# Patient Record
Sex: Female | Born: 1935 | Race: White | Hispanic: No | Smoking: Former smoker
Health system: Southern US, Community
[De-identification: ages and names within clinical notes are randomized; demographics above are authoritative.]

## PROBLEM LIST (undated history)

## (undated) DIAGNOSIS — R739 Hyperglycemia, unspecified: Secondary | ICD-10-CM

## (undated) DIAGNOSIS — I739 Peripheral vascular disease, unspecified: Secondary | ICD-10-CM

## (undated) DIAGNOSIS — N83209 Unspecified ovarian cyst, unspecified side: Secondary | ICD-10-CM

## (undated) DIAGNOSIS — L989 Disorder of the skin and subcutaneous tissue, unspecified: Secondary | ICD-10-CM

## (undated) DIAGNOSIS — E079 Disorder of thyroid, unspecified: Secondary | ICD-10-CM

## (undated) DIAGNOSIS — D649 Anemia, unspecified: Secondary | ICD-10-CM

## (undated) DIAGNOSIS — Z9289 Personal history of other medical treatment: Secondary | ICD-10-CM

## (undated) DIAGNOSIS — K5900 Constipation, unspecified: Secondary | ICD-10-CM

## (undated) DIAGNOSIS — J40 Bronchitis, not specified as acute or chronic: Principal | ICD-10-CM

## (undated) DIAGNOSIS — R1900 Intra-abdominal and pelvic swelling, mass and lump, unspecified site: Secondary | ICD-10-CM

## (undated) DIAGNOSIS — E669 Obesity, unspecified: Secondary | ICD-10-CM

## (undated) DIAGNOSIS — I482 Chronic atrial fibrillation, unspecified: Secondary | ICD-10-CM

## (undated) DIAGNOSIS — R0902 Hypoxemia: Secondary | ICD-10-CM

## (undated) DIAGNOSIS — C4490 Unspecified malignant neoplasm of skin, unspecified: Secondary | ICD-10-CM

## (undated) DIAGNOSIS — M1990 Unspecified osteoarthritis, unspecified site: Secondary | ICD-10-CM

## (undated) DIAGNOSIS — E785 Hyperlipidemia, unspecified: Secondary | ICD-10-CM

## (undated) DIAGNOSIS — B019 Varicella without complication: Secondary | ICD-10-CM

## (undated) DIAGNOSIS — I10 Essential (primary) hypertension: Secondary | ICD-10-CM

## (undated) DIAGNOSIS — G629 Polyneuropathy, unspecified: Secondary | ICD-10-CM

## (undated) DIAGNOSIS — G4730 Sleep apnea, unspecified: Secondary | ICD-10-CM

## (undated) DIAGNOSIS — E0590 Thyrotoxicosis, unspecified without thyrotoxic crisis or storm: Secondary | ICD-10-CM

## (undated) DIAGNOSIS — Z0000 Encounter for general adult medical examination without abnormal findings: Principal | ICD-10-CM

## (undated) DIAGNOSIS — J984 Other disorders of lung: Secondary | ICD-10-CM

## (undated) DIAGNOSIS — S8012XA Contusion of left lower leg, initial encounter: Secondary | ICD-10-CM

## (undated) DIAGNOSIS — T462X5A Adverse effect of other antidysrhythmic drugs, initial encounter: Secondary | ICD-10-CM

## (undated) DIAGNOSIS — K219 Gastro-esophageal reflux disease without esophagitis: Secondary | ICD-10-CM

## (undated) HISTORY — DX: Disorder of the skin and subcutaneous tissue, unspecified: L98.9

## (undated) HISTORY — DX: Anemia, unspecified: D64.9

## (undated) HISTORY — DX: Contusion of left lower leg, initial encounter: S80.12XA

## (undated) HISTORY — DX: Chronic atrial fibrillation, unspecified: I48.20

## (undated) HISTORY — DX: Obesity, unspecified: E66.9

## (undated) HISTORY — DX: Other disorders of lung: J98.4

## (undated) HISTORY — DX: Unspecified malignant neoplasm of skin, unspecified: C44.90

## (undated) HISTORY — DX: Intra-abdominal and pelvic swelling, mass and lump, unspecified site: R19.00

## (undated) HISTORY — DX: Sleep apnea, unspecified: G47.30

## (undated) HISTORY — DX: Adverse effect of other antidysrhythmic drugs, initial encounter: T46.2X5A

## (undated) HISTORY — DX: Constipation, unspecified: K59.00

## (undated) HISTORY — PX: CATARACT EXTRACTION: SUR2

## (undated) HISTORY — DX: Hyperlipidemia, unspecified: E78.5

## (undated) HISTORY — DX: Unspecified ovarian cyst, unspecified side: N83.209

## (undated) HISTORY — PX: ABDOMINAL HYSTERECTOMY: SHX81

## (undated) HISTORY — DX: Thyrotoxicosis, unspecified without thyrotoxic crisis or storm: E05.90

## (undated) HISTORY — DX: Encounter for general adult medical examination without abnormal findings: Z00.00

## (undated) HISTORY — DX: Personal history of other medical treatment: Z92.89

## (undated) HISTORY — DX: Disorder of thyroid, unspecified: E07.9

## (undated) HISTORY — DX: Polyneuropathy, unspecified: G62.9

## (undated) HISTORY — DX: Essential (primary) hypertension: I10

## (undated) HISTORY — DX: Gastro-esophageal reflux disease without esophagitis: K21.9

## (undated) HISTORY — DX: Hyperglycemia, unspecified: R73.9

## (undated) HISTORY — DX: Hypoxemia: R09.02

## (undated) HISTORY — DX: Bronchitis, not specified as acute or chronic: J40

## (undated) HISTORY — DX: Peripheral vascular disease, unspecified: I73.9

## (undated) HISTORY — PX: SKIN BIOPSY: SHX1

## (undated) HISTORY — DX: Unspecified osteoarthritis, unspecified site: M19.90

## (undated) HISTORY — DX: Varicella without complication: B01.9

---

## 1999-07-10 ENCOUNTER — Other Ambulatory Visit: Admission: RE | Admit: 1999-07-10 | Discharge: 1999-07-10 | Payer: Self-pay | Admitting: Family Medicine

## 2001-10-28 HISTORY — PX: TOTAL HIP ARTHROPLASTY: SHX124

## 2002-06-10 ENCOUNTER — Encounter: Payer: Self-pay | Admitting: Orthopedic Surgery

## 2002-06-17 ENCOUNTER — Inpatient Hospital Stay (HOSPITAL_COMMUNITY): Admission: RE | Admit: 2002-06-17 | Discharge: 2002-06-22 | Payer: Self-pay | Admitting: Orthopedic Surgery

## 2005-06-12 ENCOUNTER — Ambulatory Visit: Payer: Self-pay | Admitting: Family Medicine

## 2005-11-25 ENCOUNTER — Ambulatory Visit: Payer: Self-pay | Admitting: Family Medicine

## 2005-12-17 ENCOUNTER — Ambulatory Visit: Payer: Self-pay | Admitting: Family Medicine

## 2005-12-26 ENCOUNTER — Ambulatory Visit: Payer: Self-pay | Admitting: Family Medicine

## 2006-02-26 ENCOUNTER — Ambulatory Visit: Payer: Self-pay | Admitting: Family Medicine

## 2006-12-09 ENCOUNTER — Ambulatory Visit: Payer: Self-pay | Admitting: Internal Medicine

## 2007-01-28 ENCOUNTER — Ambulatory Visit: Payer: Self-pay | Admitting: Family Medicine

## 2007-02-04 ENCOUNTER — Encounter: Admission: RE | Admit: 2007-02-04 | Discharge: 2007-02-04 | Payer: Self-pay | Admitting: Family Medicine

## 2007-02-04 ENCOUNTER — Ambulatory Visit: Payer: Self-pay | Admitting: Family Medicine

## 2007-02-04 LAB — CONVERTED CEMR LAB
Basophils Absolute: 0.1 10*3/uL (ref 0.0–0.1)
CO2: 27 meq/L (ref 19–32)
Chloride: 106 meq/L (ref 96–112)
HCT: 44.6 % (ref 36.0–46.0)
MCHC: 34.5 g/dL (ref 30.0–36.0)
Monocytes Relative: 7.7 % (ref 3.0–11.0)
Potassium: 3.8 meq/L (ref 3.5–5.1)
RBC: 4.86 M/uL (ref 3.87–5.11)
RDW: 12.1 % (ref 11.5–14.6)
Sodium: 142 meq/L (ref 135–145)

## 2009-02-09 ENCOUNTER — Ambulatory Visit: Payer: Self-pay | Admitting: Family Medicine

## 2009-02-09 DIAGNOSIS — D649 Anemia, unspecified: Secondary | ICD-10-CM | POA: Insufficient documentation

## 2009-02-09 DIAGNOSIS — E785 Hyperlipidemia, unspecified: Secondary | ICD-10-CM | POA: Insufficient documentation

## 2009-02-09 DIAGNOSIS — M1990 Unspecified osteoarthritis, unspecified site: Secondary | ICD-10-CM

## 2009-02-09 DIAGNOSIS — E0590 Thyrotoxicosis, unspecified without thyrotoxic crisis or storm: Secondary | ICD-10-CM

## 2009-02-09 DIAGNOSIS — E559 Vitamin D deficiency, unspecified: Secondary | ICD-10-CM

## 2009-02-09 DIAGNOSIS — L259 Unspecified contact dermatitis, unspecified cause: Secondary | ICD-10-CM

## 2009-02-09 DIAGNOSIS — T50995A Adverse effect of other drugs, medicaments and biological substances, initial encounter: Secondary | ICD-10-CM

## 2009-02-09 DIAGNOSIS — E669 Obesity, unspecified: Secondary | ICD-10-CM

## 2009-02-09 DIAGNOSIS — R350 Frequency of micturition: Secondary | ICD-10-CM

## 2009-02-09 HISTORY — DX: Thyrotoxicosis, unspecified without thyrotoxic crisis or storm: E05.90

## 2009-02-09 LAB — HM MAMMOGRAPHY

## 2009-02-15 ENCOUNTER — Telehealth: Payer: Self-pay | Admitting: Family Medicine

## 2009-02-15 LAB — CONVERTED CEMR LAB
ALT: 14 units/L (ref 0–35)
BUN: 14 mg/dL (ref 6–23)
CO2: 26 meq/L (ref 19–32)
Chloride: 106 meq/L (ref 96–112)
Direct LDL: 128.4 mg/dL
Eosinophils Relative: 0.9 % (ref 0.0–5.0)
Glucose, Bld: 106 mg/dL — ABNORMAL HIGH (ref 70–99)
HCT: 42.8 % (ref 36.0–46.0)
Lymphs Abs: 0.9 10*3/uL (ref 0.7–4.0)
MCV: 93.4 fL (ref 78.0–100.0)
Monocytes Absolute: 0.5 10*3/uL (ref 0.1–1.0)
Platelets: 164 10*3/uL (ref 150.0–400.0)
Potassium: 4.4 meq/L (ref 3.5–5.1)
RDW: 12.2 % (ref 11.5–14.6)
TSH: 2.55 microintl units/mL (ref 0.35–5.50)
Total Bilirubin: 0.8 mg/dL (ref 0.3–1.2)
WBC: 5.2 10*3/uL (ref 4.5–10.5)

## 2009-03-08 ENCOUNTER — Encounter: Payer: Self-pay | Admitting: Family Medicine

## 2010-01-10 ENCOUNTER — Ambulatory Visit: Payer: Self-pay | Admitting: Family Medicine

## 2010-01-10 DIAGNOSIS — R739 Hyperglycemia, unspecified: Secondary | ICD-10-CM | POA: Insufficient documentation

## 2010-01-10 DIAGNOSIS — K219 Gastro-esophageal reflux disease without esophagitis: Secondary | ICD-10-CM | POA: Insufficient documentation

## 2010-01-10 HISTORY — DX: Hyperglycemia, unspecified: R73.9

## 2010-01-15 LAB — CONVERTED CEMR LAB
BUN: 24 mg/dL — ABNORMAL HIGH (ref 6–23)
Basophils Absolute: 0.2 10*3/uL — ABNORMAL HIGH (ref 0.0–0.1)
Calcium: 9.8 mg/dL (ref 8.4–10.5)
Creatinine, Ser: 1.2 mg/dL (ref 0.4–1.2)
GFR calc non Af Amer: 45.48 mL/min (ref >60)
Glucose, Bld: 157 mg/dL — ABNORMAL HIGH (ref 70–99)
Hemoglobin: 14.9 g/dL (ref 12.0–15.0)
Hgb A1c MFr Bld: 5.9 % (ref 4.6–6.5)
Lymphocytes Relative: 17.4 % (ref 12.0–46.0)
Monocytes Relative: 6.3 % (ref 3.0–12.0)
Neutrophils Relative %: 73.6 % (ref 43.0–77.0)
Platelets: 206 10*3/uL (ref 150.0–400.0)
RDW: 12.6 % (ref 11.5–14.6)
Sodium: 142 meq/L (ref 135–145)

## 2010-03-23 ENCOUNTER — Ambulatory Visit: Payer: Self-pay | Admitting: Family Medicine

## 2010-03-23 DIAGNOSIS — M1990 Unspecified osteoarthritis, unspecified site: Secondary | ICD-10-CM

## 2010-03-23 DIAGNOSIS — N390 Urinary tract infection, site not specified: Secondary | ICD-10-CM | POA: Insufficient documentation

## 2010-03-23 LAB — CONVERTED CEMR LAB
Bilirubin Urine: NEGATIVE
Ketones, urine, test strip: NEGATIVE
Specific Gravity, Urine: <1.005

## 2010-03-24 ENCOUNTER — Encounter: Payer: Self-pay | Admitting: Family Medicine

## 2010-03-27 ENCOUNTER — Telehealth (INDEPENDENT_AMBULATORY_CARE_PROVIDER_SITE_OTHER): Payer: Self-pay

## 2010-04-04 ENCOUNTER — Telehealth: Payer: Self-pay | Admitting: Family Medicine

## 2010-04-05 ENCOUNTER — Ambulatory Visit: Payer: Self-pay | Admitting: Family Medicine

## 2010-04-05 DIAGNOSIS — N362 Urethral caruncle: Secondary | ICD-10-CM | POA: Insufficient documentation

## 2010-04-05 DIAGNOSIS — R1900 Intra-abdominal and pelvic swelling, mass and lump, unspecified site: Secondary | ICD-10-CM

## 2010-04-05 DIAGNOSIS — N898 Other specified noninflammatory disorders of vagina: Secondary | ICD-10-CM | POA: Insufficient documentation

## 2010-04-05 LAB — CONVERTED CEMR LAB
Bilirubin Urine: NEGATIVE
Glucose, Urine, Semiquant: NEGATIVE
Urobilinogen, UA: 1
pH: 6

## 2010-04-06 ENCOUNTER — Telehealth: Payer: Self-pay | Admitting: Family Medicine

## 2010-04-06 ENCOUNTER — Ambulatory Visit: Payer: Self-pay | Admitting: Family Medicine

## 2010-04-09 ENCOUNTER — Ambulatory Visit: Payer: Self-pay | Admitting: Cardiology

## 2010-04-10 LAB — CONVERTED CEMR LAB
BUN: 22 mg/dL (ref 6–23)
Calcium: 10.2 mg/dL (ref 8.4–10.5)
Creatinine, Ser: 0.9 mg/dL (ref 0.4–1.2)
Glucose, Bld: 94 mg/dL (ref 70–99)
Sodium: 145 meq/L (ref 135–145)

## 2010-04-11 LAB — CONVERTED CEMR LAB: Potassium: 5.5 meq/L — ABNORMAL HIGH (ref 3.5–5.1)

## 2010-04-12 ENCOUNTER — Telehealth: Payer: Self-pay | Admitting: Family Medicine

## 2010-04-16 ENCOUNTER — Ambulatory Visit: Payer: Self-pay | Admitting: Family Medicine

## 2010-05-07 ENCOUNTER — Telehealth: Payer: Self-pay | Admitting: Family Medicine

## 2010-05-09 ENCOUNTER — Ambulatory Visit: Admission: RE | Admit: 2010-05-09 | Discharge: 2010-05-09 | Payer: Self-pay | Admitting: Gynecology

## 2010-05-09 ENCOUNTER — Encounter: Payer: Self-pay | Admitting: Family Medicine

## 2010-05-10 ENCOUNTER — Ambulatory Visit (HOSPITAL_COMMUNITY): Admission: RE | Admit: 2010-05-10 | Discharge: 2010-05-10 | Payer: Self-pay | Admitting: Gynecology

## 2010-05-10 ENCOUNTER — Encounter: Payer: Self-pay | Admitting: Family Medicine

## 2010-05-16 ENCOUNTER — Telehealth: Payer: Self-pay | Admitting: Family Medicine

## 2010-08-14 ENCOUNTER — Inpatient Hospital Stay (HOSPITAL_COMMUNITY): Admission: EM | Admit: 2010-08-14 | Discharge: 2010-08-17 | Payer: Self-pay | Admitting: Emergency Medicine

## 2010-08-14 ENCOUNTER — Ambulatory Visit: Payer: Self-pay | Admitting: Family Medicine

## 2010-08-14 ENCOUNTER — Ambulatory Visit: Payer: Self-pay | Admitting: Internal Medicine

## 2010-08-14 ENCOUNTER — Encounter: Payer: Self-pay | Admitting: Family Medicine

## 2010-08-14 DIAGNOSIS — I4891 Unspecified atrial fibrillation: Secondary | ICD-10-CM

## 2010-08-15 ENCOUNTER — Encounter: Payer: Self-pay | Admitting: Cardiology

## 2010-08-30 ENCOUNTER — Encounter: Payer: Self-pay | Admitting: Cardiology

## 2010-08-30 ENCOUNTER — Ambulatory Visit: Payer: Self-pay | Admitting: Cardiology

## 2010-10-12 ENCOUNTER — Ambulatory Visit: Payer: Self-pay | Admitting: Cardiology

## 2010-10-12 ENCOUNTER — Encounter: Payer: Self-pay | Admitting: Cardiology

## 2010-10-15 ENCOUNTER — Telehealth: Payer: Self-pay | Admitting: Cardiology

## 2010-10-16 ENCOUNTER — Telehealth: Payer: Self-pay | Admitting: Cardiology

## 2010-10-16 LAB — CONVERTED CEMR LAB
AST: 19 units/L (ref 0–37)
Albumin: 4.1 g/dL (ref 3.5–5.2)
BUN: 22 mg/dL (ref 6–23)
Calcium: 9.1 mg/dL (ref 8.4–10.5)
Chloride: 103 meq/L (ref 96–112)
Creatinine, Ser: 0.97 mg/dL (ref 0.40–1.20)
Free T4: 1.39 ng/dL (ref 0.80–1.80)
Glucose, Bld: 82 mg/dL (ref 70–99)
Potassium: 4.9 meq/L (ref 3.5–5.3)
TSH: 3.229 microintl units/mL (ref 0.350–4.500)

## 2010-10-19 DIAGNOSIS — I498 Other specified cardiac arrhythmias: Secondary | ICD-10-CM

## 2010-10-26 ENCOUNTER — Telehealth (INDEPENDENT_AMBULATORY_CARE_PROVIDER_SITE_OTHER): Payer: Self-pay | Admitting: *Deleted

## 2010-10-31 ENCOUNTER — Ambulatory Visit
Admission: RE | Admit: 2010-10-31 | Discharge: 2010-10-31 | Payer: Self-pay | Source: Home / Self Care | Attending: Cardiology | Admitting: Cardiology

## 2010-10-31 ENCOUNTER — Encounter: Payer: Self-pay | Admitting: Cardiology

## 2010-11-06 ENCOUNTER — Telehealth: Payer: Self-pay | Admitting: Cardiology

## 2010-11-16 ENCOUNTER — Other Ambulatory Visit: Payer: Self-pay | Admitting: Gynecology

## 2010-11-16 DIAGNOSIS — N83201 Unspecified ovarian cyst, right side: Secondary | ICD-10-CM

## 2010-11-29 NOTE — Progress Notes (Signed)
----   Converted from flag ---- ---- 03/23/2010 11:55 AM, Raechel Ache, RN wrote: phone # next week for results [phone] ------------------------------

## 2010-11-29 NOTE — Procedures (Signed)
Summary: SUMMARY REPORT  SUMMARY REPORT   Imported By: Mirna Mires 11/06/2010 16:25:52  _____________________________________________________________________  External Attachment:    Type:   Image     Comment:   External Document

## 2010-11-29 NOTE — Progress Notes (Signed)
Summary: question re tylenol  Phone Note Call from Patient   Caller: Patient [phone] Reason for Call: Talk to Nurse Summary of Call: pt wants to know if ok to take tylenol Initial call taken by: Glynda Jaeger,  October 16, 2010 8:23 AM  Follow-up for Phone Call        yes. Follow-up by: Gaylord Shih, MD, [facility],  October 16, 2010 1:42 PM     Appended Document: question re tylenol Pt aware okay to take Tylenol. Mylo Red RN

## 2010-11-29 NOTE — Assessment & Plan Note (Signed)
Summary: BLADDER INFECTION? // RS   Vital Signs:  Patient profile:   77 year old female Weight:      213 pounds O2 Sat:      97124 % Temp:     98.1 degrees F Pulse rate:   60 / minute BP sitting:   124 / 86  (left arm)  Vitals Entered By: Pura Spice, RN (April 05, 2010 11:51 AM) CC: saw  small amt vaginal blood yest. UA otained today stated was treated by dr fry last month for UTI .      History of Present Illness: This 77 year old widow was seen by Dr. Clent Ridges last month or UTI as well as noticed some vaginal spotting at the time treated with Septra DS. Since that time she has had 2 episodes of vaginal spotting no pain but in to be examined for followup of urinary tract infection and cause of spotting Her leg she had cataract surgery one day ago and doing fine No other complaints Patient has mild pain medicines other than vitamins  Allergies (verified): No Known Drug Allergies  Past History:  Past Medical History: Last updated: 03/23/2010 Anemia-NOS GERD Hyperlipidemia Hypothyroidism Osteoarthritis hypoglycemia vitamin D deficiency  Past Surgical History: Last updated: 03/23/2010 Cataract extraction per Dr. Burgess Estelle  Review of Systems  The patient denies anorexia, fever, weight loss, weight gain, vision loss, decreased hearing, hoarseness, chest pain, syncope, dyspnea on exertion, peripheral edema, prolonged cough, headaches, hemoptysis, abdominal pain, melena, hematochezia, severe indigestion/heartburn, hematuria, incontinence, genital sores, muscle weakness, suspicious skin lesions, transient blindness, difficulty walking, depression, unusual weight change, abnormal bleeding, enlarged lymph nodes, angioedema, breast masses, and testicular masses.    Physical Exam  General:  Well-developed,well-nourished,in no acute distress; alert,appropriate and cooperative throughout examinationoverweight-appearing.   Lungs:  Normal respiratory effort, chest expands symmetrically.  Lungs are clear to auscultation, no crackles or wheezes. Heart:  Normal rate and regular rhythm. S1 and S2 normal without gallop, murmur, click, rub or other extra sounds. Abdomen:  Bowel sounds positive,abdomen soft and non-tender without masses, organomegaly or hernias noted. Rectal:  No external abnormalities noted. Normal sphincter tone. No rectal masses or tenderness. 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 14 cm No evidence of vaginal bleeding other than a caruncle of urethra   Impression & Recommendations:  Problem # 1:  URETHRAL CARUNCLE (ICD-599.3) Assessment New most likely source of bleeding, no treatment at this time  Problem # 2:  PELVIC MASS (ICD-789.30) Assessment: New approximate 10 x 14 cm mass right pelvis  Problem # 3:  UTI (ICD-599.0) Assessment: Improved  Orders: UA Dipstick w/o Micro (automated)  (81003) Venipuncture (16109) TLB-Renal Function Panel (80069-RENAL)  Problem # 4:  VAGINAL BLEEDING (ICD-623.8) Assessment: New spotting secondary to urethral caruncle  Complete Medication List: 1)  Multivitamins Tabs (Multiple vitamin) .... Once daily 2)  Adult Aspirin Low Strength 81 Mg Tbdp (Aspirin) 3)  Vitamin D 60454 Unit Caps (Ergocalciferol) .Marland Kitchen.. 1 by mouth weekly  Other Orders: TLB-Potassium (K+) (09811-B) Radiology Referral (Radiology)  Patient Instructions: 1)  urinary tract infection has improved and no further treatment is needed 2)  On pelvic examination you have a pelvic mass which will get a CT scan to determine further treatment 3)  Vaginal spotting was most likely due to the urethral  caruncle 4)  which needs no treatment at this time but will be evaluated by the gynecologist  Laboratory  Results   Urine Tests    Routine Urinalysis   Color: yellow Appearance: Clear Glucose: negative   (Normal Range: Negative) Bilirubin: negative    (Normal Range: Negative) Ketone: trace (5)   (Normal Range: Negative) Spec. Gravity: 1.020   (Normal Range: 1.003-1.035) Blood: negative   (Normal Range: Negative) pH: 6.0   (Normal Range: 5.0-8.0) Protein: negative   (Normal Range: Negative) Urobilinogen: 1.0   (Normal Range: 0-1) Nitrite: negative   (Normal Range: Negative) Leukocyte Esterace: trace   (Normal Range: Negative)    Comments: Rita Ohara  April 05, 2010 11:10 AM

## 2010-11-29 NOTE — Progress Notes (Signed)
Summary: wiped blood again  Phone Note Call from Patient Call back at Home Phone [phone]   Caller: vm Summary of Call: Antibiotic from Dr. Clent Ridges when Dr. Scotty Court out UTI/bladder infection, at that time, saw some blood when wiped,  finished Bactrim 04-02-10.  Had cataract surgery this am.  This am wiped blood again.  What to do? Initial call taken by: Rudy Jew, RN,  April 04, 2010 2:46 PM  Follow-up for Phone Call        Contacted pt and scheduled appt for pt to come in for evaluation... Pt adv that she wipes blood (small amt) in the morning and doesn't know if it is connected to the recent UTI / Bladder Infection that she  just finished taking abx for? Denies any other sxs.... Per Almira Coaster, RN, pt has been scheduled for appt with Dr Scotty Court on 04/05/2010 at 10:30am.  Follow-up by: Debbra Riding,  April 04, 2010 3:53 PM

## 2010-11-29 NOTE — Progress Notes (Signed)
Summary: Question about  medication  Phone Note Call from Patient Call back at Home Phone [phone]   Caller: Patient Summary of Call: Quiestion about medication making her dizzy Initial call taken by: Judie Grieve,  October 15, 2010 2:23 PM  Follow-up for Phone Call        spoke w/ pt and she adv that she started 12.5 metropolol two times a day this past Saturday. (note this is a decrease).  On Thursday morning while walking from car to get hair done she had this dizzy type feeling that lasted about an hour.  Then Friday night she got up to used the bathroom and had the same sensation and required her to hold onto the wall. This morning she had the sensation a little bit.  She is not overly concerned about this but wants the Doctor to be aware.  She has read that this sensation can be caused by inner ear and left her hearing aid out last week before this happened.  Encouraged pt to put it back in.  Adv pt that if this becomes more frequent to let us know and that Dr. Daleen Squibb will see this note and provide further guidance.  Follow-up by: Claris Gladden RN,  October 15, 2010 2:48 PM     Appended Document: Question about  medication continue to monitor. If continues will need 24 hr HM. Was bradycardic in office and I decreased her metoprolol.  Appended Document: Question about  medication I talked with Mrs. Gerlich and she felt dizzy most of the day today.  She also states she has a cold and feels achy all over.  She is aware it is okay to take Tylenol.  Reviewed orthostatic precautions with her as well as need to keep hydrated and eat small meals.  She is taking all of her medications.   She will call back later this week if dizziness persists.  I will also call her back this week and see how she is doing. Mylo Red RN  Appended Document: Question about  medication I called Mrs. Groseclose today and she states that "I'm not really feeling any better. I am ok sitting. When I stand to start  walking it gets better".  Discussed 24' Holter Monitor with patient.  She would like to wait until after Christmas as her family is in for the holidays.  Agreed and order placed. Mylo Red RN   Clinical Lists Changes  Problems: Added new problem of BRADYCARDIA (ICD-427.89) Orders: Added new Referral order of Holter Monitor (Holter Monitor) - Signed

## 2010-11-29 NOTE — Progress Notes (Signed)
Summary: pelvic mass and oncology referral  Phone Note Call from Patient   Caller: Patient Call For: Judithann Sheen MD Summary of Call: Pt is asking for directions and instructions about Dr. Loree Fee that she is to be referred to for pelvic mass???? [phone] Initial call taken by: Lynann Beaver CMA,  April 12, 2010 11:41 AM  Follow-up for Phone Call        Spoke with pt and she states that Dr. Scotty Court wanted a certain md. I couldn't find him in my book. So I told her Dr. Scotty Court is out of the office until Tues and that I would perfer him to do this since he wants a certain md. Pt agrees. Follow-up by: Romualdo Bolk, CMA Duncan Dull),  April 12, 2010 11:52 AM  Additional Follow-up for Phone Call Additional follow up Details #1::        Pt notified Dr Marjorie Smolder [facility]  Additional Follow-up by: Pura Spice, RN,  April 16, 2010 8:51 AM

## 2010-11-29 NOTE — Progress Notes (Signed)
Summary: Pt req to talk to Dr. Scotty Court re: Oncology Referral  Phone Note Call from Patient Call back at [phone] cell    Caller: Patient Summary of Call: Pt called and desperately needs to talk with Dr. Scotty Court re: Oncology referral. Pls call on pts cell phone.  [phone] cell .  Pts home # is order of order at this time.    Initial call taken by: Lucy Antigua,  May 07, 2010 10:05 AM  Follow-up for Phone Call        called pt and discussed  problem Follow-up by: Judithann Sheen MD,  May 16, 2010 8:29 AM

## 2010-11-29 NOTE — Assessment & Plan Note (Signed)
Summary: FATIGUE / BODY ACHES // RS   Vital Signs:  Patient profile:   77 year old female Weight:      213 pounds O2 Sat:      98 % Temp:     97.9 degrees F Pulse rate:   96 / minute BP sitting:   120 / 84  (left arm) Cuff size:   large  Vitals Entered By: Pura Spice, RN (January 10, 2010 3:23 PM) CC: not feeling weell reflux last hs gets "catch" in rt ankle thinks not adjusted to new hearing aid    History of Present Illness: Is a 53-year-old white female with normal blood pressures and complaining of not feeling well. She relates she had severe reflux last evening and that some of the past year she also states she gets some type of word pain in her right ankle but is of short duration and infrequent. She has recently gotten a new urinary aid for the right ear and is having some difficulty with the She relates no energy also states that she has had some pain in the right shoulder since there was worry as certain times cannot raise her arm but it is some better at this time She states she feels fine he at times and wonders if she has hypoglycemia so will check a blood sugar as well as a hemoglobin A1c the sensation of her low same he occurs after ingestion of some sweets as candy, possibly functional hypoglycemia  Allergies (verified): No Known Drug Allergies  Review of Systems  The patient denies anorexia, fever, weight loss, weight gain, vision loss, decreased hearing, hoarseness, chest pain, syncope, dyspnea on exertion, peripheral edema, prolonged cough, headaches, hemoptysis, abdominal pain, melena, hematochezia, severe indigestion/heartburn, hematuria, incontinence, genital sores, muscle weakness, suspicious skin lesions, transient blindness, difficulty walking, depression, unusual weight change, abnormal bleeding, enlarged lymph nodes, angioedema, breast masses, and testicular masses.    Physical Exam  General:  Well-developed,well-nourished,in no acute distress;  alert,appropriate and cooperative throughout examination Zylstra:  Normocephalic and atraumatic without obvious abnormalities. No apparent alopecia or balding. Eyes:  No corneal or conjunctival inflammation noted. EOMI. Perrla. Funduscopic exam benign, without hemorrhages, exudates or papilledema. Vision grossly normal. Ears:  hearing aid right ear left ear mold Nose:  External nasal examination shows no deformity or inflammation. Nasal mucosa are pink and moist without lesions or exudates. Mouth:  Oral mucosa and oropharynx without lesions or exudates.  Teeth in good repair. Lungs:  Normal respiratory effort, chest expands symmetrically. Lungs are clear to auscultation, no crackles or wheezes. Heart:  Normal rate and regular rhythm. S1 and S2 normal without gallop, murmur, click, rub or other extra sounds. Abdomen:  Bowel sounds positive,abdomen soft and non-tender without masses, organomegaly or hernias noted. Extremities:  No clubbing, cyanosis, edema, or deformity noted with normal full range of motion of all joints.   Neurologic:  No cranial nerve deficits noted. Station and gait are normal. Plantar reflexes are down-going bilaterally. DTRs are symmetrical throughout. Sensory, motor and coordinative functions appear intact.   Impression & Recommendations:  Problem # 1:  GERD (ICD-530.81) Assessment New  Her updated medication list for this problem includes:    Nexium 40 Mg Cpdr (Esomeprazole magnesium) ..... One q.d.  Problem # 2:  HYPOGLYCEMIA (ICD-251.2) Assessment: New  Orders: Venipuncture (16109) TLB-BMP (Basic Metabolic Panel-BMET) (80048-METABOL) TLB-A1C / Hgb A1C (Glycohemoglobin) (83036-A1C)  Problem # 3:  EXOGENOUS OBESITY (ICD-278.00) Assessment: Unchanged  Problem # 4:  ARTHRITIS (ICD-716.90) Assessment: Unchanged  Complete Medication List: 1)  Multivitamins Tabs (Multiple vitamin) .... Once daily 2)  Adult Aspirin Low Strength 81 Mg Tbdp (Aspirin) 3)  Vitamin D  50000 Unit Caps (Ergocalciferol) .Marland Kitchen.. 1 by mouth weekly 4)  Nexium 40 Mg Cpdr (Esomeprazole magnesium) .... One q.d.  Other Orders: T-Vitamin D (25-Hydroxy) [phone]) TLB-CBC Platelet - w/Differential (85025-CBCD) Prescription Created Electronically [phone])  Patient Instructions: 1)  plan to get laboratory studies 2)  Discussed importance of not ingesting candy or other sweets on i 3)  empty stomach 4)  Will check lab studies and call you when I receive the results Prescriptions: VITAMIN D 40102 UNIT CAPS (ERGOCALCIFEROL) 1 by mouth weekly  #12 x 3   Entered and Authorized by:   Judithann Sheen MD   Signed by:   Judithann Sheen MD on 01/10/2010   Method used:   [facility] to        [facility] [address] [phone]* (retail)       [address]       [place], [place]  [address]       Ph: [phone] or [phone]       Fax: [phone]   RxID:   [id number]

## 2010-11-29 NOTE — Progress Notes (Signed)
Summary: [facility] called re: order for potassium lvl  Phone Note From Other Clinic   Caller: [facility]             Summary of Call: Pt came to [facility] on Friday 04/06/10, stating she was to have potassium lvl checked per Dr. Scotty Court. There was no order found for this lab work to be done. Please advise.     Initial call taken by: Lucy Antigua,  April 06, 2010 8:34 AM  Follow-up for Phone Call        Per Dr. Scotty Court reorder potassium since it was elevated yesterday. Follow-up by: Trixie Dredge,  April 06, 2010 12:58 PM

## 2010-11-29 NOTE — Assessment & Plan Note (Signed)
Summary: UTI? // RS   Vital Signs:  Patient profile:   77 year old female Weight:      211 pounds BMI:     33.17 Temp:     97.5 degrees F oral Pulse rate:   52 / minute Pulse rhythm:   irregular BP sitting:   140 / 90  (left arm) Cuff size:   regular  Vitals Entered By: Raechel Ache, RN (Mar 23, 2010 9:28 AM) CC: C/o dysuria, urgency, saw blood in urine this morning.Planning on leaving town tomorrow.   History of Present Illness: Here for 2 days of urinary burning and urgency, along with some blood in the urine. No fever or nasuea or abdominal pain.   Allergies (verified): No Known Drug Allergies  Past History:  Past Medical History: Anemia-NOS GERD Hyperlipidemia Hypothyroidism Osteoarthritis hypoglycemia vitamin D deficiency  Past Surgical History: Cataract extraction per Dr. Burgess Estelle  Review of Systems  The patient denies anorexia, fever, weight loss, weight gain, vision loss, decreased hearing, hoarseness, chest pain, syncope, dyspnea on exertion, peripheral edema, prolonged cough, headaches, hemoptysis, abdominal pain, melena, hematochezia, severe indigestion/heartburn, hematuria, incontinence, genital sores, muscle weakness, suspicious skin lesions, transient blindness, difficulty walking, depression, unusual weight change, abnormal bleeding, enlarged lymph nodes, angioedema, breast masses, and testicular masses.    Physical Exam  General:  Well-developed,well-nourished,in no acute distress; alert,appropriate and cooperative throughout examination Lungs:  Normal respiratory effort, chest expands symmetrically. Lungs are clear to auscultation, no crackles or wheezes. Heart:  Normal rate and regular rhythm. S1 and S2 normal without gallop, murmur, click, rub or other extra sounds. Abdomen:  Bowel sounds positive,abdomen soft and non-tender without masses, organomegaly or hernias noted.   Impression & Recommendations:  Problem # 1:  UTI (ICD-599.0)  Her  updated medication list for this problem includes:    Macrobid 100 Mg Caps (Nitrofurantoin monohyd macro) .Marland Kitchen..Marland Kitchen Two times a day  Orders: Prescription Created Electronically [id number]) UA Dipstick w/o Micro (manual) (38756) T-Culture, Urine ([id number])  Complete Medication List: 1)  Multivitamins Tabs (Multiple vitamin) .... Once daily 2)  Adult Aspirin Low Strength 81 Mg Tbdp (Aspirin) 3)  Vitamin D 16606 Unit Caps (Ergocalciferol) .Marland Kitchen.. 1 by mouth weekly 4)  Macrobid 100 Mg Caps (Nitrofurantoin monohyd macro) .... Two times a day  Patient Instructions: 1)  Await culture results. Drink plenty of water Prescriptions: MACROBID 100 MG CAPS (NITROFURANTOIN MONOHYD MACRO) two times a day  #14 x 0   Entered and Authorized by:   Nelwyn Salisbury MD   Signed by:   Nelwyn Salisbury MD on 03/23/2010   Method used:   [facility]        [facility] [address] [phone]* (retail)       [address]       [place], [place]  [address]       Ph: [phone] or [phone]       Fax: [phone]   RxID:   [id number]   Laboratory Results   Urine Tests    Routine Urinalysis   Color: lt. yellow Appearance: Cloudy Glucose: negative   (Normal Range: Negative) Bilirubin: negative   (Normal Range: Negative) Ketone: negative   (Normal Range: Negative) Spec. Gravity: <1.005   (Normal Range: 1.003-1.035) Blood: large   (Normal Range: Negative) pH: 7.5   (Normal Range: 5.0-8.0) Protein: 30   (Normal Range: Negative) Urobilinogen: 0.2   (Normal Range: 0-1) Nitrite: negative   (Normal Range: Negative)  Leukocyte Esterace: negative   (Normal Range: Negative)

## 2010-11-29 NOTE — Progress Notes (Signed)
Summary: holter monitor  Phone Note Outgoing Call   Call placed by: Marcos Eke,  October 26, 2010 11:13 AM Action Taken: Appt scheduled Summary of Call: Patient has appt. for holter monitor on 1/4 at 12 noon

## 2010-11-29 NOTE — Assessment & Plan Note (Signed)
Summary: f4-6w/dfg   Visit Type:  1 mo f/u Primary Provider:  Judithann Sheen MD  CC:  edema/ankles...denies any other cardiac complaints today.  History of Present Illness: Mrs Gina Reilly comes in today for followup of her atrial fibrillation and bradycardia.  A last visit, her heart rate was in the high 40s. She was in sinus bradycardia. We decreased her metoprolol to 25 mg twice a day.  She still had a little bit of fatigue particularly she walks. She denies any dyspnea on exertion, palpitations, syncope or presyncope. She's had no bleeding. She has been on aspirin for a number of years for preventative reasons.  Current Medications (verified): 1)  Multivitamins  Tabs (Multiple Vitamin) .... Once Daily 2)  Aspirin 81 Mg Tbec (Aspirin) .... Take One Tablet By Mouth Daily 3)  Vitamin D 04540 Unit Caps (Ergocalciferol) .Marland Kitchen.. 1 By Mouth Weekly 4)  Amiodarone Hcl 200 Mg Tabs (Amiodarone Hcl) .Marland Kitchen.. 1 Tablet Daily 5)  Metoprolol Tartrate 25 Mg Tabs (Metoprolol Tartrate) .... Take One Tablet By Mouth Twice A Day 6)  Pradaxa 150 Mg Caps (Dabigatran Etexilate Mesylate) .Marland Kitchen.. 1 Cap Two Times A Day  Allergies (verified): No Known Drug Allergies  Past History:  Past Medical History: Last updated: 03/23/2010 Anemia-NOS GERD Hyperlipidemia Hypothyroidism Osteoarthritis hypoglycemia vitamin D deficiency  Past Surgical History: Last updated: 08/29/2010 Cataract extraction per Dr. Burgess Estelle Hip Arthroplasty-Total..2003  Family History: Last updated: 08/29/2010  Negative for atrial fibrillation.  She does have an   extensive history of stroke in her family.   Social History: Last updated: 08/29/2010  The patient lives at home alone by herself.  She has a   10 pack-year history of smoking but quit a long time ago.  She drinks  alcohol on very rare occasions approximately less than 1 time per month  Review of Systems       negative other than history of present illness  Vital  Signs:  Patient profile:   77 year old female Height:      67 inches Weight:      224 pounds BMI:     35.21 Pulse rate:   56 / minute Pulse rhythm:   irregular BP sitting:   150 / 90  (left arm) Cuff size:   large  Vitals Entered By: Danielle Rankin, CMA (October 12, 2010 4:19 PM)  Physical Exam  General:  obese.  no acute distress Eichler:  normocephalic and atraumatic Eyes:  PERRLA/EOM intact; conjunctiva and lids normal. Neck:  Neck supple, no JVD. No masses, thyromegaly or abnormal cervical nodes. Chest Wall:  no deformities or breast masses noted Lungs:  Clear bilaterally to auscultation and percussion. Heart:  PMI poorly appreciated, regular rate and rhythm, normal S1-S2, no carotid bruits Msk:  Back normal, normal gait. Muscle strength and tone normal. Pulses:  pulses normal in all 4 extremities Extremities:  No clubbing or cyanosis. Neurologic:  Alert and oriented x 3. Skin:  Intact without lesions or rashes. Psych:  Normal affect.   Impression & Recommendations:  Problem # 1:  ATRIAL FIBRILLATION (ICD-427.31) Assessment Improved She is still slow. We'll decrease her metoprolol to 12.5 mg twice a day. Continue amiodarone at 200 mg a day. We'll check amiodarone labs today.Followup in 3 months. We'll discontinue aspirin. The following medications were removed from the medication list:    Aspirin 81 Mg Tbec (Aspirin) .Marland Kitchen... Take one tablet by mouth daily Her updated medication list for this problem includes:    Amiodarone Hcl 200  Mg Tabs (Amiodarone hcl) .Marland Kitchen... 1 tablet daily    Metoprolol Tartrate 25 Mg Tabs (Metoprolol tartrate) .Marland Kitchen... Take one-half of a  tablet by mouth twice a day  Orders: T-TSH ([id number]) T-Comprehensive Metabolic Panel [phone]) T-T4, Free [phone])  Patient Instructions: 1)  Your physician recommends that you schedule a follow-up appointment in: 3 months with Dr. Daleen Squibb 2)  Your physician recommends that you have  lab work today:  cmp, tsh,  Free T4 3)  Your physician has recommended you make the following change in your medication:

## 2010-11-29 NOTE — Progress Notes (Signed)
  Phone Note Outgoing Call   Call placed by: Lisabeth Devoid RN,  November 06, 2010 10:41 AM Call placed to: Patient Summary of Call: holter monitor results  Follow-up for Phone Call        Pt will decrease Metoprolol to 12.5mg  qam. She is aware of Holter monitor results. Mylo Red RN Follow-up by: Lisabeth Devoid RN,  November 06, 2010 10:44 AM    New/Updated Medications: METOPROLOL TARTRATE 25 MG TABS (METOPROLOL TARTRATE) Take one-half of a  tablet by mouth every am

## 2010-11-29 NOTE — Assessment & Plan Note (Signed)
Summary: ACID REFLUX // RS   Vital Signs:  Patient profile:   77 year old female Height:      67 inches (170.18 cm) Weight:      218 pounds (99.09 kg) O2 Sat:      98 % on Room air Temp:     97.5 degrees F (36.39 degrees C) oral Pulse rate:   117 / minute BP sitting:   116 / 72  (left arm) Cuff size:   large  Vitals Entered By: Josph Macho RMA (August 14, 2010 3:24 PM)  O2 Flow:  Room air  Serial Vital Signs/Assessments:  Time      Position  BP       Pulse  Resp  Temp     By                              76                    Danise Edge MD                              145                   Danise Edge MD  CC: Acid reflux/ CF Is Patient Diabetic? No   History of Present Illness: Patient in today c/o worsening reflux symptoms. Last week she woke up with reflux symptoms last week and has changed from Rantidine to  Prilosec to improve her dyspepsia this week without any results. She describes it as he has burning in her throat and some belching. She denies actual chest pain. She denies feeling palpitations most of the time although she does note occasional sensation of her heart beating faster and does agree she felt that in the office today. She denies any recent illness, fevers, chills, nausea, vomiting, constipation, diarrhea. She does note her stools recently turned from a typical brown to more of a yellowish color but no other changes associated she is now experiencing anorexia, worsening shortness of breath, diaphoresis. She denies ever being told she was in an irregular rhythm before and has not had a cardiac Pap today  Current Medications (verified): 1)  Multivitamins  Tabs (Multiple Vitamin) .... Once Daily 2)  Adult Aspirin Low Strength 81 Mg Tbdp (Aspirin) 3)  Vitamin D 16109 Unit Caps (Ergocalciferol) .Marland Kitchen.. 1 By Mouth Weekly 4)  Prilosec .... As Directed  Allergies (verified): No Known Drug Allergies  Past History:  Past Medical History: Last updated:  03/23/2010 Anemia-NOS GERD Hyperlipidemia Hypothyroidism Osteoarthritis hypoglycemia vitamin D deficiency  Past Surgical History: Last updated: 03/23/2010 Cataract extraction per Dr. Burgess Estelle PMH-FH-SH reviewed-no changes except otherwise noted  Review of Systems      See HPI  Physical Exam  General:  Well-developed,well-nourished,in no acute distress; alert,appropriate and cooperative throughout examination [place]:  Normocephalic and atraumatic without obvious abnormalities. No apparent alopecia or balding. Mouth:  Oral mucosa and oropharynx without lesions or exudates.  Teeth in good repair. Lungs:  Normal respiratory effort, chest expands symmetrically. Lungs are clear to auscultation, no crackles or wheezes. Heart:  tachycardia, irregular rhythm, and grade  1/6 systolic murmur.   Abdomen:  Bowel sounds positive,abdomen soft and non-tender without masses, organomegaly or hernias noted. Extremities:  No clubbing, cyanosis, edema, or deformity noted with normal full range of motion of all joints.  Psych:  Cognition and judgment appear intact. Alert and cooperative with normal attention span and concentration. No apparent delusions, illusions, hallucinations   Impression & Recommendations:  Problem # 1:  ATRIAL FIBRILLATION (ICD-427.31)  Her updated medication list for this problem includes:    Adult Aspirin Low Strength 81 Mg Tbdp (Aspirin) Given her aspirin in the office since she did not take her usual dose today. She is presently asymptomatic but she is in Afib with RVR. She is sent to the ER at [facility] for further evaluation and rate control  Problem # 2:  GERD (ICD-530.81) Will reassess this after hospitalization. No change in meds at present.  Complete Medication List: 1)  Multivitamins Tabs (Multiple vitamin) .... Once daily 2)  Adult Aspirin Low Strength 81 Mg Tbdp (Aspirin) 3)  Vitamin D 16109 Unit Caps (Ergocalciferol) .Marland Kitchen.. 1 by mouth weekly 4)  Prilosec  .... As  directed  Appended Document: ACID REFLUX // RS    Clinical Lists Changes  Orders: Added new Service order of EKG w/ Interpretation (93000) - Signed

## 2010-11-29 NOTE — Assessment & Plan Note (Signed)
Summary: eph   Visit Type:  Initial Consult  CC:  "just tired".  History of Present Illness: Gina Reilly returns for followup of her new onset atrial fibrillation.  She had symptoms of palpitations when she presented. She underwent a transesophageal echocardiogram with cardioversion. She went right back into atrial fib. We loaded her on amiodarone. She is also on Pradaxa.  Her biggest complaint is lack of energy and stamina. She feels like she has led in her feet when she tries to walk. She has had no presyncope or syncope. She denies any bleeding or melena. She has not felt like she's been back in atrial fib  Current Medications (verified): 1)  Multivitamins  Tabs (Multiple Vitamin) .... Once Daily 2)  Aspirin 81 Mg Tbec (Aspirin) .... Take One Tablet By Mouth Daily 3)  Vitamin D 16109 Unit Caps (Ergocalciferol) .Marland Kitchen.. 1 By Mouth Weekly 4)  Prilosec Otc 20 Mg Tbec (Omeprazole Magnesium) .Marland Kitchen.. 1 Tab Once Daily As Needed 5)  Amiodarone Hcl 200 Mg Tabs (Amiodarone Hcl) .Marland Kitchen.. 1 Tab Two Times A Day 6)  Lopressor 50 Mg Tabs (Metoprolol Tartrate) .Marland Kitchen.. 1 Tab Two Times A Day 7)  Pradaxa 150 Mg Caps (Dabigatran Etexilate Mesylate) .Marland Kitchen.. 1 Cap Two Times A Day  Allergies: No Known Drug Allergies  Past History:  Past Medical History: Last updated: 03/23/2010 Anemia-NOS GERD Hyperlipidemia Hypothyroidism Osteoarthritis hypoglycemia vitamin D deficiency  Past Surgical History: Last updated: 08/29/2010 Cataract extraction per Dr. Burgess Estelle Hip Arthroplasty-Total..2003  Family History: Last updated: 08/29/2010  Negative for atrial fibrillation.  She does have an   extensive history of stroke in her family.   Social History: Last updated: 08/29/2010  The patient lives at home alone by herself.  She has a   10 pack-year history of smoking but quit a long time ago.  She drinks  alcohol on very rare occasions approximately less than 1 time per month  Review of Systems       negative other  than history of present illness  Vital Signs:  Patient profile:   77 year old female Height:      67 inches Weight:      218 pounds BMI:     34.27 Pulse rate:   52 / minute Pulse rhythm:   regular  Vitals Entered By: Jacquelin Hawking, CMA (August 30, 2010 3:51 PM)  Physical Exam  General:  obese.  no acute distress [place]les:  normocephalic and atraumatic Eyes:  PERRLA/EOM intact; conjunctiva and lids normal. Neck:  Neck supple, no JVD. No masses, thyromegaly or abnormal cervical nodes. Chest Wall:  no deformities or breast masses noted Lungs:  Clear bilaterally to auscultation and percussion. Heart:  PMI nondisplaced, regular rhythm, slow rate, no murmur, no bruits Msk:  Back normal, normal gait. Muscle strength and tone normal. Pulses:  pulses normal in all 4 extremities Extremities:  No clubbing or cyanosis. Neurologic:  Alert and oriented x 3. Skin:  Intact without lesions or rashes. Psych:  Normal affect.   Impression & Recommendations:  Problem # 1:  ATRIAL FIBRILLATION (ICD-427.31) Assessment Improved She has converted to sinus rhythm and now sinus bradycardia. I suspect she is having symptomatic bradycardia particular exercise. We will decrease her Lopressor to 25 mg twice a day and decrease her amiodarone to 200 mg a day. I'll see her back in about 4 weeks. No change in anticoagulation. Her updated medication list for this problem includes:    Aspirin 81 Mg Tbec (Aspirin) .Marland Kitchen... Take one tablet  by mouth daily    Amiodarone Hcl 200 Mg Tabs (Amiodarone hcl) .Marland Kitchen... 1 tablet daily    Metoprolol Tartrate 25 Mg Tabs (Metoprolol tartrate) .Marland Kitchen... Take one tablet by mouth twice a day  Orders: EKG w/ Interpretation (93000)  Patient Instructions: 1)  Your physician recommends that you schedule a follow-up appointment in: 4-6 weeks with Dr. Daleen Squibb 2)  Your physician has recommended you make the following change in your medication:  Prescriptions: METOPROLOL TARTRATE 25 MG TABS  (METOPROLOL TARTRATE) Take one tablet by mouth twice a day  #30 x 6   Entered by:   Lisabeth Devoid RN   Authorized by:   Gaylord Shih, MD, [facility]   Signed by:   Lisabeth Devoid RN on 08/30/2010   Method used:   [facility]        [facility] [phone]* (retail)       [address]       [place], [place]  [address]       Ph: [phone] or [phone]       Fax: [phone]   RxID:   [id number]

## 2010-11-29 NOTE — Progress Notes (Signed)
Summary: Message for MD  Phone Note Call from Patient Call back at Home Phone [phone]   Caller: Patient Summary of Call: Dr. Scotty Court asked me to call today.  Please let him know that I am at home. Initial call taken by: Trixie Dredge,  May 16, 2010 8:44 AM  Follow-up for Phone Call        ok dr Alfonzo Feller called her.  Follow-up by: Pura Spice, RN,  May 16, 2010 9:08 AM

## 2010-12-03 ENCOUNTER — Encounter: Payer: Self-pay | Admitting: Family Medicine

## 2010-12-03 ENCOUNTER — Ambulatory Visit (INDEPENDENT_AMBULATORY_CARE_PROVIDER_SITE_OTHER): Payer: Medicare Other | Admitting: Family Medicine

## 2010-12-03 VITALS — BP 140/90 | HR 60 | Temp 97.5°F

## 2010-12-03 DIAGNOSIS — J40 Bronchitis, not specified as acute or chronic: Secondary | ICD-10-CM

## 2010-12-03 MED ORDER — HYDROCODONE-HOMATROPINE 5-1.5 MG/5ML PO SYRP: 5.0000 mL | ORAL_SOLUTION | ORAL | Status: AC | PRN

## 2010-12-03 MED ORDER — AMOXICILLIN-POT CLAVULANATE 875-125 MG PO TABS: 1.0000 | ORAL_TABLET | Freq: Two times a day (BID) | ORAL | Status: AC

## 2010-12-03 NOTE — Progress Notes (Signed)
  Subjective:    Patient ID: Gina Reilly, female    DOB: 09/19/1924, 77 y.o.   MRN: [id number]  HPI Here for one week of deep cough producing yellow sputum, sometimes tinged with pink. No chest pain or SOB. No fever. On Mucinex.    Review of Systems  Constitutional: Negative.   HENT: Negative.   Eyes: Negative.   Respiratory: Positive for cough and wheezing. Negative for apnea, choking, chest tightness, shortness of breath and stridor.   Cardiovascular: Negative.        Objective:   Physical Exam  Constitutional: She appears well-developed and well-nourished.  HENT:  [place]: Normocephalic and atraumatic.  Right Ear: External ear normal.  Left Ear: External ear normal.  Nose: Nose normal.  Mouth/Throat: Oropharynx is clear and moist. No oropharyngeal exudate.  Eyes: Conjunctivae and EOM are normal. Right eye exhibits no discharge. Left eye exhibits no discharge. No scleral icterus.  Neck: Normal range of motion. Neck supple.  Pulmonary/Chest: She is in respiratory distress. She has wheezes. She has rales. She exhibits no tenderness.  Lymphadenopathy:    She has no cervical adenopathy.          Assessment & Plan:  We will treat with Augmentin for 10 days. Drink fluids

## 2010-12-16 ENCOUNTER — Other Ambulatory Visit: Payer: Self-pay | Admitting: Family Medicine

## 2010-12-20 ENCOUNTER — Telehealth: Payer: Self-pay | Admitting: Family Medicine

## 2010-12-20 NOTE — Telephone Encounter (Signed)
Pt called and is req refill of Vitamin D 5000 mg. Pt is wondering if she could buy this otc, or does she have to get a script.  [facility]

## 2010-12-29 MED ORDER — VITAMIN D (ERGOCALCIFEROL) 1.25 MG (50000 UNIT) PO CAPS: 1.0000 [IU] | ORAL_CAPSULE | ORAL | Status: DC

## 2011-01-09 LAB — DIFFERENTIAL
Basophils Absolute: 0 K/uL (ref 0.0–0.1)
Basophils Relative: 0 % (ref 0–1)
Eosinophils Absolute: 0.1 10*3/uL (ref 0.0–0.7)
Eosinophils Relative: 1 % (ref 0–5)
Lymphocytes Relative: 29 % (ref 12–46)
Lymphs Abs: 2.2 10*3/uL (ref 0.7–4.0)
Monocytes Absolute: 0.8 K/uL (ref 0.1–1.0)
Monocytes Relative: 10 % (ref 3–12)
Neutro Abs: 4.7 K/uL (ref 1.7–7.7)
Neutrophils Relative %: 60 % (ref 43–77)

## 2011-01-09 LAB — BASIC METABOLIC PANEL WITH GFR
CO2: 21 meq/L (ref 19–32)
Glucose, Bld: 112 mg/dL — ABNORMAL HIGH (ref 70–99)
Potassium: 4.2 meq/L (ref 3.5–5.1)
Sodium: 138 meq/L (ref 135–145)

## 2011-01-09 LAB — BASIC METABOLIC PANEL
BUN: 22 mg/dL (ref 6–23)
Calcium: 9.1 mg/dL (ref 8.4–10.5)
Chloride: 108 mEq/L (ref 96–112)
Creatinine, Ser: 1.04 mg/dL (ref 0.4–1.2)
GFR calc Af Amer: >60 mL/min (ref >60)
GFR calc non Af Amer: 50 mL/min — ABNORMAL LOW (ref >60)

## 2011-01-09 LAB — CBC
HCT: 43.6 % (ref 36.0–46.0)
Hemoglobin: 12.7 g/dL (ref 12.0–15.0)
Hemoglobin: 14.6 g/dL (ref 12.0–15.0)
MCH: 31.4 pg (ref 26.0–34.0)
MCHC: 33.2 g/dL (ref 30.0–36.0)
MCHC: 33.5 g/dL (ref 30.0–36.0)
MCV: 93.8 fL (ref 78.0–100.0)
Platelets: 173 10*3/uL (ref 150–400)
RBC: 4.06 MIL/uL (ref 3.87–5.11)
RBC: 4.65 MIL/uL (ref 3.87–5.11)
RDW: 13 % (ref 11.5–15.5)
WBC: 7.8 10*3/uL (ref 4.0–10.5)

## 2011-01-09 LAB — PROTIME-INR
INR: 0.98 (ref 0.00–1.49)
Prothrombin Time: 13.2 seconds (ref 11.6–15.2)
Prothrombin Time: 16.6 seconds — ABNORMAL HIGH (ref 11.6–15.2)

## 2011-01-09 LAB — CK TOTAL AND CKMB (NOT AT ARMC)
CK, MB: 1.7 ng/mL (ref 0.3–4.0)
CK, MB: 1.7 ng/mL (ref 0.3–4.0)
Relative Index: INVALID (ref 0.0–2.5)
Relative Index: INVALID (ref 0.0–2.5)
Total CK: 42 U/L (ref 7–177)
Total CK: 49 U/L (ref 7–177)

## 2011-01-09 LAB — HEPARIN LEVEL (UNFRACTIONATED)
Heparin Unfractionated: 0.37 IU/mL (ref 0.30–0.70)
Heparin Unfractionated: 0.37 IU/mL (ref 0.30–0.70)
Heparin Unfractionated: 0.52 IU/mL (ref 0.30–0.70)
Heparin Unfractionated: 0.61 IU/mL (ref 0.30–0.70)
Heparin Unfractionated: 0.9 IU/mL — ABNORMAL HIGH (ref 0.30–0.70)

## 2011-01-09 LAB — TROPONIN I
Troponin I: 0.03 ng/mL (ref 0.00–0.06)
Troponin I: 0.03 ng/mL (ref 0.00–0.06)

## 2011-01-09 LAB — APTT: aPTT: 30 s (ref 24–37)

## 2011-01-09 LAB — LIPID PANEL
HDL: 54 mg/dL (ref >39)
Total CHOL/HDL Ratio: 2.7 RATIO
VLDL: 11 mg/dL (ref 0–40)

## 2011-01-09 LAB — CARDIAC PANEL(CRET KIN+CKTOT+MB+TROPI)
Troponin I: 0.02 ng/mL (ref 0.00–0.06)
Troponin I: 0.03 ng/mL (ref 0.00–0.06)

## 2011-01-09 LAB — TSH: TSH: 2.357 u[IU]/mL (ref 0.350–4.500)

## 2011-01-10 ENCOUNTER — Other Ambulatory Visit: Payer: Self-pay | Admitting: Cardiology

## 2011-01-10 ENCOUNTER — Encounter: Payer: Self-pay | Admitting: Cardiology

## 2011-01-10 ENCOUNTER — Ambulatory Visit (INDEPENDENT_AMBULATORY_CARE_PROVIDER_SITE_OTHER): Payer: Medicare Other | Admitting: Cardiology

## 2011-01-10 DIAGNOSIS — R5381 Other malaise: Secondary | ICD-10-CM | POA: Insufficient documentation

## 2011-01-10 DIAGNOSIS — R5383 Other fatigue: Secondary | ICD-10-CM

## 2011-01-10 DIAGNOSIS — I4891 Unspecified atrial fibrillation: Secondary | ICD-10-CM

## 2011-01-10 DIAGNOSIS — Z79899 Other long term (current) drug therapy: Secondary | ICD-10-CM

## 2011-01-11 ENCOUNTER — Inpatient Hospital Stay (HOSPITAL_COMMUNITY): Admission: RE | Admit: 2011-01-11 | Payer: Medicare Other | Source: Ambulatory Visit

## 2011-01-11 ENCOUNTER — Ambulatory Visit (HOSPITAL_COMMUNITY)
Admission: RE | Admit: 2011-01-11 | Discharge: 2011-01-11 | Disposition: A | Discharge status: Home / Self Care | Payer: Medicare Other | Source: Ambulatory Visit | Attending: Gynecology | Admitting: Gynecology

## 2011-01-11 ENCOUNTER — Ambulatory Visit: Payer: Medicare Other | Attending: Gynecology | Admitting: Gynecology

## 2011-01-11 ENCOUNTER — Other Ambulatory Visit: Payer: Self-pay | Admitting: Gynecology

## 2011-01-11 DIAGNOSIS — N83209 Unspecified ovarian cyst, unspecified side: Secondary | ICD-10-CM | POA: Insufficient documentation

## 2011-01-11 DIAGNOSIS — Z113 Encounter for screening for infections with a predominantly sexual mode of transmission: Secondary | ICD-10-CM | POA: Insufficient documentation

## 2011-01-11 DIAGNOSIS — N83201 Unspecified ovarian cyst, right side: Secondary | ICD-10-CM

## 2011-01-11 DIAGNOSIS — Z9079 Acquired absence of other genital organ(s): Secondary | ICD-10-CM | POA: Insufficient documentation

## 2011-01-11 DIAGNOSIS — Z780 Asymptomatic menopausal state: Secondary | ICD-10-CM | POA: Insufficient documentation

## 2011-01-11 DIAGNOSIS — Z01419 Encounter for gynecological examination (general) (routine) without abnormal findings: Secondary | ICD-10-CM | POA: Insufficient documentation

## 2011-01-11 LAB — BASIC METABOLIC PANEL
BUN: 23 mg/dL (ref 6–23)
GFR: 54.12 mL/min — ABNORMAL LOW (ref >60.00)
Potassium: 4.9 mEq/L (ref 3.5–5.1)

## 2011-01-11 LAB — HEPATIC FUNCTION PANEL
AST: 23 U/L (ref 0–37)
Total Bilirubin: 0.6 mg/dL (ref 0.3–1.2)

## 2011-01-11 LAB — TSH: TSH: 0.88 u[IU]/mL (ref 0.35–5.50)

## 2011-01-11 LAB — T4, FREE: Free T4: 1.32 ng/dL (ref 0.60–1.60)

## 2011-01-13 LAB — CA 125: CA 125: 5.2 U/mL (ref 0.0–30.2)

## 2011-01-14 ENCOUNTER — Other Ambulatory Visit (HOSPITAL_COMMUNITY): Payer: Medicare Other

## 2011-01-15 NOTE — Assessment & Plan Note (Signed)
Summary: f58m/dfg   Visit Type:  follow-up 3 mo Primary Provider:  Judithann Sheen MD  CC:  sob while walking and pt has no other complaints today.Marland Kitchen  History of Present Illness: Gina Reilly comes in with CC of extreme fatique. Her legs feel like lead and she becomes very tired with walking. No SOB or CP. No sxs of claudication or nueropathy.  EKG shows marked SB.  Current Medications (verified): 1)  Multivitamins  Tabs (Multiple Vitamin) .... Once Daily 2)  Vitamin D 21308 Unit Caps (Ergocalciferol) .Marland Kitchen.. 1 By Mouth Weekly 3)  Amiodarone Hcl 200 Mg Tabs (Amiodarone Hcl) .Marland Kitchen.. 1 Tablet Daily 4)  Metoprolol Tartrate 25 Mg Tabs (Metoprolol Tartrate) .... Take One-Half of A  Tablet By Mouth Every Am 5)  Pradaxa 150 Mg Caps (Dabigatran Etexilate Mesylate) .Marland Kitchen.. 1 Cap Two Times A Day  Allergies (verified): No Known Drug Allergies  Past History:  Past Medical History: Last updated: 03/23/2010 Anemia-NOS GERD Hyperlipidemia Hypothyroidism Osteoarthritis hypoglycemia vitamin D deficiency  Past Surgical History: Last updated: 08/29/2010 Cataract extraction per Dr. Burgess Estelle Hip Arthroplasty-Total..2003  Family History: Last updated: 08/29/2010  Negative for atrial fibrillation.  She does have an   extensive history of stroke in her family.   Social History: Last updated: 08/29/2010  The patient lives at home alone by herself.  She has a   10 pack-year history of smoking but quit a long time ago.  She drinks  alcohol on very rare occasions approximately less than 1 time per month  Review of Systems       negative other than HPI  Vital Signs:  Patient profile:   77 year old female Height:      67 inches Weight:      223 pounds BMI:     35.05 Pulse rate:   48 / minute Resp:     18 per minute BP sitting:   200 / 100  (left arm) Cuff size:   large  Vitals Entered By: Celestia Khat, CMA (January 10, 2011 3:51 PM)  Physical Exam  General:  obese.   [place]:  normocephalic  and atraumatic Eyes:  PERRLA/EOM intact; conjunctiva and lids normal. Neck:  Neck supple, no JVD. No masses, thyromegaly or abnormal cervical nodes. Chest [place]i Blyden:  no deformities or breast masses noted Lungs:  Clear bilaterally to auscultation and percussion. Heart:  slow RRR, No murmur or bruits. Msk:  Back normal, normal gait. Muscle strength and tone normal. Pulses:  pulses normal in all 4 extremities Extremities:  No clubbing or cyanosis. Neurologic:  Alert and oriented x 3. Skin:  Intact without lesions or rashes. Psych:  Normal affect.   Impression & Recommendations:  Problem # 1:  ATRIAL FIBRILLATION (ICD-427.31)  Her updated medication list for this problem includes:    Amiodarone Hcl 200 Mg Tabs (Amiodarone hcl) .Marland Kitchen... 1 tablet daily    Metoprolol Tartrate 25 Mg Tabs (Metoprolol tartrate) .Marland Kitchen... Take one-half of a  tablet by mouth every am as needed if you feel your heart is out of rhythm  Orders: EKG w/ Interpretation (93000)  Problem # 2:  BRADYCARDIA (ICD-427.89) Assessment: Unchanged Stop metoprolol. Her updated medication list for this problem includes:    Amiodarone Hcl 200 Mg Tabs (Amiodarone hcl) .Marland Kitchen... 1 tablet daily    Metoprolol Tartrate 25 Mg Tabs (Metoprolol tartrate) .Marland Kitchen... Take one-half of a  tablet by mouth every am as needed if you feel your heart is out of rhythm  Problem # 3:  FATIGUE (ICD-780.79) Assessment: New I will stop her low dose metoprolol and make as needed. She will call and let us know how she is doing.  Problem # 4:  LONG-TERM (CURRENT) USE OF OTHER MEDICATIONS (ICD-V58.69) Assessment: Unchanged  Orders: TLB-BMP (Basic Metabolic Panel-BMET) (80048-METABOL) TLB-Hepatic/Liver Function Pnl (80076-HEPATIC) TLB-TSH (Thyroid Stimulating Hormone) (84443-TSH) TLB-T4 (Thyrox), Free [phone])  Problem # 5:  GERD (ICD-530.81) Assessment: Unchanged  Orders: EKG w/ Interpretation (93000)  Patient Instructions: 1)  Your physician recommends  that you schedule a follow-up appointment in: 6 months with Dr. Daleen Squibb 2)  Your physician has recommended you make the following change in your medication: Take metoprolol only if you feel your heart is out of rhythm 3)  Your physician recommends that you have  lab work today:

## 2011-01-15 NOTE — Consult Note (Signed)
NAME:  Gina Reilly, Gina Reilly                  ACCOUNT NO.:  [id number]  MEDICAL RECORD NO.:  [id number]           PATIENT TYPE:  O  LOCATION:  U                            FACILITY[facility]  PHYSICIAN:  De Blanch, M.D.DATE OF BIRTH:  12-28-1923  DATE OF CONSULTATION:01/11/2011 DATE OF DISCHARGE:                                CONSULTATION   CHIEF COMPLAINT:  Ovarian cyst.  INTERVAL HISTORY:  Since her last visit with me in July 2011, the patient has done well from a gynecologic point of view.  She has a large ovarian cyst which appeared benign on initial imaging with a normal CA- 125.  Over the subsequent 6 months, she has not had any pelvic symptoms. She denies any pressure, pain or any other GI or GU symptoms.  The patient's most notable medical problem is that she has in the interval developed atrial fibrillation.  This seems to be under good control at the present time with new medications listed below.  HISTORY OF PRESENT ILLNESS:  The patient initially presented with a relatively asymptomatic 14 x 11 x 10 cm pelvic cyst.  The CA-125 was normal and imaging of the cyst suggests that this is benign.  Because the patient was asymptomatic, we elected to follow her and a repeat ultrasound today shows no significant change in the cyst.  PAST GYNECOLOGIC HISTORY:  Fourteen pregnancies, nine living children. The patient had a hysterectomy in 1972.  PAST MEDICAL HISTORY:  Atrial fibrillation.  PAST SURGICAL HISTORY:  Hysterectomy in 1972, cataract surgery and appendectomy.  CURRENT MEDICATIONS:  Multivitamins, aspirin, Pradaxa, metoprolol, amiodarone, vitamin D and multivitamins.  DRUG ALLERGIES:  NONE.  FAMILY HISTORY:  Grandmother with breast cancer.  SOCIAL HISTORY:  The patient is widowed.  She does not smoke.  She lives independently.  REVIEW OF SYSTEMS:  A 10-point comprehensive review of systems was negative except as noted above.  PHYSICAL EXAMINATION:  VITAL  SIGNS:  Weight 223 pounds.  Blood pressure 130/100. GENERAL:  The patient is a healthy, elderly white female in no acute distress. HEENT:  Negative. NECK:  Supple without thyromegaly.  There is no supraclavicular or inguinal adenopathy. ABDOMEN:  Soft, nontender.  No mass, organomegaly, ascites or hernias are noted.  There is a fullness in the mid lower abdomen extending about to the umbilicus. PELVIC EXAMINATION:  EGBUS and vagina are normal.  Cervix and uterus are surgically absent.  On bimanual examination there is a cystic mass filling the pelvis and extending nearly to the umbilicus.  This is asymptomatic and is not painful on palpation.  Rectovaginal examination confirms.  There is no nodularity.  IMPRESSION AND PLAN:  Benign ovarian cyst.  This is unchanged over 6 months.  I do not believe that this represents a malignancy and given its asymptomatic nature, would recommend that it be followed.  We would suggest that the patient have an ultrasound at 2-month intervals.  These can be arranged through Dr. Charmian Muff office.  If there is any significant change or any concerns we would be happy to see the patient back again.     Reuel Boom  Clarke-Pearson, M.D.     DC/MEDQ  D:  01/11/2011  T:  01/11/2011  Job:  [id number]  cc:   Ellin Saba., MD [address] [place] [address]  Telford Nab, R.N. [address] [place], [place] [address]  Electronically Signed by De Blanch M.D. on 01/15/2011 06:41:52 AM

## 2011-01-28 ENCOUNTER — Other Ambulatory Visit: Payer: Self-pay | Admitting: Internal Medicine

## 2011-02-13 ENCOUNTER — Other Ambulatory Visit: Payer: Self-pay | Admitting: Internal Medicine

## 2011-02-13 NOTE — Telephone Encounter (Signed)
Received call from patient that her pharmacy has not yet received refill request. Not sure why this is so. Reviewed chart, pt is taking Pradaxa 150mg  po bid without any symptoms of bleeding. Called [facility] to fill prescription for #60 with 5 refills. Pt expressed gratitude. I apologized for the delay.

## 2011-03-15 NOTE — Op Note (Signed)
TNAME:  Gina Reilly, [name]                           ACCOUNT NO.:  [id number]   MEDICAL RECORD NO.:  [id number]                   PATIENT TYPE:  INP   LOCATION:  X006                                 FACILITY:  [facility]   PHYSICIAN:  Almedia Balls. Ranell Patrick, M.D.              DATE OF BIRTH:  [birth year]   DATE OF PROCEDURE:  06/17/2002  DATE OF DISCHARGE:                                 OPERATIVE REPORT   PREOPERATIVE DIAGNOSIS:  Left hip osteoarthritis.   POSTOPERATIVE DIAGNOSIS:  Left hip osteoarthritis.   PROCEDURE PERFORMED:  Left total hip arthroplasty using Osteonics Ion  system.   SURGEON:  Almedia Balls. Ranell Patrick, M.D.   FIRST ASSISTANT:  Georges Lynch. Darrelyn Hillock, M.D.   ANESTHESIA:  Spinal plus MAC.   ESTIMATED BLOOD LOSS:  400 cc.   URINE OUTPUT:  350 cc.   CRYSTALLOID REPLACEMENT:  3100 cc.   INSTRUMENT COUNT:  Correct.   COMPLICATIONS:  None.   DRAINS:  One drain was placed.   ANTIBIOTICS:  Perioperative antibiotics were given.   INDICATIONS FOR PROCEDURE:  The patient is a 77 year old otherwise healthy  female who presents for worsening left sided hip pain. She has had  radiographs demonstrating progressive severe osteoarthritis. The patient is  now unable to walk comfortably more than a block without a cane and is  unable to sleep comfortably at night due to her hip discomfort. After  discussing thoroughly all options for management with this patient including  trying nonoperative treatment for six months. The patient now desires total  hip replacement. Informed consent was obtained.   DESCRIPTION OF PROCEDURE:  After an adequate level of anesthesia was  achieved, the patient was placed in the right lateral decubitus position  with the lip hip up. All neurovascular structures were padded appropriately  after a sterile prep and drape of the left hip and leg. A longitudinal skin  incision was created centered over the posterior aspect of the greater  trochanter of the left hip and  the posterior Kocher-Langenbeck approach was  used. Dissection was carried sharply down through the subcutaneous tissues  using Bovie electrocautery. Hemostasis was achieved with Bovie. The tensor  fascia lata was identified and incised in line with the femur and blunt  dissection was used to extend the muscular portion of the dissection. This  exposed the sciatic nerve which was identified and protected. The gluteus  maximus tendon was then divided. Next, the gluteus minimis tendon was  retracted and the short external rotators were taken off the posterior  aspect of the femur exposing the patient's posterior hip capsule which was  excised in its entirety. Once the hip capsule was excised, the large rim  osteophyte was removed from the acetabulum allowing the hip to be  dislocated. Once it was dislocated, the oscillating saw was used to make a  neck cut using a broach as a guide one finger breadth  above the lesser  trochanter. Once the neck cut was made, a cookie cutter was used to remove  bone from just adjacent to the greater trochanter. Sequential reamers and  broaches were used to broach up to a 7 Ion stem, this is manufactured by  [employer]. Once the normal preparations were performed, the bone  hook was used to remove the anterior capsule. Once complete capsulectomy was  performed, retractors were placed such that the acetabulum was completely  exposed. The osteophytes in the fovea were removed using a curette raising  the floor of the fovea. Next sequential reamers were used to ream up to a  size 51 mm which would accommodate a size 52 PSL cup. This was then selected  and then impacted in place with attention towards appropriate anteversion  and abduction. Once the cup was secured in place, a trial 10 degree lipped  liner was inserted and then the trial broach size 70 Ion stem was placed  back into the femur with a 127 degree neck angle and plus zero Witherell. The hip  was  then trialed, there was noted to be excessive tension and it was felt  that this was too much offset for this patient. Although the hip was very  stable it was somewhat difficult to reduce because of the offset, thus the  132 degree neck was selected and a zero Delsol again was trialed. The patient  was reduced and she was noted to have excellent stability and more natural  soft tissue tension, there appeared to be an appropriate amount of abduction  and offset. At this point, the trial implants were removed. Two screws were  placed into the ileum securing the cup in place followed by insertion of the  real liner, this was a 10 degree lip placed in a posterior superior  location. Once the liner was locked into place, a sponge was placed to  protect the liner. The femoral canal was then prepared using pulse  irrigation followed by an EPI soaked sponge. A distal cement restrictor was  then placed after appropriate sizing and this was a size four placed distal  to the stem and then the cement was mixed and the size 7, 132 degree neck  Ion stem was then cemented firmly in place with appropriate anteversion.  Once the cement was allowed to harden, again the hip was trialed and there  was noted to be appropriate soft tissue tensioning with the zero trial Fannin  and excellent stability. There was on bony or soft tissue impingement after  removal of all osteophytes. At this point, the real Spilman was implanted onto  the more stable stem. The hip was thoroughly irrigated and reduced and  excellent stability was noted. The drain was placed deep to fascia. The  gluteus maximus tendon was repaired with #1 Vicryl. Next the short external  rotators were reapproximated to the greater trochanter through drill holes  using #1 Ethibond suture. Finally the tensor fascia lata was repaired using interrupted figure-of-eight #1 Vicryls with care taken to not sew in the  drain. The fat was closed in layers using Vicryl  followed by subcu closure  with 2-0 and then staples for the skin. The patient had a sterile dressing  applied and was taken in stable condition to the recovery room. Notably the  patient's leg lengths were symmetric on the table after turning her supine  and she was placed in a knee immobilizer as well.  Almedia Balls. Ranell Patrick, M.D.    SRN/MEDQ  D:  06/17/2002  T:  06/18/2002  Job:  [id number]

## 2011-03-15 NOTE — H&P (Signed)
   NAME:  Gina Reilly, Gina Reilly                            ACCOUNT NO.:  [id number]   MEDICAL RECORD NO.:  [id number]                   PATIENT TYPE:  INP   LOCATION:  NA                                   FACILITY:  [facility]   PHYSICIAN:  Raynelle Fanning B. Payton Emerald, P.A.                 DATE OF BIRTH:  1924/09/02   DATE OF ADMISSION:  06/17/2002  DATE OF DISCHARGE:                                HISTORY & PHYSICAL   INCOMPLETE:   CHIEF COMPLAINT:  Left hip pain.   HISTORY OF PRESENT ILLNESS:  The patient is a pleasant 77 year old female  with a history of left hip pain that has gotten productively worse in the  past year.  She has tried numerous treatment measures including activity  modification, walking with a cane, nonsteroidals, all of which has helped  only minimally.  She had radiographs in the office on May 13, 2002 which  revealed severe osteoarthritis with cystic formation in her acetabulum as  well as a large inferior osteophyte off of her acetabulum.  It was felt she  would benefit from undergoing a left total hip arthroplasty.  The risks and  benefits as well as the procedure were discussed with the patient and she  elected to proceed.   ALLERGIES:  No known drug allergies.   MEDICATIONS:  1. Vioxx 25 mg one p.o. q.d.  2. Avapro 300 mg one p.o. q.d.   PAST MEDICAL HISTORY:  Significant for irritable bowel syndrome.  She  reports that when she does have a flare up of this she starts on Metamucil  which does regulate her better.  Osteoarthritis, postmenopausal.   PAST SURGICAL HISTORY:  Partial hysterectomy 1972, cholecystectomy 1979.   SOCIAL HISTORY:  The patient is married.  Social drinker only.  No tobacco  use.  She lives in a one-level home.  Plans for home health PT.   FAMILY HISTORY:  Mother deceased age 10 CVA.  Father deceased age 23  prostate cancer.   REVIEW OF SYMPTOMS:  GENERAL:  No fevers, chills, night sweats, or bleeding  tendencies.  PULMONARY:  No shortness of breath,  productive cough, or  hemoptysis.  CARDIOVASCULAR:  No chest pain, angina, orthopnea.  ENDOCRINE:  No history of hypothyroidism or hyperthyroidism.  GASTROINTESTINAL:  Significant for alternating diarrhea and constipation associated with her  irritable bowel syndrome.  No melena, nausea, or vomiting.  GENITOURINARY:                                               Bradd Canary. Clabe Seal.    JBB/MEDQ  D:  06/08/2002  T:  06/11/2002  Job:  [id number]

## 2011-03-15 NOTE — H&P (Signed)
NAME:  Gina Reilly, Gina Reilly                            ACCOUNT NO.:  [id number]   MEDICAL RECORD NO.:  [id number]                   PATIENT TYPE:  INP   LOCATION:  NA                                   FACILITY:  [facility]   PHYSICIAN:  Almedia Balls. Ranell Patrick, M.D.              DATE OF BIRTH:  1924-04-12   DATE OF ADMISSION:  06/17/2002  DATE OF DISCHARGE:                                HISTORY & PHYSICAL   CHIEF COMPLAINT:  Left hip pain.   HISTORY OF PRESENT ILLNESS:  This is a pleasant 77 year old female who has  had a long history of left hip pain despite conservative treatment.  She had  radiographs in the office which revealed severe osteoarthritis to her left  hip.  She tried activity modifications, walking with a cane, and non-  steroidals, all of which were refractory.  It was felt she would benefit  from undergoing a left total hip arthroplasty.  The risks and benefits as  well as the procedure were discussed with the patient, and she elected to  proceed.   ALLERGIES:  No known drug allergies.   MEDICATIONS:  1. Vioxx 25 mg one p.o. q.d.  2. Avapro 300 mg one p.o. q.d.   PAST MEDICAL HISTORY:  1. Irritable bowel syndrome.  When she has a flare-up she uses Atacand or     Metamucil which she tolerates well.  2. She is menopausal.  3. History of osteoarthritis.   PAST SURGICAL HISTORY:  1. Partial hysterectomy in 1972.  2. Cholecystectomy in 1979.   SOCIAL HISTORY:  Is married, social drinker, no tobacco, one level home.  Plans for home health physical therapy.   FAMILY HISTORY:  Mother deceased at age 62, history of cerebrovascular  accident.  Father deceased at age 23, prostate cancer.   REVIEW OF SYMPTOMS:  GENERAL:  No fevers, chills, night sweats, or bleeding  tendencies.  PULMONARY:  No shortness of breath, productive cough, or  hemoptysis.  CARDIOVASCULAR:  No chest pain, angina, or orthopnea.  ENDOCRINE:  No history of hypo or hyperthyroidism.  GASTROINTESTINAL:  Irritable bowel, diarrhea and constipation.  No melena.  GENITOURINARY:  Positive for stress incontinence.  She does have to wear a sanitary napkin  all the time.  PSYCHIATRIC:  No history of anxiety or depression.   PHYSICAL EXAMINATION:  GENERAL:  Alert and oriented x3, well-developed, well-  nourished, 77 year old female.  VITAL SIGNS:  Pulse 60, respiratory rate 16, blood pressure 150/80.  HEENT:  Normocephalic, atraumatic.  Oropharynx is clear.  NECK:  Supple, negative for cervical lymphadenopathy.  CHEST:  Clear to auscultation bilaterally.  No wheezes, rhonchi, or rales.  BREASTS:  Not pertinent to present illness.  HEART:  S1 and S2, negative for murmurs, rubs, or gallops.  Heart is regular  in rate and rhythm.  ABDOMEN:  Soft, nontender, positive bowel sounds.  GENITOURINARY:  Not pertinent  to present illness.  EXTREMITIES:  She walks with a cane with an antalgic gait.  She has limited  internal rotation.  She has pain with axial loading of her hip.  SKIN:  Intact, no rashes or lesions appreciated on examination.   LABORATORY DATA:  Preoperative labs and x-rays are pending.   IMPRESSION:  1. Osteoarthritis, left hip.  2. Urinary stress incontinence.  3. Irritable bowel syndrome.  4. Menopausal.   PLAN:  The patient is scheduled for a left total hip arthroplasty with Dr.  Malon Kindle.      Bradd Canary Clabe Seal.                       Almedia Balls. Ranell Patrick, M.D.    JBB/MEDQ  D:  06/08/2002  T:  06/11/2002  Job:  [id number]

## 2011-03-15 NOTE — Discharge Summary (Signed)
NAME:  Gina Reilly, Gina Reilly                            ACCOUNT NO.:  [id number]   MEDICAL RECORD NO.:  [id number]                   PATIENT TYPE:  INP   LOCATION:  0482                                 FACILITY[facility]   PHYSICIAN:  Almedia Balls. Ranell Patrick, M.D.              DATE OF BIRTH:  Apr 06, 1924   DATE OF ADMISSION:  06/17/2002  DATE OF DISCHARGE:  06/22/2002                                 DISCHARGE SUMMARY   ADMISSION DIAGNOSES:  1. Osteoarthritis left hip.  2. Urinary stress incontinence.  3. Irritable bowel syndrome.  4. Menopausal.  5. Hypertension.   DISCHARGE DIAGNOSES:  1. Osteoarthritis left hip.  2. Urinary stress incontinence.  3. Irritable bowel syndrome.  4. Menopausal.  5. Hypertension.  6. Post hemorrhagic anemia.  7. Moderate left hip wound drainage, probable secondary to fat necrosis.   PROCEDURE:  On June 17, 2002 patient underwent left total hip arthroplasty  utilizing Osteonic system performed by Dr. Ranell Patrick, assisted by Dr. Darrelyn Hillock  under spinal anesthesia.   CONSULTATIONS:  Dr. Thomasena Edis of physical medicine and rehabilitation.   BRIEF HISTORY:  The patient is a very pleasant 77 year old white female with  radiographically proven progressive severe osteoarthritis of the left hip.  Currently, she is unable to ambulate without the assistance of a cane and is  having difficulty with activities of daily living.  She has failed  nonoperative treatment over a six month period.  It is now felt she will  require definitive treatment involving left total hip arthroplasty.  She was  admitted to undergo this procedure.   HOSPITAL COURSE:  The patient tolerated the procedure under spinal  anesthesia without difficulties.  Postoperatively the patient was placed on  Coumadin protocol for DVT and PE prophylaxis.  Adjustments in Coumadin dose  were made according to daily pro times by the pharmacist at [facility].  Postoperatively neurovascular motor function of  the lower  extremities was noted to be intact.  The patient was started on physical  therapy program for ambulation and gait training as well as range of motion  and strengthening exercise.  She was allowed weightbearing as tolerated on  the left lower extremity.  She advanced well with physical therapy and prior  to discharge was ambulating 200 feet utilizing a standard walker.  The  patient's Hemovac drain was discontinued on the first postoperative day.  Following that, daily dressing changes were done.  It was noted that patient  had a moderate amount of serous drainage from the wound.  As this progressed  she was noted to have some mild erythema surrounding the wound.  Edema was  improving each day.  On the fourth postoperative day she was placed on  empiric Keflex for early cellulitic changes as well as coverage for drainage  from the wound.  CBC was drawn at that time and her white blood count was  noted to be 9.0.  She was not felt to have an active infection.  Initially  postoperatively she did have some difficulty with hypertension.  The patient  had fluid volume overload and was diuresed.  Following this she had  normalization of her blood pressure.  Prior to discharge she did have some  incidents of elevated blood pressure readings.  She does have hypertension  and states that she usually runs somewhat high.  She is on Avapro.  She is  monitored by Dr. Quintella Reichert on an outpatient basis.  The patient received a  rehabilitation consult, however, was not felt to be a candidate secondary to  her high level of activity during the hospital stay.  Therefore, on June 22, 2002 arrangements were made for her to be discharged to her home.  Prior  to discharge she did receive an occupational therapy consult who instructed  the patient and family in ADLs.  At the time of discharge patient was  afebrile.  Blood pressure was 170/100.  The patient was comfortable on oral  analgesics.  She had  initially used PCA analgesics and tolerated the weaning  process to oral analgesics without difficulty.  Pertinent laboratory values  during the hospital stay include urinalysis on admission which was negative  for urinary tract infection.  Admission chemistry studies were within normal  limits with the exception of glucose 127.  Repeat BMET on August 22 and  August 23 were normal as well.  She did have mildly elevated glucose on  those values as well being 128 and 116, respectively.  CBC on admission with  hemoglobin 13.6, hematocrit 39.5.  Hemoglobin dropped to the lowest value of  10.3 and was felt to be stable.  Prior to discharge her CBC indicated a WBC  which was normal.  Coagulation studies on admission were normal and at  discharge her INR was noted to be 1.8.  Chest x-ray on admission showed no  evidence of acute disease.  Probable calcified granuloma in the superior  segment of the right lower lobe.  It was felt that follow-up chest x-ray in  four to six months would be necessary.  EKG on admission showed normal sinus  rhythm, inferior infarct age undetermined with no old tracings for  comparison.   PLAN:  The patient is being discharged to her home.  There she will have  assistance by her family.  Arrangements have been made for [facility] to provide home health physical therapy as well as Coumadin  management.  The patient will continue to be weightbearing as tolerated and  will undergo range of motion and strengthening exercises as well as  ambulation and gait training.  She has been taught total hip replacement  precautions and will adhere to these during her time at home.  These will  also be monitored by the physical therapy at her home.  The patient and  family will change the dressing daily.  They have been advised to avoid  showering until all drainage has discontinued.  The patient will follow up with Dr. Ranell Patrick in approximately one week and has been  advised to call to  arrange the appointment.  She has also been advised to follow up with Dr.  Quintella Reichert within the next week for recheck of her blood pressure and possible  further treatment plan for her elevated blood pressures.  A copy of her  graphic vital signs will be sent with her to this office visit.  She should  also follow up with him regarding her abnormal chest x-ray on admission.  A  repeat chest x-ray has been suggested in four to six months and can be done  under Dr. Jearl Klinefelter orders.  The patient will resume a regular diet at home.  She will use over-the-counter stool softeners or laxatives as needed.   DISCHARGE MEDICATIONS:  1. Percocet 5/325 number 40 one to two q.4-6h. p.r.n. for pain.  2. Robaxin 500 mg number 30 one q.8h. as needed for spasm.  3. Keflex 500 mg one p.o. q.i.d. number 30.  4. Coumadin.  5. She will discontinue her Vioxx, however, will continue on all of her     other home medications as taken prior to admission.    DISCHARGE INSTRUCTIONS:  The patient has been advised to call the office if  she has questions or concerns prior to her return office visit.   CONDITION ON DISCHARGE:  Stable.     Wende Neighbors, P.A.                    Almedia Balls. Ranell Patrick, M.D.    SMV/MEDQ  D:  06/22/2002  T:  06/22/2002  Job:  [id number]   cc:   Lacretia Leigh. Quintella Reichert, M.D.

## 2011-07-18 ENCOUNTER — Encounter: Payer: Self-pay | Admitting: Cardiology

## 2011-07-18 ENCOUNTER — Ambulatory Visit (INDEPENDENT_AMBULATORY_CARE_PROVIDER_SITE_OTHER): Payer: Medicare Other | Admitting: Cardiology

## 2011-07-18 VITALS — BP 150/100 | HR 43 | Ht 68.0 in | Wt 228.0 lb

## 2011-07-18 DIAGNOSIS — I498 Other specified cardiac arrhythmias: Secondary | ICD-10-CM

## 2011-07-18 DIAGNOSIS — I4891 Unspecified atrial fibrillation: Secondary | ICD-10-CM

## 2011-07-18 NOTE — Assessment & Plan Note (Signed)
She is having recurrent symptoms of A. Fib with exertion. It is very predictable. We'll obtain a 24-hour monitor to correlate. We'll also  Evaluate her heart rate particularly with her bradycardia. Blood work pending today. We'll not change any meds until monitor available.

## 2011-07-18 NOTE — Patient Instructions (Signed)
Your physician recommends that you have lab work today bmp,T4, TSH, Liver enzymes.  We will call you with your results.  Your physician has recommended that you wear a 24 hour holter monitor. Holter monitors are medical devices that record the heart's electrical activity. Doctors most often use these monitors to diagnose arrhythmias. Arrhythmias are problems with the speed or rhythm of the heartbeat. The monitor is a small, portable device. You can wear one while you do your normal daily activities. This is usually used to diagnose what is causing palpitations/syncope (passing out).  Your physician recommends that you schedule a follow-up appointment in: 6 months with Dr. Daleen Squibb

## 2011-07-18 NOTE — Progress Notes (Signed)
HPI Gina Reilly is a delightful 77 year old white female with a history of paroxysmal A. Fib. Her initial presentation was throat burning. She has been on maintenance amiodarone, very low dose Lopressor because of asymptomatic bradycardia, and anticoagulation. He has a low fall risk. He is very independent.   She complains of throat burning when she pushes herself. It did not radiate. There is no associated presyncope, syncope, or chest pain. She feels like she is going into A. Fib.  She is compliant with her medications. She will have lab data today for her amiodarone.  On her last visit, we had decreased her Lopressor to 12.5 mg a day. Because of the above symptoms, she increased it back to 25 mg. EKG shows sinus bradycardia with a heart rate of 43 beats per minute. She has a  first degree block which is stable. Past Medical History  Diagnosis Date  . Anemia   . GERD (gastroesophageal reflux disease)   . Thyroid disease     hypothroidism  . Arthritis     osteo  . Hyperlipidemia   . Vitamin D deficiency     Past Surgical History  Procedure Date  . Cataract extraction     Dr. Burgess Estelle  . Total hip arthroplasty 2003    Family History  Problem Relation Age of Onset  . Stroke      family hx    History   Social History  . Marital Status: Widowed    Spouse Name: N/A    Number of Children: N/A  . Years of Education: N/A   Occupational History  . Not on file.   Social History Main Topics  . Smoking status: Former Games developer  . Smokeless tobacco: Never Used  . Alcohol Use: Yes  . Drug Use: No  . Sexually Active: Not on file   Other Topics Concern  . Not on file   Social History Narrative  . No narrative on file    No Known Allergies  Current Outpatient Prescriptions  Medication Sig Dispense Refill  . amiodarone (PACERONE) 200 MG tablet        . metoprolol (LOPRESSOR) 25 MG tablet Take 25 mg by mouth every morning.       . Multiple Vitamin (MULTIVITAMIN) tablet Take 1  tablet by mouth daily.        Marland Kitchen PRADAXA 150 MG CAPS TAKE 1 CAPSULE TWICE DAILY, CALL MD WITH UNUSUSAL BLEEDING  60 capsule  5  . Vitamin D, Ergocalciferol, (DRISDOL) 50000 UNITS CAPS Take 1 capsule (50,000 Units total) by mouth once a week.  12 capsule  3    ROS Negative other than HPI.   PE General Appearance: well developed, well nourished in no acute distress, obese HEENT: symmetrical face, PERRLA, good dentition  Neck: no JVD, thyromegaly, or adenopathy, trachea midline Chest: symmetric without deformity Cardiac: PMI non-displaced, slow rate and regular rhythm, normal S1, S2, no gallop or murmur Lung: clear to ausculation and percussion Vascular: all pulses full without bruits  Abdominal: nondistended, nontender, good bowel sounds, no HSM, no bruits Extremities: no cyanosis, clubbing or edema, no sign of DVT, no varicosities  Skin: normal color, no rashes Neuro: alert and oriented x 3, non-focal Pysch: normal affect Filed Vitals:   07/18/11 1439  BP: 150/100  Pulse: 43  Height: 5\' 8"  (1.727 m)  Weight: 228 lb (103.42 kg)    EKG  Labs and Studies Reviewed.   Lab Results  Component Value Date   WBC 8.0 08/17/2010  HGB 12.7 08/17/2010   HCT 38.3 08/17/2010   MCV 94.3 08/17/2010   PLT 160 08/17/2010      Chemistry      Component Value Date/Time   NA 137 01/10/2011 1626   K 4.9 01/10/2011 1626   CL 103 01/10/2011 1626   CO2 27 01/10/2011 1626   BUN 23 01/10/2011 1626   CREATININE 1.0 01/10/2011 1626      Component Value Date/Time   CALCIUM 8.9 01/10/2011 1626   ALKPHOS 53 01/10/2011 1626   AST 23 01/10/2011 1626   ALT 16 01/10/2011 1626   BILITOT 0.6 01/10/2011 1626       Lab Results  Component Value Date   CHOL  Value: 146        ATP III CLASSIFICATION:  <200     mg/dL   Desirable  161-096  mg/dL   Borderline High  >=045    mg/dL   High        40/98/1191   CHOL 208* 02/09/2009   Lab Results  Component Value Date   HDL 54 08/15/2010   HDL 54.20 02/09/2009    Lab Results  Component Value Date   LDLCALC  Value: 81        Total Cholesterol/HDL:CHD Risk Coronary Heart Disease Risk Table                     Men   Women  1/2 Average Risk   3.4   3.3  Average Risk       5.0   4.4  2 X Average Risk   9.6   7.1  3 X Average Risk  23.4   11.0        Use the calculated Patient Ratio above and the CHD Risk Table to determine the patient's CHD Risk.        ATP III CLASSIFICATION (LDL):  <100     mg/dL   Optimal  478-295  mg/dL   Near or Above                    Optimal  130-159  mg/dL   Borderline  621-308  mg/dL   High  >657     mg/dL   Very High 84/69/6295   Lab Results  Component Value Date   TRIG 57 08/15/2010   TRIG 136.0 02/09/2009   Lab Results  Component Value Date   CHOLHDL 2.7 08/15/2010   CHOLHDL 4 02/09/2009   Lab Results  Component Value Date   HGBA1C 5.9 01/10/2010   Lab Results  Component Value Date   ALT 16 01/10/2011   AST 23 01/10/2011   ALKPHOS 53 01/10/2011   BILITOT 0.6 01/10/2011   Lab Results  Component Value Date   TSH 0.88 01/10/2011

## 2011-07-19 LAB — HEPATIC FUNCTION PANEL
Bilirubin, Direct: 0 mg/dL (ref 0.0–0.3)
Total Bilirubin: 0.4 mg/dL (ref 0.3–1.2)

## 2011-07-19 LAB — BASIC METABOLIC PANEL
GFR: 43.64 mL/min — ABNORMAL LOW (ref >60.00)
Glucose, Bld: 96 mg/dL (ref 70–99)
Potassium: 4.8 mEq/L (ref 3.5–5.1)
Sodium: 141 mEq/L (ref 135–145)

## 2011-07-19 LAB — T4, FREE: Free T4: 1.45 ng/dL (ref 0.60–1.60)

## 2011-07-19 LAB — TSH: TSH: 2.45 u[IU]/mL (ref 0.35–5.50)

## 2011-07-23 ENCOUNTER — Encounter (INDEPENDENT_AMBULATORY_CARE_PROVIDER_SITE_OTHER): Payer: Medicare Other

## 2011-07-23 ENCOUNTER — Telehealth: Payer: Self-pay | Admitting: Internal Medicine

## 2011-07-23 DIAGNOSIS — I4891 Unspecified atrial fibrillation: Secondary | ICD-10-CM

## 2011-07-24 ENCOUNTER — Telehealth: Payer: Self-pay | Admitting: Cardiology

## 2011-07-24 DIAGNOSIS — I4891 Unspecified atrial fibrillation: Secondary | ICD-10-CM

## 2011-07-24 NOTE — Telephone Encounter (Signed)
I spoke with pt about elevated TSH possibly related to amiodarone and referral to Dr. Sharl Ma at [facility]. She is aware there will be an appt made and someone will be contacting her  ( cell #  [id number]) .  I reassured her. Mylo Red RN

## 2011-07-24 NOTE — Telephone Encounter (Signed)
Pt returning call from Debbie from yesterday. Please call back.

## 2011-07-25 NOTE — Telephone Encounter (Signed)
Pt going out of town tomorrow and needs refill asap today

## 2011-07-30 ENCOUNTER — Telehealth: Payer: Self-pay | Admitting: Cardiology

## 2011-07-30 NOTE — Telephone Encounter (Signed)
Patient states someone call her , returning call back .

## 2011-08-02 ENCOUNTER — Telehealth: Payer: Self-pay

## 2011-08-02 NOTE — Telephone Encounter (Signed)
The pt called back and I gave her the results of her monitor.  I also instructed her to hold her Metoprolol at this time.  I made the pt aware that Dr Daleen Squibb is not in the office again until 08/13/11 to review the monitor.  The pt would like Debbie RN to call her on her cell phone after Dr Daleen Squibb reviews the monitor and let her know what Dr Daleen Squibb recommended.

## 2011-08-02 NOTE — Telephone Encounter (Signed)
Tereso Newcomer PA-C reviewed monitor:  Bradycardia with brief bursts (rare) SVT, pauses <2.5 seconds (longest 2.1 seconds), PVC's, transient junctional rhythm, Please show Dr Wall----Per Lorin Picket the pt's heart rate was slow at times and he would like the pt to hold Metoprolol for now.  I left a message for the pt to call the office.   Monitor placed on message nurse cart.

## 2011-08-08 ENCOUNTER — Other Ambulatory Visit: Payer: Self-pay | Admitting: Internal Medicine

## 2011-08-09 NOTE — Telephone Encounter (Signed)
Dr Lucianne Muss called the office because he is getting ready to see the pt per Dr Vern Claude request for hyperthyroidism.  He called because he was unsure why he was seeing the pt when her thyroid labs were normal.  I reviewed the pt's chart while on the phone with Dr Lucianne Muss and made him aware that the pt's lab work was also normal in March.  I reviewed Dr Vern Claude office note and did not see any documentation about hyperthyroidism.  I made Dr Lucianne Muss aware that the pt is taking amiodarone and has been having issues with her heart rate and that is most likely why the pt was referred for evaluation.

## 2011-08-14 ENCOUNTER — Telehealth: Payer: Self-pay | Admitting: *Deleted

## 2011-08-14 NOTE — Telephone Encounter (Signed)
LMTCB  Monitor results Mylo Red RN

## 2011-08-14 NOTE — Telephone Encounter (Signed)
I spoke with pt and she states she is feeling much better since discontinuing metoprolol.  " I no longer have the leadness in my feet" Mylo Red RN

## 2011-08-14 NOTE — Telephone Encounter (Signed)
Pt is returning your call couldn't get to phone in time

## 2011-09-27 ENCOUNTER — Ambulatory Visit (INDEPENDENT_AMBULATORY_CARE_PROVIDER_SITE_OTHER): Payer: Medicare Other | Admitting: Internal Medicine

## 2011-09-27 ENCOUNTER — Encounter: Payer: Self-pay | Admitting: Internal Medicine

## 2011-09-27 DIAGNOSIS — J069 Acute upper respiratory infection, unspecified: Secondary | ICD-10-CM

## 2011-09-27 DIAGNOSIS — I4891 Unspecified atrial fibrillation: Secondary | ICD-10-CM

## 2011-09-27 DIAGNOSIS — E669 Obesity, unspecified: Secondary | ICD-10-CM

## 2011-09-27 NOTE — Progress Notes (Signed)
  Subjective:    Patient ID: Gina Reilly, female    DOB: [birth year], 77 y.o.   MRN: [id number]  HPI An 77 year old patient who has a history of atrial fibrillation. She presents with a three-day history of congestion and cough.  Had close contact with her granddaughter who had similar illness over the Thanksgiving holidays she has had no fever shortness of breath chest pain or significant sputum production. She does have low volume cough yielding scant amount of yellow secretions. Medical regimen includes amiodarone and Pradaxa. Denies any hemoptysis. Review of Systems  Constitutional: Positive for fatigue.  HENT: Positive for congestion. Negative for hearing loss, sore throat, rhinorrhea, dental problem, sinus pressure and tinnitus.   Eyes: Negative for pain, discharge and visual disturbance.  Respiratory: Positive for cough. Negative for shortness of breath.   Cardiovascular: Negative for chest pain, palpitations and leg swelling.  Gastrointestinal: Negative for nausea, vomiting, abdominal pain, diarrhea, constipation, blood in stool and abdominal distention.  Genitourinary: Negative for dysuria, urgency, frequency, hematuria, flank pain, vaginal bleeding, vaginal discharge, difficulty urinating, vaginal pain and pelvic pain.  Musculoskeletal: Negative for joint swelling, arthralgias and gait problem.  Skin: Negative for rash.  Neurological: Negative for dizziness, syncope, speech difficulty, weakness, numbness and headaches.  Hematological: Negative for adenopathy.  Psychiatric/Behavioral: Negative for behavioral problems, dysphoric mood and agitation. The patient is not nervous/anxious.        Objective:   Physical Exam  Constitutional: She is oriented to person, place, and time. She appears well-developed and well-nourished. No distress.        Elderly Obese no distress Blood pressure 160/90  HENT:  Dials: Normocephalic.  Right Ear: External ear normal.  Left Ear: External ear  normal.  Mouth/Throat: Oropharynx is clear and moist.  Eyes: Conjunctivae and EOM are normal. Pupils are equal, round, and reactive to light.  Neck: Normal range of motion. Neck supple. No thyromegaly present.  Cardiovascular: Normal rate, normal heart sounds and intact distal pulses.        Rhythm is irregular  Pulmonary/Chest: Effort normal and breath sounds normal. No respiratory distress. She has no wheezes. She has no rales.  Abdominal: Soft. Bowel sounds are normal. She exhibits no mass. There is no tenderness.  Musculoskeletal: Normal range of motion.  Lymphadenopathy:    She has no cervical adenopathy.  Neurological: She is alert and oriented to person, place, and time.  Skin: Skin is warm and dry. No rash noted.  Psychiatric: She has a normal mood and affect. Her behavior is normal.          Assessment & Plan:   URI. Will continue symptomatic treatment with Tylenol and Mucinex Atrial fibrillation. Unclear whether this represents sinus rhythm with frequent PACs. Followup cardiology Hypertension blood pressure up a bit today since taper and discontinuation of metoprolol.

## 2011-09-27 NOTE — Patient Instructions (Signed)
Get plenty of rest, Drink lots of  clear liquids, and use Tylenol  for fever and discomfort.    Continue Mucinex twice daily  Call if you develop  fever or shortness of breath  Limit your sodium (Salt) intake  Please check your blood pressure on a regular basis.  If it is consistently greater than 150/90, please make an office appointment.  Return in one month for follow-up

## 2011-10-24 ENCOUNTER — Other Ambulatory Visit: Payer: Self-pay | Admitting: Internal Medicine

## 2011-11-12 ENCOUNTER — Ambulatory Visit (INDEPENDENT_AMBULATORY_CARE_PROVIDER_SITE_OTHER): Payer: Medicare Other | Admitting: Cardiology

## 2011-11-12 ENCOUNTER — Encounter: Payer: Self-pay | Admitting: Cardiology

## 2011-11-12 DIAGNOSIS — Z79899 Other long term (current) drug therapy: Secondary | ICD-10-CM

## 2011-11-12 DIAGNOSIS — I4891 Unspecified atrial fibrillation: Secondary | ICD-10-CM | POA: Diagnosis not present

## 2011-11-12 NOTE — Progress Notes (Signed)
HPI Gina Reilly comes in today for evaluation and management of her paroxysmal A. Fib. She had significant congestion and sinus drainage associated with fatigue and shortness of breath over the holidays. She is getting better now. Is worried that her heart was giving her trouble.  She denies any palpitations, presyncope, syncope. She denies any edema or orthopnea.    Past Medical History  Diagnosis Date  . Anemia   . GERD (gastroesophageal reflux disease)   . Thyroid disease     hypothroidism  . Arthritis     osteo  . Hyperlipidemia   . Vitamin D deficiency     Current Outpatient Prescriptions  Medication Sig Dispense Refill  . amiodarone (PACERONE) 200 MG tablet TAKE 1 TABLET TWICE DAILY  60 tablet  2  . Multiple Vitamin (MULTIVITAMIN) tablet Take 1 tablet by mouth daily.        Marland Kitchen PRADAXA 150 MG CAPS TAKE ONE CAPSULE TWICE A DAY  60 capsule  5    No Known Allergies  Family History  Problem Relation Age of Onset  . Stroke      family hx    History   Social History  . Marital Status: Widowed    Spouse Name: N/A    Number of Children: N/A  . Years of Education: N/A   Occupational History  . Not on file.   Social History Main Topics  . Smoking status: Former Games developer  . Smokeless tobacco: Never Used  . Alcohol Use: Yes  . Drug Use: No  . Sexually Active: Not on file   Other Topics Concern  . Not on file   Social History Narrative  . No narrative on file    ROS ALL NEGATIVE EXCEPT THOSE NOTED IN HPI  PE  General Appearance: well developed, well nourished in no acute distress, obese HEENT: symmetrical face, PERRLA, good dentition  Neck: no JVD, thyromegaly, or adenopathy, trachea midline Chest: symmetric without deformity Cardiac: PMI non-displaced, RRR, normal S1, S2, no gallop or murmur Lung: clear to ausculation and percussion Vascular: all pulses full without bruits  Abdominal: nondistended, nontender, good bowel sounds, no HSM, no bruits Extremities:  no cyanosis, clubbing or edema, no sign of DVT, no varicosities  Skin: normal color, no rashes Neuro: alert and oriented x 3, non-focal Pysch: normal affect  EKG Sinus bradycardia, first degree AV block, left axis deviation, minor criteria for LVH, poor R. Progression of R waves, no acute changes BMET    Component Value Date/Time   NA 141 07/19/2011 1207   K 4.8 07/19/2011 1207   CL 108 07/19/2011 1207   CO2 24 07/19/2011 1207   GLUCOSE 96 07/19/2011 1207   BUN 25* 07/19/2011 1207   CREATININE 1.2 07/19/2011 1207   CALCIUM 8.9 07/19/2011 1207   GFRNONAA 50* 08/14/2010 1845   GFRAA  Value: >60        The eGFR has been calculated using the MDRD equation. This calculation has not been validated in all clinical situations. eGFR's persistently <60 mL/min signify possible Chronic Kidney Disease. 08/14/2010 1845    Lipid Panel     Component Value Date/Time   CHOL  Value: 146        ATP III CLASSIFICATION:  <200     mg/dL   Desirable  295-284  mg/dL   Borderline High  >=132    mg/dL   High        44/10/270 0206   TRIG 57 08/15/2010 0206   HDL 54  08/15/2010 0206   CHOLHDL 2.7 08/15/2010 0206   VLDL 11 08/15/2010 0206   LDLCALC  Value: 81        Total Cholesterol/HDL:CHD Risk Coronary Heart Disease Risk Table                     Men   Women  1/2 Average Risk   3.4   3.3  Average Risk       5.0   4.4  2 X Average Risk   9.6   7.1  3 X Average Risk  23.4   11.0        Use the calculated Patient Ratio above and the CHD Risk Table to determine the patient's CHD Risk.        ATP III CLASSIFICATION (LDL):  <100     mg/dL   Optimal  578-469  mg/dL   Near or Above                    Optimal  130-159  mg/dL   Borderline  629-528  mg/dL   High  >413     mg/dL   Very High 24/40/1027 0206    CBC    Component Value Date/Time   WBC 8.0 08/17/2010 0351   RBC 4.06 08/17/2010 0351   HGB 12.7 08/17/2010 0351   HCT 38.3 08/17/2010 0351   PLT 160 08/17/2010 0351   MCV 94.3 08/17/2010 0351   MCH 31.3 08/17/2010  0351   MCHC 33.2 08/17/2010 0351   RDW 13.2 08/17/2010 0351   LYMPHSABS 2.2 08/14/2010 1845   MONOABS 0.8 08/14/2010 1845   EOSABS 0.1 08/14/2010 1845   BASOSABS 0.0 08/14/2010 1845

## 2011-11-12 NOTE — Patient Instructions (Signed)
Your physician recommends that you return for lab work in March 2013 for the following labs:  BMET, TSH, FreeT4, Liver Panel.  Your physician wants you to follow-up in: 6 months. You will receive a reminder letter in the mail two months in advance. If you don't receive a letter, please call our office to schedule the follow-up appointment.

## 2011-11-12 NOTE — Assessment & Plan Note (Signed)
Maintaining sinus rhythm on low dose amiodarone. No change in therapy. Repeat amiodarone labs in March and see me back in 6 months.

## 2011-11-15 NOTE — Progress Notes (Signed)
Addended by: Judithe Modest D on: 11/15/2011 12:06 PM   Modules accepted: Orders

## 2011-12-25 ENCOUNTER — Ambulatory Visit (INDEPENDENT_AMBULATORY_CARE_PROVIDER_SITE_OTHER): Payer: Medicare Other | Admitting: Family Medicine

## 2011-12-25 ENCOUNTER — Encounter: Payer: Self-pay | Admitting: Family Medicine

## 2011-12-25 DIAGNOSIS — E669 Obesity, unspecified: Secondary | ICD-10-CM

## 2011-12-25 DIAGNOSIS — C4490 Unspecified malignant neoplasm of skin, unspecified: Secondary | ICD-10-CM

## 2011-12-25 DIAGNOSIS — L989 Disorder of the skin and subcutaneous tissue, unspecified: Secondary | ICD-10-CM | POA: Insufficient documentation

## 2011-12-25 DIAGNOSIS — R1900 Intra-abdominal and pelvic swelling, mass and lump, unspecified site: Secondary | ICD-10-CM | POA: Diagnosis not present

## 2011-12-25 DIAGNOSIS — J40 Bronchitis, not specified as acute or chronic: Secondary | ICD-10-CM | POA: Diagnosis not present

## 2011-12-25 DIAGNOSIS — E039 Hypothyroidism, unspecified: Secondary | ICD-10-CM | POA: Diagnosis not present

## 2011-12-25 DIAGNOSIS — K219 Gastro-esophageal reflux disease without esophagitis: Secondary | ICD-10-CM | POA: Diagnosis not present

## 2011-12-25 DIAGNOSIS — E785 Hyperlipidemia, unspecified: Secondary | ICD-10-CM

## 2011-12-25 DIAGNOSIS — I498 Other specified cardiac arrhythmias: Secondary | ICD-10-CM

## 2011-12-25 DIAGNOSIS — I10 Essential (primary) hypertension: Secondary | ICD-10-CM

## 2011-12-25 DIAGNOSIS — D649 Anemia, unspecified: Secondary | ICD-10-CM

## 2011-12-25 DIAGNOSIS — I4891 Unspecified atrial fibrillation: Secondary | ICD-10-CM

## 2011-12-25 DIAGNOSIS — N83209 Unspecified ovarian cyst, unspecified side: Secondary | ICD-10-CM | POA: Insufficient documentation

## 2011-12-25 DIAGNOSIS — IMO0001 Reserved for inherently not codable concepts without codable children: Secondary | ICD-10-CM

## 2011-12-25 HISTORY — DX: Bronchitis, not specified as acute or chronic: J40

## 2011-12-25 MED ORDER — HYDROCOD POLST-CHLORPHEN POLST 10-8 MG/5ML PO LQCR: 5.0000 mL | Freq: Every evening | ORAL | Status: DC | PRN

## 2011-12-25 MED ORDER — AMOXICILLIN-POT CLAVULANATE 875-125 MG PO TABS: 1.0000 | ORAL_TABLET | Freq: Two times a day (BID) | ORAL | Status: AC

## 2011-12-25 MED ORDER — RANITIDINE HCL 150 MG PO TABS: 150.0000 mg | ORAL_TABLET | Freq: Every day | ORAL | Status: DC

## 2011-12-25 MED ORDER — GUAIFENESIN ER 600 MG PO TB12: 600.0000 mg | ORAL_TABLET | Freq: Two times a day (BID) | ORAL | Status: DC

## 2011-12-25 NOTE — Patient Instructions (Signed)
Bronchitis Bronchitis is the body's way of reacting to injury and/or infection (inflammation) of the bronchi. Bronchi are the air tubes that extend from the windpipe into the lungs. If the inflammation becomes severe, it may cause shortness of breath. CAUSES  Inflammation may be caused by:  A virus.   Germs (bacteria).   Dust.   Allergens.   Pollutants and many other irritants.  The cells lining the bronchial tree are covered with tiny hairs (cilia). These constantly beat upward, away from the lungs, toward the mouth. This keeps the lungs free of pollutants. When these cells become too irritated and are unable to do their job, mucus begins to develop. This causes the characteristic cough of bronchitis. The cough clears the lungs when the cilia are unable to do their job. Without either of these protective mechanisms, the mucus would settle in the lungs. Then you would develop pneumonia. Smoking is a common cause of bronchitis and can contribute to pneumonia. Stopping this habit is the single most important thing you can do to help yourself. TREATMENT   Your caregiver may prescribe an antibiotic if the cough is caused by bacteria. Also, medicines that open up your airways make it easier to breathe. Your caregiver may also recommend or prescribe an expectorant. It will loosen the mucus to be coughed up. Only take over-the-counter or prescription medicines for pain, discomfort, or fever as directed by your caregiver.   Removing whatever causes the problem (smoking, for example) is critical to preventing the problem from getting worse.   Cough suppressants may be prescribed for relief of cough symptoms.   Inhaled medicines may be prescribed to help with symptoms now and to help prevent problems from returning.   For those with recurrent (chronic) bronchitis, there may be a need for steroid medicines.  SEEK IMMEDIATE MEDICAL CARE IF:   During treatment, you develop more pus-like mucus  (purulent sputum).   You have a fever.   Your baby is older than 3 months with a rectal temperature of 102 F (38.9 C) or higher.   Your baby is 63 months old or younger with a rectal temperature of 100.4 F (38 C) or higher.   You become progressively more ill.   You have increased difficulty breathing, wheezing, or shortness of breath.  It is necessary to seek immediate medical care if you are elderly or sick from any other disease. MAKE SURE YOU:   Understand these instructions.   Will watch your condition.   Will get help right away if you are not doing well or get worse.  Document Released: 10/14/2005 Document Revised: 06/26/2011 Document Reviewed: 08/23/2008 [facility] Patient Information 2012 [place], [place]. Eat a yogurt daily and increase water intake  Consider DASH

## 2011-12-25 NOTE — Assessment & Plan Note (Signed)
Lipid panel ordered for later in the month, await results before initiating any therapy

## 2011-12-25 NOTE — Progress Notes (Signed)
Patient ID: Gina Reilly, female   DOB: [birth year], 78 y.o.   MRN: [id number] Gina Reilly [id number] 05-14-1924 12/25/2011      Progress Note New Patient  Subjective  Chief Complaint  Chief Complaint  Patient presents with  . Establish Care    new patient- transfer from [facility]    HPI  Is an 78 year old Caucasian female who is in today to establish care. She was seen once by this physician on an urgent basis in 2001. At that time she was found to fibrillation with rapid ventricular response and was sent to the emergency room. Since then she's been following with cardiology and is doing much better. She is presently maintained on amiodarone and Pradaxa. She is tolerating the medications and very nicely weight control. She is concerned Pradaxa has constant trouble with heavy feeling in her feet she offers no other complaints. Denies any rectal bleeding or signs of easy bruising or bleeding otherwise. She is certainly much happier with this medication than she was with Coumadin. She is concerned about a persistent cough she says she's been struggling with a couple of months. It comes and goes but is present to some degree of. It is productive of phlegm at times. He tends to be worse at night and proceed in the morning. She has had some nasal congestion and epistaxis as well. She denies any fevers or chills. Has not tried any over-the-counter medications so far. She denied chest pain, palpitations, shortness of breath GI or GU complaints otherwise.  Past Medical History  Diagnosis Date  . Anemia   . GERD (gastroesophageal reflux disease)   . Thyroid disease     hypothroidism  . Arthritis     osteo  . Hyperlipidemia   . Vitamin d deficiency   . Chicken pox as a child  . Mumps as a child  . Measles as a child  . Hypertension   . Skin cancer     left temple  . Obese   . Ovarian cyst   . Pelvic mass in female     Past Surgical History  Procedure Date  . Cataract extraction     Dr.  Burgess Estelle  . Total hip arthroplasty 2003    left  . Abdominal hysterectomy   . Skin biopsy     left temple. cancerous    Family History  Problem Relation Age of Onset  . Stroke Mother   . Cancer Father     prostate  . Hypertension Brother   . Cancer Maternal Grandmother   . Hypertension Brother   . Heart disease Brother   . Obesity Brother   . Hypertension Brother   . Hyperlipidemia Brother   . Heart disease Brother   . Kidney disease Brother   . Cancer Daughter     kidney- spine and blood stream  . Heart disease Son     valvular heart disease    History   Social History  . Marital Status: Widowed    Spouse Name: N/A    Number of Children: N/A  . Years of Education: N/A   Occupational History  . Not on file.   Social History Main Topics  . Smoking status: Former Games developer  . Smokeless tobacco: Never Used  . Alcohol Use: Yes  . Drug Use: No  . Sexually Active: Not on file   Other Topics Concern  . Not on file   Social History Narrative  . No narrative on file  Current Outpatient Prescriptions on File Prior to Visit  Medication Sig Dispense Refill  . amiodarone (PACERONE) 200 MG tablet TAKE 1 TABLET TWICE DAILY  60 tablet  2  . Multiple Vitamin (MULTIVITAMIN) tablet Take 1 tablet by mouth daily.        Marland Kitchen PRADAXA 150 MG CAPS TAKE ONE CAPSULE TWICE A DAY  60 capsule  5    No Known Allergies  Review of Systems  Review of Systems  Constitutional: Positive for malaise/fatigue. Negative for fever and chills.  HENT: Positive for nosebleeds and congestion. Negative for hearing loss.   Eyes: Negative for discharge.  Respiratory: Positive for cough and sputum production. Negative for shortness of breath and wheezing.   Cardiovascular: Negative for chest pain, palpitations and leg swelling.  Gastrointestinal: Negative for heartburn, nausea, vomiting, abdominal pain, diarrhea, constipation and blood in stool.  Genitourinary: Negative for dysuria, urgency,  frequency and hematuria.  Musculoskeletal: Negative for myalgias, back pain and falls.  Skin: Negative for rash.  Neurological: Positive for sensory change. Negative for dizziness, tremors, focal weakness, loss of consciousness, weakness and headaches.       C/o her feet feeling heavy and decreasing her ability to ambulate. She relates it back to starting Pradaxa  Endo/Heme/Allergies: Negative for polydipsia. Does not bruise/bleed easily.  Psychiatric/Behavioral: Negative for depression and suicidal ideas. The patient is not nervous/anxious and does not have insomnia.     Objective  BP 162/84  Pulse 53  Temp(Src) 97.8 F (36.6 C) (Temporal)  Ht 5\' 7"  (1.702 m)  Wt 227 lb (102.967 kg)  BMI 35.55 kg/m2  SpO2 93%  LMP 12/25/2011  Physical Exam  Physical Exam  Constitutional: She is oriented to person, place, and time and well-developed, well-nourished, and in no distress. No distress.  HENT:  [place]: Normocephalic and atraumatic.  Right Ear: External ear normal.  Left Ear: External ear normal.  Nose: Nose normal.  Mouth/Throat: Oropharynx is clear and moist. No oropharyngeal exudate.  Eyes: Conjunctivae are normal. Pupils are equal, round, and reactive to light. Right eye exhibits no discharge. Left eye exhibits no discharge. No scleral icterus.  Neck: Normal range of motion. Neck supple. No thyromegaly present.  Cardiovascular: Normal heart sounds and intact distal pulses.   No murmur heard.      Bradycardia, irregular  Pulmonary/Chest: Effort normal and breath sounds normal. No respiratory distress. She has no wheezes. She has no rales.  Abdominal: Soft. Bowel sounds are normal. She exhibits no distension and no mass. There is no tenderness.  Musculoskeletal: Normal range of motion. She exhibits no edema and no tenderness.  Lymphadenopathy:    She has no cervical adenopathy.  Neurological: She is alert and oriented to person, place, and time. She has normal reflexes. No cranial  nerve deficit. Coordination normal.  Skin: Skin is warm and dry. No rash noted. She is not diaphoretic.  Psychiatric: Mood, memory and affect normal.       Assessment & Plan  Pelvic mass in female Patient asymptomatic but did have pelvic mass noted on CT scan in 2011, discussed with patient possibility of further imaging when she returns.  HYPOTHYROIDISM Has history noted in chart but most recent labs normal. Will continue to monitor.  Skin cancer She believes she had a skin cancer removed from her left temple by Dr Terri Piedra but she is unsure which kind. Will follow up with dermatology  Hypertension Patient not on any meds and hesitant to start any, is given a copy of the  DASH diet and asked to minimize sodium, reassess at next visit in March  ATRIAL FIBRILLATION Rate controlled and on Pradaxa, doing well, has appt for labs and f/u with Dr Daleen Squibb next month  BRADYCARDIA Continues to be mildly bradycardic but asymptomatic  EXOGENOUS OBESITY Encouraged DASH diet and increased movement as tolerated  HYPERLIPIDEMIA Lipid panel ordered for later in the month, await results before initiating any therapy  Bronchitis Start antibioitcs, Mucinex and cough suppressant, increase hydration and rest and reassess next visit  GERD Start Ranitidine 150 mg qhs to see if this helps to quiet her cough   Greater than 60 minutes is spent with this patient

## 2011-12-25 NOTE — Assessment & Plan Note (Signed)
She believes she had a skin cancer removed from her left temple by Dr Terri Piedra but she is unsure which kind. Will follow up with dermatology

## 2011-12-25 NOTE — Assessment & Plan Note (Signed)
Start Ranitidine 150 mg qhs to see if this helps to quiet her cough

## 2011-12-25 NOTE — Assessment & Plan Note (Signed)
Patient not on any meds and hesitant to start any, is given a copy of the DASH diet and asked to minimize sodium, reassess at next visit in March

## 2011-12-25 NOTE — Assessment & Plan Note (Signed)
Continues to be mildly bradycardic but asymptomatic

## 2011-12-25 NOTE — Assessment & Plan Note (Signed)
Start antibioitcs, Mucinex and cough suppressant, increase hydration and rest and reassess next visit

## 2011-12-25 NOTE — Assessment & Plan Note (Signed)
Rate controlled and on Pradaxa, doing well, has appt for labs and f/u with Dr Daleen Squibb next month

## 2011-12-25 NOTE — Assessment & Plan Note (Signed)
Encouraged DASH diet and increased movement as tolerated

## 2011-12-25 NOTE — Assessment & Plan Note (Signed)
Has history noted in chart but most recent labs normal. Will continue to monitor.

## 2011-12-25 NOTE — Assessment & Plan Note (Signed)
Patient asymptomatic but did have pelvic mass noted on CT scan in 2011, discussed with patient possibility of further imaging when she returns.

## 2012-01-08 ENCOUNTER — Other Ambulatory Visit (INDEPENDENT_AMBULATORY_CARE_PROVIDER_SITE_OTHER): Payer: Medicare Other

## 2012-01-08 DIAGNOSIS — D649 Anemia, unspecified: Secondary | ICD-10-CM | POA: Diagnosis not present

## 2012-01-08 DIAGNOSIS — Z79899 Other long term (current) drug therapy: Secondary | ICD-10-CM | POA: Diagnosis not present

## 2012-01-08 DIAGNOSIS — I4891 Unspecified atrial fibrillation: Secondary | ICD-10-CM

## 2012-01-08 DIAGNOSIS — E785 Hyperlipidemia, unspecified: Secondary | ICD-10-CM

## 2012-01-08 LAB — HEPATIC FUNCTION PANEL
AST: 29 U/L (ref 0–37)
Alkaline Phosphatase: 64 U/L (ref 39–117)
Bilirubin, Direct: 0.1 mg/dL (ref 0.0–0.3)
Total Bilirubin: 0.4 mg/dL (ref 0.3–1.2)

## 2012-01-08 LAB — CBC
MCHC: 33 g/dL (ref 30.0–36.0)
RDW: 14.9 % — ABNORMAL HIGH (ref 11.5–14.6)
WBC: 8.9 10*3/uL (ref 4.5–10.5)

## 2012-01-08 LAB — LIPID PANEL
HDL: 59.7 mg/dL (ref >39.00)
LDL Cholesterol: 86 mg/dL (ref 0–99)
Total CHOL/HDL Ratio: 3
Triglycerides: 77 mg/dL (ref 0.0–149.0)

## 2012-01-08 LAB — BASIC METABOLIC PANEL
BUN: 16 mg/dL (ref 6–23)
CO2: 24 mEq/L (ref 19–32)
Chloride: 104 mEq/L (ref 96–112)
GFR: 60 mL/min — ABNORMAL LOW (ref >60.00)
Glucose, Bld: 145 mg/dL — ABNORMAL HIGH (ref 70–99)
Potassium: 4.1 mEq/L (ref 3.5–5.1)
Sodium: 137 mEq/L (ref 135–145)

## 2012-01-14 ENCOUNTER — Ambulatory Visit (INDEPENDENT_AMBULATORY_CARE_PROVIDER_SITE_OTHER): Payer: Medicare Other | Admitting: Family Medicine

## 2012-01-14 ENCOUNTER — Ambulatory Visit (HOSPITAL_BASED_OUTPATIENT_CLINIC_OR_DEPARTMENT_OTHER)
Admission: RE | Admit: 2012-01-14 | Discharge: 2012-01-14 | Disposition: A | Discharge status: Home / Self Care | Payer: Medicare Other | Source: Ambulatory Visit | Attending: Family Medicine | Admitting: Family Medicine

## 2012-01-14 ENCOUNTER — Encounter: Payer: Self-pay | Admitting: Family Medicine

## 2012-01-14 ENCOUNTER — Other Ambulatory Visit: Payer: Self-pay | Admitting: Family Medicine

## 2012-01-14 VITALS — BP 174/94 | HR 56 | Temp 97.6°F | Ht 67.0 in | Wt 228.0 lb

## 2012-01-14 DIAGNOSIS — M79609 Pain in unspecified limb: Secondary | ICD-10-CM

## 2012-01-14 DIAGNOSIS — I10 Essential (primary) hypertension: Secondary | ICD-10-CM | POA: Diagnosis not present

## 2012-01-14 DIAGNOSIS — T148XXA Other injury of unspecified body region, initial encounter: Secondary | ICD-10-CM

## 2012-01-14 DIAGNOSIS — X58XXXA Exposure to other specified factors, initial encounter: Secondary | ICD-10-CM | POA: Diagnosis not present

## 2012-01-14 DIAGNOSIS — M7730 Calcaneal spur, unspecified foot: Secondary | ICD-10-CM | POA: Diagnosis not present

## 2012-01-14 DIAGNOSIS — M25579 Pain in unspecified ankle and joints of unspecified foot: Secondary | ICD-10-CM

## 2012-01-14 DIAGNOSIS — S8012XA Contusion of left lower leg, initial encounter: Secondary | ICD-10-CM

## 2012-01-14 DIAGNOSIS — I4891 Unspecified atrial fibrillation: Secondary | ICD-10-CM

## 2012-01-14 MED ORDER — CHLORTHALIDONE 25 MG PO TABS: 25.0000 mg | ORAL_TABLET | Freq: Every day | ORAL | Status: DC

## 2012-01-14 NOTE — Patient Instructions (Addendum)
Bone Bruise  A bone bruise is a small hidden fracture of the bone. It typically occurs with bones located close to the surface of the skin.  SYMPTOMS  The pain lasts longer than a normal bruise.   The bruised area is difficult to use.   There may be discoloration or swelling of the bruised area.   When a bone bruise is found with injury to the anterior cruciate ligament (in the knee) there is often an increased:   Amount of fluid in the knee   Time the fluid in the knee lasts.   Number of days until you are walking normally and regaining the motion you had before the injury.   Number of days with pain from the injury.  DIAGNOSIS  It can only be seen on X-rays known as MRIs. This stands for magnetic resonance imaging. A regular X-ray taken of a bone bruise would appear to be normal. A bone bruise is a common injury in the knee and the heel bone (calcaneus). The problems are similar to those produced by stress fractures, which are bone injuries caused by overuse. A bone bruise may also be a sign of other injuries. For example, bone bruises are commonly found where an anterior cruciate ligament (ACL) in the knee has been pulled away from the bone (ruptured). A ligament is a tough fibrous material that connects bones together to make our joints stable. Bruises of the bone last a lot longer than bruises of the muscle or tissues beneath the skin. Bone bruises can last from days to months and are often more severe and painful than other bruises. TREATMENT Because bone bruises are sudden injuries you cannot often prevent them, other than by being extremely careful. Some things you can do to improve the condition are:  Apply ice to the sore area for 15 to 20 minutes, 3 to 4 times per day while awake for the first 2 days. Put the ice in a plastic bag, and place a towel between the bag of ice and your skin.   Keep your bruised area raised (elevated) when possible to lessen swelling.   For activity:     Use crutches when necessary; do not put weight on the injured leg until you are no longer tender.   You may walk on your affected part as the pain allows, or as instructed.   Start weight bearing gradually on the bruised part.   Continue to use crutches or a cane until you can stand without causing pain, or as instructed.   If a plaster splint was applied, wear the splint until you are seen for a follow-up examination. Rest it on nothing harder than a pillow the first 24 hours. Do not put weight on it. Do not get it wet. You may take it off to take a shower or bath.   If an air splint was applied, more air may be blown into or out of the splint as needed for comfort. You may take it off at night and to take a shower or bath.   Wiggle your toes in the splint several times per day if you are able.   You may have been given an elastic bandage to use with the plaster splint or alone. The splint is too tight if you have numbness, tingling or if your foot becomes cold and blue. Adjust the bandage to make it comfortable.   Only take over-the-counter or prescription medicines for pain, discomfort, or fever as directed by   your caregiver.   Follow all instructions for follow up with your caregiver. This includes any orthopedic referrals, physical therapy, and rehabilitation. Any delay in obtaining necessary care could result in a delay or failure of the bones to heal.  SEEK MEDICAL CARE IF:   You have an increase in bruising, swelling, or pain.   You notice coldness of your toes.   You do not get pain relief with medications.  SEEK IMMEDIATE MEDICAL CARE IF:   Your toes are numb or blue.   You have severe pain not controlled with medications.   If any of the problems that caused you to seek care are becoming worse.  Document Released: 01/04/2004 Document Revised: 10/03/2011 Document Reviewed: 05/18/2008 [facility] Patient Information 2012 [place], [place].   Contusion A contusion is a  deep bruise. Contusions are the result of an injury that caused bleeding under the skin. The contusion may turn blue, purple, or yellow. Minor injuries will give you a painless contusion, but more severe contusions may stay painful and swollen for a few weeks.  CAUSES  A contusion is usually caused by a blow, trauma, or direct force to an area of the body. SYMPTOMS   Swelling and redness of the injured area.   Bruising of the injured area.   Tenderness and soreness of the injured area.   Pain.  DIAGNOSIS  The diagnosis can be made by taking a history and physical exam. An X-ray, CT scan, or MRI may be needed to determine if there were any associated injuries, such as fractures. TREATMENT  Specific treatment will depend on what area of the body was injured. In general, the best treatment for a contusion is resting, icing, elevating, and applying cold compresses to the injured area. Over-the-counter medicines may also be recommended for pain control. Ask your caregiver what the best treatment is for your contusion. HOME CARE INSTRUCTIONS   Put ice on the injured area.   Put ice in a plastic bag.   Place a towel between your skin and the bag.   Leave the ice on for 15 to 20 minutes, 3 to 4 times a day.   Only take over-the-counter or prescription medicines for pain, discomfort, or fever as directed by your caregiver. Your caregiver may recommend avoiding anti-inflammatory medicines (aspirin, ibuprofen, and naproxen) for 48 hours because these medicines may increase bruising.   Rest the injured area.   If possible, elevate the injured area to reduce swelling.  SEEK IMMEDIATE MEDICAL CARE IF:   You have increased bruising or swelling.   You have pain that is getting worse.   Your swelling or pain is not relieved with medicines.  MAKE SURE YOU:   Understand these instructions.   Will watch your condition.   Will get help right away if you are not doing well or get worse.    Document Released: 07/24/2005 Document Revised: 10/03/2011 Document Reviewed: 08/19/2011 [facility] Patient Information 2012 [place], [place].      Elevate foot above heart for 10 minutes 3 x a day and then apply ice and elevate for another 10 minutes 3 x a day. After elevation massage area with Aspercreme 3 x a day

## 2012-01-15 ENCOUNTER — Ambulatory Visit: Payer: Medicare Other | Admitting: Cardiology

## 2012-01-16 ENCOUNTER — Encounter: Payer: Self-pay | Admitting: Family Medicine

## 2012-01-16 DIAGNOSIS — S8012XA Contusion of left lower leg, initial encounter: Secondary | ICD-10-CM | POA: Insufficient documentation

## 2012-01-16 HISTORY — DX: Contusion of left lower leg, initial encounter: S80.12XA

## 2012-01-16 NOTE — Progress Notes (Signed)
Patient ID: Gina Reilly, female   DOB: May 22, 1924, 78 y.o.   MRN: [id number] Vivianne Carles Perin [id number] Jul 31, 1924 01/16/2012      Progress Note-Follow Up  Subjective  Chief Complaint  Chief Complaint  Patient presents with  . knot on leg    bumped leg on step on shuttle bus on 01/07/12, has been elevated and using ice    HPI  Patient is an 29 total Caucasian female who is in today for evaluation of the contusion. She fell on 01/07/2012 while getting on a tour bus and hit the anterior tibial plateau of the left leg. He immediately had pain and swelling over the last few days become worse wall and anteriorly an ecchymotic. No calf pain, shortness of breath, chest pain, palpitations, headache or other signs of complication. No GI or GU complaints. She denies any syncope leading to this episode.  Past Medical History  Diagnosis Date  . Anemia   . GERD (gastroesophageal reflux disease)   . Thyroid disease     hypothroidism  . Arthritis     osteo  . Hyperlipidemia   . Vitamin d deficiency   . Chicken pox as a child  . Mumps as a child  . Measles as a child  . Hypertension   . Skin cancer     left temple  . Obese   . Ovarian cyst   . Pelvic mass in female   . Bronchitis 12/25/2011  . Contusion of leg, left 01/16/2012    Past Surgical History  Procedure Date  . Cataract extraction     Dr. Burgess Estelle  . Total hip arthroplasty 2003    left  . Abdominal hysterectomy   . Skin biopsy     left temple. cancerous    Family History  Problem Relation Age of Onset  . Stroke Mother   . Cancer Father     prostate  . Hypertension Brother   . Cancer Maternal Grandmother   . Hypertension Brother   . Heart disease Brother   . Obesity Brother   . Hypertension Brother   . Hyperlipidemia Brother   . Heart disease Brother   . Kidney disease Brother   . Cancer Daughter     kidney- spine and blood stream  . Heart disease Son     valvular heart disease    History   Social History  .  Marital Status: Widowed    Spouse Name: N/A    Number of Children: N/A  . Years of Education: N/A   Occupational History  . Not on file.   Social History Main Topics  . Smoking status: Former Games developer  . Smokeless tobacco: Never Used  . Alcohol Use: Yes  . Drug Use: No  . Sexually Active: Not on file   Other Topics Concern  . Not on file   Social History Narrative  . No narrative on file    Current Outpatient Prescriptions on File Prior to Visit  Medication Sig Dispense Refill  . amiodarone (PACERONE) 200 MG tablet TAKE 1 TABLET TWICE DAILY  60 tablet  2  . Multiple Vitamin (MULTIVITAMIN) tablet Take 1 tablet by mouth daily.        Marland Kitchen PRADAXA 150 MG CAPS TAKE ONE CAPSULE TWICE A DAY  60 capsule  5  . chlorpheniramine-HYDROcodone (TUSSIONEX) 10-8 MG/5ML LQCR Take 5 mLs by mouth at bedtime as needed.  140 mL  0  . guaiFENesin (MUCINEX) 600 MG 12 hr tablet Take 1 tablet (  600 mg total) by mouth 2 (two) times daily.  20 tablet  0  . ranitidine (ZANTAC) 150 MG tablet Take 1 tablet (150 mg total) by mouth at bedtime.  30 tablet  3    No Known Allergies  Review of Systems  Review of Systems  Constitutional: Negative for fever and malaise/fatigue.  HENT: Negative for congestion.   Eyes: Negative for discharge.  Respiratory: Negative for shortness of breath.   Cardiovascular: Negative for chest pain, palpitations and leg swelling.  Gastrointestinal: Negative for nausea, abdominal pain and diarrhea.  Genitourinary: Negative for dysuria.  Musculoskeletal: Positive for myalgias and joint pain. Negative for falls.       Left anterior leg pain, over mid shin s/p trauma  Skin: Negative for rash.  Neurological: Negative for loss of consciousness and headaches.  Endo/Heme/Allergies: Negative for polydipsia.  Psychiatric/Behavioral: Negative for depression and suicidal ideas. The patient is not nervous/anxious and does not have insomnia.     Objective  BP 174/94  Pulse 56  Temp(Src)  97.6 F (36.4 C) (Temporal)  Ht 5\' 7"  (1.702 m)  Wt 228 lb (103.42 kg)  BMI 35.71 kg/m2  LMP 12/25/2011  Physical Exam  Physical Exam  Constitutional: She is oriented to person, place, and time and well-developed, well-nourished, and in no distress. No distress.  HENT:  [place]: Normocephalic and atraumatic.  Eyes: Conjunctivae are normal.  Neck: Neck supple. No thyromegaly present.  Cardiovascular: Normal heart sounds.   No murmur heard.      Irregularly irregular  Pulmonary/Chest: Effort normal and breath sounds normal. She has no wheezes.  Abdominal: She exhibits no distension and no mass.  Musculoskeletal: Normal range of motion. She exhibits edema and tenderness.       Ecchymosis and swelling over anterior tibial plateau with a calcified portion mildly tender. No posterior calf pain or swelling.  Lymphadenopathy:    She has no cervical adenopathy.  Neurological: She is alert and oriented to person, place, and time.  Skin: Skin is warm and dry. No rash noted. She is not diaphoretic.  Psychiatric: Memory, affect and judgment normal.    Lab Results  Component Value Date   TSH 3.39 01/08/2012   Lab Results  Component Value Date   WBC 8.9 01/08/2012   HGB 12.9 01/08/2012   HCT 39.1 01/08/2012   MCV 92.9 01/08/2012   PLT 335.0 01/08/2012   Lab Results  Component Value Date   CREATININE 0.9 01/08/2012   BUN 16 01/08/2012   NA 137 01/08/2012   K 4.1 01/08/2012   CL 104 01/08/2012   CO2 24 01/08/2012   Lab Results  Component Value Date   ALT 25 01/08/2012   AST 29 01/08/2012   ALKPHOS 64 01/08/2012   BILITOT 0.4 01/08/2012   Lab Results  Component Value Date   CHOL 161 01/08/2012   Lab Results  Component Value Date   HDL 59.70 01/08/2012   Lab Results  Component Value Date   LDLCALC 86 01/08/2012   Lab Results  Component Value Date   TRIG 77.0 01/08/2012   Lab Results  Component Value Date   CHOLHDL 3 01/08/2012     Assessment & Plan  ATRIAL FIBRILLATION Rate  controlled, asymptomatic  Hypertension Poorly controlled, will add Chlorthalidone and reassess at next visit.  Contusion of leg, left Patient slipped while getting on a tour bus on 3/12, struck her left shin very hard, tib/fib and ankle xrays negative for fracture. Encouraged to use ice and Aspercreme,  elevate leg and call if no improvement.

## 2012-01-16 NOTE — Assessment & Plan Note (Signed)
Rate controlled, asymptomatic 

## 2012-01-16 NOTE — Assessment & Plan Note (Signed)
Patient slipped while getting on a tour bus on 3/12, struck her left shin very hard, tib/fib and ankle xrays negative for fracture. Encouraged to use ice and Aspercreme, elevate leg and call if no improvement.

## 2012-01-16 NOTE — Assessment & Plan Note (Signed)
Poorly controlled, will add Chlorthalidone and reassess at next visit.

## 2012-01-22 ENCOUNTER — Ambulatory Visit: Payer: Medicare Other | Admitting: Family Medicine

## 2012-01-27 ENCOUNTER — Other Ambulatory Visit: Payer: Self-pay

## 2012-01-27 MED ORDER — AMIODARONE HCL 200 MG PO TABS: 200.0000 mg | ORAL_TABLET | Freq: Two times a day (BID) | ORAL | Status: DC

## 2012-02-10 ENCOUNTER — Other Ambulatory Visit: Payer: Self-pay | Admitting: Cardiology

## 2012-02-26 ENCOUNTER — Ambulatory Visit (INDEPENDENT_AMBULATORY_CARE_PROVIDER_SITE_OTHER): Payer: Medicare Other | Admitting: Family Medicine

## 2012-02-26 ENCOUNTER — Encounter: Payer: Self-pay | Admitting: Family Medicine

## 2012-02-26 VITALS — BP 166/82 | HR 54 | Temp 97.8°F | Ht 67.0 in | Wt 230.4 lb

## 2012-02-26 DIAGNOSIS — G629 Polyneuropathy, unspecified: Secondary | ICD-10-CM

## 2012-02-26 DIAGNOSIS — G609 Hereditary and idiopathic neuropathy, unspecified: Secondary | ICD-10-CM

## 2012-02-26 DIAGNOSIS — K219 Gastro-esophageal reflux disease without esophagitis: Secondary | ICD-10-CM | POA: Diagnosis not present

## 2012-02-26 DIAGNOSIS — S8010XA Contusion of unspecified lower leg, initial encounter: Secondary | ICD-10-CM

## 2012-02-26 DIAGNOSIS — I10 Essential (primary) hypertension: Secondary | ICD-10-CM | POA: Diagnosis not present

## 2012-02-26 DIAGNOSIS — S8012XA Contusion of left lower leg, initial encounter: Secondary | ICD-10-CM

## 2012-02-26 HISTORY — DX: Polyneuropathy, unspecified: G62.9

## 2012-02-26 MED ORDER — OLMESARTAN MEDOXOMIL 20 MG PO TABS: 20.0000 mg | ORAL_TABLET | Freq: Every day | ORAL | Status: DC

## 2012-02-26 MED ORDER — METOPROLOL TARTRATE 25 MG PO TABS: 12.5000 mg | ORAL_TABLET | Freq: Two times a day (BID) | ORAL | Status: DC

## 2012-02-26 NOTE — Assessment & Plan Note (Signed)
Resolved. Did try applying Aspercreme as suggested and it caused a bad painful dermatitis so she stopped it.

## 2012-02-26 NOTE — Patient Instructions (Signed)

## 2012-02-26 NOTE — Assessment & Plan Note (Signed)
Infrequent symptoms, may use Zantac prn

## 2012-02-26 NOTE — Progress Notes (Signed)
Patient ID: Gina Reilly, female   DOB: 03/30/1924, 78 y.o.   MRN: [id number] Gina Reilly [id number] 09-Jun-1924 02/26/2012      Progress Note-Follow Up  Subjective  Chief Complaint  Chief Complaint  Patient presents with  . Follow-up    6 week on left leg- feels better  . discuss medication    HPI  Patient is an 78 year old Caucasian female who is in today for followup. Her left lower extremity is feeling much better. The contusion has resolved. She did apply Aspercreme as directed and unfortunately this gave her a bad dermatitis. It became very red and inflamed. When she stopped it and this resolved she is left with dry itchy skin in that region. She's complaining of her feet feeling like lead concerning productive 2 years ago this is unchanged does not keep her up in is not truly painful. She denies chest pain, palpitations. She notes when taking chlorthalidone she felt nauseous and had increased heartburn especially with exertion. Since stopping it that is largely resolved. She will occasionally still feel a sense of heartburn exertion but is much less frequent. No true chest pain palpitations shortness of breath. No fevers chills GI or GU complaints. When she didn't tolerate the chlorthalidone, she stopped it and the nausea resolved. She restarted Metoprolol 25 mg at 1/2 tab po bid  Past Medical History  Diagnosis Date  . Anemia   . GERD (gastroesophageal reflux disease)   . Thyroid disease     hypothroidism  . Arthritis     osteo  . Hyperlipidemia   . Vitamin d deficiency   . Chicken pox as a child  . Mumps as a child  . Measles as a child  . Hypertension   . Skin cancer     left temple  . Obese   . Ovarian cyst   . Pelvic mass in female   . Bronchitis 12/25/2011  . Contusion of leg, left 01/16/2012  . Peripheral neuropathy 02/26/2012    Past Surgical History  Procedure Date  . Cataract extraction     Dr. Burgess Estelle  . Total hip arthroplasty 2003    left  . Abdominal  hysterectomy   . Skin biopsy     left temple. cancerous    Family History  Problem Relation Age of Onset  . Stroke Mother   . Cancer Father     prostate  . Hypertension Brother   . Cancer Maternal Grandmother   . Hypertension Brother   . Heart disease Brother   . Obesity Brother   . Hypertension Brother   . Hyperlipidemia Brother   . Heart disease Brother   . Kidney disease Brother   . Cancer Daughter     kidney- spine and blood stream  . Heart disease Son     valvular heart disease    History   Social History  . Marital Status: Widowed    Spouse Name: N/A    Number of Children: N/A  . Years of Education: N/A   Occupational History  . Not on file.   Social History Main Topics  . Smoking status: Former Games developer  . Smokeless tobacco: Never Used  . Alcohol Use: Yes  . Drug Use: No  . Sexually Active: Not on file   Other Topics Concern  . Not on file   Social History Narrative  . No narrative on file    Current Outpatient Prescriptions on File Prior to Visit  Medication Sig Dispense Refill  .  amiodarone (PACERONE) 200 MG tablet Take 1 tablet (200 mg total) by mouth 2 (two) times daily.  60 tablet  4  . Multiple Vitamin (MULTIVITAMIN) tablet Take 1 tablet by mouth daily.        Marland Kitchen PRADAXA 150 MG CAPS TAKE ONE CAPSULE TWICE A DAY  60 capsule  5  . guaiFENesin (MUCINEX) 600 MG 12 hr tablet Take 1 tablet (600 mg total) by mouth 2 (two) times daily.  20 tablet  0  . metoprolol tartrate (LOPRESSOR) 25 MG tablet Take 0.5 tablets (12.5 mg total) by mouth 2 (two) times daily.      Marland Kitchen olmesartan (BENICAR) 20 MG tablet Take 1 tablet (20 mg total) by mouth daily.  14 tablet    . ranitidine (ZANTAC) 150 MG tablet Take 1 tablet (150 mg total) by mouth at bedtime.  30 tablet  3    Allergies  Allergen Reactions  . Aspercreme (Trolamine Salicylate)     Review of Systems  Review of Systems  Constitutional: Negative for fever and malaise/fatigue.  HENT: Negative for  congestion.   Eyes: Negative for discharge.  Respiratory: Negative for shortness of breath.   Cardiovascular: Negative for chest pain, palpitations and leg swelling.  Gastrointestinal: Positive for heartburn and nausea. Negative for abdominal pain and diarrhea.  Genitourinary: Negative for dysuria.  Musculoskeletal: Negative for falls.  Skin: Positive for itching and rash.       Resolving over left lower leg  Neurological: Positive for sensory change. Negative for loss of consciousness and headaches.       Feet feel heavy  Endo/Heme/Allergies: Negative for polydipsia.  Psychiatric/Behavioral: Negative for depression and suicidal ideas. The patient is not nervous/anxious and does not have insomnia.     Objective  BP 166/82  Pulse 54  Temp(Src) 97.8 F (36.6 C) (Temporal)  Ht 5\' 7"  (1.702 m)  Wt 230 lb 6.4 oz (104.509 kg)  BMI 36.09 kg/m2  SpO2 92%  LMP 12/25/2011  Physical Exam  Physical Exam  Constitutional: She is oriented to person, place, and time and well-developed, well-nourished, and in no distress. No distress.  HENT:  Ilg: Normocephalic and atraumatic.  Eyes: Conjunctivae are normal.  Neck: Neck supple. No thyromegaly present.  Cardiovascular: Regular rhythm and normal heart sounds.        bradycardia  Pulmonary/Chest: Effort normal and breath sounds normal. She has no wheezes.  Abdominal: She exhibits no distension and no mass.  Musculoskeletal: She exhibits no edema.  Lymphadenopathy:    She has no cervical adenopathy.  Neurological: She is alert and oriented to person, place, and time.  Skin: Skin is warm and dry. No rash noted. She is not diaphoretic.  Psychiatric: Memory, affect and judgment normal.    Lab Results  Component Value Date   TSH 3.39 01/08/2012   Lab Results  Component Value Date   WBC 8.9 01/08/2012   HGB 12.9 01/08/2012   HCT 39.1 01/08/2012   MCV 92.9 01/08/2012   PLT 335.0 01/08/2012   Lab Results  Component Value Date   CREATININE  0.9 01/08/2012   BUN 16 01/08/2012   NA 137 01/08/2012   K 4.1 01/08/2012   CL 104 01/08/2012   CO2 24 01/08/2012   Lab Results  Component Value Date   ALT 25 01/08/2012   AST 29 01/08/2012   ALKPHOS 64 01/08/2012   BILITOT 0.4 01/08/2012   Lab Results  Component Value Date   CHOL 161 01/08/2012   Lab Results  Component Value Date   HDL 59.70 01/08/2012   Lab Results  Component Value Date   LDLCALC 86 01/08/2012   Lab Results  Component Value Date   TRIG 77.0 01/08/2012   Lab Results  Component Value Date   CHOLHDL 3 01/08/2012     Assessment & Plan  Hypertension Patient did not tolerate the Chlorthalidone, says it made her nauseous. When she stopped it the nausea went away. She put herself back on the Metoprolol 25 mg she already had taking 1/2 tab po bid. She is still running hi so we have given her samples of Benicar 20 mg daily we will see her in a month or as needed  Contusion of leg, left Resolved. Did try applying Aspercreme as suggested and it caused a bad painful dermatitis so she stopped it.  GERD Infrequent symptoms, may use Zantac prn  Peripheral neuropathy Patient describes her feet feel like lead since starting Pradaxa 2 years ago. Not truly painful and not keeping her up, she is to notify us if symptoms worsen.

## 2012-02-26 NOTE — Assessment & Plan Note (Addendum)
Patient did not tolerate the Chlorthalidone, says it made her nauseous. When she stopped it the nausea went away. She put herself back on the Metoprolol 25 mg she already had taking 1/2 tab po bid. She is still running hi so we have given her samples of Benicar 20 mg daily we will see her in a month or as needed

## 2012-02-26 NOTE — Assessment & Plan Note (Signed)
Patient describes her feet feel like lead since starting Pradaxa 2 years ago. Not truly painful and not keeping her up, she is to notify us if symptoms worsen.

## 2012-03-10 ENCOUNTER — Telehealth: Payer: Self-pay

## 2012-03-10 NOTE — Telephone Encounter (Signed)
Patient states the Benicar worn her out and gave her SOB. Patient stopped taking medication on Thursday and has felt much better. Does patient need to come in or make an appt.   Per MD patient would need an appt. appt scheduled

## 2012-03-11 DIAGNOSIS — Z85828 Personal history of other malignant neoplasm of skin: Secondary | ICD-10-CM | POA: Diagnosis not present

## 2012-03-11 DIAGNOSIS — L570 Actinic keratosis: Secondary | ICD-10-CM | POA: Diagnosis not present

## 2012-03-18 ENCOUNTER — Ambulatory Visit (INDEPENDENT_AMBULATORY_CARE_PROVIDER_SITE_OTHER): Payer: Medicare Other | Admitting: Family Medicine

## 2012-03-18 ENCOUNTER — Encounter: Payer: Self-pay | Admitting: Family Medicine

## 2012-03-18 VITALS — BP 166/86 | HR 60 | Temp 97.6°F | Ht 67.0 in | Wt 227.4 lb

## 2012-03-18 DIAGNOSIS — R5381 Other malaise: Secondary | ICD-10-CM

## 2012-03-18 DIAGNOSIS — R7309 Other abnormal glucose: Secondary | ICD-10-CM

## 2012-03-18 DIAGNOSIS — E039 Hypothyroidism, unspecified: Secondary | ICD-10-CM

## 2012-03-18 DIAGNOSIS — R0609 Other forms of dyspnea: Secondary | ICD-10-CM | POA: Diagnosis not present

## 2012-03-18 DIAGNOSIS — R5383 Other fatigue: Secondary | ICD-10-CM

## 2012-03-18 DIAGNOSIS — R0989 Other specified symptoms and signs involving the circulatory and respiratory systems: Secondary | ICD-10-CM

## 2012-03-18 DIAGNOSIS — I10 Essential (primary) hypertension: Secondary | ICD-10-CM

## 2012-03-18 DIAGNOSIS — R0683 Snoring: Secondary | ICD-10-CM

## 2012-03-18 DIAGNOSIS — G629 Polyneuropathy, unspecified: Secondary | ICD-10-CM

## 2012-03-18 DIAGNOSIS — G609 Hereditary and idiopathic neuropathy, unspecified: Secondary | ICD-10-CM

## 2012-03-18 DIAGNOSIS — R350 Frequency of micturition: Secondary | ICD-10-CM

## 2012-03-18 DIAGNOSIS — R739 Hyperglycemia, unspecified: Secondary | ICD-10-CM

## 2012-03-18 LAB — POCT URINALYSIS DIPSTICK
Bilirubin, UA: NEGATIVE
Glucose, UA: NEGATIVE
Ketones, UA: NEGATIVE
Leukocytes, UA: NEGATIVE
Nitrite, UA: NEGATIVE
pH, UA: 6.5

## 2012-03-18 LAB — RENAL FUNCTION PANEL
CO2: 23 mEq/L (ref 19–32)
Calcium: 9.1 mg/dL (ref 8.4–10.5)
Chloride: 104 mEq/L (ref 96–112)
Creatinine, Ser: 0.9 mg/dL (ref 0.4–1.2)
GFR: 59.98 mL/min — ABNORMAL LOW (ref >60.00)
Potassium: 5.2 mEq/L — ABNORMAL HIGH (ref 3.5–5.1)
Sodium: 139 mEq/L (ref 135–145)

## 2012-03-18 LAB — T4, FREE: Free T4: 1.69 ng/dL — ABNORMAL HIGH (ref 0.60–1.60)

## 2012-03-18 LAB — HEMOGLOBIN A1C: Hgb A1c MFr Bld: 6 % (ref 4.6–6.5)

## 2012-03-18 MED ORDER — METOPROLOL TARTRATE 25 MG PO TABS: 12.5000 mg | ORAL_TABLET | Freq: Two times a day (BID) | ORAL | Status: DC

## 2012-03-18 NOTE — Progress Notes (Signed)
Patient ID: Gina Reilly, female   DOB: 1924/07/15, 78 y.o.   MRN: [id number] Gina Reilly [id number] 1923/11/17 03/18/2012      Progress Note-Follow Up  Subjective  Chief Complaint  Chief Complaint  Patient presents with  . Follow-up    on BP- patient stopped taking Benicar    HPI  Patient is a 78 year old Caucasian female who is in today for evaluation of her blood pressure. She ran out of her metoprolol week and half ago and stopped her Benicar couple weeks ago. She said that are made her feel more tired and more short of breath. She felt as if she underwent feet and was unable to even walk across the room. After stopping her she actually felt better in about a week and half ago and have her metoprolol. She went to [place] over the weekend with friends and was unable to participate in a lot of activities due to her fatigue and discomfort. She denies fevers, chills, chest pain, palpitations. She denies cough or congestion. She denies any GI or GU complaints. No dysuria no constipation no other concerns are noted. She does note trouble with her legs is worse at night. She lies down a burning tingling she has to get up due to the discomfort. Her feet often feel cold and did have a burning sensation as well. Having increased pedal edema lately as well. She technologist she has restless sleep and often wakes up fatigued but denies headaches. Denies any recent trouble with mental status changes or trouble such as paying her bills at home.  Past Medical History  Diagnosis Date  . Anemia   . GERD (gastroesophageal reflux disease)   . Thyroid disease     hypothroidism  . Arthritis     osteo  . Hyperlipidemia   . Vitamin d deficiency   . Chicken pox as a child  . Mumps as a child  . Measles as a child  . Hypertension   . Skin cancer     left temple  . Obese   . Ovarian cyst   . Pelvic mass in female   . Bronchitis 12/25/2011  . Contusion of leg, left 01/16/2012  . Peripheral neuropathy  02/26/2012  . Hyperglycemia 01/10/2010    Qualifier: Diagnosis of  By: Alphonzo Severance MD, Loni Dolly     Past Surgical History  Procedure Date  . Cataract extraction     Dr. Burgess Estelle  . Total hip arthroplasty 2003    left  . Abdominal hysterectomy   . Skin biopsy     left temple. cancerous    Family History  Problem Relation Age of Onset  . Stroke Mother   . Cancer Father     prostate  . Hypertension Brother   . Cancer Maternal Grandmother   . Hypertension Brother   . Heart disease Brother   . Obesity Brother   . Hypertension Brother   . Hyperlipidemia Brother   . Heart disease Brother   . Kidney disease Brother   . Cancer Daughter     kidney- spine and blood stream  . Heart disease Son     valvular heart disease    History   Social History  . Marital Status: Widowed    Spouse Name: N/A    Number of Children: N/A  . Years of Education: N/A   Occupational History  . Not on file.   Social History Main Topics  . Smoking status: Former Games developer  . Smokeless tobacco:  Never Used  . Alcohol Use: Yes  . Drug Use: No  . Sexually Active: Not on file   Other Topics Concern  . Not on file   Social History Narrative  . No narrative on file    Current Outpatient Prescriptions on File Prior to Visit  Medication Sig Dispense Refill  . amiodarone (PACERONE) 200 MG tablet Take 1 tablet (200 mg total) by mouth 2 (two) times daily.  60 tablet  4  . Multiple Vitamin (MULTIVITAMIN) tablet Take 1 tablet by mouth daily.        Marland Kitchen PRADAXA 150 MG CAPS TAKE ONE CAPSULE TWICE A DAY  60 capsule  5  . DISCONTD: metoprolol tartrate (LOPRESSOR) 25 MG tablet Take 0.5 tablets (12.5 mg total) by mouth 2 (two) times daily.        Allergies  Allergen Reactions  . Aspercreme (Trolamine Salicylate)     Review of Systems  Review of Systems  Constitutional: Positive for malaise/fatigue. Negative for fever.  HENT: Negative for congestion.   Eyes: Negative for discharge.  Respiratory:  Positive for shortness of breath.   Cardiovascular: Positive for leg swelling. Negative for chest pain and palpitations.  Gastrointestinal: Negative for nausea, abdominal pain and diarrhea.  Genitourinary: Negative for dysuria.  Musculoskeletal: Negative for falls.  Skin: Negative for rash.  Neurological: Positive for tingling and sensory change. Negative for loss of consciousness and headaches.  Endo/Heme/Allergies: Negative for polydipsia.  Psychiatric/Behavioral: Negative for depression and suicidal ideas. The patient is not nervous/anxious and does not have insomnia.     Objective  BP 166/86  Pulse 60  Temp(Src) 97.6 F (36.4 C) (Temporal)  Ht 5\' 7"  (1.702 m)  Wt 227 lb 6.4 oz (103.148 kg)  BMI 35.62 kg/m2  SpO2 93%  LMP 12/25/2011  Physical Exam  Physical Exam  Constitutional: She is oriented to person, place, and time and well-developed, well-nourished, and in no distress. No distress.  HENT:  [place]: Normocephalic and atraumatic.  Eyes: Conjunctivae are normal.  Neck: Neck supple. No thyromegaly present.  Cardiovascular: Normal rate and normal heart sounds.   No murmur heard.      bradycardia  Pulmonary/Chest: Effort normal and breath sounds normal. She has no wheezes.  Abdominal: She exhibits no distension and no mass.  Musculoskeletal: She exhibits edema.       Trace pedal edema b/l  Lymphadenopathy:    She has no cervical adenopathy.  Neurological: She is alert and oriented to person, place, and time.  Skin: Skin is warm and dry. No rash noted. She is not diaphoretic.  Psychiatric: Memory, affect and judgment normal.    Lab Results  Component Value Date   TSH 3.39 01/08/2012   Lab Results  Component Value Date   WBC 8.9 01/08/2012   HGB 12.9 01/08/2012   HCT 39.1 01/08/2012   MCV 92.9 01/08/2012   PLT 335.0 01/08/2012   Lab Results  Component Value Date   CREATININE 0.9 01/08/2012   BUN 16 01/08/2012   NA 137 01/08/2012   K 4.1 01/08/2012   CL 104  01/08/2012   CO2 24 01/08/2012   Lab Results  Component Value Date   ALT 25 01/08/2012   AST 29 01/08/2012   ALKPHOS 64 01/08/2012   BILITOT 0.4 01/08/2012   Lab Results  Component Value Date   CHOL 161 01/08/2012   Lab Results  Component Value Date   HDL 59.70 01/08/2012   Lab Results  Component Value Date   LDLCALC  86 01/08/2012   Lab Results  Component Value Date   TRIG 77.0 01/08/2012   Lab Results  Component Value Date   CHOLHDL 3 01/08/2012     Assessment & Plan  Hypertension Improved on repeat today was very anxious upon arrival today. She has been out of her Metoprolol for the past 1 1/2 weeks and she stopped th Benicar secondary to increased fatigue, SOB and actually feels her symptoms improved when she stopped it. We will restart low dose Metoprolol 25 mg 1/2 tab po bid and recheck bp in 2 weeks or sooner as needed. Will also check labs today including repeat thyroid labs  HYPOTHYROIDISM Recent T4 mildly elevated. Will recheck today  URINARY FREQUENCY Check a UA  Hyperglycemia Will check a hgba1c and repeat a renal panel today  Peripheral neuropathy C/o tingling sometimes burning in feet worse at night when she lies down. Symptoms c/w PN vs RLS will check a sleep study and consider medical treatment after that  FATIGUE Multifactorial. Patient acknowledges snoring, increased fatigue, sob, restless sleep. Will check a sleep study

## 2012-03-18 NOTE — Assessment & Plan Note (Signed)
Check a UA

## 2012-03-18 NOTE — Assessment & Plan Note (Signed)
Recent T4 mildly elevated. Will recheck today

## 2012-03-18 NOTE — Assessment & Plan Note (Signed)
Improved on repeat today was very anxious upon arrival today. She has been out of her Metoprolol for the past 1 1/2 weeks and she stopped th Benicar secondary to increased fatigue, SOB and actually feels her symptoms improved when she stopped it. We will restart low dose Metoprolol 25 mg 1/2 tab po bid and recheck bp in 2 weeks or sooner as needed. Will also check labs today including repeat thyroid labs

## 2012-03-18 NOTE — Assessment & Plan Note (Signed)
Will check a hgba1c and repeat a renal panel today

## 2012-03-18 NOTE — Patient Instructions (Signed)

## 2012-03-18 NOTE — Assessment & Plan Note (Signed)
C/o tingling sometimes burning in feet worse at night when she lies down. Symptoms c/w PN vs RLS will check a sleep study and consider medical treatment after that

## 2012-03-18 NOTE — Assessment & Plan Note (Signed)
Multifactorial. Patient acknowledges snoring, increased fatigue, sob, restless sleep. Will check a sleep study

## 2012-03-19 ENCOUNTER — Other Ambulatory Visit: Payer: Self-pay | Admitting: Family Medicine

## 2012-03-19 DIAGNOSIS — E0590 Thyrotoxicosis, unspecified without thyrotoxic crisis or storm: Secondary | ICD-10-CM

## 2012-03-25 ENCOUNTER — Encounter: Payer: Self-pay | Admitting: Family Medicine

## 2012-03-31 ENCOUNTER — Ambulatory Visit (INDEPENDENT_AMBULATORY_CARE_PROVIDER_SITE_OTHER): Payer: Medicare Other | Admitting: Family Medicine

## 2012-03-31 ENCOUNTER — Encounter: Payer: Self-pay | Admitting: Family Medicine

## 2012-03-31 VITALS — BP 206/82 | HR 53 | Temp 98.2°F | Ht 67.0 in | Wt 230.1 lb

## 2012-03-31 DIAGNOSIS — R0602 Shortness of breath: Secondary | ICD-10-CM | POA: Diagnosis not present

## 2012-03-31 DIAGNOSIS — I10 Essential (primary) hypertension: Secondary | ICD-10-CM

## 2012-03-31 DIAGNOSIS — R001 Bradycardia, unspecified: Secondary | ICD-10-CM

## 2012-03-31 DIAGNOSIS — R1900 Intra-abdominal and pelvic swelling, mass and lump, unspecified site: Secondary | ICD-10-CM | POA: Diagnosis not present

## 2012-03-31 DIAGNOSIS — I498 Other specified cardiac arrhythmias: Secondary | ICD-10-CM

## 2012-03-31 LAB — RENAL FUNCTION PANEL
Albumin: 3.2 g/dL — ABNORMAL LOW (ref 3.5–5.2)
Chloride: 105 mEq/L (ref 96–112)
GFR: 59.24 mL/min — ABNORMAL LOW (ref >60.00)
Glucose, Bld: 77 mg/dL (ref 70–99)
Phosphorus: 2.9 mg/dL (ref 2.3–4.6)
Potassium: 4.9 mEq/L (ref 3.5–5.1)
Sodium: 140 mEq/L (ref 135–145)

## 2012-03-31 MED ORDER — LISINOPRIL 10 MG PO TABS: 10.0000 mg | ORAL_TABLET | Freq: Two times a day (BID) | ORAL | Status: DC

## 2012-03-31 NOTE — Patient Instructions (Signed)

## 2012-03-31 NOTE — Assessment & Plan Note (Signed)
Recurrent bradycardia that is symptomatic with fatigue and sob, is made worse with beta Blockade and is hampering our ability to get control of her bp. Will have her seen by cardiology again to see if we can consider adjusting her Amiodarone or other course of action. For now I will stop her Metoprolol

## 2012-03-31 NOTE — Progress Notes (Signed)
Patient ID: Gina Reilly, female   DOB: Feb 11, 1924, 78 y.o.   MRN: [id number] Dawson Hollman Mormile [id number] 1924/07/10 03/31/2012      Progress Note-Follow Up  Subjective  Chief Complaint  Chief Complaint  Patient presents with  . Follow-up    2 week    HPI  Patient is an 78 year old Caucasian female who is in today for followup on her blood pressure. She started back to metoprolol 25 mg half tab twice a day since her last visit and unfortunately she is struggling with worsening shortness or breath fatigue and she did that. She has trouble walking across her house. She reports she did have a fall in her porch earlier this week but did not feel was because of syncope but rather just tripped on a step. She fell in her knees and her left hand. Has some bruising but no change in range of motion and no significant pain. Over the last year for her blood pressure she's also tried Benicar which made her feel fatigued and tired as well and chlorthalidone which she claims caused cough. She has an appointment coming up this next week for sleep study. She denies headache, chest pain, palpitations. No GI or GU complaints  Past Medical History  Diagnosis Date  . Anemia   . GERD (gastroesophageal reflux disease)   . Thyroid disease     hypothroidism  . Arthritis     osteo  . Hyperlipidemia   . Vitamin d deficiency   . Chicken pox as a child  . Mumps as a child  . Measles as a child  . Hypertension   . Skin cancer     left temple  . Obese   . Ovarian cyst   . Pelvic mass in female   . Bronchitis 12/25/2011  . Contusion of leg, left 01/16/2012  . Peripheral neuropathy 02/26/2012  . Hyperglycemia 01/10/2010    Qualifier: Diagnosis of  By: Alphonzo Severance MD, Loni Dolly     Past Surgical History  Procedure Date  . Cataract extraction     Dr. Burgess Estelle  . Total hip arthroplasty 2003    left  . Abdominal hysterectomy   . Skin biopsy     left temple. cancerous    Family History  Problem Relation Age of  Onset  . Stroke Mother   . Cancer Father     prostate  . Hypertension Brother   . Cancer Maternal Grandmother   . Hypertension Brother   . Heart disease Brother   . Obesity Brother   . Hypertension Brother   . Hyperlipidemia Brother   . Heart disease Brother   . Kidney disease Brother   . Cancer Daughter     kidney- spine and blood stream  . Heart disease Son     valvular heart disease    History   Social History  . Marital Status: Widowed    Spouse Name: N/A    Number of Children: N/A  . Years of Education: N/A   Occupational History  . Not on file.   Social History Main Topics  . Smoking status: Former Games developer  . Smokeless tobacco: Never Used  . Alcohol Use: Yes  . Drug Use: No  . Sexually Active: Not on file   Other Topics Concern  . Not on file   Social History Narrative  . No narrative on file    Current Outpatient Prescriptions on File Prior to Visit  Medication Sig Dispense Refill  . amiodarone (  PACERONE) 200 MG tablet Take 1 tablet (200 mg total) by mouth 2 (two) times daily.  60 tablet  4  . Multiple Vitamin (MULTIVITAMIN) tablet Take 1 tablet by mouth daily.        Marland Kitchen PRADAXA 150 MG CAPS TAKE ONE CAPSULE TWICE A DAY  60 capsule  5  . lisinopril (PRINIVIL,ZESTRIL) 10 MG tablet Take 1 tablet (10 mg total) by mouth 2 (two) times daily.  60 tablet  3    Allergies  Allergen Reactions  . Aspercreme (Trolamine Salicylate)     Review of Systems  Review of Systems  Constitutional: Positive for malaise/fatigue. Negative for fever.  HENT: Negative for congestion.   Eyes: Negative for discharge.  Respiratory: Negative for shortness of breath.   Cardiovascular: Negative for chest pain, palpitations and leg swelling.  Gastrointestinal: Negative for nausea, abdominal pain and diarrhea.  Genitourinary: Negative for dysuria.  Musculoskeletal: Positive for falls.       Fell on some stairs on her porch and bruised her left wrist and both knees. No mobility  impairment as a result. No syncope associated.   Skin: Negative for rash.  Neurological: Negative for loss of consciousness and headaches.  Endo/Heme/Allergies: Negative for polydipsia.  Psychiatric/Behavioral: Negative for depression and suicidal ideas. The patient is not nervous/anxious and does not have insomnia.     Objective  BP 206/82  Pulse 53  Temp(Src) 98.2 F (36.8 C) (Temporal)  Ht 5\' 7"  (1.702 m)  Wt 230 lb 1.9 oz (104.382 kg)  BMI 36.04 kg/m2  SpO2 90%  LMP 12/25/2011  Physical Exam  Physical Exam  Constitutional: She is oriented to person, place, and time and well-developed, well-nourished, and in no distress. No distress.  HENT:  [place]: Normocephalic and atraumatic.  Eyes: Conjunctivae are normal.  Neck: Neck supple. No thyromegaly present.  Cardiovascular: Normal rate, regular rhythm and normal heart sounds.   No murmur heard. Pulmonary/Chest: Effort normal and breath sounds normal. She has no wheezes.  Abdominal: She exhibits no distension and no mass.  Musculoskeletal: She exhibits no edema.  Lymphadenopathy:    She has no cervical adenopathy.  Neurological: She is alert and oriented to person, place, and time. She has normal reflexes. No cranial nerve deficit. Coordination normal.  Skin: Skin is warm and dry. No rash noted. She is not diaphoretic.       Ecchymosis left wrist and b/l knees. No swelling or point tenderness. No decreased ROM  Psychiatric: Memory, affect and judgment normal.    Lab Results  Component Value Date   TSH 2.46 03/18/2012   Lab Results  Component Value Date   WBC 8.9 01/08/2012   HGB 12.9 01/08/2012   HCT 39.1 01/08/2012   MCV 92.9 01/08/2012   PLT 335.0 01/08/2012   Lab Results  Component Value Date   CREATININE 0.9 03/18/2012   BUN 15 03/18/2012   NA 139 03/18/2012   K 5.2* 03/18/2012   CL 104 03/18/2012   CO2 23 03/18/2012   Lab Results  Component Value Date   ALT 25 01/08/2012   AST 29 01/08/2012   ALKPHOS 64 01/08/2012     BILITOT 0.4 01/08/2012   Lab Results  Component Value Date   CHOL 161 01/08/2012   Lab Results  Component Value Date   HDL 59.70 01/08/2012   Lab Results  Component Value Date   LDLCALC 86 01/08/2012   Lab Results  Component Value Date   TRIG 77.0 01/08/2012   Lab Results  Component Value Date   CHOLHDL 3 01/08/2012     Assessment & Plan  BRADYCARDIA Recurrent bradycardia that is symptomatic with fatigue and sob, is made worse with beta Blockade and is hampering our ability to get control of her bp. Will have her seen by cardiology again to see if we can consider adjusting her Amiodarone or other course of action. For now I will stop her Metoprolol  Hypertension Will d/c Metoprolol due to symptomatic bradycardia and start Lisinopril 10 mg bid, reassess in 2 weeks  Pelvic mass in female Patient describes an ovarian cyst which has been followed by [facility] gyn oncology. They have now released her due to lesion is stable and too big to remove laparascopically. They believe the surgery would be too risky for her at this time. She is asymptomatic

## 2012-03-31 NOTE — Assessment & Plan Note (Signed)
Patient describes an ovarian cyst which has been followed by [facility] gyn oncology. They have now released her due to lesion is stable and too big to remove laparascopically. They believe the surgery would be too risky for her at this time. She is asymptomatic

## 2012-03-31 NOTE — Assessment & Plan Note (Signed)
Will d/c Metoprolol due to symptomatic bradycardia and start Lisinopril 10 mg bid, reassess in 2 weeks

## 2012-04-06 ENCOUNTER — Ambulatory Visit (HOSPITAL_BASED_OUTPATIENT_CLINIC_OR_DEPARTMENT_OTHER): Payer: Medicare Other | Attending: Family Medicine

## 2012-04-06 DIAGNOSIS — G4733 Obstructive sleep apnea (adult) (pediatric): Secondary | ICD-10-CM | POA: Insufficient documentation

## 2012-04-08 ENCOUNTER — Ambulatory Visit: Payer: Medicare Other | Admitting: Family Medicine

## 2012-04-13 ENCOUNTER — Ambulatory Visit (INDEPENDENT_AMBULATORY_CARE_PROVIDER_SITE_OTHER): Payer: Medicare Other | Admitting: Physician Assistant

## 2012-04-13 ENCOUNTER — Encounter: Payer: Self-pay | Admitting: Physician Assistant

## 2012-04-13 VITALS — BP 150/86 | HR 54 | Ht 67.0 in | Wt 230.0 lb

## 2012-04-13 DIAGNOSIS — I4891 Unspecified atrial fibrillation: Secondary | ICD-10-CM | POA: Diagnosis not present

## 2012-04-13 DIAGNOSIS — R5383 Other fatigue: Secondary | ICD-10-CM

## 2012-04-13 DIAGNOSIS — I10 Essential (primary) hypertension: Secondary | ICD-10-CM

## 2012-04-13 DIAGNOSIS — R0602 Shortness of breath: Secondary | ICD-10-CM

## 2012-04-13 DIAGNOSIS — R001 Bradycardia, unspecified: Secondary | ICD-10-CM

## 2012-04-13 DIAGNOSIS — R5381 Other malaise: Secondary | ICD-10-CM | POA: Diagnosis not present

## 2012-04-13 DIAGNOSIS — I498 Other specified cardiac arrhythmias: Secondary | ICD-10-CM

## 2012-04-13 LAB — BASIC METABOLIC PANEL
Calcium: 8.7 mg/dL (ref 8.4–10.5)
GFR: 51.64 mL/min — ABNORMAL LOW (ref >60.00)
Glucose, Bld: 95 mg/dL (ref 70–99)
Potassium: 4.6 mEq/L (ref 3.5–5.1)
Sodium: 137 mEq/L (ref 135–145)

## 2012-04-13 LAB — CBC WITH DIFFERENTIAL/PLATELET
Basophils Absolute: 0.1 10*3/uL (ref 0.0–0.1)
Eosinophils Absolute: 0.1 10*3/uL (ref 0.0–0.7)
Hemoglobin: 12.4 g/dL (ref 12.0–15.0)
Lymphocytes Relative: 12.5 % (ref 12.0–46.0)
MCHC: 32.9 g/dL (ref 30.0–36.0)
Monocytes Relative: 8 % (ref 3.0–12.0)
Neutrophils Relative %: 77.4 % — ABNORMAL HIGH (ref 43.0–77.0)
RBC: 4.05 Mil/uL (ref 3.87–5.11)
RDW: 16.5 % — ABNORMAL HIGH (ref 11.5–14.6)

## 2012-04-13 LAB — BRAIN NATRIURETIC PEPTIDE: Pro B Natriuretic peptide (BNP): 176 pg/mL — ABNORMAL HIGH (ref 0.0–100.0)

## 2012-04-13 MED ORDER — AMIODARONE HCL 200 MG PO TABS: 200.0000 mg | ORAL_TABLET | Freq: Every day | ORAL | Status: DC

## 2012-04-13 NOTE — Progress Notes (Signed)
[address] [place], [place]  [address] Phone: [phone] Fax:  [phone]  Date:  04/13/2012   Name:  Gina Reilly   DOB:  03/08/1924   MRN:  [id number]  PCP:  Danise Edge, MD  Primary Cardiologist:  Dr. Valera Castle  Primary Electrophysiologist:  None    History of Present Illness: Gina Reilly is a 78 y.o. female who returns for evaluation of BP and fatigue.  She has a history of paroxysmal atrial fibrillation.  She was initially seen in 2011 when she presented with atrial fibrillation.  She underwent TEE guided cardioversion.  She has been maintained on amiodarone and Pradaxa. She has had problems with bradycardia with adjustments in her medications in the past and eventual d/c of metoprolol. Transesophageal Echocardiogram 07/2010: EF 60-65% mild MR.  Last seen by Dr. Daleen Squibb 10/2011.  Plan was for 6 month followup.  She really has had problems with fatigue and dyspnea with exertion since her hospitalization in 2011.  She attributes it to the Pradaxa.  She feels as though she has "lead in her feet."  She has recently been tried on multiple medications for her blood pressure with her PCP.  She was apparently placed back on metoprolol but this had to be discontinued secondary to low heart rate.  She had worsening fatigue with Benicar.  Interestingly, she had a cough occur with chlorthalidone.  She has recently been placed on lisinopril.  Her blood pressure seems to be better and she is tolerating it.  She denies chest pain or syncope.  She describes fairly stable dyspnea with exertion.  She notes class IIb-III symptoms.  She denies orthopnea, PND.  She has recently noted ankle edema.  Wt Readings from Last 3 Encounters:  04/13/12 230 lb (104.327 kg)  03/31/12 230 lb 1.9 oz (104.382 kg)  03/18/12 227 lb 6.4 oz (103.148 kg)     Potassium  Date/Time Value Range Status  03/31/2012 10:20 AM 4.9  3.5 - 5.1 mEq/L Final     Creatinine, Ser  Date/Time Value Range  Status  03/31/2012 10:20 AM 1.0  0.4 - 1.2 mg/dL Final     ALT  Date/Time Value Range Status  01/08/2012  9:25 AM 25  0 - 35 U/L Final     TSH  Date/Time Value Range Status  03/18/2012 11:55 AM 2.46  0.35 - 5.50 uIU/mL Final     Hemoglobin  Date/Time Value Range Status  01/08/2012  9:25 AM 12.9  12.0 - 15.0 g/dL Final    Past Medical History  Diagnosis Date  . Anemia   . GERD (gastroesophageal reflux disease)   . Thyroid disease     hypothroidism  . Arthritis     osteo  . Hyperlipidemia   . Vitamin d deficiency   . Chicken pox as a child  . Mumps as a child  . Measles as a child  . Hypertension   . Skin cancer     left temple  . Obese   . Ovarian cyst   . Pelvic mass in female   . Bronchitis 12/25/2011  . Contusion of leg, left 01/16/2012  . Peripheral neuropathy 02/26/2012  . Hyperglycemia 01/10/2010    Qualifier: Diagnosis of  By: Alphonzo Severance MD, Loni Dolly   . Atrial fibrillation     Status post TEE guided DCCV 07/2010:  Transesophageal Echocardiogram 07/2010: EF 60-65% mild MR.;  Amiodarone;  Pradaxa    Current Outpatient Prescriptions  Medication Sig Dispense  Refill  . acetaminophen (TYLENOL 8 HOUR) 650 MG CR tablet Take 650 mg by mouth every 8 (eight) hours as needed.      Marland Kitchen amiodarone (PACERONE) 200 MG tablet Take 1 tablet (200 mg total) by mouth 2 (two) times daily.  60 tablet  4  . lisinopril (PRINIVIL,ZESTRIL) 10 MG tablet Take 1 tablet (10 mg total) by mouth 2 (two) times daily.  60 tablet  3  . Multiple Vitamin (MULTIVITAMIN) tablet Take 1 tablet by mouth daily.        Marland Kitchen PRADAXA 150 MG CAPS TAKE ONE CAPSULE TWICE A DAY  60 capsule  5  . ranitidine (ZANTAC) 150 MG tablet Take 150 mg by mouth at bedtime.        Allergies: Allergies  Allergen Reactions  . Aspercreme (Trolamine Salicylate)     History  Substance Use Topics  . Smoking status: Former Games developer  . Smokeless tobacco: Never Used  . Alcohol Use: Yes     ROS:  Please see the history of present  illness.   She has a mild nonproductive cough.  She has recently had some diarrhea.  All other systems reviewed and negative.   PHYSICAL EXAM: VS:  BP 150/86  Pulse 54  Ht 5\' 7"  (1.702 m)  Wt 230 lb (104.327 kg)  BMI 36.02 kg/m2  LMP 12/25/2011 Well nourished, well developed, in no acute distress HEENT: normal Neck: no JVD Cardiac:  normal S1, S2; RRR; no murmur Lungs:  clear to auscultation bilaterally, no wheezing, rhonchi or rales Abd: soft, nontender, no hepatomegaly Ext: 1+ bilateral LE edema Skin: warm and dry Neuro:  CNs 2-12 intact, no focal abnormalities noted Psych: normal affect   EKG:  Sinus bradycardia, rate 54, PACs with compensatory pause, left axis deviation, poor R-wave progression, nonspecific ST-T wave changes   ASSESSMENT AND PLAN:  1.  Fatigue This has been a persistent symptom since she was hospitalized with atrial fibrillation in 2011. She thinks that it may be getting somewhat worse. She has had difficulty with bradycardia requiring discontinuation of beta blocker therapy. She is still on amiodarone 200 mg twice a day.  This is likely impacting her heart rate as well. I will decrease her amiodarone to 200 mg once a day. In light of her continued amiodarone use, I will also check a TSH.  Of note, she is being referred to endocrinology soon for an elevated T4. As she remains on Pradaxa, I will also check a CBC. She will be seen back in followup in the next 4 weeks.  If her symptoms do not improve with improved heart rate, consider stress testing. Also, there has not been a significant change in her dyspnea.  However, we could consider arranging a chest x-ray, depending upon her symptoms at follow up.  2.  Dyspnea Consider CXR in follow up as noted above. She does have lower extremity edema.  Otherwise, she does not appear to be particularly volume overloaded. I will check a basic metabolic panel and BNP today. If her BNP is significantly elevated, I will  start her on Lasix. In the event that her BNP is significantly elevated, I would suggest that we set her up with a relook echocardiogram.  3.  Bradycardia Adjust medications as noted.  4.  Atrial Fibrillation Maintaining NSR. Adjust medications as noted. Remain on Pradaxa.   5.  Hypertension Better controlled. Will continue to monitor for now.   Signed, Tereso Newcomer, PA-C  10:35 AM 04/13/2012

## 2012-04-13 NOTE — Patient Instructions (Addendum)
Your physician has recommended you make the following change in your medication: DECREASE Amiodarone to 200mg  once a day  Your physician recommends that you have lab work today: BMP, CBC, TSH and BNP  Your physician recommends that you schedule a follow-up appointment in: 4 WEEKS with Dr Daleen Squibb or Tereso Newcomer PA-C

## 2012-04-14 ENCOUNTER — Encounter: Payer: Self-pay | Admitting: Family Medicine

## 2012-04-14 ENCOUNTER — Ambulatory Visit (INDEPENDENT_AMBULATORY_CARE_PROVIDER_SITE_OTHER): Payer: Medicare Other | Admitting: Family Medicine

## 2012-04-14 ENCOUNTER — Other Ambulatory Visit: Payer: Self-pay

## 2012-04-14 VITALS — BP 180/84 | HR 54 | Temp 97.2°F | Ht 67.0 in | Wt 228.0 lb

## 2012-04-14 DIAGNOSIS — R0602 Shortness of breath: Secondary | ICD-10-CM

## 2012-04-14 DIAGNOSIS — R0902 Hypoxemia: Secondary | ICD-10-CM | POA: Diagnosis not present

## 2012-04-14 DIAGNOSIS — I4891 Unspecified atrial fibrillation: Secondary | ICD-10-CM

## 2012-04-14 DIAGNOSIS — R5383 Other fatigue: Secondary | ICD-10-CM | POA: Diagnosis not present

## 2012-04-14 DIAGNOSIS — R5381 Other malaise: Secondary | ICD-10-CM

## 2012-04-14 DIAGNOSIS — I498 Other specified cardiac arrhythmias: Secondary | ICD-10-CM

## 2012-04-14 DIAGNOSIS — I10 Essential (primary) hypertension: Secondary | ICD-10-CM

## 2012-04-14 HISTORY — DX: Hypoxemia: R09.02

## 2012-04-14 MED ORDER — FUROSEMIDE 20 MG PO TABS: 20.0000 mg | ORAL_TABLET | Freq: Every day | ORAL | Status: DC

## 2012-04-14 MED ORDER — LOSARTAN POTASSIUM 50 MG PO TABS: 50.0000 mg | ORAL_TABLET | Freq: Every day | ORAL | Status: DC

## 2012-04-14 NOTE — Assessment & Plan Note (Signed)
Amiodarone dropped to once daily just yesterday will continue to monitor

## 2012-04-14 NOTE — Progress Notes (Signed)
Patient ID: Gina Reilly, female   DOB: 03/30/1924, 78 y.o.   MRN: [id number] Farron Lafond Underdown [id number] 12-Oct-1924 04/14/2012      Progress Note-Follow Up  Subjective  Chief Complaint  Chief Complaint  Patient presents with  . Follow-up    2 week    HPI  Patient is an 56 we'll Caucasian female who is in today for followup on her hypertension and multiple medical problems. She's accompanied by her daughter. Date knowledge she struggling with increasing fatigue and shortness of breath. She denies chest pain or palpitations. She was seen by cardiology yesterday and it did drop her amiodarone dosing down to once a day from twice a just occurred in the last 24 hours. She did go for the initial half of a sleep study last week but has not her results thus far. She does acknowledge snoring and persistent fatigue. Denies fevers, chills. Restart lisinopril at her last visit and the cough she was experiencing has worsened. Nonproductive. No headache or [place] congestion. No GI or GU complaints today. Her most significant complaint is fatigue as well as worsening shortness of breath.  Past Medical History  Diagnosis Date  . Anemia   . GERD (gastroesophageal reflux disease)   . Thyroid disease     hypothroidism  . Arthritis     osteo  . Hyperlipidemia   . Vitamin d deficiency   . Chicken pox as a child  . Mumps as a child  . Measles as a child  . Hypertension   . Skin cancer     left temple  . Obese   . Ovarian cyst   . Pelvic mass in female   . Bronchitis 12/25/2011  . Contusion of leg, left 01/16/2012  . Peripheral neuropathy 02/26/2012  . Hyperglycemia 01/10/2010    Qualifier: Diagnosis of  By: Alphonzo Severance MD, Loni Dolly   . Atrial fibrillation     Status post TEE guided DCCV 07/2010:  Transesophageal Echocardiogram 07/2010: EF 60-65% mild MR.;  Amiodarone;  Pradaxa  . Hypoxia 04/14/2012    Past Surgical History  Procedure Date  . Cataract extraction     Dr. Burgess Estelle  . Total hip  arthroplasty 2003    left  . Abdominal hysterectomy   . Skin biopsy     left temple. cancerous    Family History  Problem Relation Age of Onset  . Stroke Mother   . Cancer Father     prostate  . Hypertension Brother   . Cancer Maternal Grandmother   . Hypertension Brother   . Heart disease Brother   . Obesity Brother   . Hypertension Brother   . Hyperlipidemia Brother   . Heart disease Brother   . Kidney disease Brother   . Cancer Daughter     kidney- spine and blood stream  . Heart disease Son     valvular heart disease    History   Social History  . Marital Status: Widowed    Spouse Name: N/A    Number of Children: N/A  . Years of Education: N/A   Occupational History  . Not on file.   Social History Main Topics  . Smoking status: Former Games developer  . Smokeless tobacco: Never Used  . Alcohol Use: Yes  . Drug Use: No  . Sexually Active: Not on file   Other Topics Concern  . Not on file   Social History Narrative  . No narrative on file    Current Outpatient Prescriptions  on File Prior to Visit  Medication Sig Dispense Refill  . acetaminophen (TYLENOL 8 HOUR) 650 MG CR tablet Take 650 mg by mouth every 6 (six) hours.       Marland Kitchen amiodarone (PACERONE) 200 MG tablet Take 1 tablet (200 mg total) by mouth daily.  30 tablet  6  . Multiple Vitamin (MULTIVITAMIN) tablet Take 1 tablet by mouth daily.        Marland Kitchen PRADAXA 150 MG CAPS TAKE ONE CAPSULE TWICE A DAY  60 capsule  5  . furosemide (LASIX) 20 MG tablet Take 1 tablet (20 mg total) by mouth daily.  30 tablet  6  . losartan (COZAAR) 50 MG tablet Take 1 tablet (50 mg total) by mouth daily.  30 tablet  3    Allergies  Allergen Reactions  . Aspercreme (Trolamine Salicylate)     Review of Systems  Review of Systems  Constitutional: Positive for malaise/fatigue. Negative for fever.  HENT: Negative for congestion.   Eyes: Negative for discharge.  Respiratory: Positive for cough and shortness of breath.     Cardiovascular: Negative for chest pain, palpitations and leg swelling.  Gastrointestinal: Negative for nausea, abdominal pain and diarrhea.  Genitourinary: Negative for dysuria.  Musculoskeletal: Negative for falls.  Skin: Negative for rash.  Neurological: Negative for loss of consciousness and headaches.  Endo/Heme/Allergies: Negative for polydipsia.  Psychiatric/Behavioral: Negative for depression and suicidal ideas. The patient is not nervous/anxious and does not have insomnia.     Objective  BP 180/84  Pulse 54  Temp 97.2 F (36.2 C) (Temporal)  Ht 5\' 7"  (1.702 m)  Wt 228 lb (103.42 kg)  BMI 35.71 kg/m2  SpO2 88%  LMP 12/25/2011  Physical Exam  Physical Exam  Constitutional: She is oriented to person, place, and time and well-developed, well-nourished, and in no distress. No distress.  HENT:  [place]: Normocephalic and atraumatic.  Eyes: Conjunctivae are normal.  Neck: Neck supple. No thyromegaly present.  Cardiovascular: Normal rate, regular rhythm and normal heart sounds.   No murmur heard. Pulmonary/Chest: Effort normal and breath sounds normal. She has no wheezes.  Abdominal: She exhibits no distension and no mass.  Musculoskeletal: She exhibits no edema.  Lymphadenopathy:    She has no cervical adenopathy.  Neurological: She is alert and oriented to person, place, and time.  Skin: Skin is warm and dry. No rash noted. She is not diaphoretic.  Psychiatric: Memory, affect and judgment normal.    Lab Results  Component Value Date   TSH 2.80 04/13/2012   Lab Results  Component Value Date   WBC 8.5 04/13/2012   HGB 12.4 04/13/2012   HCT 37.7 04/13/2012   MCV 93.0 04/13/2012   PLT 366.0 04/13/2012   Lab Results  Component Value Date   CREATININE 1.1 04/13/2012   BUN 18 04/13/2012   NA 137 04/13/2012   K 4.6 04/13/2012   CL 106 04/13/2012   CO2 19 04/13/2012   Lab Results  Component Value Date   ALT 25 01/08/2012   AST 29 01/08/2012   ALKPHOS 64 01/08/2012    BILITOT 0.4 01/08/2012   Lab Results  Component Value Date   CHOL 161 01/08/2012   Lab Results  Component Value Date   HDL 59.70 01/08/2012   Lab Results  Component Value Date   LDLCALC 86 01/08/2012   Lab Results  Component Value Date   TRIG 77.0 01/08/2012   Lab Results  Component Value Date   CHOLHDL 3 01/08/2012  Assessment & Plan  BRADYCARDIA Amiodarone dropped to once daily just yesterday will continue to monitor  Hypertension BP still not fully controlled, cough has worsened with Lisinopril back on board, will switch her to Losartan 50 mg daily and reassess in 2 weeks.  FATIGUE Multifactorial, will continue to work on BP control, await sleep study results. Will get her start on oxygen to help   Hypoxia Dropped to 88% on RA with minimal ambulation, will send out home health to set her up with O2 at 2 L as needed and she will likely need night time O2 as well because the preliminary report on her recent sleep study confirmed an oxygen low of 63% while she is sleeping

## 2012-04-14 NOTE — Assessment & Plan Note (Signed)
Multifactorial, will continue to work on BP control, await sleep study results. Will get her start on oxygen to help

## 2012-04-14 NOTE — Assessment & Plan Note (Signed)
Dropped to 88% on RA with minimal ambulation, will send out home health to set her up with O2 at 2 L as needed and she will likely need night time O2 as well because the preliminary report on her recent sleep study confirmed an oxygen low of 63% while she is sleeping

## 2012-04-14 NOTE — Patient Instructions (Addendum)

## 2012-04-14 NOTE — Assessment & Plan Note (Addendum)
BP still not fully controlled, cough has worsened with Lisinopril back on board, will switch her to Losartan 50 mg daily and reassess in 2 weeks.

## 2012-04-19 DIAGNOSIS — G4733 Obstructive sleep apnea (adult) (pediatric): Secondary | ICD-10-CM | POA: Diagnosis not present

## 2012-04-21 ENCOUNTER — Ambulatory Visit (HOSPITAL_COMMUNITY): Payer: Medicare Other | Attending: Internal Medicine | Admitting: Radiology

## 2012-04-21 ENCOUNTER — Other Ambulatory Visit (INDEPENDENT_AMBULATORY_CARE_PROVIDER_SITE_OTHER): Payer: Medicare Other

## 2012-04-21 ENCOUNTER — Telehealth: Payer: Self-pay | Admitting: *Deleted

## 2012-04-21 ENCOUNTER — Other Ambulatory Visit (HOSPITAL_COMMUNITY): Payer: Medicare Other

## 2012-04-21 DIAGNOSIS — I4891 Unspecified atrial fibrillation: Secondary | ICD-10-CM

## 2012-04-21 DIAGNOSIS — I079 Rheumatic tricuspid valve disease, unspecified: Secondary | ICD-10-CM | POA: Insufficient documentation

## 2012-04-21 DIAGNOSIS — M7989 Other specified soft tissue disorders: Secondary | ICD-10-CM | POA: Insufficient documentation

## 2012-04-21 DIAGNOSIS — I10 Essential (primary) hypertension: Secondary | ICD-10-CM | POA: Insufficient documentation

## 2012-04-21 DIAGNOSIS — R5381 Other malaise: Secondary | ICD-10-CM | POA: Diagnosis not present

## 2012-04-21 DIAGNOSIS — R0602 Shortness of breath: Secondary | ICD-10-CM | POA: Diagnosis not present

## 2012-04-21 DIAGNOSIS — I059 Rheumatic mitral valve disease, unspecified: Secondary | ICD-10-CM | POA: Diagnosis not present

## 2012-04-21 DIAGNOSIS — R5383 Other fatigue: Secondary | ICD-10-CM | POA: Insufficient documentation

## 2012-04-21 LAB — BASIC METABOLIC PANEL
Chloride: 100 mEq/L (ref 96–112)
GFR: 60.71 mL/min (ref >60.00)
Potassium: 4.1 mEq/L (ref 3.5–5.1)
Sodium: 137 mEq/L (ref 135–145)

## 2012-04-21 NOTE — Progress Notes (Signed)
Echocardiogram performed.  

## 2012-04-21 NOTE — Telephone Encounter (Signed)
Message copied by Tarri Fuller on Tue Apr 21, 2012  5:26 PM ------      Message from: [place], [place] T      Created: Tue Apr 21, 2012  4:13 PM       Normal LV function      She does have moderate diastolic dysfunction which can lead to increasing fluid and increasing dyspnea.      But, she does have significantly increased pulmonary pressures - which can also lead to increased shortness of breath.        -  May be from hx of smoking        -  But with hx of amiodarone use, I want her to get some more testing        -  Check sed rate        -  Arrange CXR        -  Arrange PFTs with [facility]      Tereso Newcomer, PA-C  4:12 PM 04/21/2012

## 2012-04-21 NOTE — Telephone Encounter (Signed)
pt notified of all results. sed rate 04/22/12, will have cxr and pft when she gets back from vacation 05/05/12

## 2012-04-22 ENCOUNTER — Other Ambulatory Visit (INDEPENDENT_AMBULATORY_CARE_PROVIDER_SITE_OTHER): Payer: Medicare Other

## 2012-04-22 DIAGNOSIS — R0602 Shortness of breath: Secondary | ICD-10-CM | POA: Diagnosis not present

## 2012-04-22 LAB — SEDIMENTATION RATE: Sed Rate: 39 mm/hr — ABNORMAL HIGH (ref 0–22)

## 2012-04-24 ENCOUNTER — Encounter: Payer: Self-pay | Admitting: Family Medicine

## 2012-04-24 ENCOUNTER — Ambulatory Visit (INDEPENDENT_AMBULATORY_CARE_PROVIDER_SITE_OTHER): Payer: Medicare Other | Admitting: Family Medicine

## 2012-04-24 VITALS — BP 170/92 | HR 51 | Temp 97.2°F | Ht 67.0 in | Wt 225.1 lb

## 2012-04-24 DIAGNOSIS — G4730 Sleep apnea, unspecified: Secondary | ICD-10-CM | POA: Diagnosis not present

## 2012-04-24 DIAGNOSIS — I517 Cardiomegaly: Secondary | ICD-10-CM | POA: Diagnosis not present

## 2012-04-24 DIAGNOSIS — R0902 Hypoxemia: Secondary | ICD-10-CM | POA: Diagnosis not present

## 2012-04-24 DIAGNOSIS — I498 Other specified cardiac arrhythmias: Secondary | ICD-10-CM

## 2012-04-24 DIAGNOSIS — G4733 Obstructive sleep apnea (adult) (pediatric): Secondary | ICD-10-CM | POA: Insufficient documentation

## 2012-04-24 DIAGNOSIS — I4891 Unspecified atrial fibrillation: Secondary | ICD-10-CM

## 2012-04-24 DIAGNOSIS — E0590 Thyrotoxicosis, unspecified without thyrotoxic crisis or storm: Secondary | ICD-10-CM

## 2012-04-24 DIAGNOSIS — I10 Essential (primary) hypertension: Secondary | ICD-10-CM

## 2012-04-24 HISTORY — DX: Sleep apnea, unspecified: G47.30

## 2012-04-24 NOTE — Progress Notes (Signed)
Patient ID: Gina Reilly, female   DOB: 1923-12-05, 78 y.o.   MRN: [id number] Camille Dragan January [id number] 1924/01/14 04/24/2012      Progress Note-Follow Up  Subjective  Chief Complaint  Chief Complaint  Patient presents with  . Follow-up    2 week- pt uses oxygen at night (pt thinks at 2 liters) and while sitting watching tv or reading during the day    HPI  Patient is an 3 we'll Caucasian female with in today accompanied by one of her daughters. She has been using her oxygen at night and occasionally during the day and does believe that helps her energy level somewhat. Unfortunately she continues to feel significant fatigue, weakness and shortness of breath. She was struggling with increased arthralgias and myalgias for several days until today. Today her aches and pains including her shoulder pain have greatly improved. She denies any fevers or chills. No chest pains or palpitations. His persistent fatigue and occasional sense of lightheadedness and shortness of breath as noted. No abdominal pain or GI GU complaints are noted today.  Past Medical History  Diagnosis Date  . Anemia   . GERD (gastroesophageal reflux disease)   . Thyroid disease     hypothroidism  . Arthritis     osteo  . Hyperlipidemia   . Vitamin d deficiency   . Chicken pox as a child  . Mumps as a child  . Measles as a child  . Hypertension   . Skin cancer     left temple  . Obese   . Ovarian cyst   . Pelvic mass in female   . Bronchitis 12/25/2011  . Contusion of leg, left 01/16/2012  . Peripheral neuropathy 02/26/2012  . Hyperglycemia 01/10/2010    Qualifier: Diagnosis of  By: Alphonzo Severance MD, Loni Dolly   . Atrial fibrillation     Status post TEE guided DCCV 07/2010:  Transesophageal Echocardiogram 07/2010: EF 60-65% mild MR.;  Amiodarone;  Pradaxa  . Hypoxia 04/14/2012  . Apnea, sleep 04/24/2012  . Hyperthyroidism 02/09/2009    Qualifier: Diagnosis of  By: Alphonzo Severance MD, Loni Dolly     Past Surgical History    Procedure Date  . Cataract extraction     Dr. Burgess Estelle  . Total hip arthroplasty 2003    left  . Abdominal hysterectomy   . Skin biopsy     left temple. cancerous    Family History  Problem Relation Age of Onset  . Stroke Mother   . Cancer Father     prostate  . Hypertension Brother   . Cancer Maternal Grandmother   . Hypertension Brother   . Heart disease Brother   . Obesity Brother   . Hypertension Brother   . Hyperlipidemia Brother   . Heart disease Brother   . Kidney disease Brother   . Cancer Daughter     kidney- spine and blood stream  . Heart disease Son     valvular heart disease    History   Social History  . Marital Status: Widowed    Spouse Name: N/A    Number of Children: N/A  . Years of Education: N/A   Occupational History  . Not on file.   Social History Main Topics  . Smoking status: Former Games developer  . Smokeless tobacco: Never Used  . Alcohol Use: Yes  . Drug Use: No  . Sexually Active: Not on file   Other Topics Concern  . Not on file  Social History Narrative  . No narrative on file    Current Outpatient Prescriptions on File Prior to Visit  Medication Sig Dispense Refill  . amiodarone (PACERONE) 200 MG tablet Take 1 tablet (200 mg total) by mouth daily.  30 tablet  6  . furosemide (LASIX) 20 MG tablet Take 1 tablet (20 mg total) by mouth daily.  30 tablet  6  . losartan (COZAAR) 50 MG tablet Take 1 tablet (50 mg total) by mouth daily.  30 tablet  3  . Multiple Vitamin (MULTIVITAMIN) tablet Take 1 tablet by mouth daily.        Marland Kitchen PRADAXA 150 MG CAPS TAKE ONE CAPSULE TWICE A DAY  60 capsule  5  . acetaminophen (TYLENOL 8 HOUR) 650 MG CR tablet Take 650 mg by mouth every 6 (six) hours.         Allergies  Allergen Reactions  . Aspercreme (Trolamine Salicylate)     Review of Systems  Review of Systems  Constitutional: Positive for malaise/fatigue. Negative for fever.  HENT: Negative for congestion.   Eyes: Negative for discharge.   Respiratory: Positive for shortness of breath.   Cardiovascular: Negative for chest pain, palpitations and leg swelling.  Gastrointestinal: Negative for nausea, abdominal pain and diarrhea.  Genitourinary: Negative for dysuria.  Musculoskeletal: Negative for falls.  Skin: Negative for rash.  Neurological: Positive for weakness. Negative for loss of consciousness and headaches.  Endo/Heme/Allergies: Negative for polydipsia.  Psychiatric/Behavioral: Negative for depression and suicidal ideas. The patient is not nervous/anxious and does not have insomnia.     Objective  BP 170/92  Pulse 51  Temp 97.2 F (36.2 C) (Temporal)  Ht 5\' 7"  (1.702 m)  Wt 225 lb 1.9 oz (102.114 kg)  BMI 35.26 kg/m2  SpO2 93%  LMP 12/25/2011  Physical Exam  Physical Exam  Constitutional: She is oriented to person, place, and time and well-developed, well-nourished, and in no distress. No distress.  HENT:  [place]: Normocephalic and atraumatic.  Eyes: Conjunctivae are normal.  Neck: Neck supple. No thyromegaly present.  Cardiovascular: Regular rhythm and normal heart sounds.   No murmur heard.      bradycardia  Pulmonary/Chest: Effort normal and breath sounds normal. She has no wheezes.  Abdominal: Soft. Bowel sounds are normal. She exhibits no distension and no mass.  Musculoskeletal: She exhibits no edema.  Lymphadenopathy:    She has no cervical adenopathy.  Neurological: She is alert and oriented to person, place, and time.  Skin: Skin is warm and dry. No rash noted. She is not diaphoretic.  Psychiatric: Memory, affect and judgment normal.    Lab Results  Component Value Date   TSH 2.80 04/13/2012   Lab Results  Component Value Date   WBC 8.5 04/13/2012   HGB 12.4 04/13/2012   HCT 37.7 04/13/2012   MCV 93.0 04/13/2012   PLT 366.0 04/13/2012   Lab Results  Component Value Date   CREATININE 0.9 04/21/2012   BUN 16 04/21/2012   NA 137 04/21/2012   K 4.1 04/21/2012   CL 100 04/21/2012   CO2 28  04/21/2012   Lab Results  Component Value Date   ALT 25 01/08/2012   AST 29 01/08/2012   ALKPHOS 64 01/08/2012   BILITOT 0.4 01/08/2012   Lab Results  Component Value Date   CHOL 161 01/08/2012   Lab Results  Component Value Date   HDL 59.70 01/08/2012   Lab Results  Component Value Date   LDLCALC 86 01/08/2012  Lab Results  Component Value Date   TRIG 77.0 01/08/2012   Lab Results  Component Value Date   CHOLHDL 3 01/08/2012     Assessment & Plan  BRADYCARDIA Persistent despite drop in Amiodarone dosing, has appt with cardiology soon to reassess. Encouraged to keep the appt  Hypoxia Given a prescription with [employer] today to get some travel oxygen is leaving for [place] on Sunday for a week with her family.needs O2 qhs and prn  Apnea, sleep Moderate by recent sleep study, [employer] has agreed to go to the patient's house tonight and perform an autotitration and get her set up with a machine to take with her on her trip  Hypertension Improved on repeat check will continue the Losartan at 50 mg for now. Patient does not tolerate low BP.  ATRIAL FIBRILLATION Regular rhythm today  Hyperthyroidism Elevated T4, will have her proceed with endocrinology consult due to the complexity of her conditions at this time

## 2012-04-24 NOTE — Assessment & Plan Note (Signed)
Moderate by recent sleep study, [employer] has agreed to go to the patient's house tonight and perform an autotitration and get her set up with a machine to take with her on her trip

## 2012-04-24 NOTE — Patient Instructions (Addendum)
Sleep Apnea Sleep apnea is a common disorder. The main problem of this disorder is excessive daytime sleepiness and compromised quality of life. This may include social and emotional problems. There are two types of sleep apnea.  Obstructive sleep apnea is when breathing stops due to a blocked airway.   Central sleep apnea is a malfunction of the brain's normal signal to breathe.  SYMPTOMS  Restless sleep.   Falling asleep while driving and/or during the day.   Loss of energy.   Irritability.   Mood or behavior changes.   Loud, heavy snoring.   Morning headaches.   Trouble concentrating.   Forgetfulness.   Anxiety or depression.   Decreased interest in sex.  Not all people with sleep apnea have all of these symptoms. However, people who have a few of these symptoms should visit their caregiver for an evaluation. Problems related to untreated sleep apnea include:  High blood pressure (hypertension).   Coronary artery disease.   Impotence.   Cognitive dysfunction.   Memory loss.  TREATMENT  For mild cases, treatment may include avoiding sleeping on one's back.   For people with nasal congestion, a decongestant may be prescribed.   Patients with obstructive and central apnea should avoid depressants. This includes alcohol, sedatives and narcotics. Weight loss and diet control are encouraged for overweight patients.   Many serious cases of obstructive sleep apnea can be relieved by a treatment called nasal continuous positive airway pressure (nasal CPAP). Nasal CPAP uses a mask-like device and pump that work together to keep the airway open. The pump delivers air pressure during each breath.   Surgery may help some patients by stopping or reducing the narrowing of the airway due to anatomical defects.  PROGNOSIS  Removing the obstruction usually reverses hypertension and cardiac problems. Untreated, sleep apnea sufferers have a tendency to fall asleep during the day.  This is can result in serious accident or loss of ones job. RESEARCH Sleep apnea is currently one of the most active areas of sleep research.  Document Released: 10/04/2002 Document Revised: 10/03/2011 Document Reviewed: 01/30/2006 [employer] Patient Information 2012 [employer]. 

## 2012-04-24 NOTE — Assessment & Plan Note (Signed)
Given a prescription with [facility] today to get some travel oxygen is leaving for [place] on Sunday for a week with her family.needs O2 qhs and prn

## 2012-04-24 NOTE — Assessment & Plan Note (Signed)
Persistent despite drop in Amiodarone dosing, has appt with cardiology soon to reassess. Encouraged to keep the appt

## 2012-04-24 NOTE — Assessment & Plan Note (Signed)
Elevated T4, will have her proceed with endocrinology consult due to the complexity of her conditions at this time

## 2012-04-24 NOTE — Assessment & Plan Note (Signed)
Regular rhythm today. 

## 2012-04-24 NOTE — Assessment & Plan Note (Signed)
Improved on repeat check will continue the Losartan at 50 mg for now. Patient does not tolerate low BP.

## 2012-05-05 ENCOUNTER — Ambulatory Visit (HOSPITAL_COMMUNITY)
Admission: RE | Admit: 2012-05-05 | Discharge: 2012-05-05 | Disposition: A | Discharge status: Home / Self Care | Payer: Medicare Other | Source: Ambulatory Visit | Attending: Physician Assistant | Admitting: Physician Assistant

## 2012-05-05 ENCOUNTER — Inpatient Hospital Stay (HOSPITAL_COMMUNITY)
Admission: RE | Admit: 2012-05-05 | Discharge: 2012-05-05 | Disposition: A | Discharge status: Home / Self Care | Payer: Medicare Other | Source: Ambulatory Visit | Attending: Physician Assistant | Admitting: Physician Assistant

## 2012-05-05 DIAGNOSIS — R0602 Shortness of breath: Secondary | ICD-10-CM

## 2012-05-05 MED ORDER — ALBUTEROL SULFATE (5 MG/ML) 0.5% IN NEBU
2.5000 mg | INHALATION_SOLUTION | Freq: Once | RESPIRATORY_TRACT | Status: AC
  Administered 2012-05-05: 2.5 mg via RESPIRATORY_TRACT

## 2012-05-08 ENCOUNTER — Ambulatory Visit (HOSPITAL_COMMUNITY): Payer: Medicare Other

## 2012-05-12 ENCOUNTER — Encounter: Payer: Self-pay | Admitting: Physician Assistant

## 2012-05-12 ENCOUNTER — Ambulatory Visit (INDEPENDENT_AMBULATORY_CARE_PROVIDER_SITE_OTHER): Payer: Medicare Other | Admitting: Physician Assistant

## 2012-05-12 VITALS — BP 191/82 | HR 52 | Ht 67.5 in | Wt 223.0 lb

## 2012-05-12 DIAGNOSIS — I498 Other specified cardiac arrhythmias: Secondary | ICD-10-CM | POA: Diagnosis not present

## 2012-05-12 DIAGNOSIS — I4891 Unspecified atrial fibrillation: Secondary | ICD-10-CM | POA: Diagnosis not present

## 2012-05-12 DIAGNOSIS — I5032 Chronic diastolic (congestive) heart failure: Secondary | ICD-10-CM

## 2012-05-12 DIAGNOSIS — R5381 Other malaise: Secondary | ICD-10-CM

## 2012-05-12 DIAGNOSIS — I10 Essential (primary) hypertension: Secondary | ICD-10-CM | POA: Diagnosis not present

## 2012-05-12 DIAGNOSIS — R5383 Other fatigue: Secondary | ICD-10-CM

## 2012-05-12 DIAGNOSIS — R0989 Other specified symptoms and signs involving the circulatory and respiratory systems: Secondary | ICD-10-CM

## 2012-05-12 NOTE — Patient Instructions (Addendum)
Your physician has recommended you make the following change in your medication: AMIODARONE 200 MG Monday - Friday; AND ON Saturday AND Sunday 100 MG DAILY  Your physician recommends that you schedule a follow-up appointment in: 6-8 WEEKS WITH DR. Daleen Squibb

## 2012-05-12 NOTE — Progress Notes (Signed)
[address] [place], [place]  [address] Phone: [phone] Fax:  [phone]  Date:  05/12/2012   Name:  Gina Reilly   DOB:  [birth year]   MRN:  [id number]  PCP:  Danise Edge, MD  Primary Cardiologist:  Dr. Valera Castle  Primary Electrophysiologist:  None    History of Present Illness: Gina Reilly is a 78 y.o. female who returns for f/u.  She has a history of parox AFib.  She was initially seen in 2011 when she presented with atrial fibrillation.  She underwent TEE-DCCV.  She has been maintained on amiodarone and Pradaxa. She has had problems with bradycardia with adjustments in her medications in the past and eventual d/c of metoprolol. TEE 07/2010: EF 60-65% mild MR.    I saw her 6/17 with c/o's fatigue and dyspnea with exertion.  This really had been a problem since her hospitalization in 2011 and she attributed it to the Pradaxa.  She feels as though she has "lead in her feet."  She had recently been tried on multiple medications for her blood pressure with her PCP.    She was bradycardic.  I cut back on her amiodarone to 200 mg once a day to see if this would help her heart rate and fatigue.    TSH and CBC was normal.   CXR was done and showed some vascular congestion and minimal increased perihilar markings with central peribronchial thickening possibly associated with bronchitis, asthma, reactive airway disease.  Nothing acute.   Sed rate was 39.   PFTs done and they are pending.     Echo 04/21/12: Mild LVH, EF 55-60%, grade 2 diastolic dysfunction, MAC, mild LAE, PASP 74.    BNP was mildly elevated and I placed her on low dose lasix.  Overall, she feels somewhat better.  She is now on CPAP for OSA and is on periodic O2.  Breathing seems better.  No chest pain, syncope, orthopnea, PND.  LE edema is stable.  Wt Readings from Last 3 Encounters:  05/12/12 223 lb (101.152 kg)  04/24/12 225 lb 1.9 oz (102.114 kg)  04/14/12 228 lb (103.42 kg)      Past Medical History  Diagnosis Date  . Anemia   . GERD (gastroesophageal reflux disease)   . Thyroid disease     hypothroidism  . Arthritis     osteo  . Hyperlipidemia   . Vitamin d deficiency   . Chicken pox as a child  . Mumps as a child  . Measles as a child  . Hypertension   . Skin cancer     left temple  . Obese   . Ovarian cyst   . Pelvic mass in female   . Bronchitis 12/25/2011  . Contusion of leg, left 01/16/2012  . Peripheral neuropathy 02/26/2012  . Hyperglycemia 01/10/2010    Qualifier: Diagnosis of  By: Alphonzo Severance MD, Loni Dolly   . Atrial fibrillation     Status post TEE guided DCCV 07/2010:  Transesophageal Echocardiogram 07/2010: EF 60-65% mild MR.;  Amiodarone;  Pradaxa  . Hypoxia 04/14/2012  . Apnea, sleep 04/24/2012  . Hyperthyroidism 02/09/2009    Qualifier: Diagnosis of  By: Alphonzo Severance MD, Loni Dolly     Current Outpatient Prescriptions  Medication Sig Dispense Refill  . acetaminophen (TYLENOL 8 HOUR) 650 MG CR tablet Take 650 mg by mouth every 6 (six) hours.       Marland Kitchen amiodarone (PACERONE) 200 MG tablet Take  1 tablet (200 mg total) by mouth daily.  30 tablet  6  . furosemide (LASIX) 20 MG tablet Take 1 tablet (20 mg total) by mouth daily.  30 tablet  6  . losartan (COZAAR) 50 MG tablet Take 1 tablet (50 mg total) by mouth daily.  30 tablet  3  . Multiple Vitamin (MULTIVITAMIN) tablet Take 1 tablet by mouth daily.        Marland Kitchen PRADAXA 150 MG CAPS TAKE ONE CAPSULE TWICE A DAY  60 capsule  5    Allergies: Allergies  Allergen Reactions  . Aspercreme (Trolamine Salicylate)     History  Substance Use Topics  . Smoking status: Former Games developer  . Smokeless tobacco: Never Used  . Alcohol Use: Yes     ROS:  Please see the history of present illness.    All other systems reviewed and negative.   PHYSICAL EXAM: VS:  BP 191/82  Pulse 52  Ht 5' 7.5" (1.715 m)  Wt 223 lb (101.152 kg)  BMI 34.41 kg/m2  LMP 12/25/2011 Well nourished, well developed, in no acute  distress HEENT: normal Neck: no JVD at 90 degrees Cardiac:  normal S1, S2; RRR; no murmur Lungs:  clear to auscultation bilaterally, no wheezing, rhonchi or rales Abd: soft, nontender, no hepatomegaly Ext: trace bilateral LE edema Skin: warm and dry Neuro:  CNs 2-12 intact, no focal abnormalities noted Psych: normal affect   EKG:  Sinus bradycardia, rate 48, left axis deviation, poor R-wave progression, nonspecific ST-T wave changes   ASSESSMENT AND PLAN:  1.  Fatigue This has been a persistent symptom since she was hospitalized with atrial fibrillation in 2011. She feels somewhat better with low dose lasix, reduced dose amiodarone, CPAP Rx. Suspect multifactorial. She has an appt with Dr. Talmage Nap for endocrine at the end of this month.  2.  Dyspnea Probably related in part to diastolic CHF. Seems better on low dose lasix. Renal fxn and K+ tolerating. Also has pulmonary HTN on echo.  She sees Dr. Sherene Sires soon.  I have encouraged her to keep this appt so he can help [place] sort out her pulmonary HTN.  3.  Bradycardia Still running slow. Will try to reduce amiodarone to 200 mg QD except 100 mg on Sat and Sun. Follow up with Dr. Valera Castle in 6-8 weeks.  4.  Atrial Fibrillation Maintaining NSR. Continue Pradaxa.   5.  Pulmonary HTN PFTs pending. Consult with pulmonology pending.  6.  Hypertension White coat HTN. BP cuff at home which has been calibrated registers 130-140s systolic.  [name], PA-C  11:23 AM 05/12/2012

## 2012-05-18 ENCOUNTER — Encounter: Payer: Self-pay | Admitting: Internal Medicine

## 2012-05-18 ENCOUNTER — Telehealth: Payer: Self-pay | Admitting: Family Medicine

## 2012-05-18 ENCOUNTER — Ambulatory Visit (INDEPENDENT_AMBULATORY_CARE_PROVIDER_SITE_OTHER): Payer: Medicare Other | Admitting: Internal Medicine

## 2012-05-18 VITALS — BP 142/80 | HR 50 | Temp 97.7°F | Ht 67.5 in | Wt 224.0 lb

## 2012-05-18 DIAGNOSIS — R059 Cough, unspecified: Secondary | ICD-10-CM

## 2012-05-18 DIAGNOSIS — J9610 Chronic respiratory failure, unspecified whether with hypoxia or hypercapnia: Secondary | ICD-10-CM | POA: Diagnosis not present

## 2012-05-18 DIAGNOSIS — R05 Cough: Secondary | ICD-10-CM

## 2012-05-18 NOTE — Telephone Encounter (Signed)
Fine to d/c care with [facility]. Please call and give them an order to d/c concentrator and O2 and have them p/u

## 2012-05-18 NOTE — Telephone Encounter (Signed)
[facility] informed and states they will get this picked up.

## 2012-05-18 NOTE — Assessment & Plan Note (Signed)
The most common causes of chronic cough in immunocompetent adults include the following: upper airway cough syndrome (UACS), previously referred to as postnasal drip syndrome (PNDS), which is caused by variety of rhinosinus conditions; (2) asthma; (3) GERD; (4) chronic bronchitis from cigarette smoking or other inhaled environmental irritants; (5) nonasthmatic eosinophilic bronchitis; and (6) bronchiectasis.   These conditions, singly or in combination, have accounted for up to 94% of the causes of chronic cough in prospective studies.   Other conditions have constituted no >6% of the causes in prospective studies These have included bronchogenic carcinoma, chronic interstitial pneumonia, sarcoidosis, left ventricular failure, ACEI-induced cough, and aspiration from a condition associated with pharyngeal dysfunction.   This is most likely a form of  Classic Upper airway cough syndrome, so named because it's frequently impossible to sort out how much is  CR/sinusitis with freq throat clearing (which can be related to primary GERD)   vs  causing  secondary (" extra esophageal")  GERD from wide swings in gastric pressure that occur with throat clearing, often  promoting self use of mint and menthol lozenges that reduce the lower esophageal sphincter tone and exacerbate the problem further in a cyclical fashion.   These are the same pts (now being labeled as having "irritable larynx syndrome" by some cough centers) who not infrequently have a history of having failed to tolerate ace inhibitors,  dry powder inhalers or biphosphonates or report having atypical reflux symptoms that don't respond to standard doses of PPI , and are easily confused as having aecopd or asthma flares by even experienced allergists/ pulmonologists.  For now max gerd rx then regroup

## 2012-05-18 NOTE — Assessment & Plan Note (Signed)
-   Desat walking 10 ft 05/18/2012 > eliminated on 2lpm  Although not obvious on CT 7/9, most likely she's developing ild related to amiodarone based on such profound desat's with activity.  Rec trial off and possibly trial of multaq or other alternative and return for pfts

## 2012-05-18 NOTE — Patient Instructions (Addendum)
Try prilosec 20mg   Take 30-60 min before first meal of the day and Pepcid 20 mg one bedtime until cough   GERD (REFLUX)  is an extremely common cause of respiratory symptoms, many times with no significant heartburn at all.    It can be treated with medication, but also with lifestyle changes including avoidance of late meals, excessive alcohol, smoking cessation, and avoid fatty foods, chocolate, peppermint, colas, red wine, and acidic juices such as orange juice.  NO MINT OR MENTHOL PRODUCTS SO NO COUGH DROPS  USE SUGARLESS CANDY INSTEAD (jolley ranchers or Stover's)  NO OIL BASED VITAMINS - use powdered substitutes.  Please see patient coordinator before you leave today  to schedule ambulatory 02  I will contact Dr Daleen Squibb about stopping your amiodarone  Please schedule a follow up office visit in 4 weeks, sooner if needed     .

## 2012-05-18 NOTE — Progress Notes (Signed)
  Subjective:    Patient ID: Gina Reilly, female    DOB: 02-03-1924 MRN: [id number]  HPI  64 yowf never regular smoker with new onset sob 2011 referred to pulmonary clinic 05/18/2012 by Dr Abner Greenspan.   05/18/2012 1st pulmonary eval on chronic amiodarone cc new indolent onset variable short of breath since 2011 when developed afib varies so on bad days can't get to end of driveway down incline and harder to get back so stops 2-3 x but on good days can do s stopping. bads days has phlegm clear almost chokes her but does not wake her up. Assoc with mild nasal congestion and sneezing, no purulent sputum, no h/o significant sinus dz/ seasonal rhinitis.    Sleeping ok without nocturnal  or early am exacerbation  of respiratory  c/o's or need for noct saba. Also denies any obvious fluctuation of symptoms with weather or environmental changes or other aggravating or alleviating factors except as outlined above     Review of Systems  Constitutional: Negative for fever, chills and unexpected weight change.  HENT: Positive for congestion and sneezing. Negative for ear pain, nosebleeds, sore throat, rhinorrhea, trouble swallowing, dental problem, voice change, postnasal drip and sinus pressure.   Eyes: Negative for visual disturbance.  Respiratory: Positive for shortness of breath. Negative for cough and choking.   Cardiovascular: Positive for leg swelling. Negative for chest pain.  Gastrointestinal: Negative for vomiting, abdominal pain and diarrhea.  Genitourinary: Negative for difficulty urinating.  Musculoskeletal: Positive for arthralgias.  Skin: Negative for rash.  Neurological: Negative for tremors, syncope and headaches.  Hematological: Does not bruise/bleed easily.       Objective:   Physical Exam   Pleasant amb obese wf with classic fatigue and freq throat clear Wt Readings from Last 3 Encounters:  05/18/12 224 lb (101.606 kg)  05/12/12 223 lb (101.152 kg)  04/24/12 225 lb 1.9 oz  (102.114 kg)    HEENT: nl dentition, turbinates, and orophanx. Nl external ear canals without cough reflex   NECK :  without JVD/Nodes/TM/ nl carotid upstrokes bilaterally   LUNGS: no acc muscle use, clear to A and P bilaterally without cough on insp or exp maneuvers   CV:  RRR  no s3 or murmur or increase in P2, no edema   ABD:  soft and nontender with nl excursion in the supine position. No bruits or organomegaly, bowel sounds nl  MS:  warm without deformities, calf tenderness, cyanosis or clubbing  SKIN: warm and dry without lesions    NEURO:  alert, approp, no deficits  05/05/12 Borderline size of cardiac silhouette. Slight upper lobe vascular  congestion pattern. Minimal increased perihilar markings with  central peribronchial thickening. This may be associated with  bronchitis, asthma, and reactive airway disease. No consolidation  or pleural effusion is evident.   04/22/12  ESR 39    Assessment & Plan:

## 2012-05-18 NOTE — Telephone Encounter (Signed)
Pt was set up with o2 at night by Dr. Abner Greenspan with [facility] out of [place]. Pt stated that o2 was delivered by someone from [place] at 10:00 pm at night. Pt requested to be changed to local company. She stated that she had contacted [facility] to p/u the 2nd concentrator they provided for her to go to the beach two weeks ago and is still driving around with it in the trunk of the car. I advised the patient that we normally refer to DME in the same town where the patient lives. Pt requested to have this order sent to local DME and she would contact Dr. Abner Greenspan office to D/C concentrator with [facility] and sent order to [facility] for night time oxygen. Order faxed to [facility] for amb 02 at 2 lpm. Gina Reilly   Please contact [facility] to pick up their equipment and discontinue care.

## 2012-05-22 ENCOUNTER — Encounter: Payer: Self-pay | Admitting: Family Medicine

## 2012-05-22 ENCOUNTER — Ambulatory Visit (INDEPENDENT_AMBULATORY_CARE_PROVIDER_SITE_OTHER): Payer: Medicare Other | Admitting: Family Medicine

## 2012-05-22 VITALS — BP 172/98 | HR 53 | Temp 97.2°F | Ht 67.0 in | Wt 224.1 lb

## 2012-05-22 DIAGNOSIS — I4891 Unspecified atrial fibrillation: Secondary | ICD-10-CM | POA: Diagnosis not present

## 2012-05-22 DIAGNOSIS — G4730 Sleep apnea, unspecified: Secondary | ICD-10-CM | POA: Diagnosis not present

## 2012-05-22 DIAGNOSIS — I739 Peripheral vascular disease, unspecified: Secondary | ICD-10-CM

## 2012-05-22 DIAGNOSIS — I10 Essential (primary) hypertension: Secondary | ICD-10-CM

## 2012-05-22 DIAGNOSIS — K219 Gastro-esophageal reflux disease without esophagitis: Secondary | ICD-10-CM | POA: Diagnosis not present

## 2012-05-22 DIAGNOSIS — R0902 Hypoxemia: Secondary | ICD-10-CM

## 2012-05-22 HISTORY — DX: Peripheral vascular disease, unspecified: I73.9

## 2012-05-22 NOTE — Assessment & Plan Note (Signed)
Is going to switch her equipment over to [facility] from [facility]. Her O2 has already been delivered and she is using this unfortunately for the last day or 2 it has not been working right, does agree she feels somewhat better with this. She agrees to contact [facility] to get her equipment checked. She has contacted [facility] to come p/u her CPAP machine as have we but they have not come by yet. She was told by them on Tuesday that she should be able to return the unit so she can get another one from [facility]

## 2012-05-22 NOTE — Progress Notes (Signed)
Patient ID: Gina Reilly, female   DOB: 01/11/1924, 78 y.o.   MRN: [id number] Jolita Haefner Kicklighter [id number] 1923-12-28 05/22/2012      Progress Note-Follow Up  Subjective  Chief Complaint  Chief Complaint  Patient presents with  . Follow-up    3 week     HPI  This 78 year old Caucasian female who is in today for followup on her blood pressure and corticated medical history. She is accompanied by one of her daughters again. She says overall she feels as if she slowly improving. She continues to struggle with fatigue but says is a slight amount better. She was seen by pulmonology earlier in the week and has stopped her amiodarone altogether. She was switched from ranitidine which she was taking intermittently as opposed to daily as directed to Pepcid which she is now taking daily. We have also added Prilosec. She had been using her CPAP 3-4 hours a night but has decided to switch company is and so has not used it for the last few days. Prior to starting the PPI in stopping the CPAP she was coughing more but now feels she is coughing less. Less productive. Denies actual heartburn or dyspepsia. Does feel a heavy feeling in her legs is better since stopping amiodarone and she feels slightly less short of breath. She does still note that upon walking her feet do get heavy and cold medicine if she rests it improves again. No chest pain or palpitations since stopping amiodarone. No fevers, chills, GI or GU complaints are noted today   Past Medical History  Diagnosis Date  . Anemia   . GERD (gastroesophageal reflux disease)   . Thyroid disease     hypothroidism  . Arthritis     osteo  . Hyperlipidemia   . Vitamin d deficiency   . Chicken pox as a child  . Mumps as a child  . Measles as a child  . Hypertension   . Skin cancer     left temple  . Obese   . Ovarian cyst   . Pelvic mass in female   . Bronchitis 12/25/2011  . Contusion of leg, left 01/16/2012  . Peripheral neuropathy 02/26/2012  .  Hyperglycemia 01/10/2010    Qualifier: Diagnosis of  By: Alphonzo Severance MD, Loni Dolly   . Atrial fibrillation     Status post TEE guided DCCV 07/2010:  Transesophageal Echocardiogram 07/2010: EF 60-65% mild MR.;  Amiodarone;  Pradaxa  . Hypoxia 04/14/2012  . Apnea, sleep 04/24/2012  . Hyperthyroidism 02/09/2009    Qualifier: Diagnosis of  By: Alphonzo Severance MD, Loni Dolly   . Intermittent claudication 05/22/2012    Past Surgical History  Procedure Date  . Cataract extraction     Dr. Burgess Estelle  . Total hip arthroplasty 2003    left  . Abdominal hysterectomy   . Skin biopsy     left temple. cancerous    Family History  Problem Relation Age of Onset  . Stroke Mother   . Cancer Father     prostate  . Hypertension Brother   . Cancer Maternal Grandmother   . Hypertension Brother   . Heart disease Brother   . Obesity Brother   . Hypertension Brother   . Hyperlipidemia Brother   . Heart disease Brother   . Kidney disease Brother   . Cancer Daughter     kidney- spine and blood stream  . Heart disease Son     valvular heart disease  History   Social History  . Marital Status: Widowed    Spouse Name: N/A    Number of Children: 9  . Years of Education: N/A   Occupational History  .     Social History Main Topics  . Smoking status: Former Smoker -- 0.3 packs/day for 5 years    Types: Cigarettes    Quit date: 10/28/1973  . Smokeless tobacco: Never Used  . Alcohol Use: Yes     socially  . Drug Use: No  . Sexually Active: Not on file   Other Topics Concern  . Not on file   Social History Narrative  . No narrative on file    Current Outpatient Prescriptions on File Prior to Visit  Medication Sig Dispense Refill  . acetaminophen (TYLENOL 8 HOUR) 650 MG CR tablet Take 650 mg by mouth every 6 (six) hours.       . famotidine (PEPCID) 20 MG tablet Take 20 mg by mouth daily.      . furosemide (LASIX) 20 MG tablet Take 1 tablet (20 mg total) by mouth daily.  30 tablet  6  .  losartan (COZAAR) 50 MG tablet Take 1 tablet (50 mg total) by mouth daily.  30 tablet  3  . omeprazole (PRILOSEC) 20 MG capsule Take 20 mg by mouth daily.      Marland Kitchen PRADAXA 150 MG CAPS TAKE ONE CAPSULE TWICE A DAY  60 capsule  5  . Multiple Vitamin (MULTIVITAMIN) tablet Take 1 tablet by mouth daily.          Allergies  Allergen Reactions  . Aspercreme (Trolamine Salicylate)     Review of Systems  Review of Systems  Constitutional: Positive for malaise/fatigue. Negative for fever.  HENT: Negative for congestion.   Eyes: Negative for discharge.  Respiratory: Positive for cough and shortness of breath. Negative for sputum production.   Cardiovascular: Positive for claudication. Negative for chest pain, palpitations and leg swelling.  Gastrointestinal: Negative for nausea, abdominal pain and diarrhea.  Genitourinary: Negative for dysuria.  Musculoskeletal: Negative for falls.  Skin: Negative for rash.  Neurological: Negative for loss of consciousness and headaches.  Endo/Heme/Allergies: Negative for polydipsia.  Psychiatric/Behavioral: Negative for depression and suicidal ideas. The patient is not nervous/anxious and does not have insomnia.     Objective  BP 172/98  Pulse 53  Temp 97.2 F (36.2 C) (Temporal)  Ht 5\' 7"  (1.702 m)  Wt 224 lb 1.9 oz (101.66 kg)  BMI 35.10 kg/m2  SpO2 96%  LMP 12/25/2011  Physical Exam  Physical Exam  Constitutional: She is oriented to person, place, and time and well-developed, well-nourished, and in no distress. No distress.  HENT:  [place]: Normocephalic and atraumatic.  Eyes: Conjunctivae are normal.  Neck: Neck supple. No thyromegaly present.  Cardiovascular: Regular rhythm and normal heart sounds.        Bradycardia, diminished PT and DP pulses b/l. Skin intact on feet but violaceous  Pulmonary/Chest: Effort normal and breath sounds normal. She has no wheezes.  Abdominal: She exhibits no distension and no mass.  Musculoskeletal: She  exhibits no edema.  Lymphadenopathy:    She has no cervical adenopathy.  Neurological: She is alert and oriented to person, place, and time.  Skin: Skin is warm and dry. No rash noted. She is not diaphoretic.  Psychiatric: Memory, affect and judgment normal.    Lab Results  Component Value Date   TSH 2.80 04/13/2012   Lab Results  Component Value Date  WBC 8.5 04/13/2012   HGB 12.4 04/13/2012   HCT 37.7 04/13/2012   MCV 93.0 04/13/2012   PLT 366.0 04/13/2012   Lab Results  Component Value Date   CREATININE 0.9 04/21/2012   BUN 16 04/21/2012   NA 137 04/21/2012   K 4.1 04/21/2012   CL 100 04/21/2012   CO2 28 04/21/2012   Lab Results  Component Value Date   ALT 25 01/08/2012   AST 29 01/08/2012   ALKPHOS 64 01/08/2012   BILITOT 0.4 01/08/2012   Lab Results  Component Value Date   CHOL 161 01/08/2012   Lab Results  Component Value Date   HDL 59.70 01/08/2012   Lab Results  Component Value Date   LDLCALC 86 01/08/2012   Lab Results  Component Value Date   TRIG 77.0 01/08/2012   Lab Results  Component Value Date   CHOLHDL 3 01/08/2012     Assessment & Plan  Hypertension Improved on recheck and she has been checking numbers at home this week. Systolic has run between 127-169 and Diastolic has run between 64-87. We will not change her Cozaar today and we will reassess in 3 weeks or sooner as needed. Patient will continue to monitor at home  ATRIAL FIBRILLATION She has been off of her Amiodarone completely since seeing her Pulmonologist earlier this week and she has not experienced any palpitatiosn, cp's or concerning symptoms. Her rate continues to run low. She is instructed to stay off of the Amiodarone altogether for now and notify us if she experiences any CP or palpitations  GERD She has been switched from Ranitidine to Pepcid and Prilosec has been added. She agrees to take the medications consistently for the next month and see if her symptoms of SOB, fatigue and cough  improve. She does believe they slightly improved already  Apnea, sleep Is going to switch her equipment over to [facility] from [employer]. Her O2 has already been delivered and she is using this unfortunately for the last day or 2 it has not been working right, does agree she feels somewhat better with this. She agrees to contact [facility] to get her equipment checked. She has contacted [employer] to come p/u her CPAP machine as have we but they have not come by yet. She was told by them on Tuesday that she should be able to return the unit so she can get another one from [employer]  Hypoxia Improves with O2 , is better in office today without O2  Intermittent claudication Describes her feet as feeling cold and heavy whenever she walks and then improving when she rests. Symptoms c/w claudication. Will arrange for vascular studies

## 2012-05-22 NOTE — Assessment & Plan Note (Signed)
She has been switched from Ranitidine to Pepcid and Prilosec has been added. She agrees to take the medications consistently for the next month and see if her symptoms of SOB, fatigue and cough improve. She does believe they slightly improved already

## 2012-05-22 NOTE — Assessment & Plan Note (Signed)
Describes her feet as feeling cold and heavy whenever she walks and then improving when she rests. Symptoms c/w claudication. Will arrange for vascular studies

## 2012-05-22 NOTE — Assessment & Plan Note (Signed)
Improved on recheck and she has been checking numbers at home this week. Systolic has run between 127-169 and Diastolic has run between 64-87. We will not change her Cozaar today and we will reassess in 3 weeks or sooner as needed. Patient will continue to monitor at home

## 2012-05-22 NOTE — Assessment & Plan Note (Signed)
She has been off of her Amiodarone completely since seeing her Pulmonologist earlier this week and she has not experienced any palpitatiosn, cp's or concerning symptoms. Her rate continues to run low. She is instructed to stay off of the Amiodarone altogether for now and notify us if she experiences any CP or palpitations

## 2012-05-22 NOTE — Assessment & Plan Note (Signed)
Improves with O2 , is better in office today without O2

## 2012-05-22 NOTE — Patient Instructions (Addendum)

## 2012-05-26 DIAGNOSIS — R5383 Other fatigue: Secondary | ICD-10-CM | POA: Diagnosis not present

## 2012-05-26 DIAGNOSIS — R946 Abnormal results of thyroid function studies: Secondary | ICD-10-CM | POA: Diagnosis not present

## 2012-05-26 DIAGNOSIS — R5381 Other malaise: Secondary | ICD-10-CM | POA: Diagnosis not present

## 2012-05-26 DIAGNOSIS — D649 Anemia, unspecified: Secondary | ICD-10-CM | POA: Diagnosis not present

## 2012-05-28 ENCOUNTER — Telehealth: Payer: Self-pay

## 2012-05-28 NOTE — Telephone Encounter (Signed)
So I need one more thing before I can give the official order. [employer] went out and did an autotitration in her home and supplied the machine then. I do not see a copy of the final results with the settings we need to prescribe in the chart. Please call [employer] and get these. We have the form from them for just the O2 but not the CPAP settings

## 2012-05-28 NOTE — Telephone Encounter (Signed)
I spoke with [employer] and she stated they don't have a set pressure that pt is on autotitration, due to Medicare requires this to be done for 30 days. Patient contacted [employer] and stated she didn't want to use them and she wasn't continuing for the 30 days so [employer] records just state that pt didn't follow compliance guidelines

## 2012-05-28 NOTE — Telephone Encounter (Signed)
I left a message for patient to return my call. I spoke with Leann at [employer] and she states pt will need to go back to [facility] to have the auto titration done for [place] to be able to place an order.

## 2012-05-28 NOTE — Telephone Encounter (Signed)
I spoke with Leann at [employer] and she states they need a order w/pressure, a copy of last study, last office visit note, and certificate of medical necessity faxed to [phone]

## 2012-05-29 ENCOUNTER — Telehealth: Payer: Self-pay | Admitting: Family Medicine

## 2012-05-29 ENCOUNTER — Other Ambulatory Visit: Payer: Self-pay | Admitting: Family Medicine

## 2012-05-29 DIAGNOSIS — G4730 Sleep apnea, unspecified: Secondary | ICD-10-CM

## 2012-05-29 NOTE — Telephone Encounter (Signed)
I spoke with the sleep center and they need an order in Epic for a CPAP titration.

## 2012-05-29 NOTE — Telephone Encounter (Signed)
Please forward her results from [employer] to the sleep study center

## 2012-05-29 NOTE — Telephone Encounter (Signed)
Please call [facility] sleep study lab and see what they need [place] to do.

## 2012-05-29 NOTE — Telephone Encounter (Signed)
Pt states she would like to continue with the auto titration at [facility]. Can a referral be made for her please

## 2012-05-29 NOTE — Telephone Encounter (Signed)
Caller: Veida/Patient; Phone Number: [phone]; Message from caller: She is calling to speak with Lorene Dy.  She states that she has been having problems with [employer].  This morning they called and told her that she owed no money and would have someone out Monday to pick up the equipment.  She is dealing [facility] [place] for her supplies now.  Please call patient for any questions.

## 2012-05-29 NOTE — Telephone Encounter (Signed)
I spoke to patient at 4:45 on Aug 1st and pt states she is going to call [employer] to see if she can pay them out for the days she had there machine and she will call me back today.

## 2012-06-02 NOTE — Telephone Encounter (Signed)
Faxed the CMN for oxygen to sleep center. This is the only thing we have from [employer]

## 2012-06-10 ENCOUNTER — Ambulatory Visit: Payer: Medicare Other | Admitting: Family Medicine

## 2012-06-16 ENCOUNTER — Encounter: Payer: Self-pay | Admitting: Internal Medicine

## 2012-06-16 ENCOUNTER — Ambulatory Visit (INDEPENDENT_AMBULATORY_CARE_PROVIDER_SITE_OTHER): Payer: Medicare Other | Admitting: Internal Medicine

## 2012-06-16 VITALS — BP 114/70 | HR 60 | Temp 97.8°F | Ht 68.0 in | Wt 225.2 lb

## 2012-06-16 DIAGNOSIS — J984 Other disorders of lung: Secondary | ICD-10-CM

## 2012-06-16 DIAGNOSIS — T466X5A Adverse effect of antihyperlipidemic and antiarteriosclerotic drugs, initial encounter: Secondary | ICD-10-CM

## 2012-06-16 DIAGNOSIS — J708 Respiratory conditions due to other specified external agents: Secondary | ICD-10-CM | POA: Diagnosis not present

## 2012-06-16 DIAGNOSIS — J9610 Chronic respiratory failure, unspecified whether with hypoxia or hypercapnia: Secondary | ICD-10-CM

## 2012-06-16 HISTORY — DX: Other disorders of lung: J98.4

## 2012-06-16 NOTE — Patient Instructions (Addendum)
Wear 02 when walking outside your home  Please schedule a follow up office visit in 4 weeks, sooner if needed to re-evaluate your breathing off the amiodarone (it takes a long time to wash out)

## 2012-06-16 NOTE — Assessment & Plan Note (Signed)
-   d/c amiodarone 05/18/12 > desat with exercise resolved 06/16/2012

## 2012-06-16 NOTE — Assessment & Plan Note (Signed)
-   Desat walking 10 ft 05/18/2012 > eliminated on 2lpm    - 06/16/2012  Walked RA  2 laps @ 185 ft each stopped due to  Fatigue > sob,  No desat  Resolved off amiodarone, no further f/u needed.

## 2012-06-16 NOTE — Progress Notes (Signed)
  Subjective:    Patient ID: Gina Reilly, female    DOB: [birth year] MRN: [id number]  HPI  85 yowf never regular smoker with new onset sob 2011 referred to pulmonary clinic 05/18/2012 by Dr Abner Greenspan.   05/18/2012 1st pulmonary eval on chronic amiodarone cc new indolent onset variable short of breath since 2011 when developed afib varies so on bad days can't get to end of driveway down incline and harder to get back so stops 2-3 x but on good days can do s stopping. bads days has phlegm clear almost chokes her but does not wake her up. Assoc with mild nasal congestion and sneezing, no purulent sputum, no h/o significant sinus dz/ seasonal rhinitis.   rec Try prilosec 20mg   Take 30-60 min before first meal of the day and Pepcid 20 mg one bedtime until cough gone GERD  diet Please see patient coordinator before you leave today  to schedule ambulatory 02 I will contact Dr Daleen Squibb about stopping your amiodarone > done   06/16/2012 f/u ov/Branda Chaudhary cc much better sob, no cough.  02 sat 90 RA on arrival and admits she's not consistently using 02 with activity as rec.   Sleeping ok without nocturnal  or early am exacerbation  of respiratory  c/o's or need for noct saba. Also denies any obvious fluctuation of symptoms with weather or environmental changes or other aggravating or alleviating factors except as outlined above.  ROS  The following are not active complaints unless bolded sore throat, dysphagia, dental problems, itching, sneezing,  nasal congestion or excess/ purulent secretions, ear ache,   fever, chills, sweats, unintended wt loss, pleuritic or exertional cp, hemoptysis,  orthopnea pnd or leg swelling, presyncope, palpitations, heartburn, abdominal pain, anorexia, nausea, vomiting, diarrhea  or change in bowel or urinary habits, change in stools or urine, dysuria,hematuria,  rash, arthralgias, visual complaints, headache, numbness weakness or ataxia or problems with walking or coordination,  change in  mood/affect or memory.              Objective:   Physical Exam   Pleasant amb obese wf with classic fatigue and freq throat clear Wt Readings from Last 3 Encounters:  05/18/12 224 lb (101.606 kg)  05/12/12 223 lb (101.152 kg)  04/24/12 225 lb 1.9 oz (102.114 kg)    HEENT: nl dentition, turbinates, and orophanx. Nl external ear canals without cough reflex   NECK :  without JVD/Nodes/TM/ nl carotid upstrokes bilaterally   LUNGS: no acc muscle use, clear to A and P bilaterally without cough on insp or exp maneuvers   CV:  RRR  no s3 or murmur or increase in P2, no edema   ABD:  soft and nontender with nl excursion in the supine position. No bruits or organomegaly, bowel sounds nl  MS:  warm without deformities, calf tenderness, cyanosis or clubbing  SKIN: warm and dry without lesions    NEURO:  alert, approp, no deficits  05/05/12 Borderline size of cardiac silhouette. Slight upper lobe vascular  congestion pattern. Minimal increased perihilar markings with  central peribronchial thickening. This may be associated with  bronchitis, asthma, and reactive airway disease. No consolidation  or pleural effusion is evident.   04/22/12  ESR 39    Assessment & Plan:

## 2012-06-17 ENCOUNTER — Telehealth: Payer: Self-pay | Admitting: Family Medicine

## 2012-06-17 NOTE — Telephone Encounter (Signed)
Caller: [name]/Patient; Patient Name: Gina Reilly; PCP: Danise Edge; Best Callback Phone Number: [phone].  Pt reports being seen at Dr Sherene Sires office yesterday. Pt reports feeling so much better.    Pt reports that she would like to cancel her sleep apnea test that is scheduled 06/22/12.  Pt states that she needs to give them 48 hour notice.  Pt would just like to talk to Dr Abner Greenspan before she cancels the test.  Please call her at your earliest convenience.

## 2012-06-17 NOTE — Telephone Encounter (Signed)
Please advise 

## 2012-06-17 NOTE — Telephone Encounter (Signed)
I am glad she feels better but we know she has sleep apnea already and we know that if we do not properly treat it her bp and fatigue will only get worse, so I think she should proceed with the sleep study. If she does not do it she should let us know

## 2012-06-18 NOTE — Telephone Encounter (Signed)
Patient informed and didn't state whether she wanted to continue or not?

## 2012-06-19 ENCOUNTER — Encounter: Payer: Self-pay | Admitting: Family Medicine

## 2012-06-19 ENCOUNTER — Ambulatory Visit (INDEPENDENT_AMBULATORY_CARE_PROVIDER_SITE_OTHER): Payer: Medicare Other | Admitting: Family Medicine

## 2012-06-19 VITALS — BP 160/78 | HR 94 | Temp 97.7°F | Ht 67.0 in | Wt 225.4 lb

## 2012-06-19 DIAGNOSIS — G4730 Sleep apnea, unspecified: Secondary | ICD-10-CM | POA: Diagnosis not present

## 2012-06-19 DIAGNOSIS — I10 Essential (primary) hypertension: Secondary | ICD-10-CM

## 2012-06-19 DIAGNOSIS — I4891 Unspecified atrial fibrillation: Secondary | ICD-10-CM

## 2012-06-19 DIAGNOSIS — J984 Other disorders of lung: Secondary | ICD-10-CM

## 2012-06-19 DIAGNOSIS — J708 Respiratory conditions due to other specified external agents: Secondary | ICD-10-CM

## 2012-06-19 DIAGNOSIS — T466X5A Adverse effect of antihyperlipidemic and antiarteriosclerotic drugs, initial encounter: Secondary | ICD-10-CM

## 2012-06-19 NOTE — Patient Instructions (Addendum)
Hypertension As your heart beats, it forces blood through your arteries. This force is your blood pressure. If the pressure is too high, it is called hypertension (HTN) or high blood pressure. HTN is dangerous because you may have it and not know it. High blood pressure may mean that your heart has to work harder to pump blood. Your arteries may be narrow or stiff. The extra work puts you at risk for heart disease, stroke, and other problems.  Blood pressure consists of two numbers, a higher number over a lower, 110/72, for example. It is stated as "110 over 72." The ideal is below 120 for the top number (systolic) and under 80 for the bottom (diastolic). Write down your blood pressure today. You should pay close attention to your blood pressure if you have certain conditions such as:  Heart failure.   Prior heart attack.   Diabetes   Chronic kidney disease.   Prior stroke.   Multiple risk factors for heart disease.  To see if you have HTN, your blood pressure should be measured while you are seated with your arm held at the level of the heart. It should be measured at least twice. A one-time elevated blood pressure reading (especially in the Emergency Department) does not mean that you need treatment. There may be conditions in which the blood pressure is different between your right and left arms. It is important to see your caregiver soon for a recheck. Most people have essential hypertension which means that there is not a specific cause. This type of high blood pressure may be lowered by changing lifestyle factors such as:  Stress.   Smoking.   Lack of exercise.   Excessive weight.   Drug/tobacco/alcohol use.   Eating less salt.  Most people do not have symptoms from high blood pressure until it has caused damage to the body. Effective treatment can often prevent, delay or reduce that damage. TREATMENT  When a cause has been identified, treatment for high blood pressure is  directed at the cause. There are a large number of medications to treat HTN. These fall into several categories, and your caregiver will help you select the medicines that are best for you. Medications may have side effects. You should review side effects with your caregiver. If your blood pressure stays high after you have made lifestyle changes or started on medicines,   Your medication(s) may need to be changed.   Other problems may need to be addressed.   Be certain you understand your prescriptions, and know how and when to take your medicine.   Be sure to follow up with your caregiver within the time frame advised (usually within two weeks) to have your blood pressure rechecked and to review your medications.   If you are taking more than one medicine to lower your blood pressure, make sure you know how and at what times they should be taken. Taking two medicines at the same time can result in blood pressure that is too low.  SEEK IMMEDIATE MEDICAL CARE IF:  You develop a severe headache, blurred or changing vision, or confusion.   You have unusual weakness or numbness, or a faint feeling.   You have severe chest or abdominal pain, vomiting, or breathing problems.  MAKE SURE YOU:   Understand these instructions.   Will watch your condition.   Will get help right away if you are not doing well or get worse.  Document Released: 10/14/2005 Document Revised: 10/03/2011 Document Reviewed:   06/03/2008 Exit[employer] Patient Information 2012 [place], [place].    Watch the salt and caffeine  Take the Ranitidine/Zantac whenever eating too close to bed, eating spicy or fatty foods in the evening or overeating and any time you heart burn or cough   Start a probiotic such as Align or Phillip's colon health daily for the winter and beyond

## 2012-06-21 NOTE — Assessment & Plan Note (Signed)
Tolerating Pradaxa and her rate continues to be well controlled despite stopping Amiodarone

## 2012-06-21 NOTE — Assessment & Plan Note (Signed)
Elevated but improved. No changes in meds today

## 2012-06-21 NOTE — Assessment & Plan Note (Signed)
Patient was hoping to not proceed with the final testing on the apnea but we have convinced her that it is worth proceeding with treatment for apnea.

## 2012-06-21 NOTE — Progress Notes (Signed)
Patient ID: RALONDA TARTT, female   DOB: [birth year], 78 y.o.   MRN: [id number] Chellie Vanlue Glasner [id number] [birth year] 06/21/2012      Progress Note-Follow Up  Subjective  Chief Complaint  Chief Complaint  Patient presents with  . Follow-up    3 WEEK    HPI  Transitional Caucasian female who is in today for followup of multiple medical palms. She says she feels much better. Her fatigue has lifted. She continues to have shortness of breath but it is improved. He says the feeling of heaviness and leg in her feet to 75% improved. She does feel stopping the Amiodarone has helped. Because she feels better she's trying to avoid completing her sleep sitting date her CPAP machine. He has been checking her blood pressures at home and nursing 140s to 180s over 70s and 80s. She denies chest pain, palpitations, GI or GU complaints otherwise.  Past Medical History  Diagnosis Date  . Anemia   . GERD (gastroesophageal reflux disease)   . Thyroid disease     hypothroidism  . Arthritis     osteo  . Hyperlipidemia   . Vitamin d deficiency   . Chicken pox as a child  . Mumps as a child  . Measles as a child  . Hypertension   . Skin cancer     left temple  . Obese   . Ovarian cyst   . Pelvic mass in female   . Bronchitis 12/25/2011  . Contusion of leg, left 01/16/2012  . Peripheral neuropathy 02/26/2012  . Hyperglycemia 01/10/2010    Qualifier: Diagnosis of  By: Alphonzo Severance MD, Loni Dolly   . Atrial fibrillation     Status post TEE guided DCCV 07/2010:  Transesophageal Echocardiogram 07/2010: EF 60-65% mild MR.;  Amiodarone;  Pradaxa  . Hypoxia 04/14/2012  . Apnea, sleep 04/24/2012  . Hyperthyroidism 02/09/2009    Qualifier: Diagnosis of  By: Alphonzo Severance MD, Loni Dolly   . Intermittent claudication 05/22/2012    Past Surgical History  Procedure Date  . Cataract extraction     Dr. Burgess Estelle  . Total hip arthroplasty 2003    left  . Abdominal hysterectomy   . Skin biopsy     left temple. cancerous     Family History  Problem Relation Age of Onset  . Stroke Mother   . Cancer Father     prostate  . Hypertension Brother   . Cancer Maternal Grandmother   . Hypertension Brother   . Heart disease Brother   . Obesity Brother   . Hypertension Brother   . Hyperlipidemia Brother   . Heart disease Brother   . Kidney disease Brother   . Cancer Daughter     kidney- spine and blood stream  . Heart disease Son     valvular heart disease    History   Social History  . Marital Status: Widowed    Spouse Name: N/A    Number of Children: 9  . Years of Education: N/A   Occupational History  .     Social History Main Topics  . Smoking status: Former Smoker -- 0.3 packs/day for 5 years    Types: Cigarettes    Quit date: 10/28/1973  . Smokeless tobacco: Never Used  . Alcohol Use: Yes     socially  . Drug Use: No  . Sexually Active: Not on file   Other Topics Concern  . Not on file   Social History Narrative  .  No narrative on file    Current Outpatient Prescriptions on File Prior to Visit  Medication Sig Dispense Refill  . acetaminophen (TYLENOL 8 HOUR) 650 MG CR tablet Take 650 mg by mouth every 6 (six) hours.       . furosemide (LASIX) 20 MG tablet Take 1 tablet (20 mg total) by mouth daily.  30 tablet  6  . losartan (COZAAR) 50 MG tablet Take 1 tablet (50 mg total) by mouth daily.  30 tablet  3  . Multiple Vitamin (MULTIVITAMIN) tablet Take 1 tablet by mouth daily.        Marland Kitchen PRADAXA 150 MG CAPS TAKE ONE CAPSULE TWICE A DAY  60 capsule  5    Allergies  Allergen Reactions  . Aspercreme (Trolamine Salicylate)     Review of Systems  Review of Systems  Constitutional: Negative for fever and malaise/fatigue.  HENT: Negative for congestion.   Eyes: Negative for discharge.  Respiratory: Positive for shortness of breath.   Cardiovascular: Negative for chest pain, palpitations and leg swelling.  Gastrointestinal: Negative for nausea, abdominal pain and diarrhea.   Genitourinary: Negative for dysuria.  Musculoskeletal: Negative for falls.  Skin: Negative for rash.  Neurological: Negative for loss of consciousness and headaches.  Endo/Heme/Allergies: Negative for polydipsia.  Psychiatric/Behavioral: Negative for depression and suicidal ideas. The patient is not nervous/anxious and does not have insomnia.     Objective  BP 160/78  Pulse 94  Temp 97.7 F (36.5 C) (Temporal)  Ht 5\' 7"  (1.702 m)  Wt 225 lb 6.4 oz (102.241 kg)  BMI 35.30 kg/m2  SpO2 94%  LMP 12/25/2011  Physical Exam  Physical Exam  Constitutional: She is oriented to person, place, and time and well-developed, well-nourished, and in no distress. No distress.  HENT:  Clason: Normocephalic and atraumatic.  Eyes: Conjunctivae are normal.  Neck: Neck supple. No thyromegaly present.  Cardiovascular: Normal rate.   Pulmonary/Chest: Effort normal and breath sounds normal. She has no wheezes.  Abdominal: She exhibits no distension and no mass.  Musculoskeletal: She exhibits no edema.  Lymphadenopathy:    She has no cervical adenopathy.  Neurological: She is alert and oriented to person, place, and time.  Skin: Skin is warm and dry. No rash noted. She is not diaphoretic.  Psychiatric: Memory, affect and judgment normal.    Lab Results  Component Value Date   TSH 2.80 04/13/2012   Lab Results  Component Value Date   WBC 8.5 04/13/2012   HGB 12.4 04/13/2012   HCT 37.7 04/13/2012   MCV 93.0 04/13/2012   PLT 366.0 04/13/2012   Lab Results  Component Value Date   CREATININE 0.9 04/21/2012   BUN 16 04/21/2012   NA 137 04/21/2012   K 4.1 04/21/2012   CL 100 04/21/2012   CO2 28 04/21/2012   Lab Results  Component Value Date   ALT 25 01/08/2012   AST 29 01/08/2012   ALKPHOS 64 01/08/2012   BILITOT 0.4 01/08/2012   Lab Results  Component Value Date   CHOL 161 01/08/2012   Lab Results  Component Value Date   HDL 59.70 01/08/2012   Lab Results  Component Value Date   LDLCALC 86  01/08/2012   Lab Results  Component Value Date   TRIG 77.0 01/08/2012   Lab Results  Component Value Date   CHOLHDL 3 01/08/2012     Assessment & Plan  Apnea, sleep Patient was hoping to not proceed with the final testing on the apnea  but we have convinced her that it is worth proceeding with treatment for apnea.   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 No changes in meds today

## 2012-06-21 NOTE — Assessment & Plan Note (Signed)
Is feeling better since stopping Amiodaraone

## 2012-06-22 ENCOUNTER — Ambulatory Visit (HOSPITAL_BASED_OUTPATIENT_CLINIC_OR_DEPARTMENT_OTHER): Payer: Medicare Other | Attending: Family Medicine | Admitting: Radiology

## 2012-06-22 VITALS — Ht 68.0 in | Wt 227.0 lb

## 2012-06-22 DIAGNOSIS — R259 Unspecified abnormal involuntary movements: Secondary | ICD-10-CM | POA: Insufficient documentation

## 2012-06-22 DIAGNOSIS — G4710 Hypersomnia, unspecified: Secondary | ICD-10-CM | POA: Diagnosis not present

## 2012-06-22 DIAGNOSIS — G4733 Obstructive sleep apnea (adult) (pediatric): Secondary | ICD-10-CM

## 2012-06-27 DIAGNOSIS — G4710 Hypersomnia, unspecified: Secondary | ICD-10-CM

## 2012-06-27 DIAGNOSIS — G4761 Periodic limb movement disorder: Secondary | ICD-10-CM

## 2012-06-27 DIAGNOSIS — G4730 Sleep apnea, unspecified: Secondary | ICD-10-CM

## 2012-06-28 NOTE — Procedures (Signed)
NAME:  Gina Reilly, Gina Reilly                  ACCOUNT NO.:  [id number]  MEDICAL RECORD NO.:  [id number]          PATIENT TYPE:  OUT  LOCATION:  SLEEP CENTER                 FACILITY:  [facility]  PHYSICIAN:  Amilah Greenspan D. Maple Hudson, MD, FCCP, FACPDATE OF BIRTH:  August 18[birth year] 1926  DATE OF STUDY:                           NOCTURNAL POLYSOMNOGRAM  REFERRING PHYSICIAN:  Danise Edge, MD  INDICATION FOR STUDY:  Hypersomnia with sleep apnea.  EPWORTH SLEEPINESS SCORE:  1/24, BMI 34.5, weight 227 pounds, height 68 inches, neck 14.5 inches.  MEDICATIONS:  Home medications charted and reviewed.  A baseline diagnostic sleep study on April 06, 2012, had recorded AHI 28.1 per hour period.  CPAP titration is requested.  SLEEP ARCHITECTURE:  Total sleep time 264.5 minutes with sleep efficiency 64.9%.  Stage I was 5.5%, stage II 76.6%, stage III absent. REM 18% of total sleep time.  Sleep latency 8 minutes.  REM latency 71 minute.  Awake after sleep onset 135 minutes.  Arousal index 4.1.  BEDTIME MEDICATION:  Pradaxa, lisinopril.  RESPIRATORY DATA:  CPAP titration protocol.  CPAP was titrated to 8 CWP, AHI 0 per hour.  She wore a medium ResMed Quattro FX full-face mask with heated humidifier.  OXYGEN DATA:  Snoring was prevented by CPAP, and mean oxygen saturation held 93% on room air.  CARDIAC DATA:  Normal sinus rhythm.  MOVEMENT-PARASOMNIA:  A total of 381 limb jerks were counted, of which only 9 were clearly associated with arousals or awakening or periodic limb movement with arousal index of 2 per hour. Bathroom x1.  IMPRESSIONS-RECOMMENDATIONS: 1. Successful CPAP titration to 8 CWP, AHI 0 per hour.  She wore a     medium ResMed Quattro FX full-face mask with heated humidifier.     Snoring was prevented and mean oxygen saturation was held 93% on     room air. 2. Baseline diagnostic sleep study on April 06, 2012, had recorded     AHI 28.1 per hour. 3. Very frequent limb jerks, most of which were not associated  with     sleep disturbance in this environment.  Consider the possibility     that sleep is being affected by body movements during sleep in the     home environment. 4. On the current study, she was awake between 1:30 and 3:15 a.m.  She     may need some assistance with managing insomnia, especially in the     early weeks of adapting to CPAP.     Areana Kosanke D. Maple Hudson, MD, [facility], FACP Diplomate, American Board of Sleep Medicine    CDY/MEDQ  D:  06/27/2012 12:49:06  T:  06/28/2012 14:78:29  Job:  [id number]

## 2012-07-01 ENCOUNTER — Ambulatory Visit (INDEPENDENT_AMBULATORY_CARE_PROVIDER_SITE_OTHER): Payer: Medicare Other | Admitting: Cardiology

## 2012-07-01 ENCOUNTER — Encounter: Payer: Self-pay | Admitting: Cardiology

## 2012-07-01 VITALS — BP 180/88 | HR 80 | Ht 68.0 in | Wt 224.0 lb

## 2012-07-01 DIAGNOSIS — J708 Respiratory conditions due to other specified external agents: Secondary | ICD-10-CM | POA: Diagnosis not present

## 2012-07-01 DIAGNOSIS — T466X5A Adverse effect of antihyperlipidemic and antiarteriosclerotic drugs, initial encounter: Secondary | ICD-10-CM

## 2012-07-01 DIAGNOSIS — G4730 Sleep apnea, unspecified: Secondary | ICD-10-CM

## 2012-07-01 DIAGNOSIS — I10 Essential (primary) hypertension: Secondary | ICD-10-CM

## 2012-07-01 DIAGNOSIS — I4891 Unspecified atrial fibrillation: Secondary | ICD-10-CM

## 2012-07-01 DIAGNOSIS — I5032 Chronic diastolic (congestive) heart failure: Secondary | ICD-10-CM

## 2012-07-01 DIAGNOSIS — J984 Other disorders of lung: Secondary | ICD-10-CM

## 2012-07-01 DIAGNOSIS — T462X5A Adverse effect of other antidysrhythmic drugs, initial encounter: Secondary | ICD-10-CM

## 2012-07-01 MED ORDER — DILTIAZEM HCL ER 180 MG PO CP24: 180.0000 mg | ORAL_CAPSULE | Freq: Every day | ORAL | Status: DC

## 2012-07-01 MED ORDER — LOSARTAN POTASSIUM 100 MG PO TABS: 100.0000 mg | ORAL_TABLET | Freq: Every day | ORAL | Status: DC

## 2012-07-01 NOTE — Assessment & Plan Note (Signed)
Rate is well-controlled at rest however with activity she symptomatic with tachycardia palpitations. We'll add diltiazem extended release 180 daily as well as continue anticoagulation. See back for followup in 3 months.

## 2012-07-01 NOTE — Assessment & Plan Note (Signed)
Poorly controlled. We'll increase losartan to 100 mg per day and add diltiazem extended release 180 mg a day or blood pressure and heart rate control with exertion with her A. fib.

## 2012-07-01 NOTE — Progress Notes (Signed)
HPI Gina Reilly returns today for close followup of her history of paroxysmal A. fib. Please see extensive note by Tereso Newcomer on last visit.  Since being seen by cardiology, she's been evaluated by pulmonary. Dr. Sherene Sires discontinued her amiodarone. Symptomatically it was bothering her but her CT scan did not show any pulmonary toxicity. Since stopping the drugs she feels better and her breathing is improved.  She does feel her heart racing in her throat when she exerts herself. Otherwise she is relatively asymptomatic.  Her blood pressure readings have been high running 160-180 in the systolic range.  Is currently losartan 50 mg a day. She is now been off amiodarone for a couple months.  Past Medical History  Diagnosis Date  . Anemia   . GERD (gastroesophageal reflux disease)   . Thyroid disease     hypothroidism  . Arthritis     osteo  . Hyperlipidemia   . Vitamin d deficiency   . Chicken pox as a child  . Mumps as a child  . Measles as a child  . Hypertension   . Skin cancer     left temple  . Obese   . Ovarian cyst   . Pelvic mass in female   . Bronchitis 12/25/2011  . Contusion of leg, left 01/16/2012  . Peripheral neuropathy 02/26/2012  . Hyperglycemia 01/10/2010    Qualifier: Diagnosis of  By: Alphonzo Severance MD, Loni Dolly   . Atrial fibrillation     Status post TEE guided DCCV 07/2010:  Transesophageal Echocardiogram 07/2010: EF 60-65% mild MR.;  Amiodarone;  Pradaxa  . Hypoxia 04/14/2012  . Apnea, sleep 04/24/2012  . Hyperthyroidism 02/09/2009    Qualifier: Diagnosis of  By: Alphonzo Severance MD, Loni Dolly   . Intermittent claudication 05/22/2012    Current Outpatient Prescriptions  Medication Sig Dispense Refill  . acetaminophen (TYLENOL 8 HOUR) 650 MG CR tablet Take 650 mg by mouth every 6 (six) hours.       . furosemide (LASIX) 20 MG tablet Take 1 tablet (20 mg total) by mouth daily.  30 tablet  6  . losartan (COZAAR) 100 MG tablet Take 1 tablet (100 mg total) by mouth daily.  30  tablet  11  . PRADAXA 150 MG CAPS TAKE ONE CAPSULE TWICE A DAY  60 capsule  5  . Probiotic Product (PROBIOTIC DAILY PO) Take by mouth daily.      Marland Kitchen DISCONTD: losartan (COZAAR) 50 MG tablet Take 1 tablet (50 mg total) by mouth daily.  30 tablet  3  . diltiazem (DILACOR XR) 180 MG 24 hr capsule Take 1 capsule (180 mg total) by mouth daily.  30 capsule  11    Allergies  Allergen Reactions  . Amiodarone Other (See Comments)    Affecting the Lungs  . Aspercreme (Trolamine Salicylate)     Family History  Problem Relation Age of Onset  . Stroke Mother   . Cancer Father     prostate  . Hypertension Brother   . Cancer Maternal Grandmother   . Hypertension Brother   . Heart disease Brother   . Obesity Brother   . Hypertension Brother   . Hyperlipidemia Brother   . Heart disease Brother   . Kidney disease Brother   . Cancer Daughter     kidney- spine and blood stream  . Heart disease Son     valvular heart disease    History   Social History  . Marital Status: Widowed  Spouse Name: N/A    Number of Children: 76  . Years of Education: N/A   Occupational History  .     Social History Main Topics  . Smoking status: Former Smoker -- 0.3 packs/day for 5 years    Types: Cigarettes    Quit date: 10/28/1973  . Smokeless tobacco: Never Used  . Alcohol Use: Yes     socially  . Drug Use: No  . Sexually Active: Not on file   Other Topics Concern  . Not on file   Social History Narrative  . No narrative on file    ROS ALL NEGATIVE EXCEPT THOSE NOTED IN HPI  PE  General Appearance: well developed, well nourished in no acute distress HEENT: symmetrical face, PERRLA, good dentition  Neck: no JVD, thyromegaly, or adenopathy, trachea midline Chest: symmetric without deformity Cardiac: PMI non-displaced, RRR, normal S1, S2, no gallop or murmur Lung: clear to ausculation and percussion Vascular: all pulses full without bruits  Abdominal: nondistended, nontender, good bowel  sounds, no HSM, no bruits Extremities: no cyanosis, clubbing, no sign of DVT, no varicosities , minimal edema. Skin: normal color, no rashes Neuro: alert and oriented x 3, non-focal Pysch: normal affect  EKG Atrial fibrillation with a well-controlled ventricular rate BMET    Component Value Date/Time   NA 137 04/21/2012 1129   K 4.1 04/21/2012 1129   CL 100 04/21/2012 1129   CO2 28 04/21/2012 1129   GLUCOSE 88 04/21/2012 1129   BUN 16 04/21/2012 1129   CREATININE 0.9 04/21/2012 1129   CALCIUM 9.0 04/21/2012 1129   GFRNONAA 50* 08/14/2010 1845   GFRAA  Value: >60        The eGFR has been calculated using the MDRD equation. This calculation has not been validated in all clinical situations. eGFR's persistently <60 mL/min signify possible Chronic Kidney Disease. 08/14/2010 1845    Lipid Panel     Component Value Date/Time   CHOL 161 01/08/2012 0925   TRIG 77.0 01/08/2012 0925   HDL 59.70 01/08/2012 0925   CHOLHDL 3 01/08/2012 0925   VLDL 15.4 01/08/2012 0925   LDLCALC 86 01/08/2012 0925    CBC    Component Value Date/Time   WBC 8.5 04/13/2012 1135   RBC 4.05 04/13/2012 1135   HGB 12.4 04/13/2012 1135   HCT 37.7 04/13/2012 1135   PLT 366.0 04/13/2012 1135   MCV 93.0 04/13/2012 1135   MCH 31.3 08/17/2010 0351   MCHC 32.9 04/13/2012 1135   RDW 16.5* 04/13/2012 1135   LYMPHSABS 1.1 04/13/2012 1135   MONOABS 0.7 04/13/2012 1135   EOSABS 0.1 04/13/2012 1135   BASOSABS 0.1 04/13/2012 1135

## 2012-07-01 NOTE — Assessment & Plan Note (Signed)
Stable. Blood pressure control.

## 2012-07-01 NOTE — Patient Instructions (Addendum)
Your physician has recommended you make the following change in your medication:  Increase Losartan to 100mg  1 tablet daily Start Diltiazem ER 180mg  daily  Your physician has requested that you regularly monitor and record your blood pressure readings at home. Please use the same machine at the same time of day to check your readings and record them to bring to your follow-up visit.  Your physician recommends that you schedule a follow-up appointment in: 3 months with Dr. Daleen Squibb

## 2012-07-02 ENCOUNTER — Telehealth: Payer: Self-pay

## 2012-07-02 NOTE — Telephone Encounter (Signed)
Left a detailed message and asked pt to call me back if she needs [place] to get her set up with a CPAP and if not she didn't need to return my call.

## 2012-07-02 NOTE — Telephone Encounter (Signed)
Message copied by Court Joy on Thu Jul 02, 2012  8:43 AM ------      Message from: Danise Edge A      Created: Wed Jul 01, 2012  5:10 PM       Please check with patient and make sure she is set up with her CPAP machine, I saw her sleep study and CPAP titration yesterday and she needs her CPAP started

## 2012-07-03 ENCOUNTER — Telehealth: Payer: Self-pay

## 2012-07-03 NOTE — Telephone Encounter (Signed)
Pt would like a referral though [facility] to get her CPAP. Please advise?

## 2012-07-03 NOTE — Telephone Encounter (Signed)
OK  But I am not sure what [facility] stands for is this [facility]

## 2012-07-06 NOTE — Telephone Encounter (Signed)
Sorry I looked into this and its [facility]) This is where the patient is getting her oxygen.

## 2012-07-06 NOTE — Telephone Encounter (Signed)
This is on your desk

## 2012-07-06 NOTE — Telephone Encounter (Signed)
OK. Please print her results for me again and I will place the order

## 2012-07-07 ENCOUNTER — Other Ambulatory Visit: Payer: Self-pay | Admitting: Family Medicine

## 2012-07-07 ENCOUNTER — Telehealth: Payer: Self-pay | Admitting: Family Medicine

## 2012-07-07 DIAGNOSIS — G4730 Sleep apnea, unspecified: Secondary | ICD-10-CM

## 2012-07-07 NOTE — Telephone Encounter (Signed)
done

## 2012-07-07 NOTE — Telephone Encounter (Signed)
Caller: Gina Reilly/Patient; Phone: [phone]; Reason for Call: Patient request to speak with [facility] regarding C-pap.  Wants to know if she is having trouble getting the C-pap ordered.  Please call her back.  Thanks

## 2012-07-07 NOTE — Telephone Encounter (Signed)
I tried to call pt but phone just rings.   Dr Abner Greenspan has this referral been done?

## 2012-07-07 NOTE — Progress Notes (Signed)
Referral has been sent to [facility]. Patient has been informed

## 2012-07-13 ENCOUNTER — Ambulatory Visit: Payer: Medicare Other | Admitting: Cardiology

## 2012-07-17 ENCOUNTER — Encounter: Payer: Self-pay | Admitting: Family Medicine

## 2012-07-17 ENCOUNTER — Ambulatory Visit (INDEPENDENT_AMBULATORY_CARE_PROVIDER_SITE_OTHER): Payer: Medicare Other | Admitting: Family Medicine

## 2012-07-17 VITALS — BP 142/80 | HR 61 | Temp 97.9°F | Resp 16 | Ht 68.0 in | Wt 213.5 lb

## 2012-07-17 DIAGNOSIS — I4891 Unspecified atrial fibrillation: Secondary | ICD-10-CM

## 2012-07-17 DIAGNOSIS — R5383 Other fatigue: Secondary | ICD-10-CM | POA: Diagnosis not present

## 2012-07-17 DIAGNOSIS — G4730 Sleep apnea, unspecified: Secondary | ICD-10-CM

## 2012-07-17 DIAGNOSIS — R5381 Other malaise: Secondary | ICD-10-CM | POA: Diagnosis not present

## 2012-07-17 DIAGNOSIS — R351 Nocturia: Secondary | ICD-10-CM

## 2012-07-17 DIAGNOSIS — I10 Essential (primary) hypertension: Secondary | ICD-10-CM | POA: Diagnosis not present

## 2012-07-17 LAB — POCT URINALYSIS DIPSTICK
Blood, UA: NEGATIVE
Protein, UA: NEGATIVE
Spec Grav, UA: 1.02
Urobilinogen, UA: 0.2

## 2012-07-17 NOTE — Patient Instructions (Addendum)
Constipation in Adults Constipation is having fewer than 2 bowel movements per week. Usually, the stools are hard. As we grow older, constipation is more common. If you try to fix constipation with laxatives, the problem may get worse. This is because laxatives taken over a long period of time make the colon muscles weaker. A low-fiber diet, not taking in enough fluids, and taking some medicines may make these problems worse. MEDICATIONS THAT MAY CAUSE CONSTIPATION  Water pills (diuretics).   Calcium channel blockers (used to control blood pressure and for the heart).   Certain pain medicines (narcotics).   Anticholinergics.   Anti-inflammatory agents.   Antacids that contain aluminum.  DISEASES THAT CONTRIBUTE TO CONSTIPATION  Diabetes.   Parkinson's disease.   Dementia.   Stroke.   Depression.   Illnesses that cause problems with salt and water metabolism.  HOME CARE INSTRUCTIONS   Constipation is usually best cared for without medicines. Increasing dietary fiber and eating more fruits and vegetables is the best way to manage constipation.   Slowly increase fiber intake to 25 to 38 grams per day. Whole grains, fruits, vegetables, and legumes are good sources of fiber. A dietitian can further help you incorporate high-fiber foods into your diet.   Drink enough water and fluids to keep your urine clear or pale yellow.   A fiber supplement may be added to your diet if you cannot get enough fiber from foods.   Increasing your activities also helps improve regularity.   Suppositories, as suggested by your caregiver, will also help. If you are using antacids, such as aluminum or calcium containing products, it will be helpful to switch to products containing magnesium if your caregiver says it is okay.   If you have been given a liquid injection (enema) today, this is only a temporary measure. It should not be relied on for treatment of longstanding (chronic) constipation.    Stronger measures, such as magnesium sulfate, should be avoided if possible. This may cause uncontrollable diarrhea. Using magnesium sulfate may not allow you time to make it to the bathroom.  SEEK IMMEDIATE MEDICAL CARE IF:   There is bright red blood in the stool.   The constipation stays for more than 4 days.   There is belly (abdominal) or rectal pain.   You do not seem to be getting better.   You have any questions or concerns.  MAKE SURE YOU:   Understand these instructions.   Will watch your condition.   Will get help right away if you are not doing well or get worse.  Document Released: 07/12/2004 Document Revised: 10/03/2011 Document Reviewed: 09/17/2011 [facility] Patient Information 2012 [place], [place]  Try Pericolace/Senna S/Docusate Senna for over night relief of constipation  Or try Milk of Magnesium 2 tbls as needed

## 2012-07-19 NOTE — Progress Notes (Signed)
Patient ID: Gina Reilly, female   DOB: 11-Feb-1924, 78 y.o.   MRN: [id number] Gina Reilly [id number] Jul 18, 1924 07/19/2012      Progress Note-Follow Up  Subjective  Chief Complaint  Chief Complaint  Patient presents with  . Follow-up    4-wk. [OSA; HTN; AFIB][  . Medication Management    Discuss medication for diarrhea intermittently.    HPI  Patient is an 78 we'll Caucasian female who is in today for followup and multiple conditions. She is now using her CPAP machine routinely and does believe it is helping. She feels less tired and overall feels improved. She still has peripheral neuropathy and a feeling of heaviness in her feet but otherwise offers no acute complaints. No chest pain, palpitations, shortness of breath, GI or GU complaints at this time.  Past Medical History  Diagnosis Date  . Anemia   . GERD (gastroesophageal reflux disease)   . Thyroid disease     hypothroidism  . Arthritis     osteo  . Hyperlipidemia   . Vitamin d deficiency   . Chicken pox as a child  . Mumps as a child  . Measles as a child  . Hypertension   . Skin cancer     left temple  . Obese   . Ovarian cyst   . Pelvic mass in female   . Bronchitis 12/25/2011  . Contusion of leg, left 01/16/2012  . Peripheral neuropathy 02/26/2012  . Hyperglycemia 01/10/2010    Qualifier: Diagnosis of  By: Alphonzo Severance MD, Loni Dolly   . Atrial fibrillation     Status post TEE guided DCCV 07/2010:  Transesophageal Echocardiogram 07/2010: EF 60-65% mild MR.;  Amiodarone;  Pradaxa  . Hypoxia 04/14/2012  . Apnea, sleep 04/24/2012  . Hyperthyroidism 02/09/2009    Qualifier: Diagnosis of  By: Alphonzo Severance MD, Loni Dolly   . Intermittent claudication 05/22/2012    Past Surgical History  Procedure Date  . Cataract extraction     Dr. Burgess Estelle  . Total hip arthroplasty 2003    left  . Abdominal hysterectomy   . Skin biopsy     left temple. cancerous    Family History  Problem Relation Age of Onset  . Stroke Mother     . Cancer Father     prostate  . Hypertension Brother   . Cancer Maternal Grandmother   . Hypertension Brother   . Heart disease Brother   . Obesity Brother   . Hypertension Brother   . Hyperlipidemia Brother   . Heart disease Brother   . Kidney disease Brother   . Cancer Daughter     kidney- spine and blood stream  . Heart disease Son     valvular heart disease    History   Social History  . Marital Status: Widowed    Spouse Name: N/A    Number of Children: 9  . Years of Education: N/A   Occupational History  .     Social History Main Topics  . Smoking status: Former Smoker -- 0.3 packs/day for 5 years    Types: Cigarettes    Quit date: 10/28/1973  . Smokeless tobacco: Never Used  . Alcohol Use: Yes     socially  . Drug Use: No  . Sexually Active: Not on file   Other Topics Concern  . Not on file   Social History Narrative  . No narrative on file    Current Outpatient Prescriptions on File Prior to  Visit  Medication Sig Dispense Refill  . acetaminophen (TYLENOL 8 HOUR) 650 MG CR tablet Take 650 mg by mouth at bedtime.       Marland Kitchen diltiazem (DILACOR XR) 180 MG 24 hr capsule Take 1 capsule (180 mg total) by mouth daily.  30 capsule  11  . furosemide (LASIX) 20 MG tablet Take 1 tablet (20 mg total) by mouth daily.  30 tablet  6  . losartan (COZAAR) 100 MG tablet Take 1 tablet (100 mg total) by mouth daily.  30 tablet  11  . PRADAXA 150 MG CAPS TAKE ONE CAPSULE TWICE A DAY  60 capsule  5  . Probiotic Product (PROBIOTIC DAILY PO) Take by mouth daily.        Allergies  Allergen Reactions  . Amiodarone Other (See Comments)    Affecting the Lungs  . Aspercreme (Trolamine Salicylate)     Review of Systems  Review of Systems  Constitutional: Negative for fever and malaise/fatigue.  HENT: Negative for congestion.   Eyes: Negative for discharge.  Respiratory: Negative for shortness of breath.   Cardiovascular: Negative for chest pain, palpitations and leg  swelling.  Gastrointestinal: Negative for nausea, abdominal pain and diarrhea.  Genitourinary: Negative for dysuria.  Musculoskeletal: Negative for falls.  Skin: Negative for rash.  Neurological: Negative for loss of consciousness and headaches.  Endo/Heme/Allergies: Negative for polydipsia.  Psychiatric/Behavioral: Negative for depression and suicidal ideas. The patient is not nervous/anxious and does not have insomnia.     Objective  BP 142/80  Pulse 61  Temp 97.9 F (36.6 C) (Oral)  Resp 16  Ht 5\' 8"  (1.727 m)  Wt 213 lb 8 oz (96.843 kg)  BMI 32.46 kg/m2  SpO2 98%  LMP 12/25/2011  Physical Exam  Physical Exam  Constitutional: She is oriented to person, place, and time and well-developed, well-nourished, and in no distress. No distress.  HENT:  [place]: Normocephalic and atraumatic.  Eyes: Conjunctivae normal are normal.  Neck: Neck supple. No thyromegaly present.  Cardiovascular: Normal rate, regular rhythm and normal heart sounds.   Pulmonary/Chest: Effort normal and breath sounds normal. She has no wheezes.  Abdominal: She exhibits no distension and no mass.  Musculoskeletal: She exhibits no edema.  Lymphadenopathy:    She has no cervical adenopathy.  Neurological: She is alert and oriented to person, place, and time.  Skin: Skin is warm and dry. No rash noted. She is not diaphoretic.  Psychiatric: Memory, affect and judgment normal.    Lab Results  Component Value Date   TSH 2.80 04/13/2012   Lab Results  Component Value Date   WBC 8.5 04/13/2012   HGB 12.4 04/13/2012   HCT 37.7 04/13/2012   MCV 93.0 04/13/2012   PLT 366.0 04/13/2012   Lab Results  Component Value Date   CREATININE 0.9 04/21/2012   BUN 16 04/21/2012   NA 137 04/21/2012   K 4.1 04/21/2012   CL 100 04/21/2012   CO2 28 04/21/2012   Lab Results  Component Value Date   ALT 25 01/08/2012   AST 29 01/08/2012   ALKPHOS 64 01/08/2012   BILITOT 0.4 01/08/2012   Lab Results  Component Value Date   CHOL  161 01/08/2012   Lab Results  Component Value Date   HDL 59.70 01/08/2012   Lab Results  Component Value Date   LDLCALC 86 01/08/2012   Lab Results  Component Value Date   TRIG 77.0 01/08/2012   Lab Results  Component Value Date  CHOLHDL 3 01/08/2012     Assessment & Plan  Apnea, sleep Is now using her CPAP machine routinely and she does feel she is doing better. Continue the same  Hypertension Improved control no changes to meds today  FATIGUE Improving with treatment of sleep apnea  ATRIAL FIBRILLATION Rate controlled, tolerating Pradaxa well

## 2012-07-19 NOTE — Assessment & Plan Note (Signed)
Rate controlled, tolerating Pradaxa well

## 2012-07-19 NOTE — Assessment & Plan Note (Signed)
Improving with treatment of sleep apnea

## 2012-07-19 NOTE — Assessment & Plan Note (Signed)
Improved control no changes to meds today

## 2012-07-19 NOTE — Assessment & Plan Note (Signed)
Is now using her CPAP machine routinely and she does feel she is doing better. Continue the same

## 2012-07-20 NOTE — Progress Notes (Signed)
Quick Note:  Patient Informed and voiced understanding ______ 

## 2012-07-30 ENCOUNTER — Other Ambulatory Visit: Payer: Self-pay | Admitting: Cardiology

## 2012-08-03 ENCOUNTER — Other Ambulatory Visit: Payer: Self-pay | Admitting: Cardiology

## 2012-08-06 ENCOUNTER — Other Ambulatory Visit: Payer: Self-pay

## 2012-08-06 MED ORDER — DABIGATRAN ETEXILATE MESYLATE 150 MG PO CAPS: 150.0000 mg | ORAL_CAPSULE | Freq: Two times a day (BID) | ORAL | Status: DC

## 2012-09-09 ENCOUNTER — Other Ambulatory Visit (INDEPENDENT_AMBULATORY_CARE_PROVIDER_SITE_OTHER): Payer: Medicare Other

## 2012-09-09 DIAGNOSIS — I10 Essential (primary) hypertension: Secondary | ICD-10-CM

## 2012-09-09 LAB — HEPATIC FUNCTION PANEL
ALT: 20 U/L (ref 0–35)
Total Protein: 7.1 g/dL (ref 6.0–8.3)

## 2012-09-09 LAB — RENAL FUNCTION PANEL
Glucose, Bld: 157 mg/dL — ABNORMAL HIGH (ref 70–99)
Phosphorus: 3.7 mg/dL (ref 2.3–4.6)
Potassium: 4.4 mEq/L (ref 3.5–5.1)
Sodium: 135 mEq/L (ref 135–145)

## 2012-09-09 LAB — CBC
MCV: 95.3 fl (ref 78.0–100.0)
RBC: 4.1 Mil/uL (ref 3.87–5.11)
WBC: 7.6 10*3/uL (ref 4.5–10.5)

## 2012-09-10 ENCOUNTER — Telehealth: Payer: Self-pay

## 2012-09-10 NOTE — Telephone Encounter (Signed)
A1C added

## 2012-09-15 DIAGNOSIS — Z85828 Personal history of other malignant neoplasm of skin: Secondary | ICD-10-CM | POA: Diagnosis not present

## 2012-09-15 DIAGNOSIS — D485 Neoplasm of uncertain behavior of skin: Secondary | ICD-10-CM | POA: Diagnosis not present

## 2012-09-16 ENCOUNTER — Encounter: Payer: Self-pay | Admitting: Family Medicine

## 2012-09-16 ENCOUNTER — Ambulatory Visit (INDEPENDENT_AMBULATORY_CARE_PROVIDER_SITE_OTHER): Payer: Medicare Other | Admitting: Family Medicine

## 2012-09-16 VITALS — BP 144/78 | HR 55 | Temp 97.6°F | Ht 68.0 in | Wt 233.8 lb

## 2012-09-16 DIAGNOSIS — R0902 Hypoxemia: Secondary | ICD-10-CM | POA: Diagnosis not present

## 2012-09-16 DIAGNOSIS — R7309 Other abnormal glucose: Secondary | ICD-10-CM

## 2012-09-16 DIAGNOSIS — R739 Hyperglycemia, unspecified: Secondary | ICD-10-CM

## 2012-09-16 DIAGNOSIS — C4490 Unspecified malignant neoplasm of skin, unspecified: Secondary | ICD-10-CM

## 2012-09-16 DIAGNOSIS — M1990 Unspecified osteoarthritis, unspecified site: Secondary | ICD-10-CM

## 2012-09-16 DIAGNOSIS — E785 Hyperlipidemia, unspecified: Secondary | ICD-10-CM

## 2012-09-16 DIAGNOSIS — I10 Essential (primary) hypertension: Secondary | ICD-10-CM | POA: Diagnosis not present

## 2012-09-16 DIAGNOSIS — G4730 Sleep apnea, unspecified: Secondary | ICD-10-CM

## 2012-09-16 DIAGNOSIS — R5381 Other malaise: Secondary | ICD-10-CM

## 2012-09-16 DIAGNOSIS — R5383 Other fatigue: Secondary | ICD-10-CM

## 2012-09-16 NOTE — Assessment & Plan Note (Signed)
Uses tylenol for pain prn, encouraged to use no more than 3000mg  in 24 hours for pain.

## 2012-09-16 NOTE — Progress Notes (Signed)
Patient ID: Gina Reilly, female   DOB: 1924-01-11, 78 y.o.   MRN: [id number] Gina Reilly [id number] 1924-01-05 09/16/2012      Progress Note-Follow Up  Subjective  Chief Complaint  Chief Complaint  Patient presents with  . Follow-up    2 month    HPI  Patient is an 78 we'll Caucasian female who is in today for followup. She is doing significantly better. She brings in blood pressure log which shows that her systolic blood pressures been in a 140 to 150s at home and her diastolics are running from the high 50s to 80s. She says overall she feels greatly improved. Since using CPAP she feels less fatigued and her mood and thinking is better. No recent illness or fevers. Her bowels are moving better with the addition of Metamucil and increased fruits and vegetables in her diet. She had a recent drive to [place] and notes she did not take her Lasix at that time her edema was significant. Since getting home she is started using the Lasix again and moving more and her edema is resolved once more. She is frustrated regarding hair loss since starting on diltiazem and losartan but she otherwise feels she tolerates the medications well. No chest pain, palpitations, shortness of breath, GI or GU complaints noted. She is now only using her oxygen when necessary when she exerts herself to go to the store or has to do significant activity at home.  Past Medical History  Diagnosis Date  . Anemia   . GERD (gastroesophageal reflux disease)   . Thyroid disease     hypothroidism  . Arthritis     osteo  . Hyperlipidemia   . Vitamin D deficiency   . Chicken pox as a child  . Mumps as a child  . Measles as a child  . Hypertension   . Skin cancer     left temple  . Obese   . Ovarian cyst   . Pelvic mass in female   . Bronchitis 12/25/2011  . Contusion of leg, left 01/16/2012  . Peripheral neuropathy 02/26/2012  . Hyperglycemia 01/10/2010    Qualifier: Diagnosis of  By: Alphonzo Severance MD, Loni Dolly   .  Atrial fibrillation     Status post TEE guided DCCV 07/2010:  Transesophageal Echocardiogram 07/2010: EF 60-65% mild MR.;  Amiodarone;  Pradaxa  . Hypoxia 04/14/2012  . Apnea, sleep 04/24/2012  . Hyperthyroidism 02/09/2009    Qualifier: Diagnosis of  By: Alphonzo Severance MD, Loni Dolly   . Intermittent claudication 05/22/2012    Past Surgical History  Procedure Date  . Cataract extraction     Dr. Burgess Estelle  . Total hip arthroplasty 2003    left  . Abdominal hysterectomy   . Skin biopsy     left temple. cancerous    Family History  Problem Relation Age of Onset  . Stroke Mother   . Cancer Father     prostate  . Hypertension Brother   . Cancer Maternal Grandmother   . Hypertension Brother   . Heart disease Brother   . Obesity Brother   . Hypertension Brother   . Hyperlipidemia Brother   . Heart disease Brother   . Kidney disease Brother   . Cancer Daughter     kidney- spine and blood stream  . Heart disease Son     valvular heart disease    History   Social History  . Marital Status: Widowed    Spouse Name: N/A  Number of Children: 9  . Years of Education: N/A   Occupational History  .     Social History Main Topics  . Smoking status: Former Smoker -- 0.3 packs/day for 5 years    Types: Cigarettes    Quit date: 10/28/1973  . Smokeless tobacco: Never Used  . Alcohol Use: Yes     Comment: socially  . Drug Use: No  . Sexually Active: Not on file   Other Topics Concern  . Not on file   Social History Narrative  . No narrative on file    Current Outpatient Prescriptions on File Prior to Visit  Medication Sig Dispense Refill  . acetaminophen (TYLENOL 8 HOUR) 650 MG CR tablet Take 650 mg by mouth at bedtime.       . dabigatran (PRADAXA) 150 MG CAPS Take 1 capsule (150 mg total) by mouth 2 (two) times daily.  60 capsule  5  . diltiazem (DILACOR XR) 180 MG 24 hr capsule Take 1 capsule (180 mg total) by mouth daily.  30 capsule  11  . furosemide (LASIX) 20 MG tablet  Take 1 tablet (20 mg total) by mouth daily.  30 tablet  6  . losartan (COZAAR) 100 MG tablet TAKE 1 TABLET (100 MG TOTAL) BY MOUTH DAILY.  30 tablet  11    Allergies  Allergen Reactions  . Amiodarone Other (See Comments)    Affecting the Lungs  . Aspercreme (Trolamine Salicylate)     Review of Systems  Review of Systems  Constitutional: Negative for fever and malaise/fatigue.  HENT: Negative for congestion.   Eyes: Negative for discharge.  Respiratory: Negative for shortness of breath.   Cardiovascular: Positive for leg swelling. Negative for chest pain and palpitations.  Gastrointestinal: Negative for nausea, abdominal pain and diarrhea.  Genitourinary: Negative for dysuria.  Musculoskeletal: Negative for falls.  Skin: Negative for rash.  Neurological: Negative for loss of consciousness and headaches.  Endo/Heme/Allergies: Negative for polydipsia.  Psychiatric/Behavioral: Negative for depression and suicidal ideas. The patient is not nervous/anxious and does not have insomnia.     Objective  BP 144/78  Pulse 55  Temp 97.6 F (36.4 C) (Temporal)  Ht 5\' 8"  (1.727 m)  Wt 233 lb 12.8 oz (106.051 kg)  BMI 35.55 kg/m2  SpO2 98%  LMP 12/25/2011  Physical Exam  Physical Exam  Constitutional: She is oriented to person, place, and time and well-developed, well-nourished, and in no distress. No distress.  HENT:  [place]: Normocephalic and atraumatic.  Eyes: Conjunctivae normal are normal.  Neck: Neck supple. No thyromegaly present.  Cardiovascular: Regular rhythm and normal heart sounds.   No murmur heard.      bradycardia  Pulmonary/Chest: Effort normal and breath sounds normal. She has no wheezes.  Abdominal: She exhibits no distension and no mass.  Musculoskeletal: She exhibits no edema.  Lymphadenopathy:    She has no cervical adenopathy.  Neurological: She is alert and oriented to person, place, and time.  Skin: Skin is warm and dry. No rash noted. She is not  diaphoretic.  Psychiatric: Memory, affect and judgment normal.    Lab Results  Component Value Date   TSH 2.80 04/13/2012   Lab Results  Component Value Date   WBC 7.6 09/09/2012   HGB 12.8 09/09/2012   HCT 39.1 09/09/2012   MCV 95.3 09/09/2012   PLT 264.0 09/09/2012   Lab Results  Component Value Date   CREATININE 1.1 09/09/2012   BUN 22 09/09/2012   NA  135 09/09/2012   K 4.4 09/09/2012   CL 102 09/09/2012   CO2 24 09/09/2012   Lab Results  Component Value Date   ALT 20 09/09/2012   AST 27 09/09/2012   ALKPHOS 74 09/09/2012   BILITOT 0.7 09/09/2012   Lab Results  Component Value Date   CHOL 161 01/08/2012   Lab Results  Component Value Date   HDL 59.70 01/08/2012   Lab Results  Component Value Date   LDLCALC 86 01/08/2012   Lab Results  Component Value Date   TRIG 77.0 01/08/2012   Lab Results  Component Value Date   CHOLHDL 3 01/08/2012     Assessment & Plan  Hypoxia Doing well, using O2 prn, is not wearing it in office today and is 89% after walking in.  Continue same  Hypertension Improved on repeat no change in meds. Minimize sodium  HYPERLIPIDEMIA Check lipids prior to next visit.  Hyperglycemia hgba1c 5.8 minimize simple carbs  FATIGUE Improved with CPAP use  Apnea, sleep Using CPAP machine routinely and feeling well. No changes  Skin cancer Resolved with recent labs.   OSTEOARTHRITIS Uses tylenol for pain prn, encouraged to use no more than 3000mg  in 24 hours for pain.

## 2012-09-16 NOTE — Assessment & Plan Note (Signed)
hgba1c 5.8 minimize simple carbs

## 2012-09-16 NOTE — Assessment & Plan Note (Addendum)
Doing well, using O2 prn, is not wearing it in office today and is 89% after walking in.  Continue same

## 2012-09-16 NOTE — Patient Instructions (Addendum)
Try compression hose, knee highs with mild compression hose on in am off in pm, try the 15-31mmhg by Jobst For the hair try MegaRed krill oil caps, Biotin daily and a multivitamin with minerals or else just plain Zinc 50 mg daily

## 2012-09-16 NOTE — Assessment & Plan Note (Signed)
Using CPAP machine routinely and feeling well. No changes

## 2012-09-16 NOTE — Assessment & Plan Note (Signed)
Resolved with recent labs. 

## 2012-09-16 NOTE — Assessment & Plan Note (Signed)
Improved with CPAP use

## 2012-09-16 NOTE — Assessment & Plan Note (Signed)
Check lipids prior to next visit  ?

## 2012-09-16 NOTE — Assessment & Plan Note (Signed)
Improved on repeat no change in meds. Minimize sodium

## 2012-09-30 ENCOUNTER — Encounter: Payer: Self-pay | Admitting: Cardiology

## 2012-09-30 ENCOUNTER — Ambulatory Visit (INDEPENDENT_AMBULATORY_CARE_PROVIDER_SITE_OTHER): Payer: Medicare Other | Admitting: Cardiology

## 2012-09-30 VITALS — BP 144/72 | Ht 68.0 in | Wt 232.0 lb

## 2012-09-30 DIAGNOSIS — J984 Other disorders of lung: Secondary | ICD-10-CM

## 2012-09-30 DIAGNOSIS — T466X5A Adverse effect of antihyperlipidemic and antiarteriosclerotic drugs, initial encounter: Secondary | ICD-10-CM | POA: Diagnosis not present

## 2012-09-30 DIAGNOSIS — I4891 Unspecified atrial fibrillation: Secondary | ICD-10-CM

## 2012-09-30 DIAGNOSIS — J708 Respiratory conditions due to other specified external agents: Secondary | ICD-10-CM

## 2012-09-30 DIAGNOSIS — I10 Essential (primary) hypertension: Secondary | ICD-10-CM

## 2012-09-30 NOTE — Patient Instructions (Addendum)
Your physician recommends that you continue on your current medications as directed. Please refer to the Current Medication list given to you today.   Your physician wants you to follow-up in: 1 year with Dr. Wall. You will receive a reminder letter in the mail two months in advance. If you don't receive a letter, please call our office to schedule the follow-up appointment.  

## 2012-09-30 NOTE — Progress Notes (Signed)
HPI Gina Reilly returns today for evaluation and management of her history of atrial fibrillation. She feels much better now that amiodarone is cleared with improved oxygenation and improvement in her shortness of breath. Her blood pressures also under better control as noted by Dr. Rogelia Rohrer. Heart rate is much better today than it was on her last visit.   She denies any falls. She's compliant with her medications.  Past Medical History  Diagnosis Date  . Anemia   . GERD (gastroesophageal reflux disease)   . Thyroid disease     hypothroidism  . Arthritis     osteo  . Hyperlipidemia   . Vitamin D deficiency   . Chicken pox as a child  . Mumps as a child  . Measles as a child  . Hypertension   . Skin cancer     left temple  . Obese   . Ovarian cyst   . Pelvic mass in female   . Bronchitis 12/25/2011  . Contusion of leg, left 01/16/2012  . Peripheral neuropathy 02/26/2012  . Hyperglycemia 01/10/2010    Qualifier: Diagnosis of  By: Alphonzo Severance MD, Loni Dolly   . Atrial fibrillation     Status post TEE guided DCCV 07/2010:  Transesophageal Echocardiogram 07/2010: EF 60-65% mild MR.;  Amiodarone;  Pradaxa  . Hypoxia 04/14/2012  . Apnea, sleep 04/24/2012  . Hyperthyroidism 02/09/2009    Qualifier: Diagnosis of  By: Alphonzo Severance MD, Loni Dolly   . Intermittent claudication 05/22/2012    Current Outpatient Prescriptions  Medication Sig Dispense Refill  . acetaminophen (TYLENOL 8 HOUR) 650 MG CR tablet Take 650 mg by mouth daily. Takes no more then 3000mg  daily      . dabigatran (PRADAXA) 150 MG CAPS Take 1 capsule (150 mg total) by mouth 2 (two) times daily.  60 capsule  5  . diltiazem (DILACOR XR) 180 MG 24 hr capsule Take 1 capsule (180 mg total) by mouth daily.  30 capsule  11  . losartan (COZAAR) 100 MG tablet TAKE 1 TABLET (100 MG TOTAL) BY MOUTH DAILY.  30 tablet  11  . Multiple Vitamin (MULTIVITAMIN) tablet Take 1 tablet by mouth daily.        Allergies  Allergen Reactions  . Amiodarone  Other (See Comments)    Affecting the Lungs  . Aspercreme (Trolamine Salicylate)     Family History  Problem Relation Age of Onset  . Stroke Mother   . Cancer Father     prostate  . Hypertension Brother   . Cancer Maternal Grandmother   . Hypertension Brother   . Heart disease Brother   . Obesity Brother   . Hypertension Brother   . Hyperlipidemia Brother   . Heart disease Brother   . Kidney disease Brother   . Cancer Daughter     kidney- spine and blood stream  . Heart disease Son     valvular heart disease    History   Social History  . Marital Status: Widowed    Spouse Name: N/A    Number of Children: 9  . Years of Education: N/A   Occupational History  .     Social History Main Topics  . Smoking status: Former Smoker -- 0.3 packs/day for 5 years    Types: Cigarettes    Quit date: 10/28/1973  . Smokeless tobacco: Never Used  . Alcohol Use: Yes     Comment: socially  . Drug Use: No  . Sexually Active: Not on  file   Other Topics Concern  . Not on file   Social History Narrative  . No narrative on file    ROS ALL NEGATIVE EXCEPT THOSE NOTED IN HPI  PE  General Appearance: well developed, well nourished in no acute distress, looks much better overall. HEENT: symmetrical face, PERRLA, good dentition  Neck: no JVD, thyromegaly, or adenopathy, trachea midline Chest: symmetric without deformity Cardiac: PMI non-displaced, irregular rate and rhythm but well controlled normal S1, S2, no gallop or murmur Lung: clear to ausculation and percussion Vascular: all pulses full without bruits  Abdominal: nondistended, nontender, good bowel sounds, no HSM, no bruits Extremities: no cyanosis, clubbing or edema, no sign of DVT, no varicosities  Skin: normal color, no rashes Neuro: alert and oriented x 3, non-focal Pysch: normal affect  EKG  BMET    Component Value Date/Time   NA 135 09/09/2012 1328   K 4.4 09/09/2012 1328   CL 102 09/09/2012 1328   CO2 24  09/09/2012 1328   GLUCOSE 157* 09/09/2012 1328   BUN 22 09/09/2012 1328   CREATININE 1.1 09/09/2012 1328   CALCIUM 8.9 09/09/2012 1328   GFRNONAA 50* 08/14/2010 1845   GFRAA  Value: >60        The eGFR has been calculated using the MDRD equation. This calculation has not been validated in all clinical situations. eGFR's persistently <60 mL/min signify possible Chronic Kidney Disease. 08/14/2010 1845    Lipid Panel     Component Value Date/Time   CHOL 161 01/08/2012 0925   TRIG 77.0 01/08/2012 0925   HDL 59.70 01/08/2012 0925   CHOLHDL 3 01/08/2012 0925   VLDL 15.4 01/08/2012 0925   LDLCALC 86 01/08/2012 0925    CBC    Component Value Date/Time   WBC 7.6 09/09/2012 1328   RBC 4.10 09/09/2012 1328   HGB 12.8 09/09/2012 1328   HCT 39.1 09/09/2012 1328   PLT 264.0 09/09/2012 1328   MCV 95.3 09/09/2012 1328   MCH 31.3 08/17/2010 0351   MCHC 32.7 09/09/2012 1328   RDW 13.9 09/09/2012 1328   LYMPHSABS 1.1 04/13/2012 1135   MONOABS 0.7 04/13/2012 1135   EOSABS 0.1 04/13/2012 1135   BASOSABS 0.1 04/13/2012 1135

## 2012-09-30 NOTE — Assessment & Plan Note (Signed)
Improved

## 2012-09-30 NOTE — Assessment & Plan Note (Signed)
Stable. Continue current medical therapy. 

## 2012-09-30 NOTE — Assessment & Plan Note (Signed)
Improved. No change in current medical therapy.

## 2012-12-08 DIAGNOSIS — R946 Abnormal results of thyroid function studies: Secondary | ICD-10-CM | POA: Diagnosis not present

## 2012-12-21 ENCOUNTER — Telehealth: Payer: Self-pay

## 2012-12-21 NOTE — Telephone Encounter (Signed)
I left a message with Gina Reilly at [facility] to have someone return my call to how we can obtain the information through patients cpap?

## 2012-12-25 ENCOUNTER — Encounter: Payer: Self-pay | Admitting: Family Medicine

## 2012-12-25 ENCOUNTER — Ambulatory Visit (INDEPENDENT_AMBULATORY_CARE_PROVIDER_SITE_OTHER): Payer: Medicare Other | Admitting: Family Medicine

## 2012-12-25 VITALS — BP 170/82 | HR 53 | Temp 98.3°F | Ht 68.0 in | Wt 234.1 lb

## 2012-12-25 DIAGNOSIS — R319 Hematuria, unspecified: Secondary | ICD-10-CM | POA: Diagnosis not present

## 2012-12-25 DIAGNOSIS — R7309 Other abnormal glucose: Secondary | ICD-10-CM

## 2012-12-25 DIAGNOSIS — G4730 Sleep apnea, unspecified: Secondary | ICD-10-CM

## 2012-12-25 DIAGNOSIS — I10 Essential (primary) hypertension: Secondary | ICD-10-CM | POA: Diagnosis not present

## 2012-12-25 DIAGNOSIS — R350 Frequency of micturition: Secondary | ICD-10-CM

## 2012-12-25 DIAGNOSIS — R609 Edema, unspecified: Secondary | ICD-10-CM | POA: Diagnosis not present

## 2012-12-25 DIAGNOSIS — I4891 Unspecified atrial fibrillation: Secondary | ICD-10-CM

## 2012-12-25 LAB — POCT URINALYSIS DIPSTICK
Bilirubin, UA: NEGATIVE
Glucose, UA: NEGATIVE
Ketones, UA: NEGATIVE
Leukocytes, UA: NEGATIVE
pH, UA: 5.5

## 2012-12-25 MED ORDER — FUROSEMIDE 20 MG PO TABS: 20.0000 mg | ORAL_TABLET | Freq: Every day | ORAL | Status: DC | PRN

## 2012-12-25 NOTE — Assessment & Plan Note (Signed)
Repeat lipid panel with next blood draw.

## 2012-12-25 NOTE — Assessment & Plan Note (Signed)
Improved on recheck. No changes to meds today

## 2012-12-25 NOTE — Assessment & Plan Note (Signed)
Rate controlled, tolerating Pradaxa 

## 2012-12-25 NOTE — Assessment & Plan Note (Signed)
She is using her CPAP nightly although she still gets up 2-3 x a night to urinate. Encouraged to use CPAP nightly and agrees. Reports she does feel better on it. Her oxygen comes off it frequently. Will see [facility] can adjust her equipment

## 2012-12-25 NOTE — Addendum Note (Signed)
Addended by: Baldemar Lenis R on: 12/25/2012 04:30 PM   Modules accepted: Orders

## 2012-12-25 NOTE — Assessment & Plan Note (Signed)
Repeat hgba1c with next visit, no concerning symptoms

## 2012-12-25 NOTE — Patient Instructions (Addendum)
Sleep Apnea  Sleep apnea is a sleep disorder characterized by abnormal pauses in breathing while you sleep. When your breathing pauses, the level of oxygen in your blood decreases. This causes you to move out of deep sleep and into light sleep. As a result, your quality of sleep is poor, and the system that carries your blood throughout your body (cardiovascular system) experiences stress. If sleep apnea remains untreated, the following conditions can develop:  High blood pressure (hypertension).  Coronary artery disease.  Inability to achieve or maintain an erection (impotence).  Impairment of your thought process (cognitive dysfunction). There are three types of sleep apnea: 1. Obstructive sleep apnea Pauses in breathing during sleep because of a blocked airway. 2. Central sleep apnea Pauses in breathing during sleep because the area of the brain that controls your breathing does not send the correct signals to the muscles that control breathing. 3. Mixed sleep apnea A combination of both obstructive and central sleep apnea. RISK FACTORS The following risk factors can increase your risk of developing sleep apnea:  Being overweight.  Smoking.  Having narrow passages in your nose and throat.  Being of older age.  Being female.  Alcohol use.  Sedative and tranquilizer use.  Ethnicity. Among individuals younger than 35 years, African Americans are at increased risk of sleep apnea. SYMPTOMS   Difficulty staying asleep.  Daytime sleepiness and fatigue.  Loss of energy.  Irritability.  Loud, heavy snoring.  Morning headaches.  Trouble concentrating.  Forgetfulness.  Decreased interest in sex. DIAGNOSIS  In order to diagnose sleep apnea, your caregiver will perform a physical examination. Your caregiver may suggest that you take a home sleep test. Your caregiver may also recommend that you spend the night in a sleep lab. In the sleep lab, several monitors record  information about your heart, lungs, and brain while you sleep. Your leg and arm movements and blood oxygen level are also recorded. TREATMENT The following actions may help to resolve mild sleep apnea:  Sleeping on your side.   Using a decongestant if you have nasal congestion.   Avoiding the use of depressants, including alcohol, sedatives, and narcotics.   Losing weight and modifying your diet if you are overweight. There also are devices and treatments to help open your airway:  Oral appliances. These are custom-made mouthpieces that shift your lower jaw forward and slightly open your bite. This opens your airway.  Devices that create positive airway pressure. This positive pressure "splints" your airway open to help you breathe better during sleep. The following devices create positive airway pressure:  Continuous positive airway pressure (CPAP) device. The CPAP device creates a continuous level of air pressure with an air pump. The air is delivered to your airway through a mask while you sleep. This continuous pressure keeps your airway open.  Nasal expiratory positive airway pressure (EPAP) device. The EPAP device creates positive air pressure as you exhale. The device consists of single-use valves, which are inserted into each nostril and held in place by adhesive. The valves create very little resistance when you inhale but create much more resistance when you exhale. That increased resistance creates the positive airway pressure. This positive pressure while you exhale keeps your airway open, making it easier to breath when you inhale again.  Bilevel positive airway pressure (BPAP) device. The BPAP device is used mainly in patients with central sleep apnea. This device is similar to the CPAP device because it also uses an air pump to deliver   continuous air pressure through a mask. However, with the BPAP machine, the pressure is set at two different levels. The pressure when you  exhale is lower than the pressure when you inhale.  Surgery. Typically, surgery is only done if you cannot comply with less invasive treatments or if the less invasive treatments do not improve your condition. Surgery involves removing excess tissue in your airway to create a wider passage way. Document Released: 10/04/2002 Document Revised: 04/14/2012 Document Reviewed: 02/20/2012 [employer] Patient Information 2013 [employer].  

## 2012-12-25 NOTE — Assessment & Plan Note (Signed)
Check urinalysis today

## 2012-12-25 NOTE — Progress Notes (Signed)
Patient ID: Gina Reilly, female   DOB: May 15, 1924, 79 y.o.   MRN: [id number] Gina Reilly [id number] 1923/12/22 12/25/2012      Progress Note-Follow Up  Subjective  Chief Complaint  Chief Complaint  Patient presents with  . Follow-up    CPAP    HPI  Patient is a 79 year old Caucasian female is here today for followup. She is using her CPAP nightly although she does acknowledge she still gets up often 3 times a night. Struggling with some frequency but no dysuria. Does have some low back pain but this is ongoing. No fevers or chills. No chest pain, palpitations, shortness of breath, GI complaints. She does report overall she feels much better. No recent illness.  Past Medical History  Diagnosis Date  . Anemia   . GERD (gastroesophageal reflux disease)   . Thyroid disease     hypothroidism  . Arthritis     osteo  . Hyperlipidemia   . Vitamin D deficiency   . Chicken pox as a child  . Mumps as a child  . Measles as a child  . Hypertension   . Skin cancer     left temple  . Obese   . Ovarian cyst   . Pelvic mass in female   . Bronchitis 12/25/2011  . Contusion of leg, left 01/16/2012  . Peripheral neuropathy 02/26/2012  . Hyperglycemia 01/10/2010    Qualifier: Diagnosis of  By: Alphonzo Severance MD, Loni Dolly   . Atrial fibrillation     Status post TEE guided DCCV 07/2010:  Transesophageal Echocardiogram 07/2010: EF 60-65% mild MR.;  Amiodarone;  Pradaxa  . Hypoxia 04/14/2012  . Apnea, sleep 04/24/2012  . Hyperthyroidism 02/09/2009    Qualifier: Diagnosis of  By: Alphonzo Severance MD, Loni Dolly   . Intermittent claudication 05/22/2012    Past Surgical History  Procedure Laterality Date  . Cataract extraction      Dr. Burgess Estelle  . Total hip arthroplasty  2003    left  . Abdominal hysterectomy    . Skin biopsy      left temple. cancerous    Family History  Problem Relation Age of Onset  . Stroke Mother   . Cancer Father     prostate  . Hypertension Brother   . Cancer Maternal  Grandmother   . Hypertension Brother   . Heart disease Brother   . Obesity Brother   . Hypertension Brother   . Hyperlipidemia Brother   . Heart disease Brother   . Kidney disease Brother   . Cancer Daughter     kidney- spine and blood stream  . Heart disease Son     valvular heart disease    History   Social History  . Marital Status: Widowed    Spouse Name: N/A    Number of Children: 9  . Years of Education: N/A   Occupational History  .     Social History Main Topics  . Smoking status: Former Smoker -- 0.30 packs/day for 5 years    Types: Cigarettes    Quit date: 10/28/1973  . Smokeless tobacco: Never Used  . Alcohol Use: Yes     Comment: socially  . Drug Use: No  . Sexually Active: Not on file   Other Topics Concern  . Not on file   Social History Narrative  . No narrative on file    Current Outpatient Prescriptions on File Prior to Visit  Medication Sig Dispense Refill  . acetaminophen (TYLENOL  8 HOUR) 650 MG CR tablet Take 650 mg by mouth daily. Takes no more then 3000mg  daily      . dabigatran (PRADAXA) 150 MG CAPS Take 1 capsule (150 mg total) by mouth 2 (two) times daily.  60 capsule  5  . diltiazem (DILACOR XR) 180 MG 24 hr capsule Take 1 capsule (180 mg total) by mouth daily.  30 capsule  11  . losartan (COZAAR) 100 MG tablet TAKE 1 TABLET (100 MG TOTAL) BY MOUTH DAILY.  30 tablet  11   No current facility-administered medications on file prior to visit.    Allergies  Allergen Reactions  . Amiodarone Other (See Comments)    Affecting the Lungs  . Aspercreme (Trolamine Salicylate)     Review of Systems  Review of Systems  Constitutional: Negative for fever and malaise/fatigue.  HENT: Negative for congestion.   Eyes: Negative for discharge.  Respiratory: Negative for shortness of breath.   Cardiovascular: Negative for chest pain, palpitations and leg swelling.  Gastrointestinal: Negative for nausea, abdominal pain and diarrhea.   Genitourinary: Positive for frequency. Negative for dysuria, hematuria and flank pain.  Musculoskeletal: Positive for back pain. Negative for falls.  Skin: Negative for rash.  Neurological: Negative for loss of consciousness and headaches.  Endo/Heme/Allergies: Negative for polydipsia.  Psychiatric/Behavioral: Negative for depression and suicidal ideas. The patient is not nervous/anxious and does not have insomnia.     Objective  BP 170/82  Pulse 53  Temp(Src) 98.3 F (36.8 C) (Temporal)  Ht 5\' 8"  (1.727 m)  Wt 234 lb 1.9 oz (106.196 kg)  BMI 35.61 kg/m2  SpO2 96%  LMP 12/25/2011  Physical Exam  Physical Exam  Constitutional: She is oriented to person, place, and time and well-developed, well-nourished, and in no distress. No distress.  HENT:  [place]: Normocephalic and atraumatic.  Eyes: Conjunctivae are normal.  Neck: Neck supple. No thyromegaly present.  Cardiovascular:  Murmur heard. Bradycardia, irregular  Pulmonary/Chest: Effort normal and breath sounds normal. She has no wheezes.  Abdominal: She exhibits no distension and no mass.  Musculoskeletal: She exhibits no edema.  Lymphadenopathy:    She has no cervical adenopathy.  Neurological: She is alert and oriented to person, place, and time.  Skin: Skin is warm and dry. No rash noted. She is not diaphoretic.  Psychiatric: Memory, affect and judgment normal.    Lab Results  Component Value Date   TSH 2.80 04/13/2012   Lab Results  Component Value Date   WBC 7.6 09/09/2012   HGB 12.8 09/09/2012   HCT 39.1 09/09/2012   MCV 95.3 09/09/2012   PLT 264.0 09/09/2012   Lab Results  Component Value Date   CREATININE 1.1 09/09/2012   BUN 22 09/09/2012   NA 135 09/09/2012   K 4.4 09/09/2012   CL 102 09/09/2012   CO2 24 09/09/2012   Lab Results  Component Value Date   ALT 20 09/09/2012   AST 27 09/09/2012   ALKPHOS 74 09/09/2012   BILITOT 0.7 09/09/2012   Lab Results  Component Value Date   CHOL 161  01/08/2012   Lab Results  Component Value Date   HDL 59.70 01/08/2012   Lab Results  Component Value Date   LDLCALC 86 01/08/2012   Lab Results  Component Value Date   TRIG 77.0 01/08/2012   Lab Results  Component Value Date   CHOLHDL 3 01/08/2012     Assessment & Plan  ATRIAL FIBRILLATION Rate controlled, tolerating Pradaxa  Hypertension Improved  on recheck. No changes to meds today  Apnea, sleep She is using her CPAP nightly although she still gets up 2-3 x a night to urinate. Encouraged to use CPAP nightly and agrees. Reports she does feel better on it. Her oxygen comes off it frequently. Will see [facility] can adjust her equipment  Hyperglycemia Repeat [id number] with next visit, no concerning symptoms  HYPERLIPIDEMIA Repeat lipid panel with next blood draw.  URINARY FREQUENCY Check urinalysis today

## 2012-12-27 LAB — URINE CULTURE

## 2012-12-28 ENCOUNTER — Telehealth: Payer: Self-pay

## 2012-12-28 NOTE — Telephone Encounter (Signed)
I spoke with Baird Lyons at [facility] and he stated he would call pt

## 2012-12-28 NOTE — Telephone Encounter (Signed)
Message copied by Court Joy on Mon Dec 28, 2012 10:45 AM ------      Message from: Danise Edge A      Created: Fri Dec 25, 2012  2:34 PM       Can you call [facility] and let them know her O2 tube keeps disconnecting from her CPAP mask and see if they can send someone out to fix this.            THX ------

## 2012-12-28 NOTE — Telephone Encounter (Signed)
Done see previous note

## 2012-12-29 NOTE — Progress Notes (Signed)
Quick Note:  Patient Informed and voiced understanding ______ 

## 2013-01-07 DIAGNOSIS — R946 Abnormal results of thyroid function studies: Secondary | ICD-10-CM | POA: Diagnosis not present

## 2013-01-13 ENCOUNTER — Other Ambulatory Visit: Payer: Medicare Other

## 2013-01-14 ENCOUNTER — Encounter: Payer: Self-pay | Admitting: Family Medicine

## 2013-01-20 ENCOUNTER — Ambulatory Visit: Payer: Medicare Other | Admitting: Family Medicine

## 2013-01-28 ENCOUNTER — Telehealth: Payer: Self-pay | Admitting: Family Medicine

## 2013-01-28 DIAGNOSIS — R609 Edema, unspecified: Secondary | ICD-10-CM

## 2013-01-28 NOTE — Telephone Encounter (Signed)
Refill- furosemide 20mg  tablet. Take one tablet by mouth every day. Qty 30 date written 6.18.13

## 2013-01-29 MED ORDER — FUROSEMIDE 20 MG PO TABS: 20.0000 mg | ORAL_TABLET | Freq: Every day | ORAL | Status: DC | PRN

## 2013-01-30 ENCOUNTER — Other Ambulatory Visit: Payer: Self-pay | Admitting: Family Medicine

## 2013-02-01 NOTE — Telephone Encounter (Signed)
Rx request to pharmacy/[facility]  

## 2013-02-07 ENCOUNTER — Other Ambulatory Visit: Payer: Self-pay | Admitting: Family Medicine

## 2013-03-10 ENCOUNTER — Other Ambulatory Visit (INDEPENDENT_AMBULATORY_CARE_PROVIDER_SITE_OTHER): Payer: Medicare Other

## 2013-03-10 DIAGNOSIS — I10 Essential (primary) hypertension: Secondary | ICD-10-CM

## 2013-03-10 DIAGNOSIS — E0590 Thyrotoxicosis, unspecified without thyrotoxic crisis or storm: Secondary | ICD-10-CM

## 2013-03-10 LAB — HEPATIC FUNCTION PANEL
ALT: 13 U/L (ref 0–35)
AST: 16 U/L (ref 0–37)
Albumin: 3.6 g/dL (ref 3.5–5.2)
Alkaline Phosphatase: 65 U/L (ref 39–117)
Total Protein: 6.8 g/dL (ref 6.0–8.3)

## 2013-03-10 LAB — T4, FREE: Free T4: 1.45 ng/dL (ref 0.60–1.60)

## 2013-03-10 LAB — RENAL FUNCTION PANEL
Albumin: 3.6 g/dL (ref 3.5–5.2)
BUN: 20 mg/dL (ref 6–23)
Creatinine, Ser: 1 mg/dL (ref 0.4–1.2)
GFR: 58.4 mL/min — ABNORMAL LOW (ref >60.00)
Phosphorus: 3.3 mg/dL (ref 2.3–4.6)

## 2013-03-10 LAB — CBC
HCT: 40.1 % (ref 36.0–46.0)
MCHC: 34.4 g/dL (ref 30.0–36.0)
MCV: 93.4 fl (ref 78.0–100.0)

## 2013-03-10 NOTE — Progress Notes (Signed)
Labs only

## 2013-03-16 ENCOUNTER — Telehealth: Payer: Self-pay | Admitting: Family Medicine

## 2013-03-16 MED ORDER — RANITIDINE HCL 150 MG PO TABS: 150.0000 mg | ORAL_TABLET | Freq: Every day | ORAL | Status: DC

## 2013-03-16 NOTE — Telephone Encounter (Signed)
OK to refill Ranitidine 150 mg po daily prn reflux disp #30 1 rf

## 2013-03-16 NOTE — Telephone Encounter (Signed)
Left a message for pt to return my call. I don't see Ranitidine on medlist?

## 2013-03-16 NOTE — Telephone Encounter (Signed)
Refill- ranitidine 150mg  capsule. Take one capsule by mouth at bedtime. Qty 30 last fill 12.3.13

## 2013-03-17 ENCOUNTER — Encounter: Payer: Self-pay | Admitting: Family Medicine

## 2013-03-17 ENCOUNTER — Ambulatory Visit: Payer: Medicare Other | Admitting: Family Medicine

## 2013-03-17 ENCOUNTER — Ambulatory Visit (INDEPENDENT_AMBULATORY_CARE_PROVIDER_SITE_OTHER): Payer: Medicare Other | Admitting: Family Medicine

## 2013-03-17 VITALS — BP 140/90 | HR 85 | Temp 97.6°F | Ht 68.0 in | Wt 236.1 lb

## 2013-03-17 DIAGNOSIS — I10 Essential (primary) hypertension: Secondary | ICD-10-CM

## 2013-03-17 DIAGNOSIS — R5381 Other malaise: Secondary | ICD-10-CM | POA: Diagnosis not present

## 2013-03-17 DIAGNOSIS — G4730 Sleep apnea, unspecified: Secondary | ICD-10-CM

## 2013-03-17 DIAGNOSIS — I4891 Unspecified atrial fibrillation: Secondary | ICD-10-CM

## 2013-03-17 DIAGNOSIS — R609 Edema, unspecified: Secondary | ICD-10-CM

## 2013-03-17 DIAGNOSIS — R5383 Other fatigue: Secondary | ICD-10-CM

## 2013-03-17 DIAGNOSIS — J9610 Chronic respiratory failure, unspecified whether with hypoxia or hypercapnia: Secondary | ICD-10-CM

## 2013-03-17 NOTE — Patient Instructions (Signed)
Needs next appt at [facility] due to difficulty with getting here, try Dr Selena Batten  Sleep Apnea  Sleep apnea is a sleep disorder characterized by abnormal pauses in breathing while you sleep. When your breathing pauses, the level of oxygen in your blood decreases. This causes you to move out of deep sleep and into light sleep. As a result, your quality of sleep is poor, and the system that carries your blood throughout your body (cardiovascular system) experiences stress. If sleep apnea remains untreated, the following conditions can develop:  High blood pressure (hypertension).  Coronary artery disease.  Inability to achieve or maintain an erection (impotence).  Impairment of your thought process (cognitive dysfunction). There are three types of sleep apnea: 1. Obstructive sleep apnea Pauses in breathing during sleep because of a blocked airway. 2. Central sleep apnea Pauses in breathing during sleep because the area of the brain that controls your breathing does not send the correct signals to the muscles that control breathing. 3. Mixed sleep apnea A combination of both obstructive and central sleep apnea. RISK FACTORS The following risk factors can increase your risk of developing sleep apnea:  Being overweight.  Smoking.  Having narrow passages in your nose and throat.  Being of older age.  Being female.  Alcohol use.  Sedative and tranquilizer use.  Ethnicity. Among individuals younger than 35 years, African Americans are at increased risk of sleep apnea. SYMPTOMS   Difficulty staying asleep.  Daytime sleepiness and fatigue.  Loss of energy.  Irritability.  Loud, heavy snoring.  Morning headaches.  Trouble concentrating.  Forgetfulness.  Decreased interest in sex. DIAGNOSIS  In order to diagnose sleep apnea, your caregiver will perform a physical examination. Your caregiver may suggest that you take a home sleep test. Your caregiver may also recommend that you  spend the night in a sleep lab. In the sleep lab, several monitors record information about your heart, lungs, and brain while you sleep. Your leg and arm movements and blood oxygen level are also recorded. TREATMENT The following actions may help to resolve mild sleep apnea:  Sleeping on your side.   Using a decongestant if you have nasal congestion.   Avoiding the use of depressants, including alcohol, sedatives, and narcotics.   Losing weight and modifying your diet if you are overweight. There also are devices and treatments to help open your airway:  Oral appliances. These are custom-made mouthpieces that shift your lower jaw forward and slightly open your bite. This opens your airway.  Devices that create positive airway pressure. This positive pressure "splints" your airway open to help you breathe better during sleep. The following devices create positive airway pressure:  Continuous positive airway pressure (CPAP) device. The CPAP device creates a continuous level of air pressure with an air pump. The air is delivered to your airway through a mask while you sleep. This continuous pressure keeps your airway open.  Nasal expiratory positive airway pressure (EPAP) device. The EPAP device creates positive air pressure as you exhale. The device consists of single-use valves, which are inserted into each nostril and held in place by adhesive. The valves create very little resistance when you inhale but create much more resistance when you exhale. That increased resistance creates the positive airway pressure. This positive pressure while you exhale keeps your airway open, making it easier to breath when you inhale again.  Bilevel positive airway pressure (BPAP) device. The BPAP device is used mainly in patients with central sleep apnea. This device  is similar to the CPAP device because it also uses an air pump to deliver continuous air pressure through a mask. However, with the BPAP  machine, the pressure is set at two different levels. The pressure when you exhale is lower than the pressure when you inhale.  Surgery. Typically, surgery is only done if you cannot comply with less invasive treatments or if the less invasive treatments do not improve your condition. Surgery involves removing excess tissue in your airway to create a wider passage way. Document Released: 10/04/2002 Document Revised: 04/14/2012 Document Reviewed: 02/20/2012 [facility] Patient Information 2014 [place], [place].

## 2013-03-19 NOTE — Assessment & Plan Note (Signed)
Rate controlled, tolerating Pradaxa 

## 2013-03-19 NOTE — Progress Notes (Signed)
Patient ID: Gina Reilly, female   DOB: 1924-10-14, 79 y.o.   MRN: [id number] Gina Reilly [id number] [birth year] 03/19/2013      Progress Note-Follow Up  Subjective  Chief Complaint  Chief Complaint  Patient presents with  . Follow-up    3 month    HPI  Patient is an 79 year old Caucasian female in today for followup. She is overall doing well. She says she continues to struggle with fatigue and dyspnea but that they're both improved since treating her CPAP. She uses her CPAP nightly. Her major complaint is restless leg symptoms in the evenings. She says is sensitive her legs being uncomfortable in the evenings and she has to get up and move. No chest pain, palpitations, shortness of breath, recent trips to the ER, GI or GU concerns noted today  Past Medical History  Diagnosis Date  . Anemia   . GERD (gastroesophageal reflux disease)   . Thyroid disease     hypothroidism  . Arthritis     osteo  . Hyperlipidemia   . Vitamin D deficiency   . Chicken pox as a child  . Mumps as a child  . Measles as a child  . Hypertension   . Skin cancer     left temple  . Obese   . Ovarian cyst   . Pelvic mass in female   . Bronchitis 12/25/2011  . Contusion of leg, left 01/16/2012  . Peripheral neuropathy 02/26/2012  . Hyperglycemia 01/10/2010    Qualifier: Diagnosis of  By: Alphonzo Severance MD, Loni Dolly   . Atrial fibrillation     Status post TEE guided DCCV 07/2010:  Transesophageal Echocardiogram 07/2010: EF 60-65% mild MR.;  Amiodarone;  Pradaxa  . Hypoxia 04/14/2012  . Apnea, sleep 04/24/2012  . Hyperthyroidism 02/09/2009    Qualifier: Diagnosis of  By: Alphonzo Severance MD, Loni Dolly   . Intermittent claudication 05/22/2012    Past Surgical History  Procedure Laterality Date  . Cataract extraction      Dr. Burgess Estelle  . Total hip arthroplasty  2003    left  . Abdominal hysterectomy    . Skin biopsy      left temple. cancerous    Family History  Problem Relation Age of Onset  . Stroke Mother    . Cancer Father     prostate  . Hypertension Brother   . Cancer Maternal Grandmother   . Hypertension Brother   . Heart disease Brother   . Obesity Brother   . Hypertension Brother   . Hyperlipidemia Brother   . Heart disease Brother   . Kidney disease Brother   . Cancer Daughter     kidney- spine and blood stream  . Heart disease Son     valvular heart disease    History   Social History  . Marital Status: Widowed    Spouse Name: N/A    Number of Children: 9  . Years of Education: N/A   Occupational History  .     Social History Main Topics  . Smoking status: Former Smoker -- 0.30 packs/day for 5 years    Types: Cigarettes    Quit date: 10/28/1973  . Smokeless tobacco: Never Used  . Alcohol Use: Yes     Comment: socially  . Drug Use: No  . Sexually Active: Not on file   Other Topics Concern  . Not on file   Social History Narrative  . No narrative on file    Current  Outpatient Prescriptions on File Prior to Visit  Medication Sig Dispense Refill  . acetaminophen (TYLENOL 8 HOUR) 650 MG CR tablet Take 650 mg by mouth daily. Takes no more then 3000mg  daily      . diltiazem (DILACOR XR) 180 MG 24 hr capsule Take 1 capsule (180 mg total) by mouth daily.  30 capsule  11  . losartan (COZAAR) 100 MG tablet TAKE 1 TABLET (100 MG TOTAL) BY MOUTH DAILY.  30 tablet  11  . PRADAXA 150 MG CAPS TAKE 1 CAPSULE (150 MG TOTAL) BY MOUTH 2 (TWO) TIMES DAILY.  60 capsule  5  . ranitidine (ZANTAC) 150 MG tablet Take 1 tablet (150 mg total) by mouth daily.  30 tablet  1   No current facility-administered medications on file prior to visit.    Allergies  Allergen Reactions  . Amiodarone Other (See Comments)    Affecting the Lungs  . Aspercreme (Trolamine Salicylate)     Review of Systems  Review of Systems  Constitutional: Positive for malaise/fatigue. Negative for fever.  HENT: Negative for congestion.   Eyes: Negative for discharge.  Respiratory: Positive for  shortness of breath.   Cardiovascular: Negative for chest pain, palpitations and leg swelling.  Gastrointestinal: Negative for nausea, abdominal pain and diarrhea.  Genitourinary: Negative for dysuria.  Musculoskeletal: Negative for falls.  Skin: Negative for rash.  Neurological: Negative for loss of consciousness and headaches.  Endo/Heme/Allergies: Negative for polydipsia.  Psychiatric/Behavioral: Negative for depression and suicidal ideas. The patient is not nervous/anxious and does not have insomnia.     Objective  BP 140/90  Pulse 85  Temp(Src) 97.6 F (36.4 C) (Oral)  Ht 5\' 8"  (1.727 m)  Wt 236 lb 1.9 oz (107.103 kg)  BMI 35.91 kg/m2  SpO2 96%  LMP 12/25/2011  Physical Exam  Physical Exam  Constitutional: She is oriented to person, place, and time and well-developed, well-nourished, and in no distress. No distress.  HENT:  [place]: Normocephalic and atraumatic.  Left Ear: External ear normal.  Eyes: Conjunctivae are normal.  Neck: Neck supple. No thyromegaly present.  Cardiovascular:  Murmur heard. Pulmonary/Chest: Effort normal and breath sounds normal. She has no wheezes.  Abdominal: She exhibits no distension and no mass.  Musculoskeletal: She exhibits no edema.  Lymphadenopathy:    She has no cervical adenopathy.  Neurological: She is alert and oriented to person, place, and time.  Skin: Skin is warm and dry. No rash noted. She is not diaphoretic.  Psychiatric: Memory, affect and judgment normal.    Lab Results  Component Value Date   TSH 1.27 03/10/2013   Lab Results  Component Value Date   WBC 7.1 03/10/2013   HGB 13.8 03/10/2013   HCT 40.1 03/10/2013   MCV 93.4 03/10/2013   PLT 209.0 03/10/2013   Lab Results  Component Value Date   CREATININE 1.0 03/10/2013   BUN 20 03/10/2013   NA 140 03/10/2013   K 4.9 03/10/2013   CL 107 03/10/2013   CO2 27 03/10/2013   Lab Results  Component Value Date   ALT 13 03/10/2013   AST 16 03/10/2013   ALKPHOS 65 03/10/2013    BILITOT 0.6 03/10/2013   Lab Results  Component Value Date   CHOL 161 01/08/2012   Lab Results  Component Value Date   HDL 59.70 01/08/2012   Lab Results  Component Value Date   LDLCALC 86 01/08/2012   Lab Results  Component Value Date   TRIG 77.0 01/08/2012  Lab Results  Component Value Date   CHOLHDL 3 01/08/2012     Assessment & Plan  Hypertension Well controlled on current meds.  FATIGUE Feeling better with sleep apnea treated  ATRIAL FIBRILLATION Rate controlled, tolerating Pradaxa  Apnea, sleep Doing well with CPAP using nightly  Chronic respiratory failure Patient had difficulty getting here, she will have her follow up appt at [facility] due to difficulty getting here [place] of [place]

## 2013-03-19 NOTE — Assessment & Plan Note (Signed)
Doing well with CPAP using nightly

## 2013-03-19 NOTE — Assessment & Plan Note (Signed)
Patient had difficulty getting here, she will have her follow up appt at [facility] due to difficulty getting here [place] 

## 2013-03-19 NOTE — Assessment & Plan Note (Signed)
Well-controlled on current meds 

## 2013-03-19 NOTE — Assessment & Plan Note (Signed)
Feeling better with sleep apnea treated

## 2013-03-24 ENCOUNTER — Telehealth: Payer: Self-pay | Admitting: *Deleted

## 2013-03-24 MED ORDER — RANITIDINE HCL 150 MG PO TABS: 150.0000 mg | ORAL_TABLET | Freq: Every day | ORAL | Status: DC

## 2013-03-24 NOTE — Telephone Encounter (Signed)
Resent rx due to patient stating that she had received medication.

## 2013-04-12 ENCOUNTER — Other Ambulatory Visit: Payer: Self-pay | Admitting: *Deleted

## 2013-04-12 MED ORDER — DABIGATRAN ETEXILATE MESYLATE 150 MG PO CAPS: ORAL_CAPSULE | ORAL | Status: DC

## 2013-06-11 ENCOUNTER — Ambulatory Visit: Payer: Medicare Other | Admitting: Family Medicine

## 2013-06-14 ENCOUNTER — Ambulatory Visit: Payer: Medicare Other | Admitting: Family Medicine

## 2013-06-25 ENCOUNTER — Encounter: Payer: Self-pay | Admitting: Family Medicine

## 2013-06-25 ENCOUNTER — Ambulatory Visit (INDEPENDENT_AMBULATORY_CARE_PROVIDER_SITE_OTHER): Payer: Medicare Other | Admitting: Family Medicine

## 2013-06-25 VITALS — BP 145/87 | HR 95 | Temp 98.0°F | Ht 68.0 in | Wt 241.8 lb

## 2013-06-25 DIAGNOSIS — I4891 Unspecified atrial fibrillation: Secondary | ICD-10-CM | POA: Diagnosis not present

## 2013-06-25 DIAGNOSIS — I10 Essential (primary) hypertension: Secondary | ICD-10-CM | POA: Diagnosis not present

## 2013-06-25 DIAGNOSIS — K219 Gastro-esophageal reflux disease without esophagitis: Secondary | ICD-10-CM

## 2013-06-25 DIAGNOSIS — R52 Pain, unspecified: Secondary | ICD-10-CM

## 2013-06-25 DIAGNOSIS — G4730 Sleep apnea, unspecified: Secondary | ICD-10-CM

## 2013-06-25 MED ORDER — DILTIAZEM HCL ER 180 MG PO CP24: 180.0000 mg | ORAL_CAPSULE | Freq: Every day | ORAL | Status: DC

## 2013-06-25 MED ORDER — RANITIDINE HCL 150 MG PO TABS: 150.0000 mg | ORAL_TABLET | Freq: Every day | ORAL | Status: DC | PRN

## 2013-06-25 MED ORDER — LOSARTAN POTASSIUM 100 MG PO TABS: 100.0000 mg | ORAL_TABLET | Freq: Every day | ORAL | Status: DC

## 2013-06-25 MED ORDER — ACETAMINOPHEN 500 MG PO TABS: 1000.0000 mg | ORAL_TABLET | Freq: Three times a day (TID) | ORAL | Status: DC | PRN

## 2013-06-25 NOTE — Patient Instructions (Signed)
Tylenol 500 mg 2 tabs three times daily is max, as needed (3000mg  in 24 hours) or 325 mg tabs 3 at a time up to 3 x a day   If the blood pressure remains low despite moving dose of medicine to morning then drop the Losartan 100 mg tab to 1/2 tablet daily and call for follow up appt in 1 months  DASH Diet The DASH diet stands for "Dietary Approaches to Stop Hypertension." It is a healthy eating plan that has been shown to reduce high blood pressure (hypertension) in as little as 14 days, while also possibly providing other significant health benefits. These other health benefits include reducing the risk of breast cancer after menopause and reducing the risk of type 2 diabetes, heart disease, colon cancer, and stroke. Health benefits also include weight loss and slowing kidney failure in patients with chronic kidney disease.  DIET GUIDELINES  Limit salt (sodium). Your diet should contain less than 1500 mg of sodium daily.  Limit refined or processed carbohydrates. Your diet should include mostly whole grains. Desserts and added sugars should be used sparingly.  Include small amounts of heart-healthy fats. These types of fats include nuts, oils, and tub margarine. Limit saturated and trans fats. These fats have been shown to be harmful in the body. CHOOSING FOODS  The following food groups are based on a 2000 calorie diet. See your Registered Dietitian for individual calorie needs. Grains and Grain Products (6 to 8 servings daily)  Eat More Often: Whole-wheat bread, brown rice, whole-grain or wheat pasta, quinoa, popcorn without added fat or salt (air popped).  Eat Less Often: White bread, white pasta, white rice, cornbread. Vegetables (4 to 5 servings daily)  Eat More Often: Fresh, frozen, and canned vegetables. Vegetables may be raw, steamed, roasted, or grilled with a minimal amount of fat.  Eat Less Often/Avoid: Creamed or fried vegetables. Vegetables in a cheese sauce. Fruit (4 to 5  servings daily)  Eat More Often: All fresh, canned (in natural juice), or frozen fruits. Dried fruits without added sugar. One hundred percent fruit juice ( cup [237 mL] daily).  Eat Less Often: Dried fruits with added sugar. Canned fruit in light or heavy syrup. [employer], Fish, and Poultry (2 servings or less daily. One serving is 3 to 4 oz [85-114 g]).  Eat More Often: Ninety percent or leaner ground beef, tenderloin, sirloin. Round cuts of beef, chicken breast, [place] breast. All fish. Grill, bake, or broil your meat. Nothing should be fried.  Eat Less Often/Avoid: Fatty cuts of meat, [place], or chicken leg, thigh, or wing. Fried cuts of meat or fish. Dairy (2 to 3 servings)  Eat More Often: Low-fat or fat-free milk, low-fat plain or light yogurt, reduced-fat or part-skim cheese.  Eat Less Often/Avoid: Milk (whole, 2%).Whole milk yogurt. Full-fat cheeses. Nuts, Seeds, and Legumes (4 to 5 servings per week)  Eat More Often: All without added salt.  Eat Less Often/Avoid: Salted nuts and seeds, canned beans with added salt. Fats and Sweets (limited)  Eat More Often: Vegetable oils, tub margarines without trans fats, sugar-free gelatin. Mayonnaise and salad dressings.  Eat Less Often/Avoid: Coconut oils, palm oils, butter, stick margarine, cream, half and half, cookies, candy, pie. FOR MORE INFORMATION The Dash Diet Eating Plan: www.dashdiet.org Document Released: 10/03/2011 Document Revised: 01/06/2012 Document Reviewed: 10/03/2011 [facility] Patient Information 2014 [place], [place].

## 2013-06-28 ENCOUNTER — Encounter: Payer: Self-pay | Admitting: Family Medicine

## 2013-06-28 NOTE — Assessment & Plan Note (Signed)
Well controlled, no changes 

## 2013-06-28 NOTE — Assessment & Plan Note (Signed)
Will have APS go out and interrogate her monitor, needs recertification. Is using her machine nightly

## 2013-06-28 NOTE — Progress Notes (Signed)
Patient ID: Gina Reilly, female   DOB: Mar 12, 1924, 79 y.o.   MRN: [id number] Gina Reilly [id number] 1923/12/30 06/28/2013      Progress Note-Follow Up  Subjective  Chief Complaint  Chief Complaint  Patient presents with  . Follow-up    HPI  Patient is an 79 year old Caucasian female who is in today for followup.. Now struggling with increased fatigue and shortness of breath in July but feels better now. Is using her CPAP regularly. No recent illness. No chest pain, palpitations, shortness of breath, GI or GU complaints at the time.  Past Medical History  Diagnosis Date  . Anemia   . GERD (gastroesophageal reflux disease)   . Thyroid disease     hypothroidism  . Arthritis     osteo  . Hyperlipidemia   . Vitamin D deficiency   . Chicken pox as a child  . Mumps as a child  . Measles as a child  . Hypertension   . Skin cancer     left temple  . Obese   . Ovarian cyst   . Pelvic mass in female   . Bronchitis 12/25/2011  . Contusion of leg, left 01/16/2012  . Peripheral neuropathy 02/26/2012  . Hyperglycemia 01/10/2010    Qualifier: Diagnosis of  By: Alphonzo Severance MD, Loni Dolly   . Atrial fibrillation     Status post TEE guided DCCV 07/2010:  Transesophageal Echocardiogram 07/2010: EF 60-65% mild MR.;  Amiodarone;  Pradaxa  . Hypoxia 04/14/2012  . Apnea, sleep 04/24/2012  . Hyperthyroidism 02/09/2009    Qualifier: Diagnosis of  By: Alphonzo Severance MD, Loni Dolly   . Intermittent claudication 05/22/2012    Past Surgical History  Procedure Laterality Date  . Cataract extraction      Dr. Burgess Estelle  . Total hip arthroplasty  2003    left  . Abdominal hysterectomy    . Skin biopsy      left temple. cancerous    Family History  Problem Relation Age of Onset  . Stroke Mother   . Cancer Father     prostate  . Hypertension Brother   . Cancer Maternal Grandmother   . Hypertension Brother   . Heart disease Brother   . Obesity Brother   . Hypertension Brother   . Hyperlipidemia  Brother   . Heart disease Brother   . Kidney disease Brother   . Cancer Daughter     kidney- spine and blood stream  . Heart disease Son     valvular heart disease    History   Social History  . Marital Status: Widowed    Spouse Name: N/A    Number of Children: 9  . Years of Education: N/A   Occupational History  .     Social History Main Topics  . Smoking status: Former Smoker -- 0.30 packs/day for 5 years    Types: Cigarettes    Quit date: 10/28/1973  . Smokeless tobacco: Never Used  . Alcohol Use: Yes     Comment: socially  . Drug Use: No  . Sexual Activity: Not on file   Other Topics Concern  . Not on file   Social History Narrative  . No narrative on file    Current Outpatient Prescriptions on File Prior to Visit  Medication Sig Dispense Refill  . dabigatran (PRADAXA) 150 MG CAPS TAKE 1 CAPSULE (150 MG TOTAL) BY MOUTH 2 (TWO) TIMES DAILY.  180 capsule  3  . furosemide (LASIX) 20 MG  tablet Take 1 tablet (20 mg total) by mouth daily as needed (edema).  30 tablet  3   No current facility-administered medications on file prior to visit.    Allergies  Allergen Reactions  . Amiodarone Other (See Comments)    Affecting the Lungs  . Aspercreme [Trolamine Salicylate]     Review of Systems  Review of Systems  Constitutional: Negative for fever and malaise/fatigue.  HENT: Negative for congestion.   Eyes: Negative for discharge.  Respiratory: Negative for shortness of breath.   Cardiovascular: Negative for chest pain, palpitations and leg swelling.  Gastrointestinal: Negative for nausea, abdominal pain and diarrhea.  Genitourinary: Negative for dysuria.  Musculoskeletal: Negative for falls.  Skin: Negative for rash.  Neurological: Negative for loss of consciousness and headaches.  Endo/Heme/Allergies: Negative for polydipsia.  Psychiatric/Behavioral: Negative for depression and suicidal ideas. The patient is not nervous/anxious and does not have insomnia.      Objective  BP 145/87  Pulse 95  Temp(Src) 98 F (36.7 C) (Oral)  Ht 5\' 8"  (1.727 m)  Wt 241 lb 12 oz (109.657 kg)  BMI 36.77 kg/m2  SpO2 93%  LMP 12/25/2011  Physical Exam  Physical Exam  Constitutional: She is oriented to person, place, and time and well-developed, well-nourished, and in no distress. No distress.  HENT:  [place]: Normocephalic and atraumatic.  Eyes: Conjunctivae are normal.  Neck: Neck supple. No thyromegaly present.  Cardiovascular: Normal rate, regular rhythm and normal heart sounds.   Pulmonary/Chest: Effort normal and breath sounds normal. She has no wheezes.  Abdominal: She exhibits no distension and no mass.  Musculoskeletal: She exhibits no edema.  Lymphadenopathy:    She has no cervical adenopathy.  Neurological: She is alert and oriented to person, place, and time.  Skin: Skin is warm and dry. No rash noted. She is not diaphoretic.  Psychiatric: Memory, affect and judgment normal.    Lab Results  Component Value Date   TSH 1.27 03/10/2013   Lab Results  Component Value Date   WBC 7.1 03/10/2013   HGB 13.8 03/10/2013   HCT 40.1 03/10/2013   MCV 93.4 03/10/2013   PLT 209.0 03/10/2013   Lab Results  Component Value Date   CREATININE 1.0 03/10/2013   BUN 20 03/10/2013   NA 140 03/10/2013   K 4.9 03/10/2013   CL 107 03/10/2013   CO2 27 03/10/2013   Lab Results  Component Value Date   ALT 13 03/10/2013   AST 16 03/10/2013   ALKPHOS 65 03/10/2013   BILITOT 0.6 03/10/2013   Lab Results  Component Value Date   CHOL 161 01/08/2012   Lab Results  Component Value Date   HDL 59.70 01/08/2012   Lab Results  Component Value Date   LDLCALC 86 01/08/2012   Lab Results  Component Value Date   TRIG 77.0 01/08/2012   Lab Results  Component Value Date   CHOLHDL 3 01/08/2012     Assessment & Plan  Hypertension Well controlled, no changes.   Apnea, sleep Will have APS go out and interrogate her monitor, needs recertification. Is using her  machine nightly

## 2013-07-04 IMAGING — CR DG TIBIA/FIBULA 2V*L*
4 series · 4 of 4 positions shown · non-contrast
Comparison: None.

CLINICAL DATA: Trauma, pain

LEFT TIBIA AND FIBULA - 2 VIEW

[t tib/fib lat left (1 of 2)]
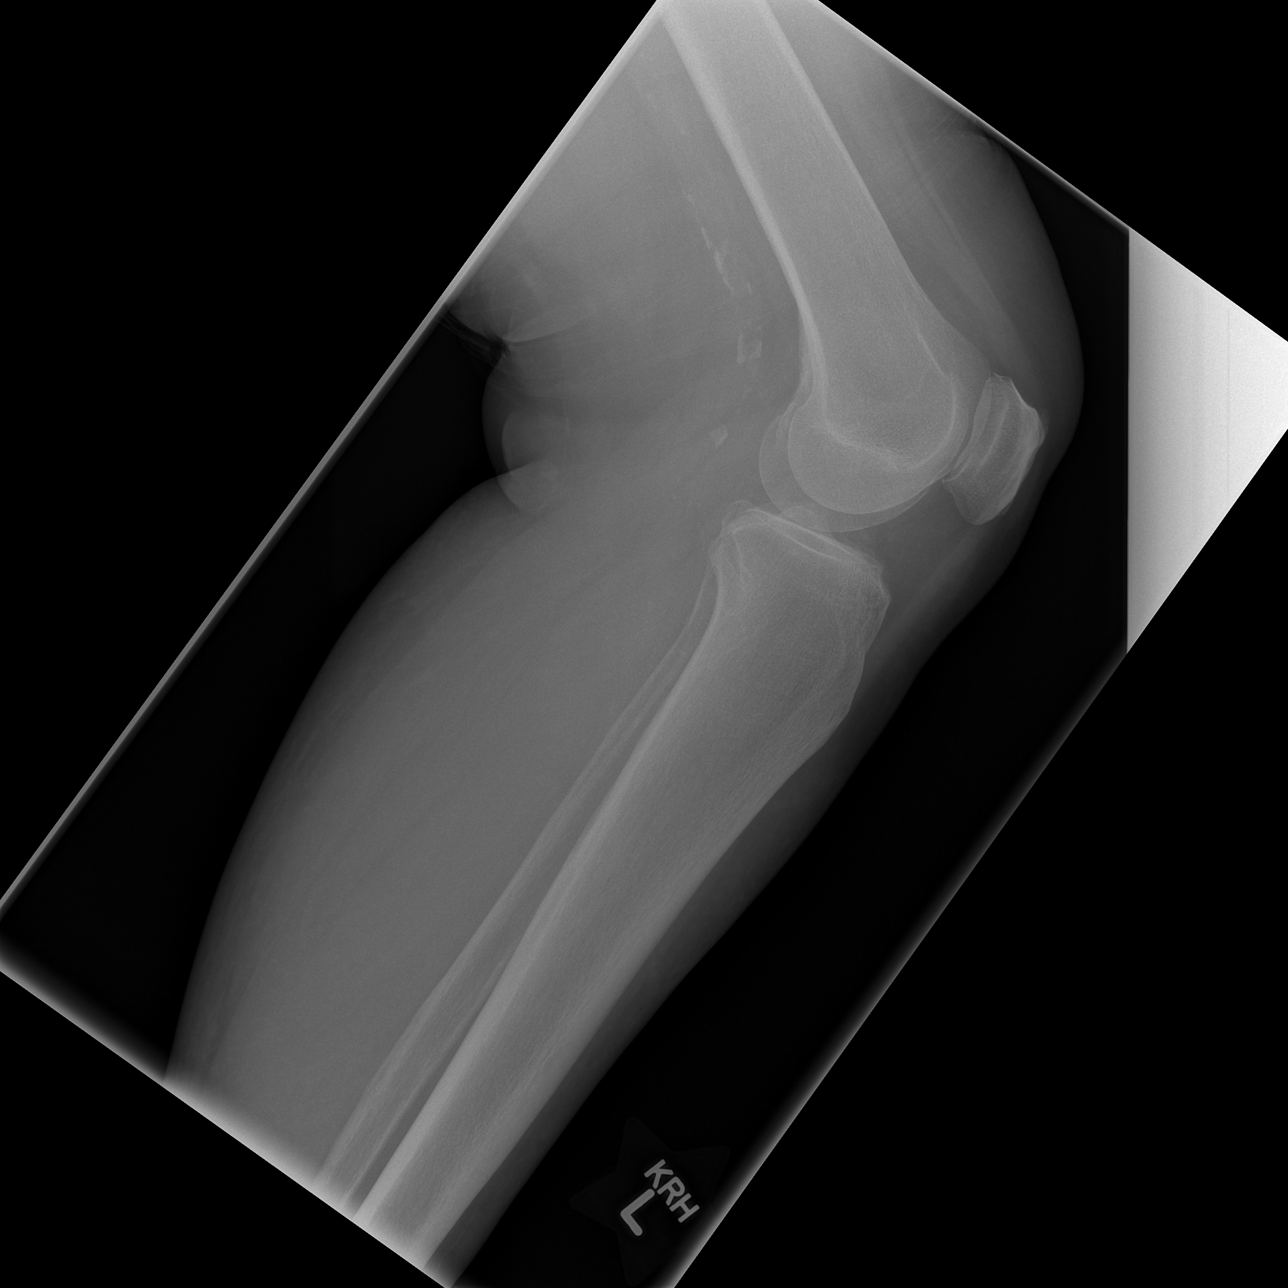

[t tib/fib lat left (2 of 2)]
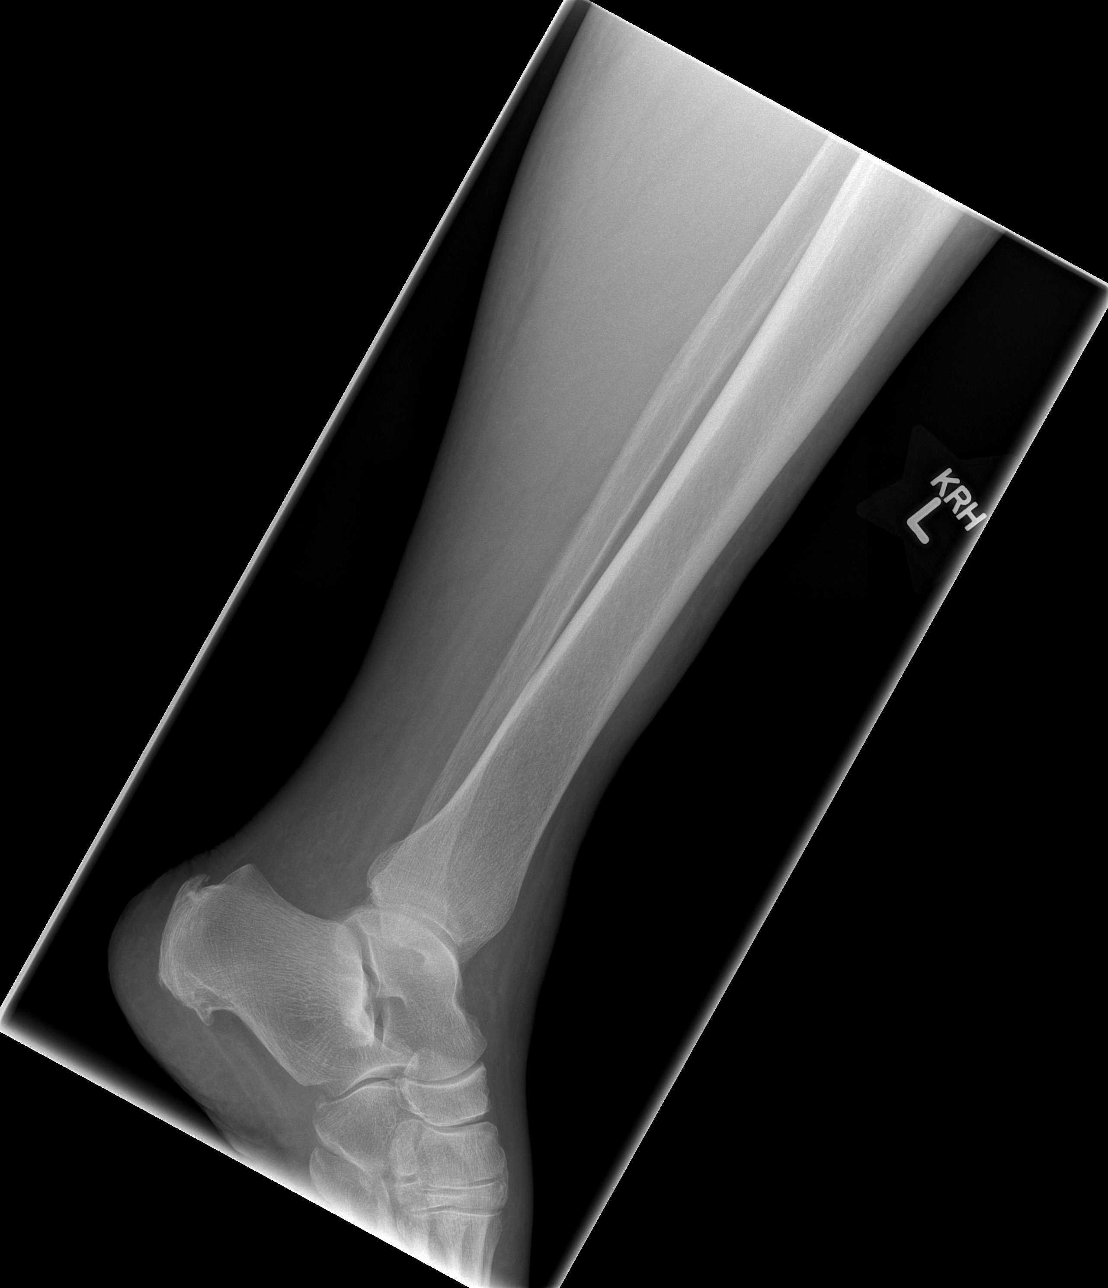

[t tib/fib ap left (1 of 2)]
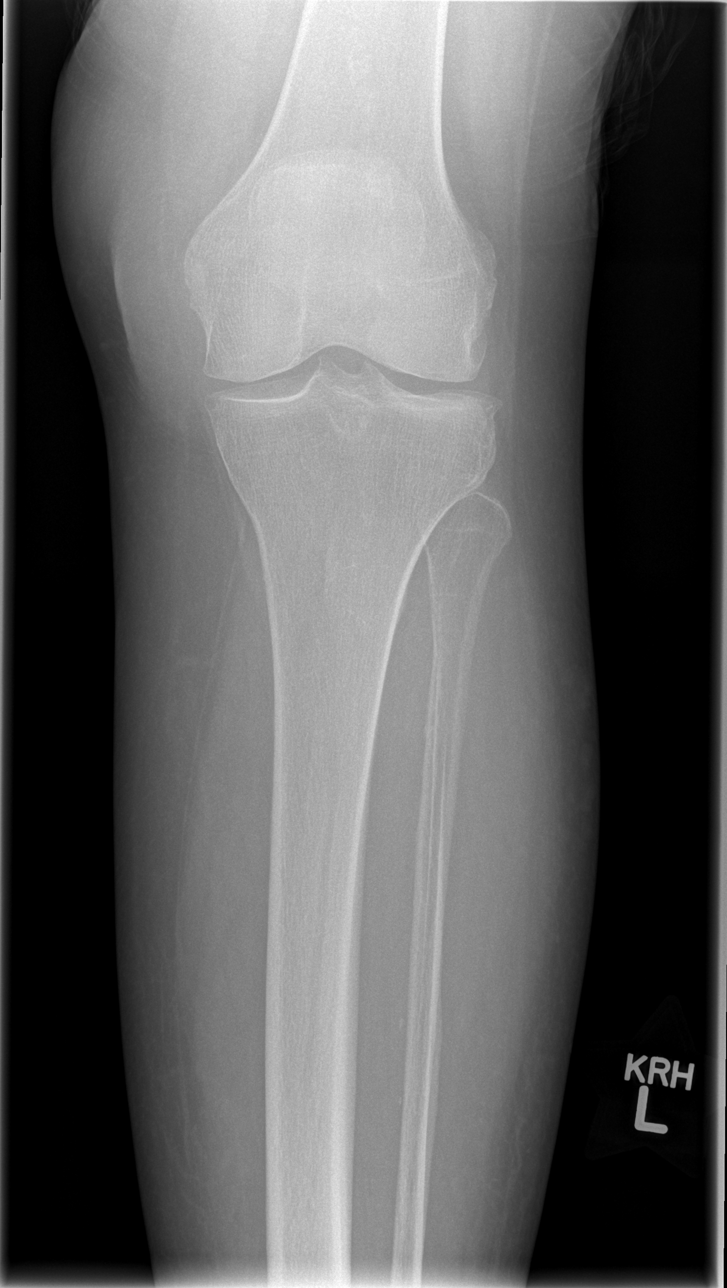

[t tib/fib ap left (2 of 2)]
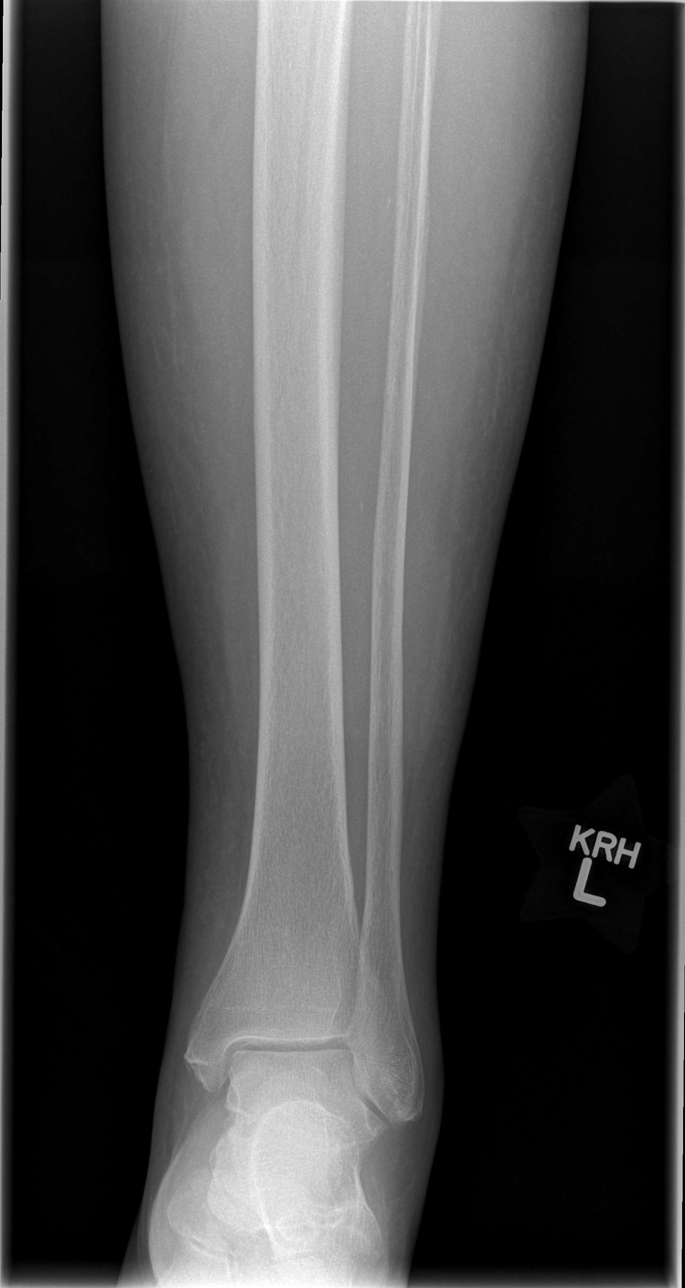

[4 of 4 positions shown; findings below may reference images not displayed]

FINDINGS: Four views of the left tibia-fibula submitted.  No acute
fracture or subluxation.  No periosteal reaction or bony erosion.
Plantar and posterior spurring of the calcaneus.  Atherosclerotic
calcifications are noted femoral and popliteal artery.
IMPRESSION: No acute fracture or subluxation.

## 2013-07-04 IMAGING — CR DG ANKLE COMPLETE 3+V*L*
3 series · 3 of 3 positions shown · non-contrast
Comparison: None.

CLINICAL DATA: Trauma, pain

LEFT ANKLE COMPLETE - 3+ VIEW

[t ankle joint ap left]
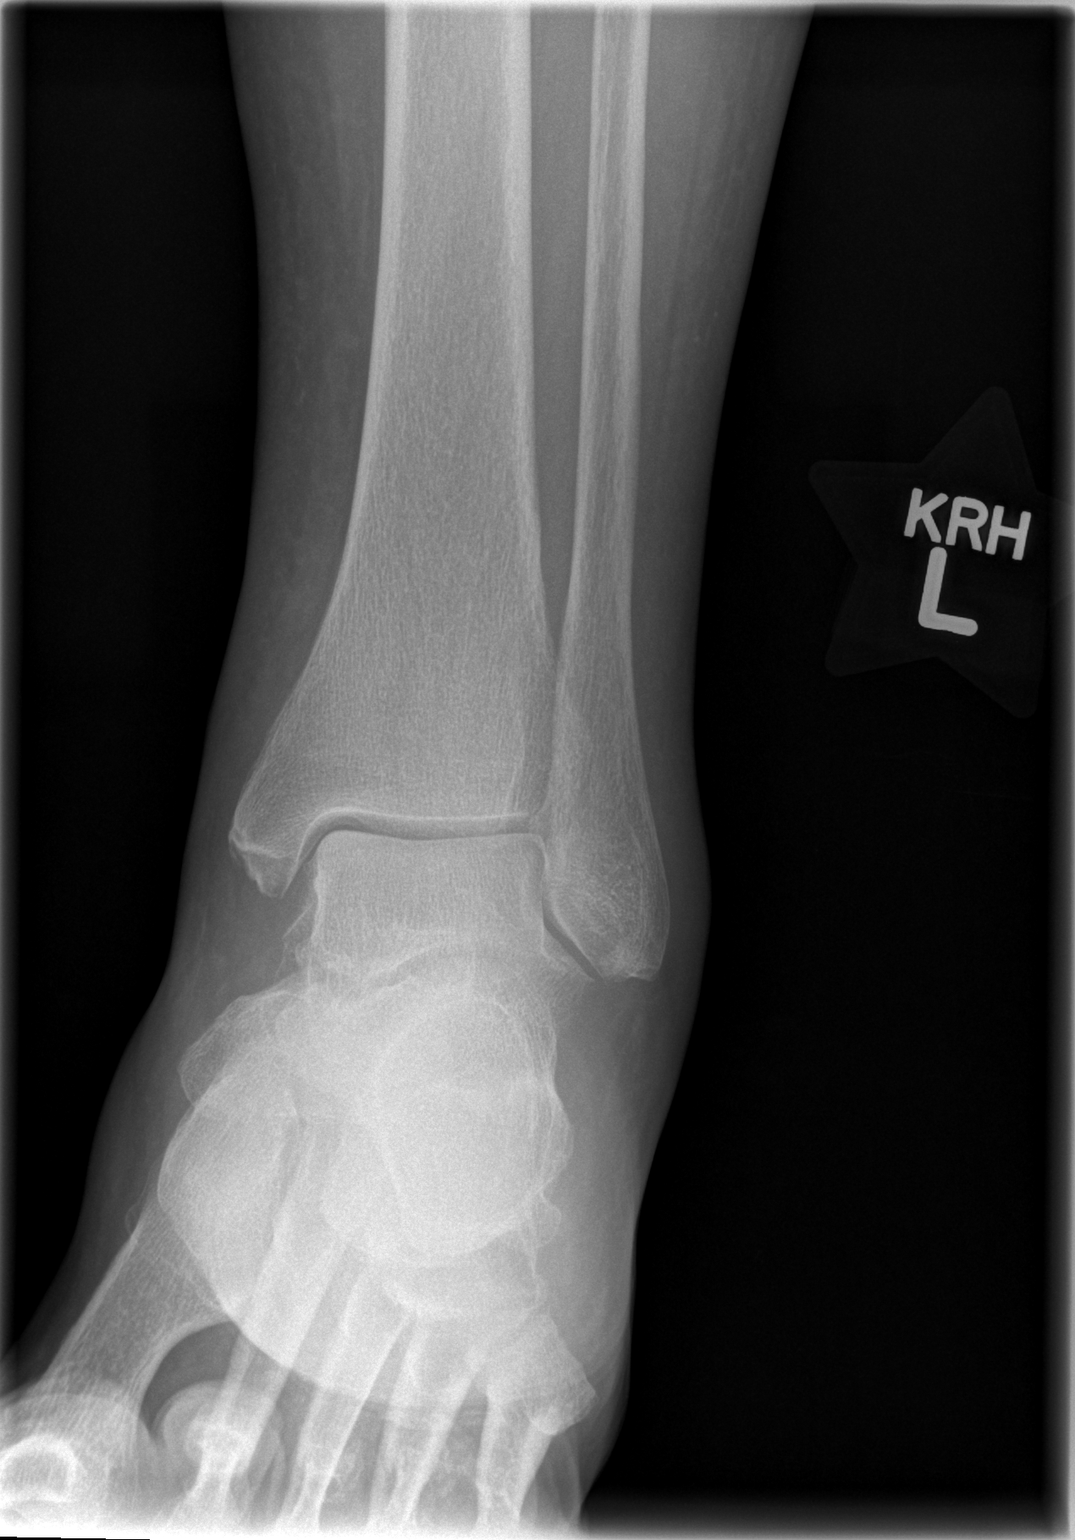

[t ankle joint oblique left]
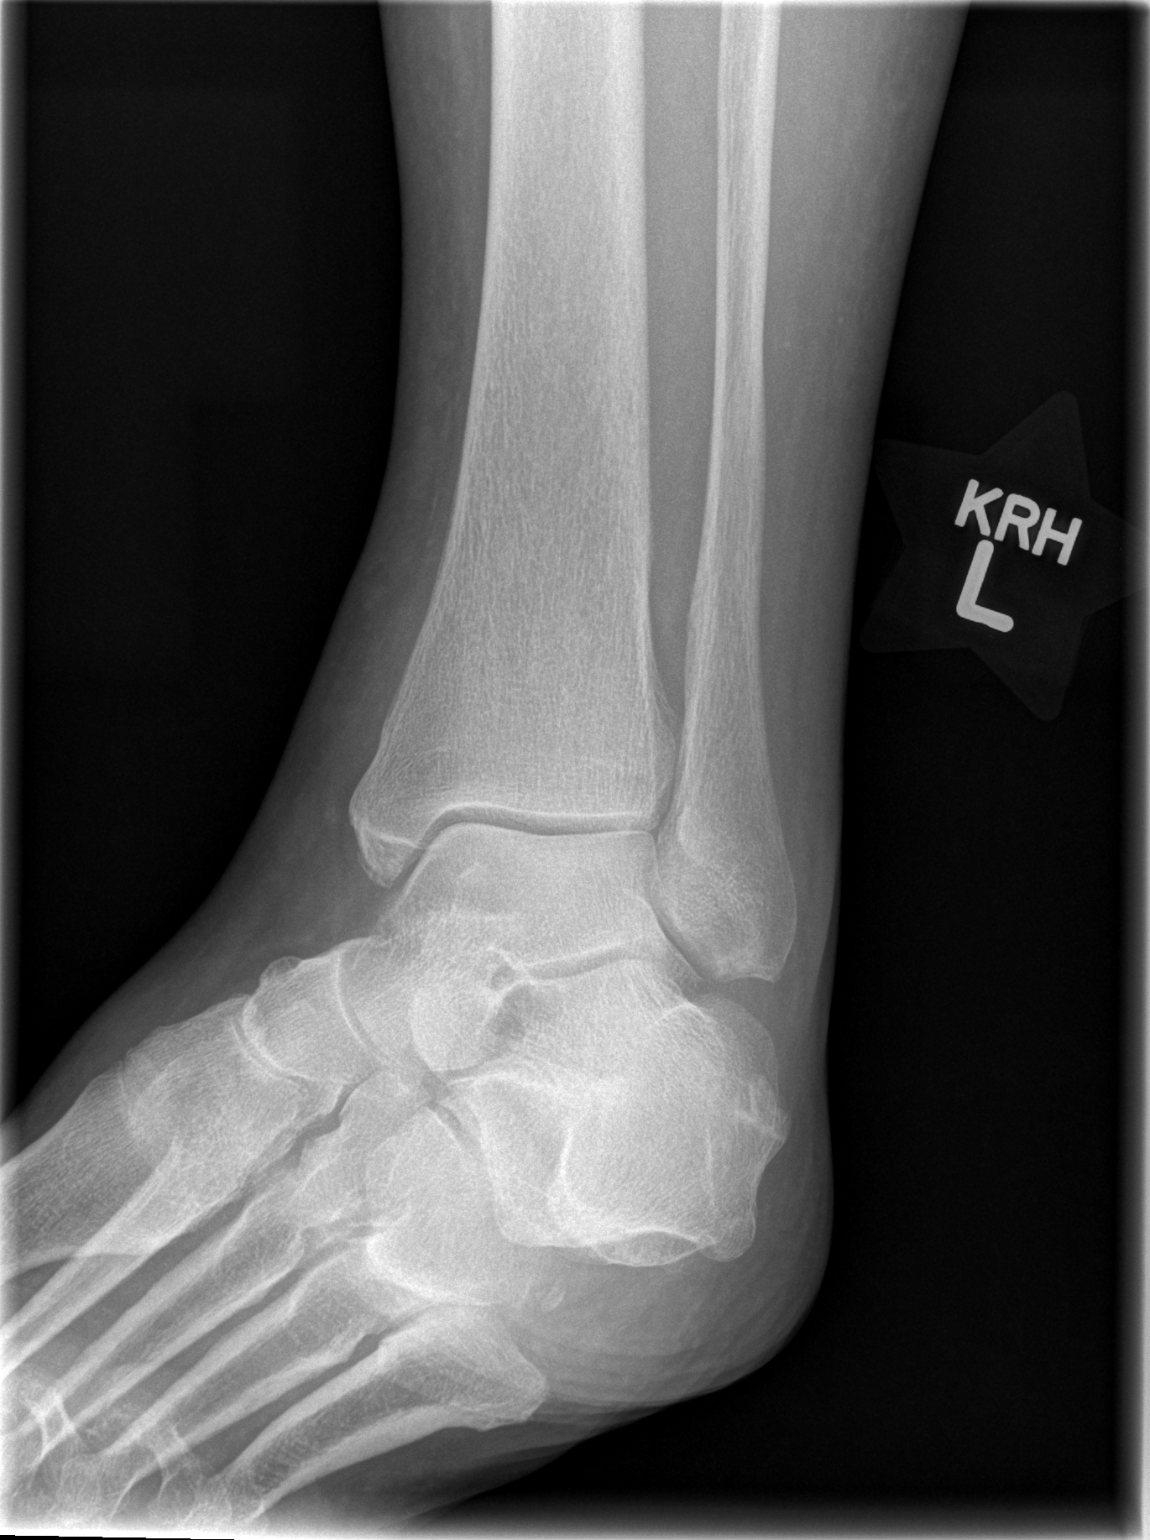

[t ankle joint lat left]
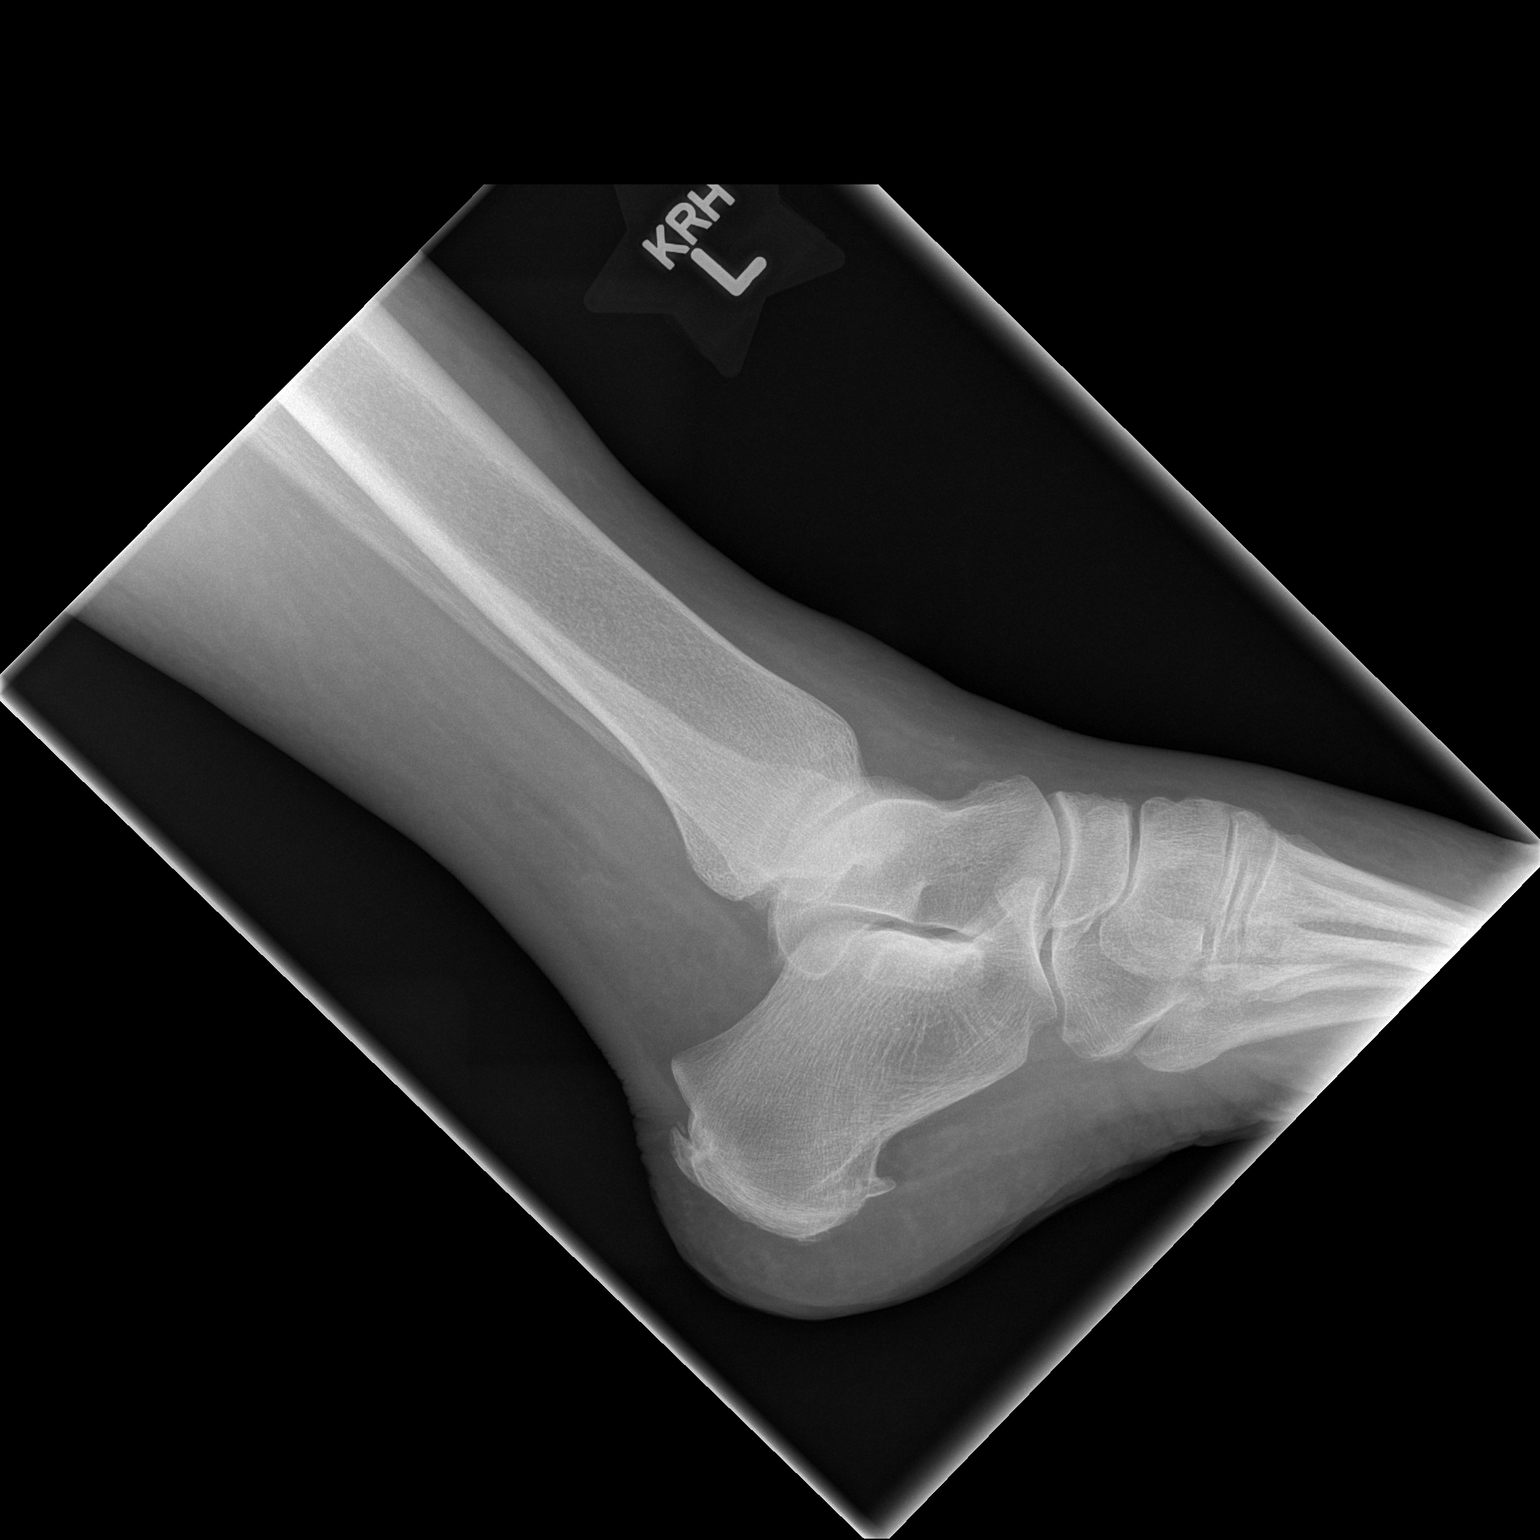

[3 of 3 positions shown; findings below may reference images not displayed]

FINDINGS: Three views of the left ankle submitted.  No acute
fracture or subluxation.  Ankle mortise is preserved.  There is
small plantar spur of the calcaneus.  Posterior spurring of the
calcaneus noted at the Achilles tendon insertion.
IMPRESSION: No acute fracture or subluxation.  Plantar and posterior spurring
of the calcaneus.

## 2013-07-07 ENCOUNTER — Ambulatory Visit: Payer: Medicare Other | Admitting: Cardiology

## 2013-07-20 DIAGNOSIS — Z961 Presence of intraocular lens: Secondary | ICD-10-CM | POA: Diagnosis not present

## 2013-07-28 ENCOUNTER — Ambulatory Visit: Payer: Medicare Other | Admitting: Cardiology

## 2013-08-03 ENCOUNTER — Encounter: Payer: Self-pay | Admitting: Cardiology

## 2013-08-03 ENCOUNTER — Ambulatory Visit (INDEPENDENT_AMBULATORY_CARE_PROVIDER_SITE_OTHER): Payer: Medicare Other | Admitting: Cardiology

## 2013-08-03 VITALS — BP 120/80 | HR 84 | Ht 68.0 in | Wt 236.0 lb

## 2013-08-03 DIAGNOSIS — I4891 Unspecified atrial fibrillation: Secondary | ICD-10-CM | POA: Diagnosis not present

## 2013-08-03 DIAGNOSIS — I10 Essential (primary) hypertension: Secondary | ICD-10-CM

## 2013-08-03 DIAGNOSIS — I5032 Chronic diastolic (congestive) heart failure: Secondary | ICD-10-CM

## 2013-08-03 NOTE — Assessment & Plan Note (Signed)
Patient in atrial fibrillation. Continue Cardizem for rate control. Continue pradaxa; check hemoglobin and renal function.

## 2013-08-03 NOTE — Progress Notes (Signed)
HPI: 79 year old female previously followed by Dr. Daleen Squibb for followup of atrial fibrillation. Patient has a history of amiodarone lung toxicity as well; h/o bradycardia. Echocardiogram in June of 2013 showed normal LV function, mild left trigger hypertrophy, grade 2 diastolic dysfunction, severe pulmonary hypertension and mild left atrial enlargement. Since she was last seen she has dyspnea with more extreme activities. No orthopnea, PND, pedal edema, palpitations, syncope or exertional chest pain.  Current Outpatient Prescriptions  Medication Sig Dispense Refill  . acetaminophen (TYLENOL) 500 MG tablet Take 2 tablets (1,000 mg total) by mouth every 8 (eight) hours as needed for pain (max of 3000 mg in 24 hours).  30 tablet  0  . dabigatran (PRADAXA) 150 MG CAPS TAKE 1 CAPSULE (150 MG TOTAL) BY MOUTH 2 (TWO) TIMES DAILY.  180 capsule  3  . diltiazem (DILACOR XR) 180 MG 24 hr capsule Take 1 capsule (180 mg total) by mouth daily. In am  90 capsule  3  . furosemide (LASIX) 20 MG tablet Take 20 mg by mouth every other day.   30 tablet  3  . Probiotic Product (PROBIOTIC DAILY PO) Take 1 tablet by mouth daily.      . ranitidine (ZANTAC) 150 MG tablet Take 1 tablet (150 mg total) by mouth daily as needed for heartburn.  30 tablet  1  . losartan (COZAAR) 100 MG tablet Take 1 tablet (100 mg total) by mouth daily. In pm  90 tablet  3   No current facility-administered medications for this visit.     Past Medical History  Diagnosis Date  . Anemia   . GERD (gastroesophageal reflux disease)   . Thyroid disease     hypothroidism  . Arthritis     osteo  . Hyperlipidemia   . Vitamin D deficiency   . Chicken pox as a child  . Mumps as a child  . Measles as a child  . Hypertension   . Skin cancer     left temple  . Obese   . Ovarian cyst   . Pelvic mass in female   . Bronchitis 12/25/2011  . Contusion of leg, left 01/16/2012  . Peripheral neuropathy 02/26/2012  . Hyperglycemia 01/10/2010   Qualifier: Diagnosis of  By: Alphonzo Severance MD, Loni Dolly   . Atrial fibrillation     Status post TEE guided DCCV 07/2010:  Transesophageal Echocardiogram 07/2010: EF 60-65% mild MR.;  Amiodarone;  Pradaxa  . Hypoxia 04/14/2012  . Apnea, sleep 04/24/2012  . Hyperthyroidism 02/09/2009    Qualifier: Diagnosis of  By: Alphonzo Severance MD, Loni Dolly   . Intermittent claudication 05/22/2012    Past Surgical History  Procedure Laterality Date  . Cataract extraction      Dr. Burgess Estelle  . Total hip arthroplasty  2003    left  . Abdominal hysterectomy    . Skin biopsy      left temple. cancerous    History   Social History  . Marital Status: Widowed    Spouse Name: N/A    Number of Children: 9  . Years of Education: N/A   Occupational History  .     Social History Main Topics  . Smoking status: Former Smoker -- 0.30 packs/day for 5 years    Types: Cigarettes    Quit date: 10/28/1973  . Smokeless tobacco: Never Used  . Alcohol Use: Yes     Comment: socially  . Drug Use: No  . Sexual Activity: Not  on file   Other Topics Concern  . Not on file   Social History Narrative  . No narrative on file    ROS: no fevers or chills, productive cough, hemoptysis, dysphasia, odynophagia, melena, hematochezia, dysuria, hematuria, rash, seizure activity, orthopnea, PND, pedal edema, claudication. Remaining systems are negative.  Physical Exam: Well-developed well-nourished in no acute distress.  Skin is warm and dry.  HEENT is normal.  Neck is supple.  Chest is clear to auscultation with normal expansion.  Cardiovascular exam is irregular Abdominal exam nontender or distended. No masses palpated. Extremities show no edema. neuro grossly intact  ECG atrial fibrillation at a rate of 84. Prior anterior infarct. Prior inferior infarct.

## 2013-08-03 NOTE — Assessment & Plan Note (Signed)
Symptoms have improved. Continue off of amiodarone.

## 2013-08-03 NOTE — Assessment & Plan Note (Signed)
Blood pressure controlled.continue present medications. 

## 2013-08-03 NOTE — Assessment & Plan Note (Signed)
Continue present dose of Lasix. Check potassium and renal function. 

## 2013-08-03 NOTE — Patient Instructions (Addendum)
Your physician wants you to follow-up in: 6 MONTHS WITH DR CRENSHAW You will receive a reminder letter in the mail two months in advance. If you don't receive a letter, please call our office to schedule the follow-up appointment.   Your physician recommends that you HAVE LAB WORK TODAY 

## 2013-08-04 LAB — CBC WITH DIFFERENTIAL/PLATELET
Basophils Relative: 1 % (ref 0.0–3.0)
Eosinophils Relative: 0.7 % (ref 0.0–5.0)
Lymphocytes Relative: 16.7 % (ref 12.0–46.0)
Neutrophils Relative %: 74.1 % (ref 43.0–77.0)
RBC: 4.69 Mil/uL (ref 3.87–5.11)
WBC: 9.6 10*3/uL (ref 4.5–10.5)

## 2013-08-04 LAB — BASIC METABOLIC PANEL
BUN: 21 mg/dL (ref 6–23)
CO2: 26 mEq/L (ref 19–32)
Calcium: 9.3 mg/dL (ref 8.4–10.5)
Creatinine, Ser: 1.3 mg/dL — ABNORMAL HIGH (ref 0.4–1.2)
GFR: 42.25 mL/min — ABNORMAL LOW (ref >60.00)
Glucose, Bld: 101 mg/dL — ABNORMAL HIGH (ref 70–99)

## 2013-09-06 ENCOUNTER — Telehealth: Payer: Self-pay | Admitting: *Deleted

## 2013-09-06 NOTE — Telephone Encounter (Signed)
Called facility back to inquire as to needing a new study [with results to PCP] for recertification post 07.22.13; was informed that Medicare uses the "old result numbers", forwarded information to Dr. Abner Greenspan and was given [facility] to fax form to requested number [[phone] by caller & [phone] on form] with VO to do updated study w/results forwarded to our office per PCP; caller understood & agreed, form faxed/SLS

## 2013-10-29 ENCOUNTER — Other Ambulatory Visit: Payer: Self-pay | Admitting: Cardiology

## 2013-11-01 ENCOUNTER — Telehealth: Payer: Self-pay | Admitting: Family Medicine

## 2013-11-01 DIAGNOSIS — I10 Essential (primary) hypertension: Secondary | ICD-10-CM

## 2013-11-01 NOTE — Telephone Encounter (Signed)
REFILL-DILTIAZEM 24HR ER 180MG  CAP. TAKE ONE CAPSULE BY MOUTH EVERY DAY. QTY 30 LAST FILL 9.8.14

## 2013-11-02 MED ORDER — DILTIAZEM HCL ER 180 MG PO CP24: 180.0000 mg | ORAL_CAPSULE | Freq: Every day | ORAL | Status: DC

## 2013-11-29 ENCOUNTER — Encounter: Payer: Self-pay | Admitting: Family

## 2013-11-29 ENCOUNTER — Ambulatory Visit (INDEPENDENT_AMBULATORY_CARE_PROVIDER_SITE_OTHER): Payer: Medicare Other | Admitting: Family

## 2013-11-29 VITALS — BP 110/64 | HR 96 | Temp 97.9°F | Resp 12 | Ht 68.0 in | Wt 238.0 lb

## 2013-11-29 DIAGNOSIS — I4891 Unspecified atrial fibrillation: Secondary | ICD-10-CM | POA: Diagnosis not present

## 2013-11-29 MED ORDER — DILTIAZEM HCL ER COATED BEADS 240 MG PO CP24: 240.0000 mg | ORAL_CAPSULE | Freq: Every day | ORAL | Status: DC

## 2013-11-29 NOTE — Progress Notes (Signed)
Pre visit review using our clinic review tool, if applicable. No additional management support is needed unless otherwise documented below in the visit note. 

## 2013-11-29 NOTE — Patient Instructions (Addendum)
Cut your cozaar 100mg  in half and take 1/2 tab (50mg ) once daily. Stop cardizem CD 180 and instead start cardizem CD 240 to help with your heart rate. Follow up in 1 month with Dr. Charlett Blake. You will be contacted about your referral to Dr. Stanford Breed. Go to the ER if you develop heart palpitations, shortness of breath or recurrent chest pain.

## 2013-11-29 NOTE — Assessment & Plan Note (Signed)
EKG obtained and shows atrial fibrillation at a rate of 107. Patient asymptomatic at this time. Decrease losartan to 50mg  daily due to soft BP today. Increase Cardizem to 240mg  daily for atrial fibrillation and rate control. Encouraged patient to follow up with Dr. Stanford Breed soon and to call us or present to the emergency department if she starts to feel worse, develops chest pain/shortness of breath and fatigue.

## 2013-11-29 NOTE — Progress Notes (Signed)
Subjective:    Patient ID: Gina Reilly, female    DOB: August 25, 1924, 80 y.o.   MRN: [id number]  Atrial Fibrillation Symptoms include shortness of breath. Symptoms are negative for chest pain, dizziness and palpitations. Past medical history includes atrial fibrillation.   Gina Reilly is an 80 year old female who presents today with a chief complaint of palpitations and left sided chest pain that began yesterday while doing housework. Patient reports pain subsided yesterday while resting immediately after she experienced these symptoms, and reports pain has not been present since. Today patient denies chest pain, but reports increased belching and has had an increase in gas/belching over past two weeks. Patient reports taking Zantac at bedtime with relief. Patient reports she was d/c'd off Amiodarone two years ago and is concerned she may need to restart. Patient currently follows Dr. Stanford Breed. Patient also noticed swelling to bilateral ankles this morning.   Review of Systems  Respiratory: Positive for shortness of breath. Negative for cough.        Reports SOB upon walking which is her norm. Patient uses oxygen when walking a length of time.  Cardiovascular: Positive for leg swelling. Negative for chest pain and palpitations.  Gastrointestinal: Negative for nausea and vomiting.  Neurological: Negative for dizziness and syncope.   Past Medical History  Diagnosis Date  . Anemia   . GERD (gastroesophageal reflux disease)   . Thyroid disease     hypothroidism  . Arthritis     osteo  . Hyperlipidemia   . Vitamin D deficiency   . Chicken pox as a child  . Mumps as a child  . Measles as a child  . Hypertension   . Skin cancer     left temple  . Obese   . Ovarian cyst   . Pelvic mass in female   . Bronchitis 12/25/2011  . Contusion of leg, left 01/16/2012  . Peripheral neuropathy 02/26/2012  . Hyperglycemia 01/10/2010    Qualifier: Diagnosis of  By: Niel Hummer MD, [place] Atrial  fibrillation     Status post TEE guided DCCV 07/2010:  Transesophageal Echocardiogram 07/2010: EF 60-65% mild MR.;  Amiodarone;  Pradaxa  . Hypoxia 04/14/2012  . Apnea, sleep 04/24/2012  . Hyperthyroidism 02/09/2009    Qualifier: Diagnosis of  By: Niel Hummer MD, [place] Intermittent claudication 05/22/2012    History   Social History  . Marital Status: Widowed    Spouse Name: N/A    Number of Children: 18  . Years of Education: N/A   Occupational History  .     Social History Main Topics  . Smoking status: Former Smoker -- 0.30 packs/day for 5 years    Types: Cigarettes    Quit date: 10/28/1973  . Smokeless tobacco: Never Used  . Alcohol Use: Yes     Comment: socially  . Drug Use: No  . Sexual Activity: Not on file   Other Topics Concern  . Not on file   Social History Narrative  . No narrative on file    Past Surgical History  Procedure Laterality Date  . Cataract extraction      Dr. Satira Sark  . Total hip arthroplasty  2003    left  . Abdominal hysterectomy    . Skin biopsy      left temple. cancerous    Family History  Problem Relation Age of Onset  . Stroke Mother   . Cancer Father  prostate  . Hypertension Brother   . Cancer Maternal Grandmother   . Hypertension Brother   . Heart disease Brother   . Obesity Brother   . Hypertension Brother   . Hyperlipidemia Brother   . Heart disease Brother   . Kidney disease Brother   . Cancer Daughter     kidney- spine and blood stream  . Heart disease Son     valvular heart disease    Allergies  Allergen Reactions  . Amiodarone Other (See Comments)    Affecting the Lungs  . Aspercreme [Trolamine Salicylate]     Current Outpatient Prescriptions on File Prior to Visit  Medication Sig Dispense Refill  . acetaminophen (TYLENOL) 500 MG tablet Take 2 tablets (1,000 mg total) by mouth every 8 (eight) hours as needed for pain (max of 3000 mg in 24 hours).  30 tablet  0  . dabigatran (PRADAXA) 150 MG CAPS  TAKE 1 CAPSULE (150 MG TOTAL) BY MOUTH 2 (TWO) TIMES DAILY.  180 capsule  3  . Probiotic Product (PROBIOTIC DAILY PO) Take 1 tablet by mouth daily.      . ranitidine (ZANTAC) 150 MG tablet Take 1 tablet (150 mg total) by mouth daily as needed for heartburn.  30 tablet  1  . furosemide (LASIX) 20 MG tablet Take 20 mg by mouth every other day.   30 tablet  3   No current facility-administered medications on file prior to visit.    BP 110/64  Pulse 96  Temp(Src) 97.9 F (36.6 C) (Oral)  Resp 12  Ht 5\' 8"  (1.727 m)  Wt 238 lb 0.6 oz (107.974 kg)  BMI 36.20 kg/m2  SpO2 96%  LMP 12/25/2011       Objective:   Physical Exam  Constitutional: She is oriented to person, place, and time. She appears well-nourished.  HENT:  [place]: Normocephalic.  Cardiovascular: Normal rate and intact distal pulses.  An irregularly irregular rhythm present. PMI is not displaced.   Pulses:      Radial pulses are 2+ on the right side, and 2+ on the left side.       Dorsalis pedis pulses are 2+ on the right side, and 2+ on the left side.       Posterior tibial pulses are 2+ on the right side, and 2+ on the left side.  Neurological: She is alert and oriented to person, place, and time.  Skin: Skin is warm and dry.          Assessment & Plan:  I have personally seen and examined patient and agree with Gina Friendly NP student's assessment and plan.  Case reviewed with Dr. Charlett Blake and she is in agreement with med changes.

## 2013-12-22 ENCOUNTER — Ambulatory Visit: Payer: Medicare Other | Admitting: Cardiology

## 2013-12-31 DIAGNOSIS — R946 Abnormal results of thyroid function studies: Secondary | ICD-10-CM | POA: Diagnosis not present

## 2014-01-06 DIAGNOSIS — R946 Abnormal results of thyroid function studies: Secondary | ICD-10-CM | POA: Diagnosis not present

## 2014-01-19 ENCOUNTER — Encounter: Payer: Self-pay | Admitting: Cardiology

## 2014-01-19 ENCOUNTER — Ambulatory Visit (INDEPENDENT_AMBULATORY_CARE_PROVIDER_SITE_OTHER): Payer: Medicare Other | Admitting: Cardiology

## 2014-01-19 VITALS — BP 148/80 | HR 94 | Ht 68.0 in | Wt 235.0 lb

## 2014-01-19 DIAGNOSIS — I4891 Unspecified atrial fibrillation: Secondary | ICD-10-CM

## 2014-01-19 DIAGNOSIS — J708 Respiratory conditions due to other specified external agents: Secondary | ICD-10-CM

## 2014-01-19 DIAGNOSIS — I5032 Chronic diastolic (congestive) heart failure: Secondary | ICD-10-CM

## 2014-01-19 DIAGNOSIS — T462X5A Adverse effect of other antidysrhythmic drugs, initial encounter: Secondary | ICD-10-CM

## 2014-01-19 DIAGNOSIS — J984 Other disorders of lung: Secondary | ICD-10-CM

## 2014-01-19 NOTE — Assessment & Plan Note (Signed)
euvolemic at present.

## 2014-01-19 NOTE — Assessment & Plan Note (Signed)
Blood pressure controlled. Continue present medications. 

## 2014-01-19 NOTE — Assessment & Plan Note (Signed)
Continue Cardizem for rate control. We plan rate control and anticoagulation. Schedule 24-hour Holter monitor to make sure that her rate is adequately controlled. Continue pradaxa. Check renal function and hemoglobin.

## 2014-01-19 NOTE — Assessment & Plan Note (Signed)
Improving off of amiodarone.

## 2014-01-19 NOTE — Progress Notes (Signed)
HPI: FU atrial fibrillation. Patient has a history of amiodarone lung toxicity as well; h/o bradycardia. Echocardiogram in June of 2013 showed normal LV function, mild left ventricular hypertrophy, grade 2 diastolic dysfunction, severe pulmonary hypertension and mild left atrial enlargement. Since she was last seen in Oct 2014 she has dyspnea with more extreme activities. No orthopnea, PND, palpitations, syncope or exertional chest pain. Occasional minimal pedal edema.   Current Outpatient Prescriptions  Medication Sig Dispense Refill  . acetaminophen (TYLENOL) 500 MG tablet Take 2 tablets (1,000 mg total) by mouth every 8 (eight) hours as needed for pain (max of 3000 mg in 24 hours).  30 tablet  0  . dabigatran (PRADAXA) 150 MG CAPS TAKE 1 CAPSULE (150 MG TOTAL) BY MOUTH 2 (TWO) TIMES DAILY.  180 capsule  3  . diltiazem (CARDIZEM CD) 240 MG 24 hr capsule Take 1 capsule (240 mg total) by mouth daily.  30 capsule  1  . losartan (COZAAR) 100 MG tablet Take 50 mg by mouth daily. In pm      . Probiotic Product (PROBIOTIC DAILY PO) Take 1 tablet by mouth daily.      . ranitidine (ZANTAC) 150 MG tablet Take 1 tablet (150 mg total) by mouth daily as needed for heartburn.  30 tablet  1   No current facility-administered medications for this visit.     Past Medical History  Diagnosis Date  . Anemia   . GERD (gastroesophageal reflux disease)   . Thyroid disease     hypothroidism  . Arthritis     osteo  . Hyperlipidemia   . Vitamin D deficiency   . Chicken pox as a child  . Mumps as a child  . Measles as a child  . Hypertension   . Skin cancer     left temple  . Obese   . Ovarian cyst   . Pelvic mass in female   . Bronchitis 12/25/2011  . Contusion of leg, left 01/16/2012  . Peripheral neuropathy 02/26/2012  . Hyperglycemia 01/10/2010    Qualifier: Diagnosis of  By: Niel Hummer MD, [place] Atrial fibrillation     Status post TEE guided DCCV 07/2010:  Transesophageal  Echocardiogram 07/2010: EF 60-65% mild MR.;  Amiodarone;  Pradaxa  . Hypoxia 04/14/2012  . Apnea, sleep 04/24/2012  . Hyperthyroidism 02/09/2009    Qualifier: Diagnosis of  By: Niel Hummer MD, [place] Intermittent claudication 05/22/2012    Past Surgical History  Procedure Laterality Date  . Cataract extraction      Dr. Satira Sark  . Total hip arthroplasty  2003    left  . Abdominal hysterectomy    . Skin biopsy      left temple. cancerous    History   Social History  . Marital Status: Widowed    Spouse Name: N/A    Number of Children: 39  . Years of Education: N/A   Occupational History  .     Social History Main Topics  . Smoking status: Former Smoker -- 0.30 packs/day for 5 years    Types: Cigarettes    Quit date: 10/28/1973  . Smokeless tobacco: Never Used  . Alcohol Use: Yes     Comment: socially  . Drug Use: No  . Sexual Activity: Not on file   Other Topics Concern  . Not on file   Social History Narrative  . No narrative on file    ROS: no  fevers or chills, productive cough, hemoptysis, dysphasia, odynophagia, melena, hematochezia, dysuria, hematuria, rash, seizure activity, orthopnea, PND, claudication. Remaining systems are negative.  Physical Exam: Well-developed well-nourished in no acute distress.  Skin is warm and dry.  HEENT is normal.  Neck is supple.  Chest is clear to auscultation with normal expansion.  Cardiovascular exam is irregular Abdominal exam nontender or distended. No masses palpated. Extremities show trace ankle edema. neuro grossly intact  ECG 11/29/2013-atrial fibrillation, cannot rule out prior inferior infarct.

## 2014-01-19 NOTE — Patient Instructions (Signed)
Your physician wants you to follow-up in: [place] will receive a reminder letter in the mail two months in advance. If you don't receive a letter, please call our office to schedule the follow-up appointment.   Your physician has recommended that you wear a 24 HOUR holter monitor. Holter monitors are medical devices that record the heart's electrical activity. Doctors most often use these monitors to diagnose arrhythmias. Arrhythmias are problems with the speed or rhythm of the heartbeat. The monitor is a small, portable device. You can wear one while you do your normal daily activities. This is usually used to diagnose what is causing palpitations/syncope (passing out).   Your physician recommends that you return for lab work Gina Reilly

## 2014-01-21 ENCOUNTER — Other Ambulatory Visit: Payer: Self-pay | Admitting: Family

## 2014-01-21 NOTE — Telephone Encounter (Signed)
Diltiazem refill sent to pharmacy. Pt is due for follow up with Dr Charlett Blake now. Please call pt to arrange.

## 2014-01-24 NOTE — Telephone Encounter (Signed)
Left detailed message informing patient of medication refill and that she is due now for an appointment.

## 2014-01-26 NOTE — Telephone Encounter (Signed)
Left message for patient to return my call.

## 2014-01-27 NOTE — Telephone Encounter (Signed)
Left message for patient to return my call.

## 2014-02-08 ENCOUNTER — Other Ambulatory Visit: Payer: Medicare Other

## 2014-02-09 ENCOUNTER — Encounter: Payer: Self-pay | Admitting: *Deleted

## 2014-02-09 ENCOUNTER — Encounter (INDEPENDENT_AMBULATORY_CARE_PROVIDER_SITE_OTHER): Payer: Medicare Other

## 2014-02-09 ENCOUNTER — Other Ambulatory Visit (INDEPENDENT_AMBULATORY_CARE_PROVIDER_SITE_OTHER): Payer: Medicare Other

## 2014-02-09 DIAGNOSIS — I4891 Unspecified atrial fibrillation: Secondary | ICD-10-CM

## 2014-02-09 LAB — CBC WITH DIFFERENTIAL/PLATELET
Basophils Absolute: 0 10*3/uL (ref 0.0–0.1)
Basophils Relative: 0.3 % (ref 0.0–3.0)
EOS PCT: 0.7 % (ref 0.0–5.0)
Eosinophils Absolute: 0.1 10*3/uL (ref 0.0–0.7)
HCT: 44.9 % (ref 36.0–46.0)
Hemoglobin: 15.1 g/dL — ABNORMAL HIGH (ref 12.0–15.0)
LYMPHS PCT: 15.5 % (ref 12.0–46.0)
Lymphs Abs: 1.3 10*3/uL (ref 0.7–4.0)
MCHC: 33.6 g/dL (ref 30.0–36.0)
MCV: 94.6 fl (ref 78.0–100.0)
Monocytes Absolute: 0.7 10*3/uL (ref 0.1–1.0)
Monocytes Relative: 8 % (ref 3.0–12.0)
NEUTROS PCT: 75.5 % (ref 43.0–77.0)
Neutro Abs: 6.4 10*3/uL (ref 1.4–7.7)
Platelets: 234 10*3/uL (ref 150.0–400.0)
RBC: 4.74 Mil/uL (ref 3.87–5.11)
RDW: 13.6 % (ref 11.5–14.6)
WBC: 8.5 10*3/uL (ref 4.5–10.5)

## 2014-02-09 LAB — BASIC METABOLIC PANEL
BUN: 16 mg/dL (ref 6–23)
CHLORIDE: 104 meq/L (ref 96–112)
CO2: 27 mEq/L (ref 19–32)
Calcium: 9.5 mg/dL (ref 8.4–10.5)
Creatinine, Ser: 0.9 mg/dL (ref 0.4–1.2)
GFR: 65.29 mL/min (ref >60.00)
Glucose, Bld: 95 mg/dL (ref 70–99)
Potassium: 4.4 mEq/L (ref 3.5–5.1)
Sodium: 138 mEq/L (ref 135–145)

## 2014-02-09 NOTE — Progress Notes (Signed)
Patient ID: Gina Reilly, female   DOB: 02-16-1924, 80 y.o.   MRN: [id number] E-[employer] 24 hour holter monitor applied to patient.

## 2014-02-21 ENCOUNTER — Telehealth: Payer: Self-pay | Admitting: *Deleted

## 2014-02-21 MED ORDER — DILTIAZEM HCL ER COATED BEADS 300 MG PO CP24: 300.0000 mg | ORAL_CAPSULE | Freq: Every day | ORAL | Status: DC

## 2014-02-21 NOTE — Telephone Encounter (Signed)
Monitor reviewed by dr Stanford Breed shows atrial fib with pvc's or aberrantly conducted beats; rate mildly elevated. Pt to change cardizem to 300 mg once daily Patient voiced understanding

## 2014-02-28 ENCOUNTER — Other Ambulatory Visit: Payer: Self-pay | Admitting: Cardiology

## 2014-03-01 ENCOUNTER — Ambulatory Visit (INDEPENDENT_AMBULATORY_CARE_PROVIDER_SITE_OTHER): Payer: Medicare Other | Admitting: Family Medicine

## 2014-03-01 ENCOUNTER — Encounter: Payer: Self-pay | Admitting: Family Medicine

## 2014-03-01 VITALS — BP 120/82 | HR 84 | Temp 98.1°F | Ht 68.0 in | Wt 235.0 lb

## 2014-03-01 DIAGNOSIS — J708 Respiratory conditions due to other specified external agents: Secondary | ICD-10-CM

## 2014-03-01 DIAGNOSIS — E785 Hyperlipidemia, unspecified: Secondary | ICD-10-CM

## 2014-03-01 DIAGNOSIS — J984 Other disorders of lung: Secondary | ICD-10-CM

## 2014-03-01 DIAGNOSIS — G4730 Sleep apnea, unspecified: Secondary | ICD-10-CM

## 2014-03-01 DIAGNOSIS — M1990 Unspecified osteoarthritis, unspecified site: Secondary | ICD-10-CM

## 2014-03-01 DIAGNOSIS — I10 Essential (primary) hypertension: Secondary | ICD-10-CM

## 2014-03-01 DIAGNOSIS — T462X5A Adverse effect of other antidysrhythmic drugs, initial encounter: Secondary | ICD-10-CM

## 2014-03-01 NOTE — Progress Notes (Signed)
Pre visit review using our clinic review tool, if applicable. No additional management support is needed unless otherwise documented below in the visit note. 

## 2014-03-01 NOTE — Patient Instructions (Signed)

## 2014-03-01 NOTE — Progress Notes (Signed)
Patient ID: Gina Reilly, female   DOB: January 20, 1924, 80 y.o.   MRN: [id number] Gina Reilly [id number] [birth year] 03/01/2014      Progress Note-Follow Up  Subjective  Chief Complaint  Chief Complaint  Patient presents with  . Follow-up    on halter    HPI   Patient is a 80 year old female in today for routine medical care. Is doing well. She is using her CPAP daily and says she finds it helpful. She is less tired. She's not had any recent trouble with palpitations or chest pain. She does struggle with some stiffness and joint pain. No fevers or other acute complaints. Denies CP/palp/SOB/HA/congestion/fevers/GI or GU c/o. Taking meds as prescribed  Past Medical History  Diagnosis Date  . Anemia   . GERD (gastroesophageal reflux disease)   . Thyroid disease     hypothroidism  . Arthritis     osteo  . Hyperlipidemia   . Vitamin D deficiency   . Chicken pox as a child  . Mumps as a child  . Measles as a child  . Hypertension   . Skin cancer     left temple  . Obese   . Ovarian cyst   . Pelvic mass in female   . Bronchitis 12/25/2011  . Contusion of leg, left 01/16/2012  . Peripheral neuropathy 02/26/2012  . Hyperglycemia 01/10/2010    Qualifier: Diagnosis of  By: Niel Hummer MD, [place] Atrial fibrillation     Status post TEE guided DCCV 07/2010:  Transesophageal Echocardiogram 07/2010: EF 60-65% mild MR.;  Amiodarone;  Pradaxa  . Hypoxia 04/14/2012  . Apnea, sleep 04/24/2012  . Hyperthyroidism 02/09/2009    Qualifier: Diagnosis of  By: Niel Hummer MD, [place] Intermittent claudication 05/22/2012    Past Surgical History  Procedure Laterality Date  . Cataract extraction      Dr. Satira Sark  . Total hip arthroplasty  2003    left  . Abdominal hysterectomy    . Skin biopsy      left temple. cancerous    Family History  Problem Relation Age of Onset  . Stroke Mother   . Cancer Father     prostate  . Hypertension Brother   . Cancer Maternal Grandmother   .  Hypertension Brother   . Heart disease Brother   . Obesity Brother   . Hypertension Brother   . Hyperlipidemia Brother   . Heart disease Brother   . Kidney disease Brother   . Cancer Daughter     kidney- spine and blood stream  . Heart disease Son     valvular heart disease    History   Social History  . Marital Status: Widowed    Spouse Name: N/A    Number of Children: 74  . Years of Education: N/A   Occupational History  .     Social History Main Topics  . Smoking status: Former Smoker -- 0.30 packs/day for 5 years    Types: Cigarettes    Quit date: 10/28/1973  . Smokeless tobacco: Never Used  . Alcohol Use: Yes     Comment: socially  . Drug Use: No  . Sexual Activity: Not on file   Other Topics Concern  . Not on file   Social History Narrative  . No narrative on file    Current Outpatient Prescriptions on File Prior to Visit  Medication Sig Dispense Refill  . acetaminophen (TYLENOL) 500 MG  tablet Take 2 tablets (1,000 mg total) by mouth every 8 (eight) hours as needed for pain (max of 3000 mg in 24 hours).  30 tablet  0  . diltiazem (CARDIZEM CD) 300 MG 24 hr capsule Take 1 capsule (300 mg total) by mouth daily.  90 capsule  3  . losartan (COZAAR) 100 MG tablet Take 50 mg by mouth daily. In pm      . PRADAXA 150 MG CAPS capsule TAKE 1 CAPSULE TWO TIMES DAILY  60 capsule  3  . Probiotic Product (PROBIOTIC DAILY PO) Take 1 tablet by mouth daily.      . ranitidine (ZANTAC) 150 MG tablet Take 1 tablet (150 mg total) by mouth daily as needed for heartburn.  30 tablet  1   No current facility-administered medications on file prior to visit.    Allergies  Allergen Reactions  . Amiodarone Other (See Comments)    Affecting the Lungs  . Aspercreme [Trolamine Salicylate]     Review of Systems  Review of Systems  Constitutional: Negative for fever and malaise/fatigue.  HENT: Negative for congestion.   Eyes: Negative for discharge.  Respiratory: Negative for  shortness of breath.   Cardiovascular: Negative for chest pain, palpitations and leg swelling.  Gastrointestinal: Negative for nausea, abdominal pain and diarrhea.  Genitourinary: Negative for dysuria.  Musculoskeletal: Negative for falls.  Skin: Negative for rash.  Neurological: Negative for loss of consciousness and headaches.  Endo/Heme/Allergies: Negative for polydipsia.  Psychiatric/Behavioral: Negative for depression and suicidal ideas. The patient is not nervous/anxious and does not have insomnia.     Objective  BP 120/82  Pulse 84  Temp(Src) 98.1 F (36.7 C) (Oral)  Ht 5\' 8"  (1.727 m)  Wt 235 lb 0.6 oz (106.613 kg)  BMI 35.75 kg/m2  SpO2 95%  LMP 12/25/2011  Physical Exam  Physical Exam  Constitutional: She is oriented to person, place, and time and well-developed, well-nourished, and in no distress. No distress.  HENT:  [place]: Normocephalic and atraumatic.  Eyes: Conjunctivae are normal.  Neck: Neck supple. No thyromegaly present.  Cardiovascular: Normal rate, regular rhythm and normal heart sounds.   No murmur heard. Pulmonary/Chest: Effort normal and breath sounds normal. She has no wheezes.  Abdominal: She exhibits no distension and no mass.  Musculoskeletal: She exhibits no edema.  Lymphadenopathy:    She has no cervical adenopathy.  Neurological: She is alert and oriented to person, place, and time.  Skin: Skin is warm and dry. No rash noted. She is not diaphoretic.  Psychiatric: Memory, affect and judgment normal.    Lab Results  Component Value Date   TSH 1.27 03/10/2013   Lab Results  Component Value Date   WBC 8.5 02/09/2014   HGB 15.1* 02/09/2014   HCT 44.9 02/09/2014   MCV 94.6 02/09/2014   PLT 234.0 02/09/2014   Lab Results  Component Value Date   CREATININE 0.9 02/09/2014   BUN 16 02/09/2014   NA 138 02/09/2014   K 4.4 02/09/2014   CL 104 02/09/2014   CO2 27 02/09/2014   Lab Results  Component Value Date   ALT 13 03/10/2013   AST 16 03/10/2013    ALKPHOS 65 03/10/2013   BILITOT 0.6 03/10/2013   Lab Results  Component Value Date   CHOL 161 01/08/2012   Lab Results  Component Value Date   HDL 59.70 01/08/2012   Lab Results  Component Value Date   LDLCALC 86 01/08/2012   Lab Results  Component  Value Date   TRIG 77.0 01/08/2012   Lab Results  Component Value Date   CHOLHDL 3 01/08/2012     Assessment & Plan  Hypertension Well controlled, no changes to meds. Encouraged heart healthy diet such as the DASH diet and exercise as tolerated.   HYPERLIPIDEMIA Encouraged heart healthy diet, increase exercise, avoid trans fats, consider a krill oil cap daily  Apnea, sleep Using CPAP daily and feels better as a result  Amiodarone pulmonary toxicity Dyspnea has been improving off of Amiodarone  OSTEOARTHRITIS Try Tylenol 2 tabs qhs prn pain

## 2014-03-02 ENCOUNTER — Other Ambulatory Visit: Payer: Self-pay | Admitting: *Deleted

## 2014-03-02 MED ORDER — DABIGATRAN ETEXILATE MESYLATE 150 MG PO CAPS: ORAL_CAPSULE | ORAL | Status: DC

## 2014-03-06 ENCOUNTER — Encounter: Payer: Self-pay | Admitting: Family Medicine

## 2014-03-06 NOTE — Assessment & Plan Note (Signed)
Try Tylenol 2 tabs qhs prn pain

## 2014-03-06 NOTE — Assessment & Plan Note (Signed)
Well controlled, no changes to meds. Encouraged heart healthy diet such as the DASH diet and exercise as tolerated.  °

## 2014-03-06 NOTE — Assessment & Plan Note (Signed)
Dyspnea has been improving off of Amiodarone

## 2014-03-06 NOTE — Assessment & Plan Note (Signed)
Using CPAP daily and feels better as a result

## 2014-03-06 NOTE — Assessment & Plan Note (Signed)
Encouraged heart healthy diet, increase exercise, avoid trans fats, consider a krill oil cap daily 

## 2014-03-07 ENCOUNTER — Telehealth: Payer: Self-pay | Admitting: Family Medicine

## 2014-03-07 NOTE — Telephone Encounter (Signed)
The cardizem is working so well for her heart condition that I would prefer not to mess with it and address the constipation itself first. First she needs hi fiber diet and plenty of clear fluids. Add a probiotic such as Digestive Advantage. Fiber supplement such as Benefiber daily and if no response then should add Pericolace (or Senna S is essentially the same thing) 1-2 daily. If no improvement let me know. If we have to change the Cardizem we will have to get cardiology involved

## 2014-03-07 NOTE — Telephone Encounter (Signed)
Patient states that she cannot tolerate diltiazem because the medicine is giving her constipation. She says that she had to take an enemia on Saturday night and wants to know if there is something else she can take?

## 2014-03-08 NOTE — Telephone Encounter (Signed)
I tried to call and inform pt. Phone just rings. I will try again later

## 2014-03-08 NOTE — Telephone Encounter (Signed)
Spoke with the patient and read her what Dr b said  She expressed understanding

## 2014-03-09 ENCOUNTER — Telehealth: Payer: Self-pay | Admitting: Family Medicine

## 2014-03-09 DIAGNOSIS — R609 Edema, unspecified: Secondary | ICD-10-CM

## 2014-03-09 NOTE — Telephone Encounter (Signed)
Refill furosemide 20 mg tablet take 1 tablet by mouth every day last fill 02-09-2014 qty 30 [facility] [address] [employer] [place]

## 2014-03-10 MED ORDER — FUROSEMIDE 20 MG PO TABS: 20.0000 mg | ORAL_TABLET | ORAL | Status: DC

## 2014-03-10 NOTE — Telephone Encounter (Signed)
OK to refill lasix with same sig, same strength and disp #30 with 5 rf

## 2014-03-10 NOTE — Telephone Encounter (Signed)
Please advise Furosemide refill? It doesn't look like this has been ordered in awhile?

## 2014-03-18 ENCOUNTER — Encounter: Payer: Self-pay | Admitting: Cardiology

## 2014-05-30 ENCOUNTER — Ambulatory Visit: Payer: Medicare Other | Admitting: Family Medicine

## 2014-06-20 ENCOUNTER — Ambulatory Visit (INDEPENDENT_AMBULATORY_CARE_PROVIDER_SITE_OTHER): Payer: Medicare Other | Admitting: Family Medicine

## 2014-06-20 ENCOUNTER — Encounter: Payer: Self-pay | Admitting: Family Medicine

## 2014-06-20 ENCOUNTER — Telehealth: Payer: Self-pay | Admitting: Family Medicine

## 2014-06-20 ENCOUNTER — Telehealth: Payer: Self-pay

## 2014-06-20 VITALS — BP 110/82 | HR 81 | Temp 98.2°F | Ht 68.0 in | Wt 231.0 lb

## 2014-06-20 DIAGNOSIS — I10 Essential (primary) hypertension: Secondary | ICD-10-CM

## 2014-06-20 DIAGNOSIS — E0590 Thyrotoxicosis, unspecified without thyrotoxic crisis or storm: Secondary | ICD-10-CM

## 2014-06-20 DIAGNOSIS — E559 Vitamin D deficiency, unspecified: Secondary | ICD-10-CM

## 2014-06-20 DIAGNOSIS — I4891 Unspecified atrial fibrillation: Secondary | ICD-10-CM

## 2014-06-20 DIAGNOSIS — E785 Hyperlipidemia, unspecified: Secondary | ICD-10-CM

## 2014-06-20 DIAGNOSIS — G4730 Sleep apnea, unspecified: Secondary | ICD-10-CM

## 2014-06-20 DIAGNOSIS — E669 Obesity, unspecified: Secondary | ICD-10-CM

## 2014-06-20 DIAGNOSIS — R739 Hyperglycemia, unspecified: Secondary | ICD-10-CM

## 2014-06-20 NOTE — Assessment & Plan Note (Signed)
Not using CPAP for past month, only O2 and feels better. Agrees to reevaluation by pulmonology before deciding about long term use of CPAP

## 2014-06-20 NOTE — Assessment & Plan Note (Signed)
Encouraged heart healthy diet, increase exercise, avoid trans fats, consider a krill oil cap daily 

## 2014-06-20 NOTE — Progress Notes (Signed)
Patient ID: Gina Reilly, female   DOB: [birth year], 80 y.o.   MRN: [id number] Gina Reilly [id number] 1924/06/09 06/20/2014      Progress Note-Follow Up  Subjective  Chief Complaint  Chief Complaint  Patient presents with  . Follow-up     3 month    HPI  Patient is a 80 year old female in today for routine medical care. Doing very well. No recent illness. Has felt much better ever since stopping her amiodarone. No cough or dyspnea. Is very active and keeping up with grandchildren and great grandchildren very well. Did stop using her CPAP machine about a month ago for no obvious reason. Does feel she sleeps better without it. Has been using oxygen at night and wakes up feeling rested. Denies CP/palp/SOB/HA/congestion/fevers/GI or GU c/o. Taking meds as prescribed  Past Medical History  Diagnosis Date  . Anemia   . GERD (gastroesophageal reflux disease)   . Thyroid disease     hypothroidism  . Arthritis     osteo  . Hyperlipidemia   . Vitamin D deficiency   . Chicken pox as a child  . Mumps as a child  . Measles as a child  . Hypertension   . Skin cancer     left temple  . Obese   . Ovarian cyst   . Pelvic mass in female   . Bronchitis 12/25/2011  . Contusion of leg, left 01/16/2012  . Peripheral neuropathy 02/26/2012  . Hyperglycemia 01/10/2010    Qualifier: Diagnosis of  By: Niel Hummer MD, [place] Atrial fibrillation     Status post TEE guided DCCV 07/2010:  Transesophageal Echocardiogram 07/2010: EF 60-65% mild MR.;  Amiodarone;  Pradaxa  . Hypoxia 04/14/2012  . Apnea, sleep 04/24/2012  . Hyperthyroidism 02/09/2009    Qualifier: Diagnosis of  By: Niel Hummer MD, [place] Intermittent claudication 05/22/2012    Past Surgical History  Procedure Laterality Date  . Cataract extraction      Dr. Satira Sark  . Total hip arthroplasty  2003    left  . Abdominal hysterectomy    . Skin biopsy      left temple. cancerous    Family History  Problem Relation Age of Onset   . Stroke Mother   . Cancer Father     prostate  . Hypertension Brother   . Cancer Maternal Grandmother   . Hypertension Brother   . Heart disease Brother   . Obesity Brother   . Hypertension Brother   . Hyperlipidemia Brother   . Heart disease Brother   . Kidney disease Brother   . Cancer Daughter     kidney- spine and blood stream  . Heart disease Son     valvular heart disease    History   Social History  . Marital Status: Widowed    Spouse Name: N/A    Number of Children: 29  . Years of Education: N/A   Occupational History  .     Social History Main Topics  . Smoking status: Former Smoker -- 0.30 packs/day for 5 years    Types: Cigarettes    Quit date: 10/28/1973  . Smokeless tobacco: Never Used  . Alcohol Use: Yes     Comment: socially  . Drug Use: No  . Sexual Activity: Not on file   Other Topics Concern  . Not on file   Social History Narrative  . No narrative on file  Current Outpatient Prescriptions on File Prior to Visit  Medication Sig Dispense Refill  . acetaminophen (TYLENOL) 500 MG tablet Take 2 tablets (1,000 mg total) by mouth every 8 (eight) hours as needed for pain (max of 3000 mg in 24 hours).  30 tablet  0  . dabigatran (PRADAXA) 150 MG CAPS capsule TAKE 1 CAPSULE TWO TIMES DAILY  180 capsule  1  . diltiazem (CARDIZEM CD) 300 MG 24 hr capsule Take 1 capsule (300 mg total) by mouth daily.  90 capsule  3  . losartan (COZAAR) 100 MG tablet Take 50 mg by mouth daily. In pm      . Probiotic Product (PROBIOTIC DAILY PO) Take 1 tablet by mouth daily.      . ranitidine (ZANTAC) 150 MG tablet Take 1 tablet (150 mg total) by mouth daily as needed for heartburn.  30 tablet  1   No current facility-administered medications on file prior to visit.    Allergies  Allergen Reactions  . Amiodarone Other (See Comments)    Affecting the Lungs  . Aspercreme [Trolamine Salicylate]     Review of Systems  Review of Systems  Constitutional: Negative  for fever and malaise/fatigue.  HENT: Negative for congestion.   Eyes: Negative for discharge.  Respiratory: Negative for shortness of breath.   Cardiovascular: Negative for chest pain, palpitations and leg swelling.  Gastrointestinal: Negative for nausea, abdominal pain and diarrhea.  Genitourinary: Negative for dysuria.  Musculoskeletal: Negative for falls.  Skin: Negative for rash.  Neurological: Negative for loss of consciousness and headaches.  Endo/Heme/Allergies: Negative for polydipsia.  Psychiatric/Behavioral: Negative for depression and suicidal ideas. The patient is not nervous/anxious and does not have insomnia.     Objective  BP 110/82  Pulse 81  Temp(Src) 98.2 F (36.8 C) (Oral)  Ht 5\' 8"  (1.727 m)  Wt 231 lb (104.781 kg)  BMI 35.13 kg/m2  SpO2 98%  LMP 12/25/2011  Physical Exam  Physical Exam  Constitutional: She is oriented to person, place, and time and well-developed, well-nourished, and in no distress. No distress.  HENT:  [place]: Normocephalic and atraumatic.  Eyes: Conjunctivae are normal.  Neck: Neck supple. No thyromegaly present.  Cardiovascular: Normal rate and normal heart sounds.   No murmur heard. irregular  Pulmonary/Chest: Effort normal and breath sounds normal. She has no wheezes.  Abdominal: She exhibits no distension and no mass.  Musculoskeletal: She exhibits no edema.  Lymphadenopathy:    She has no cervical adenopathy.  Neurological: She is alert and oriented to person, place, and time.  Skin: Skin is warm and dry. No rash noted. She is not diaphoretic.  Psychiatric: Memory, affect and judgment normal.    Lab Results  Component Value Date   TSH 1.27 03/10/2013   Lab Results  Component Value Date   WBC 8.5 02/09/2014   HGB 15.1* 02/09/2014   HCT 44.9 02/09/2014   MCV 94.6 02/09/2014   PLT 234.0 02/09/2014   Lab Results  Component Value Date   CREATININE 0.9 02/09/2014   BUN 16 02/09/2014   NA 138 02/09/2014   K 4.4 02/09/2014    CL 104 02/09/2014   CO2 27 02/09/2014   Lab Results  Component Value Date   ALT 13 03/10/2013   AST 16 03/10/2013   ALKPHOS 65 03/10/2013   BILITOT 0.6 03/10/2013   Lab Results  Component Value Date   CHOL 161 01/08/2012   Lab Results  Component Value Date   HDL 59.70 01/08/2012  Lab Results  Component Value Date   LDLCALC 86 01/08/2012   Lab Results  Component Value Date   TRIG 77.0 01/08/2012   Lab Results  Component Value Date   CHOLHDL 3 01/08/2012     Assessment & Plan  ATRIAL FIBRILLATION Rate controlled, no changes  Apnea, sleep Not using CPAP for past month, only O2 and feels better. Agrees to reevaluation by pulmonology before deciding about long term use of CPAP  HYPERLIPIDEMIA Encouraged heart healthy diet, increase exercise, avoid trans fats, consider a krill oil cap daily  EXOGENOUS OBESITY Encouraged DASH diet, decrease po intake and increase exercise as tolerated. Needs 7-8 hours of sleep nightly. Avoid trans fats, eat small, frequent meals every 4-5 hours with lean proteins, complex carbs and healthy fats. Minimize simple carbs, GMO foods.  VITAMIN D DEFICIENCY Recheck level at next visit

## 2014-06-20 NOTE — Patient Instructions (Signed)

## 2014-06-20 NOTE — Telephone Encounter (Signed)
UA with c&s, hgba1c also for uti and hyperglycemia..  Labs ordered   Please inform patient that she will need to do labs prior to next appt.

## 2014-06-20 NOTE — Telephone Encounter (Signed)
Relevant patient education mailed to patient.  

## 2014-06-20 NOTE — Assessment & Plan Note (Signed)
Rate controlled, no changes 

## 2014-06-20 NOTE — Assessment & Plan Note (Signed)
Recheck level at next visit

## 2014-06-20 NOTE — Assessment & Plan Note (Signed)
Encouraged DASH diet, decrease po intake and increase exercise as tolerated. Needs 7-8 hours of sleep nightly. Avoid trans fats, eat small, frequent meals every 4-5 hours with lean proteins, complex carbs and healthy fats. Minimize simple carbs, GMO foods. 

## 2014-06-20 NOTE — Progress Notes (Signed)
Pre visit review using our clinic review tool, if applicable. No additional management support is needed unless otherwise documented below in the visit note. 

## 2014-06-20 NOTE — Telephone Encounter (Signed)
Informed patient of this.  °

## 2014-06-20 NOTE — Telephone Encounter (Signed)
Message copied by Varney Daily on Mon Jun 20, 2014  1:57 PM ------      Message from: Penni Homans A      Created: Mon Jun 20, 2014 12:52 PM       forgot to include labs prior to her next visit, lipid, renal, cbc, Vitamin D and hepatic for low vitamin d, htn and hyperlipid ------

## 2014-06-27 ENCOUNTER — Telehealth: Payer: Self-pay | Admitting: *Deleted

## 2014-06-27 NOTE — Telephone Encounter (Signed)
Pt given number to Pulmonary

## 2014-06-27 NOTE — Telephone Encounter (Signed)
Confidential Office Message [address] [place], [place] [address] p. [phone] f. [phone] To: Jarome Lamas (After Hours Triage) Fax: [phone] From: [facility] Date/ Time: 06/27/2014 8:19 AM Taken By: Theodoro Kos, CSR Caller: [place]: home Patient: Gina, Reilly DOB: 1924/01/03 Phone: [phone] Reason for Call: Alyse Low, patient is trying to get in touch with the lung dr that she was referred to, but can't find the number. Please let her know. Thanks Regarding Appointment: Appt Date: Appt Time: Unknown Provider: Reason: Details:

## 2014-06-27 NOTE — Telephone Encounter (Signed)
Please call patient with the number for the lung doctor

## 2014-07-21 DIAGNOSIS — Z961 Presence of intraocular lens: Secondary | ICD-10-CM | POA: Diagnosis not present

## 2014-08-02 ENCOUNTER — Encounter: Payer: Self-pay | Admitting: Pulmonary Disease

## 2014-08-02 ENCOUNTER — Ambulatory Visit (INDEPENDENT_AMBULATORY_CARE_PROVIDER_SITE_OTHER): Payer: Medicare Other | Admitting: Pulmonary Disease

## 2014-08-02 VITALS — BP 116/62 | HR 92 | Temp 98.0°F | Ht 68.0 in | Wt 233.8 lb

## 2014-08-02 DIAGNOSIS — G4733 Obstructive sleep apnea (adult) (pediatric): Secondary | ICD-10-CM

## 2014-08-02 NOTE — Patient Instructions (Signed)
Will check oxygen level overnight off cpap, and will call you with results.  Can arrange for oxygen at night if needed. Work on weight loss Let me know if you decide to go back on cpap, and we can work on mask fit and pressure adjustments.

## 2014-08-02 NOTE — Progress Notes (Signed)
Subjective:    Patient ID: Gina Reilly, female    DOB: 1924-05-26, 80 y.o.   MRN: [id number]  HPI The patient is an 80 year old female who I've been asked to see for management of obstructive sleep apnea. She was diagnosed with moderate OSA in 2013, with an AHI of 28 events per hour. She has been on CPAP for this, but is really unhappy with having to wear each night.  She has been off of CPAP most recently, and really has not seen a big change in her symptoms.  The patient currently sleeps alone, and is unsure if she snores. She does not think she has choking arousals. She will awaken 2-3 times a night, and blames this on having nocturia. She is only rested about 50% of the mornings, but does not feel poorly. She notes some sleep pressure during the day with inactivity such as reading or watching television, and can doze at night with inactivity as well. She denies sleepiness with driving.  The patient's weight is up 5 pounds over the last 2 years, and her Epworth score today is 4    Sleep Questionnaire What time do you typically go to bed?( Between what hours) 11:30p-12 AM 11:30p-12 AM at 1545 on 08/02/14 by Inge Rise, CMA How long does it take you to fall asleep? couple minutes couple minutes at 1545 on 08/02/14 by Inge Rise, CMA How many times during the night do you wake up? 2 2 at 1545 on 08/02/14 by Inge Rise, [place] What time do you get out of bed to start your day? 0800 0800 at 1545 on 08/02/14 by Inge Rise, CMA Do you drive or operate heavy machinery in your occupation? No No at 1545 on 08/02/14 by Inge Rise, CMA How much has your weight changed (up or down) over the past two years? (In pounds) 5 lb (2.268 kg) 5 lb (2.268 kg) at 1545 on 08/02/14 by Inge Rise, CMA Have you ever had a sleep study before? Yes Yes at 1545 on 08/02/14 by Inge Rise, CMA If yes, location of study? [place] and [facility] and [facility] at 7673 on 08/02/14 by Inge Rise, CMA If yes,  date of study? 2013 2013 at 1545 on 08/02/14 by Inge Rise, CMA Do you currently use CPAP? Yes Yes at 1545 on 08/02/14 by Inge Rise, CMA If so, what pressure? ?8 ?8 at 1545 on 08/02/14 by Inge Rise, CMA Do you wear oxygen at any time? Yes Yes at 1545 on 08/02/14 by Inge Rise, CMA O2 Flow Rate (L/min) 2 L/min 2 L/min at 1545 on 08/02/14 by Inge Rise, CMA   Review of Systems  Constitutional: Negative for fever and unexpected weight change.  HENT: Negative for congestion, dental problem, ear pain, nosebleeds, postnasal drip, rhinorrhea, sinus pressure, sneezing, sore throat and trouble swallowing.   Eyes: Negative for redness and itching.  Respiratory: Negative for cough, chest tightness, shortness of breath and wheezing.   Cardiovascular: Negative for palpitations and leg swelling.  Gastrointestinal: Negative for nausea and vomiting.  Genitourinary: Negative for dysuria.  Musculoskeletal: Negative for joint swelling.  Skin: Negative for rash.  Neurological: Negative for headaches.  Hematological: Does not bruise/bleed easily.  Psychiatric/Behavioral: Negative for dysphoric mood. The patient is not nervous/anxious.        Objective:   Physical Exam Constitutional:  Obese female, no acute distress  HENT:  Nares patent without discharge  Oropharynx  without exudate, palate and uvula are thickened and elongated.  Eyes:  Perrla, eomi, no scleral icterus  Neck:  No JVD, no TMG  Cardiovascular:  ?Normal rate, regular rhythm, no rubs or gallops.  No murmurs        Intact distal pulses  Pulmonary :  Normal breath sounds, no stridor or respiratory distress   No rales, rhonchi, or wheezing  Abdominal:  Soft, nondistended, bowel sounds present.  No tenderness noted.   Musculoskeletal:  1+ lower extremity edema noted, +varicosities.  Lymph Nodes:  No cervical lymphadenopathy noted  Skin:  No cyanosis noted  Neurologic:  Alert, appropriate, moves all 4  extremities without obvious deficit.         Assessment & Plan:

## 2014-08-02 NOTE — Assessment & Plan Note (Signed)
The patient has a history of moderate obstructive sleep apnea, and has been on CPAP for treatment. She is tired of wearing CPAP, and would prefer not going back on the device. I have had a long discussion with her about sleep apnea, including its impact to her quality of life and cardiovascular health. She really saw no difference in how she slept for felt while wearing the device, and actually became more frustrated with it over time. There is no question that she has abnormal sleepiness during the day, and some degree of nonrestorative sleep when not wearing CPAP. However, she is willing to live with this and exchanged for not wearing CPAP. I have also explained to her that untreated sleep apnea can aggravate atrial fibrillation and make it more difficult to control.  She is a little more concerned about this possibility, but tells me that she is willing to except the risk given her age.  If she is not going to wear CPAP, we need to check overnight oximetry and make sure that she is being adequately oxygenated on room air. I have also given her the option of calling me at any time if she wishes to restart CPAP, and then we can work on mask fit and perhaps more comfortable pressure settings. Finally, I have encouraged her to work aggressively on weight loss.

## 2014-08-16 NOTE — Progress Notes (Signed)
HPI: FU atrial fibrillation. Patient has a history of amiodarone lung toxicity as well; h/o bradycardia. Echocardiogram in June of 2013 showed normal LV function, mild left ventricular hypertrophy, grade 2 diastolic dysfunction, severe pulmonary hypertension and mild left atrial enlargement. Holter monitor in April of 2015 showed atrial fibrillation with mildly elevated rate. Cardizem was increased. Since she was last seen, She denies dyspnea, chest pain, palpitations, syncope or bleeding.   Current Outpatient Prescriptions  Medication Sig Dispense Refill  . acetaminophen (TYLENOL) 500 MG tablet Take 2 tablets (1,000 mg total) by mouth every 8 (eight) hours as needed for pain (max of 3000 mg in 24 hours).  30 tablet  0  . dabigatran (PRADAXA) 150 MG CAPS capsule TAKE 1 CAPSULE TWO TIMES DAILY  180 capsule  1  . diltiazem (CARDIZEM CD) 300 MG 24 hr capsule Take 1 capsule (300 mg total) by mouth daily.  90 capsule  3  . furosemide (LASIX) 20 MG tablet Take 20 mg by mouth as needed.      Marland Kitchen losartan (COZAAR) 100 MG tablet Take 50 mg by mouth daily. In pm      . Probiotic Product (PROBIOTIC DAILY PO) Take 1 tablet by mouth daily.      . ranitidine (ZANTAC) 150 MG tablet Take 1 tablet (150 mg total) by mouth daily as needed for heartburn.  30 tablet  1   No current facility-administered medications for this visit.     Past Medical History  Diagnosis Date  . Anemia   . GERD (gastroesophageal reflux disease)   . Thyroid disease     hypothroidism  . Arthritis     osteo  . Hyperlipidemia   . Vitamin D deficiency   . Chicken pox as a child  . Mumps as a child  . Measles as a child  . Hypertension   . Skin cancer     left temple  . Obese   . Ovarian cyst   . Pelvic mass in female   . Bronchitis 12/25/2011  . Contusion of leg, left 01/16/2012  . Peripheral neuropathy 02/26/2012  . Hyperglycemia 01/10/2010    Qualifier: Diagnosis of  By: Niel Hummer MD, [place] Atrial  fibrillation     Status post TEE guided DCCV 07/2010:  Transesophageal Echocardiogram 07/2010: EF 60-65% mild MR.;  Amiodarone;  Pradaxa  . Hypoxia 04/14/2012  . Apnea, sleep 04/24/2012  . Hyperthyroidism 02/09/2009    Qualifier: Diagnosis of  By: Niel Hummer MD, [place] Intermittent claudication 05/22/2012    Past Surgical History  Procedure Laterality Date  . Cataract extraction      Dr. Satira Sark  . Total hip arthroplasty  2003    left  . Abdominal hysterectomy    . Skin biopsy      left temple. cancerous    History   Social History  . Marital Status: Widowed    Spouse Name: N/A    Number of Children: 32  . Years of Education: N/A   Occupational History  . retired    Social History Main Topics  . Smoking status: Former Smoker -- 0.30 packs/day for 5 years    Types: Cigarettes    Quit date: 10/28/1973  . Smokeless tobacco: Never Used  . Alcohol Use: Yes     Comment: socially  . Drug Use: No  . Sexual Activity: Not on file   Other Topics Concern  . Not on file  Social History Narrative  . No narrative on file    ROS: no fevers or chills, productive cough, hemoptysis, dysphasia, odynophagia, melena, hematochezia, dysuria, hematuria, rash, seizure activity, orthopnea, PND, pedal edema, claudication. Remaining systems are negative.  Physical Exam: Well-developed well-nourished in no acute distress.  Skin is warm and dry.  HEENT is normal.  Neck is supple.  Chest is clear to auscultation with normal expansion.  Cardiovascular exam is irregular Abdominal exam nontender or distended. No masses palpated. Extremities show no edema. neuro grossly intact  ECG Atrial fibrillation at a rate of 81. Cannot rule out prior anterior infarct.

## 2014-08-18 ENCOUNTER — Ambulatory Visit (INDEPENDENT_AMBULATORY_CARE_PROVIDER_SITE_OTHER): Payer: Medicare Other | Admitting: Cardiology

## 2014-08-18 ENCOUNTER — Encounter: Payer: Self-pay | Admitting: Cardiology

## 2014-08-18 VITALS — BP 118/68 | HR 81 | Ht 68.0 in | Wt 227.1 lb

## 2014-08-18 DIAGNOSIS — I480 Paroxysmal atrial fibrillation: Secondary | ICD-10-CM

## 2014-08-18 DIAGNOSIS — I5032 Chronic diastolic (congestive) heart failure: Secondary | ICD-10-CM | POA: Diagnosis not present

## 2014-08-18 DIAGNOSIS — I10 Essential (primary) hypertension: Secondary | ICD-10-CM | POA: Diagnosis not present

## 2014-08-18 DIAGNOSIS — I4891 Unspecified atrial fibrillation: Secondary | ICD-10-CM | POA: Diagnosis not present

## 2014-08-18 NOTE — Assessment & Plan Note (Signed)
Blood pressure controlled. Continue present medications. 

## 2014-08-18 NOTE — Assessment & Plan Note (Signed)
Continue Lasix as needed. She is euvolemic on examination.

## 2014-08-18 NOTE — Assessment & Plan Note (Signed)
Patient remains in permanent atrial fibrillation. Continue Cardizem for control. Continue pradaxa. Check hemoglobin and renal function.

## 2014-08-18 NOTE — Patient Instructions (Signed)
Your physician wants you to follow-up in: [place] will receive a reminder letter in the mail two months in advance. If you don't receive a letter, please call our office to schedule the follow-up appointment.   Your physician recommends that YOU RETURN FOR BLOOD WORK WHEN ABLE

## 2014-08-19 LAB — BASIC METABOLIC PANEL WITH GFR
BUN: 21 mg/dL (ref 6–23)
CALCIUM: 9.3 mg/dL (ref 8.4–10.5)
CHLORIDE: 105 meq/L (ref 96–112)
CO2: 23 mEq/L (ref 19–32)
Creat: 1.03 mg/dL (ref 0.50–1.10)
GFR, EST NON AFRICAN AMERICAN: 49 mL/min — AB
GFR, Est African American: 56 mL/min — ABNORMAL LOW
Glucose, Bld: 121 mg/dL — ABNORMAL HIGH (ref 70–99)
Potassium: 5.3 mEq/L (ref 3.5–5.3)
Sodium: 137 mEq/L (ref 135–145)

## 2014-08-19 LAB — CBC
HCT: 46.1 % — ABNORMAL HIGH (ref 36.0–46.0)
Hemoglobin: 15.4 g/dL — ABNORMAL HIGH (ref 12.0–15.0)
MCH: 31.9 pg (ref 26.0–34.0)
MCHC: 33.4 g/dL (ref 30.0–36.0)
MCV: 95.4 fL (ref 78.0–100.0)
PLATELETS: 242 10*3/uL (ref 150–400)
RBC: 4.83 MIL/uL (ref 3.87–5.11)
RDW: 13.6 % (ref 11.5–15.5)
WBC: 7.5 10*3/uL (ref 4.0–10.5)

## 2014-08-30 ENCOUNTER — Telehealth: Payer: Self-pay | Admitting: Pulmonary Disease

## 2014-08-30 DIAGNOSIS — G4733 Obstructive sleep apnea (adult) (pediatric): Secondary | ICD-10-CM

## 2014-08-30 NOTE — Telephone Encounter (Signed)
Called spoke with pt. She had ONO done by [facility]. Asking for results. I did not see results and will call in AM. Pt also aware [place] not back until Friday AM.

## 2014-08-31 NOTE — Telephone Encounter (Signed)
Results received and placed in [place] green folder. Please advise thanks

## 2014-08-31 NOTE — Telephone Encounter (Signed)
Called [facility] and download will be faxed to triage

## 2014-09-05 ENCOUNTER — Telehealth: Payer: Self-pay | Admitting: Pulmonary Disease

## 2014-09-05 ENCOUNTER — Telehealth: Payer: Self-pay | Admitting: Family Medicine

## 2014-09-05 NOTE — Telephone Encounter (Signed)
Pt would like to speak with you regarding getting off of her c-pap machine because she is not using it.

## 2014-09-05 NOTE — Telephone Encounter (Signed)
I reviewed pulmonology notes and they have discussed with her that it is ultimately her choice whether to take the risk of not using the CPAP and she is aware of the dangers. What I need to know is have we rechecked her O2 overnight on room air to make sure she is not desating? If not I can arrange through [facility] I believe. Please see what she needs and I will proceed

## 2014-09-05 NOTE — Telephone Encounter (Signed)
Called Gina Reilly and she has now left. Was told to call after 8 tomorrow. WCB

## 2014-09-05 NOTE — Telephone Encounter (Signed)
Called spoke with pt. She reports she already has oxygen and already sleeps with this. She is not sure what this is set at.  I have sent order to [facility]. Nothing further needed

## 2014-09-05 NOTE — Telephone Encounter (Signed)
Please advise? I believe we have discussed this in the past?

## 2014-09-05 NOTE — Telephone Encounter (Signed)
Please let pt know that her sats dropped to 77%, and spent over 3 hrs lower than she should be Would therefore start on oxygen at 2 liters at HS only if she is not going to wear cpap.  Ok to send order.

## 2014-09-06 NOTE — Telephone Encounter (Signed)
Order received to start O2 at night. Pt is currently on O2 24/7 since 2013. O2 set at 2 liters and is bled into CPAP.  Per notes in chart, no changes need to be made, order was placed to ensure that the patient is using 2 Liters at night.  Nothing further needed. Inge Rise, CMA at 09/05/2014 4:10 PM     Status: Signed       Expand All Collapse All   Called spoke with pt. She reports she already has oxygen and already sleeps with this. She is not sure what this is set at. I have sent order to [facility]. Nothing further needed            Kathee Delton, MD at 09/05/2014 4:03 PM     Status: Signed       Expand All Collapse All   Please let pt know that her sats dropped to 77%, and spent over 3 hrs lower than she should be Would therefore start on oxygen at 2 liters at HS only if she is not going to wear cpap. Ok to send order.

## 2014-09-08 NOTE — Telephone Encounter (Signed)
Left a detailed message for patient and asked her to return our call

## 2014-09-13 NOTE — Telephone Encounter (Signed)
Can you please try to get ahold of this patient for [place] [facility]

## 2014-09-14 NOTE — Telephone Encounter (Signed)
Spoke with patient who states that [facility] checked her oxygen sats in October and they do drop at night. She spoke with [facility] yesterday and they stated that they have her on the list to come out and titrate her machine. She is using O2 at night but she could not tell me how many liters. She states that she is not getting up as many times at night and is feeling more rested. Advised to let us know if they do not get her machine titrated to let us know.

## 2014-09-14 NOTE — Telephone Encounter (Signed)
LM for patient to return the call.  

## 2014-09-15 NOTE — Telephone Encounter (Signed)
Discussed with PCP. [place] with notations that patient is on overnight O2.

## 2014-09-16 ENCOUNTER — Other Ambulatory Visit: Payer: Self-pay | Admitting: Cardiology

## 2014-09-25 ENCOUNTER — Emergency Department (HOSPITAL_COMMUNITY): Payer: Medicare Other

## 2014-09-25 ENCOUNTER — Emergency Department (HOSPITAL_COMMUNITY)
Admission: EM | Admit: 2014-09-25 | Discharge: 2014-09-25 | Disposition: A | Discharge status: Home / Self Care | Payer: Medicare Other | Attending: Emergency Medicine | Admitting: Emergency Medicine

## 2014-09-25 ENCOUNTER — Encounter (HOSPITAL_COMMUNITY): Payer: Self-pay | Admitting: *Deleted

## 2014-09-25 DIAGNOSIS — M545 Low back pain, unspecified: Secondary | ICD-10-CM

## 2014-09-25 DIAGNOSIS — M549 Dorsalgia, unspecified: Secondary | ICD-10-CM

## 2014-09-25 DIAGNOSIS — I10 Essential (primary) hypertension: Secondary | ICD-10-CM | POA: Diagnosis not present

## 2014-09-25 DIAGNOSIS — Y929 Unspecified place or not applicable: Secondary | ICD-10-CM | POA: Insufficient documentation

## 2014-09-25 DIAGNOSIS — Y9389 Activity, other specified: Secondary | ICD-10-CM | POA: Insufficient documentation

## 2014-09-25 DIAGNOSIS — Z85828 Personal history of other malignant neoplasm of skin: Secondary | ICD-10-CM | POA: Insufficient documentation

## 2014-09-25 DIAGNOSIS — Z9849 Cataract extraction status, unspecified eye: Secondary | ICD-10-CM | POA: Insufficient documentation

## 2014-09-25 DIAGNOSIS — Z8619 Personal history of other infectious and parasitic diseases: Secondary | ICD-10-CM | POA: Insufficient documentation

## 2014-09-25 DIAGNOSIS — S86912A Strain of unspecified muscle(s) and tendon(s) at lower leg level, left leg, initial encounter: Secondary | ICD-10-CM | POA: Diagnosis not present

## 2014-09-25 DIAGNOSIS — M1990 Unspecified osteoarthritis, unspecified site: Secondary | ICD-10-CM | POA: Diagnosis not present

## 2014-09-25 DIAGNOSIS — I4891 Unspecified atrial fibrillation: Secondary | ICD-10-CM | POA: Diagnosis not present

## 2014-09-25 DIAGNOSIS — Z8669 Personal history of other diseases of the nervous system and sense organs: Secondary | ICD-10-CM | POA: Insufficient documentation

## 2014-09-25 DIAGNOSIS — Z8709 Personal history of other diseases of the respiratory system: Secondary | ICD-10-CM | POA: Insufficient documentation

## 2014-09-25 DIAGNOSIS — K219 Gastro-esophageal reflux disease without esophagitis: Secondary | ICD-10-CM | POA: Diagnosis not present

## 2014-09-25 DIAGNOSIS — Z87828 Personal history of other (healed) physical injury and trauma: Secondary | ICD-10-CM | POA: Diagnosis not present

## 2014-09-25 DIAGNOSIS — Z7902 Long term (current) use of antithrombotics/antiplatelets: Secondary | ICD-10-CM | POA: Diagnosis not present

## 2014-09-25 DIAGNOSIS — Y998 Other external cause status: Secondary | ICD-10-CM | POA: Insufficient documentation

## 2014-09-25 DIAGNOSIS — Z87891 Personal history of nicotine dependence: Secondary | ICD-10-CM | POA: Insufficient documentation

## 2014-09-25 DIAGNOSIS — Z862 Personal history of diseases of the blood and blood-forming organs and certain disorders involving the immune mechanism: Secondary | ICD-10-CM | POA: Diagnosis not present

## 2014-09-25 DIAGNOSIS — E669 Obesity, unspecified: Secondary | ICD-10-CM | POA: Diagnosis not present

## 2014-09-25 DIAGNOSIS — Z79899 Other long term (current) drug therapy: Secondary | ICD-10-CM | POA: Diagnosis not present

## 2014-09-25 DIAGNOSIS — X58XXXA Exposure to other specified factors, initial encounter: Secondary | ICD-10-CM | POA: Diagnosis not present

## 2014-09-25 DIAGNOSIS — S86911A Strain of unspecified muscle(s) and tendon(s) at lower leg level, right leg, initial encounter: Secondary | ICD-10-CM | POA: Diagnosis not present

## 2014-09-25 DIAGNOSIS — Z8742 Personal history of other diseases of the female genital tract: Secondary | ICD-10-CM | POA: Diagnosis not present

## 2014-09-25 MED ORDER — KETOROLAC TROMETHAMINE 60 MG/2ML IM SOLN
60.0000 mg | Freq: Once | INTRAMUSCULAR | Status: AC
Start: 2014-09-25 — End: 2014-09-25
  Administered 2014-09-25: 60 mg via INTRAMUSCULAR
  Filled 2014-09-25: qty 2

## 2014-09-25 MED ORDER — OXYCODONE-ACETAMINOPHEN 5-325 MG PO TABS: 1.0000 | ORAL_TABLET | ORAL | Status: DC | PRN

## 2014-09-25 MED ORDER — OXYCODONE-ACETAMINOPHEN 5-325 MG PO TABS
1.0000 | ORAL_TABLET | Freq: Once | ORAL | Status: AC
  Administered 2014-09-25: 1 via ORAL
  Filled 2014-09-25: qty 1

## 2014-09-25 MED ORDER — DIAZEPAM 2 MG PO TABS
2.0000 mg | ORAL_TABLET | Freq: Once | ORAL | Status: AC
  Administered 2014-09-25: 2 mg via ORAL
  Filled 2014-09-25: qty 1

## 2014-09-25 MED ORDER — IBUPROFEN 400 MG PO TABS: 400.0000 mg | ORAL_TABLET | Freq: Three times a day (TID) | ORAL | Status: AC

## 2014-09-25 MED ORDER — DIAZEPAM 2 MG PO TABS: 2.0000 mg | ORAL_TABLET | Freq: Three times a day (TID) | ORAL | Status: DC | PRN

## 2014-09-25 NOTE — ED Provider Notes (Signed)
CSN: [id number]     Arrival date & time 09/25/14  0941 History   First MD Initiated Contact with Patient 09/25/14 [phone]     Chief Complaint  Patient presents with  . Back Pain     The history is provided by the patient.   patient reports developing low left back pain 1 week ago.  Her pain seems to get worse at times.  Her pain is improved by not moving.  Her pain is worsened by movement and certain positions.  No rash.  No fevers or chills.  Denies abdominal pain.  Denies nausea vomiting and diarrhea.  No new weakness of her lower extremities.  No trauma.  She does report bending one week ago to get something between her toes which caused pain.  She's tried Tylenol at night without improvement in her symptoms.  She has had some improvement with heat and cold applications.  Her pain is mild to moderate in severity.  Past Medical History  Diagnosis Date  . Anemia   . GERD (gastroesophageal reflux disease)   . Thyroid disease     hypothroidism  . Arthritis     osteo  . Hyperlipidemia   . Vitamin D deficiency   . Chicken pox as a child  . Mumps as a child  . Measles as a child  . Hypertension   . Skin cancer     left temple  . Obese   . Ovarian cyst   . Pelvic mass in female   . Bronchitis 12/25/2011  . Contusion of leg, left 01/16/2012  . Peripheral neuropathy 02/26/2012  . Hyperglycemia 01/10/2010    Qualifier: Diagnosis of  By: Niel Hummer MD, [place] Atrial fibrillation     Status post TEE guided DCCV 07/2010:  Transesophageal Echocardiogram 07/2010: EF 60-65% mild MR.;  Amiodarone;  Pradaxa  . Hypoxia 04/14/2012  . Apnea, sleep 04/24/2012  . Hyperthyroidism 02/09/2009    Qualifier: Diagnosis of  By: Niel Hummer MD, [place] Intermittent claudication 05/22/2012   Past Surgical History  Procedure Laterality Date  . Cataract extraction      Dr. Satira Sark  . Total hip arthroplasty  2003    left  . Abdominal hysterectomy    . Skin biopsy      left temple. cancerous    Family History  Problem Relation Age of Onset  . Stroke Mother   . Cancer Father     prostate  . Hypertension Brother   . Cancer Maternal Grandmother   . Hypertension Brother   . Heart disease Brother   . Obesity Brother   . Hypertension Brother   . Hyperlipidemia Brother   . Heart disease Brother   . Kidney disease Brother   . Cancer Daughter     kidney- spine and blood stream  . Heart disease Son     valvular heart disease  . Prostate cancer Brother    History  Substance Use Topics  . Smoking status: Former Smoker -- 0.30 packs/day for 5 years    Types: Cigarettes    Quit date: 10/28/1973  . Smokeless tobacco: Never Used  . Alcohol Use: Yes     Comment: socially   OB History    No data available     Review of Systems  All other systems reviewed and are negative.     Allergies  Amiodarone and Aspercreme  Home Medications   Prior to Admission medications   Medication Sig Start  Date End Date Taking? Authorizing Provider  acetaminophen (TYLENOL) 500 MG tablet Take 2 tablets (1,000 mg total) by mouth every 8 (eight) hours as needed for pain (max of 3000 mg in 24 hours). 06/25/13   Mosie Lukes, MD  diltiazem (CARDIZEM CD) 300 MG 24 hr capsule Take 1 capsule (300 mg total) by mouth daily. 02/21/14   Lelon Perla, MD  furosemide (LASIX) 20 MG tablet Take 20 mg by mouth as needed. 03/10/14   Mosie Lukes, MD  losartan (COZAAR) 100 MG tablet Take 50 mg by mouth daily. In pm 06/25/13   Mosie Lukes, MD  PRADAXA 150 MG CAPS capsule TAKE 1 CAPSULE TWICE DAILY 09/19/14   Lelon Perla, MD  Probiotic Product (PROBIOTIC DAILY PO) Take 1 tablet by mouth daily.    Historical Provider, MD  ranitidine (ZANTAC) 150 MG tablet Take 1 tablet (150 mg total) by mouth daily as needed for heartburn. 06/25/13   Mosie Lukes, MD   BP 150/90 mmHg  Pulse 81  Temp(Src) 97.9 F (36.6 C) (Oral)  Resp 18  SpO2 100%  LMP 12/25/2011 Physical Exam  Constitutional: She is  oriented to person, place, and time. She appears well-developed and well-nourished. No distress.  HENT:  [place]: Normocephalic and atraumatic.  Eyes: EOM are normal.  Neck: Normal range of motion.  Cardiovascular: Normal rate, regular rhythm and normal heart sounds.   Pulmonary/Chest: Effort normal and breath sounds normal.  Abdominal: Soft. She exhibits no distension. There is no tenderness.  Musculoskeletal: Normal range of motion.  Mild lumbar and paralumbar tenderness and mild spasm on the left side.  Normal strain in her bilateral lower extremity major muscle groups.  Normal PT and DP pulses bilaterally.  No edema of her legs.  Full range of motion bilateral hips knees and ankles.  Neurological: She is alert and oriented to person, place, and time.  Skin: Skin is warm and dry.  Psychiatric: She has a normal mood and affect. Judgment normal.  Nursing note and vitals reviewed.   ED Course  Procedures (including critical care time) Labs Review Labs Reviewed - No data to display  Imaging Review Dg Lumbar Spine Complete  09/25/2014   CLINICAL DATA:  Left lower back pain after pulling a muscle.  EXAM: LUMBAR SPINE - COMPLETE 4+ VIEW  COMPARISON:  CT 04/09/2010  FINDINGS: Left hip total arthroplasty. Degenerative endplate changes in the lower lumbar spine. Disc space narrowing at L5-S1 and L4-L5. Vertebral body heights are maintained. Normal alignment.  IMPRESSION: Chronic disc space disease in the lower lumbar spine. No acute abnormality.   Electronically Signed   By: Markus Daft M.D.   On: 09/25/2014 12:49     EKG Interpretation None      MDM   Final diagnoses:  Back pain  Left-sided low back pain without sciatica   12:57 PM Patient feels much better at this time.  Suspect lumbar strain with spasm.  Ambulatory in the emergency department.  Feeling better with ambulation.  Discharge home with PCP follow-up.    Hoy Morn, MD 09/25/14 1257

## 2014-09-25 NOTE — Discharge Instructions (Signed)

## 2014-09-25 NOTE — ED Notes (Signed)
Pt reports hx of same. Bent down to get something and pulled a muscle. Has severe left side back pain and difficulty ambulating.

## 2014-10-05 ENCOUNTER — Other Ambulatory Visit: Payer: Self-pay | Admitting: *Deleted

## 2014-10-05 MED ORDER — DILTIAZEM HCL ER COATED BEADS 300 MG PO CP24: 300.0000 mg | ORAL_CAPSULE | Freq: Every day | ORAL | Status: DC

## 2014-10-12 ENCOUNTER — Other Ambulatory Visit: Payer: Self-pay

## 2014-10-12 MED ORDER — LOSARTAN POTASSIUM 100 MG PO TABS: 50.0000 mg | ORAL_TABLET | Freq: Every day | ORAL | Status: DC

## 2014-10-25 ENCOUNTER — Telehealth: Payer: Self-pay | Admitting: Family Medicine

## 2014-10-25 NOTE — Telephone Encounter (Signed)
Caller name:Nevils Amra Relation to QA:[facility] Call back [place]:  Reason for call: pt states that her daughter advised her to contact dr. Charlett Blake and make her aware that she had a dizzy spell this morning, felt unsteady most of the day, however now she feels just fine.

## 2014-10-26 NOTE — Telephone Encounter (Signed)
If she is still feeling well then make sure she is hydrating well and eating regularly every 4-5 hours if not she should be seen. Make her an appt for next couple of weeks

## 2014-10-26 NOTE — Telephone Encounter (Signed)
Pt notified and made aware.  Stated she still feels fine.  No more episodes of dizziness.  Pt was encouraged to follow Dr. Frederik Pear recommendations and to call us if symptoms return or new symptoms appear.  She stated understanding and agreed.

## 2014-11-22 ENCOUNTER — Ambulatory Visit (INDEPENDENT_AMBULATORY_CARE_PROVIDER_SITE_OTHER): Payer: Medicare Other | Admitting: Family Medicine

## 2014-11-22 ENCOUNTER — Encounter: Payer: Self-pay | Admitting: Family Medicine

## 2014-11-22 VITALS — BP 144/88 | HR 94 | Temp 97.7°F | Ht 67.0 in | Wt 233.4 lb

## 2014-11-22 DIAGNOSIS — I4891 Unspecified atrial fibrillation: Secondary | ICD-10-CM | POA: Diagnosis not present

## 2014-11-22 DIAGNOSIS — I10 Essential (primary) hypertension: Secondary | ICD-10-CM | POA: Diagnosis not present

## 2014-11-22 DIAGNOSIS — K219 Gastro-esophageal reflux disease without esophagitis: Secondary | ICD-10-CM

## 2014-11-22 DIAGNOSIS — L989 Disorder of the skin and subcutaneous tissue, unspecified: Secondary | ICD-10-CM | POA: Diagnosis not present

## 2014-11-22 DIAGNOSIS — K5900 Constipation, unspecified: Secondary | ICD-10-CM

## 2014-11-22 DIAGNOSIS — Z0000 Encounter for general adult medical examination without abnormal findings: Secondary | ICD-10-CM | POA: Diagnosis not present

## 2014-11-22 DIAGNOSIS — E559 Vitamin D deficiency, unspecified: Secondary | ICD-10-CM | POA: Diagnosis not present

## 2014-11-22 DIAGNOSIS — E669 Obesity, unspecified: Secondary | ICD-10-CM

## 2014-11-22 DIAGNOSIS — Z79899 Other long term (current) drug therapy: Secondary | ICD-10-CM | POA: Diagnosis not present

## 2014-11-22 HISTORY — DX: Constipation, unspecified: K59.00

## 2014-11-22 MED ORDER — RANITIDINE HCL 150 MG PO TABS: 150.0000 mg | ORAL_TABLET | Freq: Every day | ORAL | Status: DC | PRN

## 2014-11-22 MED ORDER — FUROSEMIDE 20 MG PO TABS: 20.0000 mg | ORAL_TABLET | ORAL | Status: DC | PRN

## 2014-11-22 NOTE — Assessment & Plan Note (Signed)
Has not been seen by dermatology recently

## 2014-11-22 NOTE — Progress Notes (Signed)
Pre visit review using our clinic review tool, if applicable. No additional management support is needed unless otherwise documented below in the visit note. 

## 2014-11-22 NOTE — Progress Notes (Signed)
Gina Reilly  [id number] 1924-01-24 11/22/2014      Progress Note-Follow Up  Subjective  Chief Complaint  Chief Complaint  Patient presents with  . Medicare Wellness    HPI  Patient is a 81 y.o. female in today for routine medical care. She is in for annual exam. Using Pericolace daily with good results. No bloody or tarry stool. No recent illness. Feeling well. Sleeping well. No trouble with ADLs. No acute concerns. Denies CP/palp/SOB/HA/congestion/fevers/GI or GU c/o. Taking meds as prescribed. Patient denies any difficulties at home. No trouble with ADLs, depression or falls. No recent changes to vision or hearing.  Past Medical History  Diagnosis Date  . Anemia   . GERD (gastroesophageal reflux disease)   . Thyroid disease     hypothroidism  . Arthritis     osteo  . Hyperlipidemia   . Vitamin D deficiency   . Chicken pox as a child  . Mumps as a child  . Measles as a child  . Hypertension   . Skin cancer     left temple  . Obese   . Ovarian cyst   . Pelvic mass in female   . Bronchitis 12/25/2011  . Contusion of leg, left 01/16/2012  . Peripheral neuropathy 02/26/2012  . Hyperglycemia 01/10/2010    Qualifier: Diagnosis of  By: Niel Hummer MD, [place] Atrial fibrillation     Status post TEE guided DCCV 07/2010:  Transesophageal Echocardiogram 07/2010: EF 60-65% mild MR.;  Amiodarone;  Pradaxa  . Hypoxia 04/14/2012  . Apnea, sleep 04/24/2012  . Hyperthyroidism 02/09/2009    Qualifier: Diagnosis of  By: Niel Hummer MD, [place] Intermittent claudication 05/22/2012    Past Surgical History  Procedure Laterality Date  . Cataract extraction      Dr. Satira Sark  . Total hip arthroplasty  2003    left  . Abdominal hysterectomy    . Skin biopsy      left temple. cancerous    Family History  Problem Relation Age of Onset  . Stroke Mother   . Cancer Father     prostate  . Hypertension Brother   . Cancer Maternal Grandmother   . Hypertension Brother   .  Heart disease Brother   . Obesity Brother   . Hypertension Brother   . Hyperlipidemia Brother   . Heart disease Brother   . Kidney disease Brother   . Cancer Daughter     kidney- spine and blood stream  . Heart disease Son     valvular heart disease  . Prostate cancer Brother     History   Social History  . Marital Status: Widowed    Spouse Name: N/A    Number of Children: 38  . Years of Education: N/A   Occupational History  . retired    Social History Main Topics  . Smoking status: Former Smoker -- 0.30 packs/day for 5 years    Types: Cigarettes    Quit date: 10/28/1973  . Smokeless tobacco: Never Used  . Alcohol Use: Yes     Comment: socially  . Drug Use: No  . Sexual Activity: Not on file   Other Topics Concern  . Not on file   Social History Narrative    Current Outpatient Prescriptions on File Prior to Visit  Medication Sig Dispense Refill  . acetaminophen (TYLENOL) 500 MG tablet Take 2 tablets (1,000 mg total) by mouth every 8 (eight) hours as  needed for pain (max of 3000 mg in 24 hours). 30 tablet 0  . diltiazem (CARDIZEM CD) 300 MG 24 hr capsule Take 1 capsule (300 mg total) by mouth daily. 90 capsule 1  . losartan (COZAAR) 100 MG tablet Take 0.5 tablets (50 mg total) by mouth daily. In pm 90 tablet 0  . PRADAXA 150 MG CAPS capsule TAKE 1 CAPSULE TWICE DAILY 180 capsule 1  . Probiotic Product (PROBIOTIC DAILY PO) Take 1 tablet by mouth daily.     No current facility-administered medications on file prior to visit.    Allergies  Allergen Reactions  . Amiodarone Other (See Comments)    Affecting the Lungs  . Aspercreme [Trolamine Salicylate]     Review of Systems  Review of Systems  Constitutional: Negative for fever, chills and malaise/fatigue.  HENT: Negative for congestion, hearing loss and nosebleeds.   Eyes: Negative for discharge.  Respiratory: Negative for cough, sputum production, shortness of breath and wheezing.   Cardiovascular:  Negative for chest pain, palpitations and leg swelling.  Gastrointestinal: Negative for heartburn, nausea, vomiting, abdominal pain, diarrhea, constipation and blood in stool.  Genitourinary: Negative for dysuria, urgency, frequency and hematuria.  Musculoskeletal: Negative for myalgias, back pain and falls.  Skin: Negative for rash.  Neurological: Negative for dizziness, tremors, sensory change, focal weakness, loss of consciousness, weakness and headaches.  Endo/Heme/Allergies: Negative for polydipsia. Does not bruise/bleed easily.  Psychiatric/Behavioral: Negative for depression and suicidal ideas. The patient is not nervous/anxious and does not have insomnia.     Objective  BP 163/102 mmHg  Pulse 115  Temp(Src) 97.7 F (36.5 C) (Oral)  Ht 5\' 7"  (1.702 m)  Wt 233 lb 6.4 oz (105.87 kg)  BMI 36.55 kg/m2  SpO2 94%  LMP 12/25/2011  Physical Exam  Physical Exam  Constitutional: She is oriented to person, place, and time and well-developed, well-nourished, and in no distress. No distress.  HENT:  [place]: Normocephalic and atraumatic.  Right Ear: External ear normal.  Left Ear: External ear normal.  Nose: Nose normal.  Mouth/Throat: Oropharynx is clear and moist. No oropharyngeal exudate.  Eyes: Conjunctivae are normal. Pupils are equal, round, and reactive to light. Right eye exhibits no discharge. Left eye exhibits no discharge. No scleral icterus.  Neck: Normal range of motion. Neck supple. No thyromegaly present.  Cardiovascular: Normal rate, normal heart sounds and intact distal pulses.   Pulmonary/Chest: Effort normal and breath sounds normal. No respiratory distress. She has no wheezes. She has no rales.  Abdominal: Soft. Bowel sounds are normal. She exhibits no distension and no mass. There is no tenderness.  Musculoskeletal: Normal range of motion. She exhibits no edema or tenderness.  Lymphadenopathy:    She has no cervical adenopathy.  Neurological: She is alert and  oriented to person, place, and time. She has normal reflexes. No cranial nerve deficit. Coordination normal.  Skin: Skin is warm and dry. No rash noted. She is not diaphoretic.  Psychiatric: Mood, memory and affect normal.    Lab Results  Component Value Date   TSH 1.27 03/10/2013   Lab Results  Component Value Date   WBC 7.5 08/18/2014   HGB 15.4* 08/18/2014   HCT 46.1* 08/18/2014   MCV 95.4 08/18/2014   PLT 242 08/18/2014   Lab Results  Component Value Date   CREATININE 1.03 08/18/2014   BUN 21 08/18/2014   NA 137 08/18/2014   K 5.3 08/18/2014   CL 105 08/18/2014   CO2 23 08/18/2014  Lab Results  Component Value Date   ALT 13 03/10/2013   AST 16 03/10/2013   ALKPHOS 65 03/10/2013   BILITOT 0.6 03/10/2013   Lab Results  Component Value Date   CHOL 161 01/08/2012   Lab Results  Component Value Date   HDL 59.70 01/08/2012   Lab Results  Component Value Date   LDLCALC 86 01/08/2012   Lab Results  Component Value Date   TRIG 77.0 01/08/2012   Lab Results  Component Value Date   CHOLHDL 3 01/08/2012     Assessment & Plan  Constipation Encouraged increased hydration and fiber in diet. Daily probiotics. If bowels not moving can use MOM 2 tbls po in 4 oz of warm prune juice by mouth every 2-3 days. If no results then repeat in 4 hours with  Dulcolax suppository pr, may repeat again in 4 more hours as needed. Seek care if symptoms worsen. Consider daily Miralax and/or Dulcolax if symptoms persist.  Pericolace daily increase clear fluids to close to 64 oz    Precancerous skin lesion Has not been seen by dermatology recently   ATRIAL FIBRILLATION Rate controlled, tolerating Pradaxa. asymptomatic   Hypertension Improved on recheck, asymptomatic and patient reports controlled number elsewhere.   GERD Avoid offending foods, start probiotics. Do not eat large meals in late evening and consider raising [place] of bed.    Vitamin D deficiency Low level with  labs today, start on 50000IU weekly. Recheck in 3 months   Obesity Encouraged DASH diet, decrease po intake and increase exercise as tolerated. Needs 7-8 hours of sleep nightly. Avoid trans fats, eat small, frequent meals every 4-5 hours with lean proteins, complex carbs and healthy fats. Minimize simple carbs   Medicare annual wellness visit, subsequent Patient denies any difficulties at home. No trouble with ADLs, depression or falls. No recent changes to vision or hearing. Is UTD with immunizations. Is UTD with screening. Discussed Advanced Directives, patient agrees to bring [place] copies of documents if can. Encouraged heart healthy diet, exercise as tolerated and adequate sleep. Follows with pulmonology, Dr Gwenette Greet Follow with cardiology, Dr Rance Muir with Dr Matthew Saras Does not participate in [facility], pap or colonoscopy screening at this time.

## 2014-11-22 NOTE — Assessment & Plan Note (Signed)
Encouraged increased hydration and fiber in diet. Daily probiotics. If bowels not moving can use MOM 2 tbls po in 4 oz of warm prune juice by mouth every 2-3 days. If no results then repeat in 4 hours with  Dulcolax suppository pr, may repeat again in 4 more hours as needed. Seek care if symptoms worsen. Consider daily Miralax and/or Dulcolax if symptoms persist.  Pericolace daily increase clear fluids to close to 64 oz

## 2014-11-22 NOTE — Patient Instructions (Signed)
UPDATE Advanced Directives: Living Will and Hodges and mail Dr B a copy complete   Preventive Care for Adults A healthy lifestyle and preventive care can promote health and wellness. Preventive health guidelines for women include the following key practices.  A routine yearly physical is a good way to check with your health care provider about your health and preventive screening. It is a chance to share any concerns and updates on your health and to receive a thorough exam.  Visit your dentist for a routine exam and preventive care every 6 months. Brush your teeth twice a day and floss once a day. Good oral hygiene prevents tooth decay and gum disease.  The frequency of eye exams is based on your age, health, family medical history, use of contact lenses, and other factors. Follow your health care provider's recommendations for frequency of eye exams.  Eat a healthy diet. Foods like vegetables, fruits, whole grains, low-fat dairy products, and lean protein foods contain the nutrients you need without too many calories. Decrease your intake of foods high in solid fats, added sugars, and salt. Eat the right amount of calories for you.Get information about a proper diet from your health care provider, if necessary.  Regular physical exercise is one of the most important things you can do for your health. Most adults should get at least 150 minutes of moderate-intensity exercise (any activity that increases your heart rate and causes you to sweat) each week. In addition, most adults need muscle-strengthening exercises on 2 or more days a week.  Maintain a healthy weight. The body mass index (BMI) is a screening tool to identify possible weight problems. It provides an estimate of body fat based on height and weight. Your health care provider can find your BMI and can help you achieve or maintain a healthy weight.For adults 20 years and older:  A BMI below 18.5 is considered  underweight.  A BMI of 18.5 to 24.9 is normal.  A BMI of 25 to 29.9 is considered overweight.  A BMI of 30 and above is considered obese.  Maintain normal blood lipids and cholesterol levels by exercising and minimizing your intake of saturated fat. Eat a balanced diet with plenty of fruit and vegetables. Blood tests for lipids and cholesterol should begin at age 48 and be repeated every 5 years. If your lipid or cholesterol levels are high, you are over 50, or you are at high risk for heart disease, you may need your cholesterol levels checked more frequently.Ongoing high lipid and cholesterol levels should be treated with medicines if diet and exercise are not working.  If you smoke, find out from your health care provider how to quit. If you do not use tobacco, do not start.  Lung cancer screening is recommended for adults aged 72-80 years who are at high risk for developing lung cancer because of a history of smoking. A yearly low-dose CT scan of the lungs is recommended for people who have at least a 30-pack-year history of smoking and are a current smoker or have quit within the past 15 years. A pack year of smoking is smoking an average of 1 pack of cigarettes a day for 1 year (for example: 1 pack a day for 30 years or 2 packs a day for 15 years). Yearly screening should continue until the smoker has stopped smoking for at least 15 years. Yearly screening should be stopped for people who develop a health problem that would prevent  them from having lung cancer treatment.  If you are pregnant, do not drink alcohol. If you are breastfeeding, be very cautious about drinking alcohol. If you are not pregnant and choose to drink alcohol, do not have more than 1 drink per day. One drink is considered to be 12 ounces (355 mL) of beer, 5 ounces (148 mL) of wine, or 1.5 ounces (44 mL) of liquor.  Avoid use of street drugs. Do not share needles with anyone. Ask for help if you need support or  instructions about stopping the use of drugs.  High blood pressure causes heart disease and increases the risk of stroke. Your blood pressure should be checked at least every 1 to 2 years. Ongoing high blood pressure should be treated with medicines if weight loss and exercise do not work.  If you are 54-34 years old, ask your health care provider if you should take aspirin to prevent strokes.  Diabetes screening involves taking a blood sample to check your fasting blood sugar level. This should be done once every 3 years, after age 14, if you are within normal weight and without risk factors for diabetes. Testing should be considered at a younger age or be carried out more frequently if you are overweight and have at least 1 risk factor for diabetes.  Breast cancer screening is essential preventive care for women. You should practice "breast self-awareness." This means understanding the normal appearance and feel of your breasts and may include breast self-examination. Any changes detected, no matter how small, should be reported to a health care provider. Women in their 27s and 30s should have a clinical breast exam (CBE) by a health care provider as part of a regular health exam every 1 to 3 years. After age 76, women should have a CBE every year. Starting at age 23, women should consider having a mammogram (breast X-ray test) every year. Women who have a family history of breast cancer should talk to their health care provider about genetic screening. Women at a high risk of breast cancer should talk to their health care providers about having an MRI and a mammogram every year.  Breast cancer gene (BRCA)-related cancer risk assessment is recommended for women who have family members with BRCA-related cancers. BRCA-related cancers include breast, ovarian, tubal, and peritoneal cancers. Having family members with these cancers may be associated with an increased risk for harmful changes (mutations) in  the breast cancer genes BRCA1 and BRCA2. Results of the assessment will determine the need for genetic counseling and BRCA1 and BRCA2 testing.  Routine pelvic exams to screen for cancer are no longer recommended for nonpregnant women who are considered low risk for cancer of the pelvic organs (ovaries, uterus, and vagina) and who do not have symptoms. Ask your health care provider if a screening pelvic exam is right for you.  If you have had past treatment for cervical cancer or a condition that could lead to cancer, you need Pap tests and screening for cancer for at least 20 years after your treatment. If Pap tests have been discontinued, your risk factors (such as having a new sexual partner) need to be reassessed to determine if screening should be resumed. Some women have medical problems that increase the chance of getting cervical cancer. In these cases, your health care provider may recommend more frequent screening and Pap tests.  The HPV test is an additional test that may be used for cervical cancer screening. The HPV test looks for the  virus that can cause the cell changes on the cervix. The cells collected during the Pap test can be tested for HPV. The HPV test could be used to screen women aged 76 years and older, and should be used in women of any age who have unclear Pap test results. After the age of 36, women should have HPV testing at the same frequency as a Pap test.  Colorectal cancer can be detected and often prevented. Most routine colorectal cancer screening begins at the age of 56 years and continues through age 15 years. However, your health care provider may recommend screening at an earlier age if you have risk factors for colon cancer. On a yearly basis, your health care provider may provide home test kits to check for hidden blood in the stool. Use of a small camera at the end of a tube, to directly examine the colon (sigmoidoscopy or colonoscopy), can detect the earliest forms  of colorectal cancer. Talk to your health care provider about this at age 76, when routine screening begins. Direct exam of the colon should be repeated every 5-10 years through age 50 years, unless early forms of pre-cancerous polyps or small growths are found.  People who are at an increased risk for hepatitis B should be screened for this virus. You are considered at high risk for hepatitis B if:  You were born in a country where hepatitis B occurs often. Talk with your health care provider about which countries are considered high risk.  Your parents were born in a high-risk country and you have not received a shot to protect against hepatitis B (hepatitis B vaccine).  You have HIV or AIDS.  You use needles to inject street drugs.  You live with, or have sex with, someone who has hepatitis B.  You get hemodialysis treatment.  You take certain medicines for conditions like cancer, organ transplantation, and autoimmune conditions.  Hepatitis C blood testing is recommended for all people born from [birth year] through 1965 and any individual with known risks for hepatitis C.  Practice safe sex. Use condoms and avoid high-risk sexual practices to reduce the spread of sexually transmitted infections (STIs). STIs include gonorrhea, chlamydia, syphilis, trichomonas, herpes, HPV, and human immunodeficiency virus (HIV). Herpes, HIV, and HPV are viral illnesses that have no cure. They can result in disability, cancer, and death.  You should be screened for sexually transmitted illnesses (STIs) including gonorrhea and chlamydia if:  You are sexually active and are younger than 24 years.  You are older than 24 years and your health care provider tells you that you are at risk for this type of infection.  Your sexual activity has changed since you were last screened and you are at an increased risk for chlamydia or gonorrhea. Ask your health care provider if you are at risk.  If you are at risk of  being infected with HIV, it is recommended that you take a prescription medicine daily to prevent HIV infection. This is called preexposure prophylaxis (PrEP). You are considered at risk if:  You are a heterosexual woman, are sexually active, and are at increased risk for HIV infection.  You take drugs by injection.  You are sexually active with a partner who has HIV.  Talk with your health care provider about whether you are at high risk of being infected with HIV. If you choose to begin PrEP, you should first be tested for HIV. You should then be tested every 3 months for as  long as you are taking PrEP.  Osteoporosis is a disease in which the bones lose minerals and strength with aging. This can result in serious bone fractures or breaks. The risk of osteoporosis can be identified using a bone density scan. Women ages 63 years and over and women at risk for fractures or osteoporosis should discuss screening with their health care providers. Ask your health care provider whether you should take a calcium supplement or vitamin D to reduce the rate of osteoporosis.  Menopause can be associated with physical symptoms and risks. Hormone replacement therapy is available to decrease symptoms and risks. You should talk to your health care provider about whether hormone replacement therapy is right for you.  Use sunscreen. Apply sunscreen liberally and repeatedly throughout the day. You should seek shade when your shadow is shorter than you. Protect yourself by wearing long sleeves, pants, a wide-brimmed hat, and sunglasses year round, whenever you are outdoors.  Once a month, do a whole body skin exam, using a mirror to look at the skin on your back. Tell your health care provider of new moles, moles that have irregular borders, moles that are larger than a pencil eraser, or moles that have changed in shape or color.  Stay current with required vaccines (immunizations).  Influenza vaccine. All adults  should be immunized every year.  Tetanus, diphtheria, and acellular pertussis (Td, Tdap) vaccine. Pregnant women should receive 1 dose of Tdap vaccine during each pregnancy. The dose should be obtained regardless of the length of time since the last dose. Immunization is preferred during the 27th-36th week of gestation. An adult who has not previously received Tdap or who does not know her vaccine status should receive 1 dose of Tdap. This initial dose should be followed by tetanus and diphtheria toxoids (Td) booster doses every 10 years. Adults with an unknown or incomplete history of completing a 3-dose immunization series with Td-containing vaccines should begin or complete a primary immunization series including a Tdap dose. Adults should receive a Td booster every 10 years.  Varicella vaccine. An adult without evidence of immunity to varicella should receive 2 doses or a second dose if she has previously received 1 dose. Pregnant females who do not have evidence of immunity should receive the first dose after pregnancy. This first dose should be obtained before leaving the health care facility. The second dose should be obtained 4-8 weeks after the first dose.  Human papillomavirus (HPV) vaccine. Females aged 13-26 years who have not received the vaccine previously should obtain the 3-dose series. The vaccine is not recommended for use in pregnant females. However, pregnancy testing is not needed before receiving a dose. If a female is found to be pregnant after receiving a dose, no treatment is needed. In that case, the remaining doses should be delayed until after the pregnancy. Immunization is recommended for any person with an immunocompromised condition through the age of 30 years if she did not get any or all doses earlier. During the 3-dose series, the second dose should be obtained 4-8 weeks after the first dose. The third dose should be obtained 24 weeks after the first dose and 16 weeks after  the second dose.  Zoster vaccine. One dose is recommended for adults aged 51 years or older unless certain conditions are present.  Measles, mumps, and rubella (MMR) vaccine. Adults born before [birth year] generally are considered immune to measles and mumps. Adults born in [birth year] or later should have 1 or more doses  of MMR vaccine unless there is a contraindication to the vaccine or there is laboratory evidence of immunity to each of the three diseases. A routine second dose of MMR vaccine should be obtained at least 28 days after the first dose for students attending postsecondary schools, health care workers, or international travelers. People who received inactivated measles vaccine or an unknown type of measles vaccine during 1963-1967 should receive 2 doses of MMR vaccine. People who received inactivated mumps vaccine or an unknown type of mumps vaccine before 1979 and are at high risk for mumps infection should consider immunization with 2 doses of MMR vaccine. For females of childbearing age, rubella immunity should be determined. If there is no evidence of immunity, females who are not pregnant should be vaccinated. If there is no evidence of immunity, females who are pregnant should delay immunization until after pregnancy. Unvaccinated health care workers born before [birth year] who lack laboratory evidence of measles, mumps, or rubella immunity or laboratory confirmation of disease should consider measles and mumps immunization with 2 doses of MMR vaccine or rubella immunization with 1 dose of MMR vaccine.  Pneumococcal 13-valent conjugate (PCV13) vaccine. When indicated, a person who is uncertain of her immunization history and has no record of immunization should receive the PCV13 vaccine. An adult aged 28 years or older who has certain medical conditions and has not been previously immunized should receive 1 dose of PCV13 vaccine. This PCV13 should be followed with a dose of pneumococcal polysaccharide (PPSV23)  vaccine. The PPSV23 vaccine dose should be obtained at least 8 weeks after the dose of PCV13 vaccine. An adult aged 74 years or older who has certain medical conditions and previously received 1 or more doses of PPSV23 vaccine should receive 1 dose of PCV13. The PCV13 vaccine dose should be obtained 1 or more years after the last PPSV23 vaccine dose.  Pneumococcal polysaccharide (PPSV23) vaccine. When PCV13 is also indicated, PCV13 should be obtained first. All adults aged 17 years and older should be immunized. An adult younger than age 38 years who has certain medical conditions should be immunized. Any person who resides in a nursing home or long-term care facility should be immunized. An adult smoker should be immunized. People with an immunocompromised condition and certain other conditions should receive both PCV13 and PPSV23 vaccines. People with human immunodeficiency virus (HIV) infection should be immunized as soon as possible after diagnosis. Immunization during chemotherapy or radiation therapy should be avoided. Routine use of PPSV23 vaccine is not recommended for American Indians, [place] Natives, or people younger than 65 years unless there are medical conditions that require PPSV23 vaccine. When indicated, people who have unknown immunization and have no record of immunization should receive PPSV23 vaccine. One-time revaccination 5 years after the first dose of PPSV23 is recommended for people aged 19-64 years who have chronic kidney failure, nephrotic syndrome, asplenia, or immunocompromised conditions. People who received 1-2 doses of PPSV23 before age 72 years should receive another dose of PPSV23 vaccine at age 28 years or later if at least 5 years have passed since the previous dose. Doses of PPSV23 are not needed for people immunized with PPSV23 at or after age 72 years.  Meningococcal vaccine. Adults with asplenia or persistent complement component deficiencies should receive 2 doses of  quadrivalent meningococcal conjugate (MenACWY-D) vaccine. The doses should be obtained at least 2 months apart. Microbiologists working with certain meningococcal bacteria, [place] recruits, people at risk during an outbreak, and people who travel to or  live in countries with a high rate of meningitis should be immunized. A first-year college student up through age 30 years who is living in a residence hall should receive a dose if she did not receive a dose on or after her 16th birthday. Adults who have certain high-risk conditions should receive one or more doses of vaccine.  Hepatitis A vaccine. Adults who wish to be protected from this disease, have certain high-risk conditions, work with hepatitis A-infected animals, work in hepatitis A research labs, or travel to or work in countries with a high rate of hepatitis A should be immunized. Adults who were previously unvaccinated and who anticipate close contact with an international adoptee during the first 60 days after arrival in the [place] from a country with a high rate of hepatitis A should be immunized.  Hepatitis B vaccine. Adults who wish to be protected from this disease, have certain high-risk conditions, may be exposed to blood or other infectious body fluids, are household contacts or sex partners of hepatitis B positive people, are clients or workers in certain care facilities, or travel to or work in countries with a high rate of hepatitis B should be immunized.  Haemophilus influenzae type b (Hib) vaccine. A previously unvaccinated person with asplenia or sickle cell disease or having a scheduled splenectomy should receive 1 dose of Hib vaccine. Regardless of previous immunization, a recipient of a hematopoietic stem cell transplant should receive a 3-dose series 6-12 months after her successful transplant. Hib vaccine is not recommended for adults with HIV infection. Preventive Services / Frequency Ages 73 to 60 years  Blood  pressure check.** / Every 1 to 2 years.  Lipid and cholesterol check.** / Every 5 years beginning at age 45.  Clinical breast exam.** / Every 3 years for women in their 55s and 37s.  BRCA-related cancer risk assessment.** / For women who have family members with a BRCA-related cancer (breast, ovarian, tubal, or peritoneal cancers).  Pap test.** / Every 2 years from ages 42 through 63. Every 3 years starting at age 46 through age 71 or 61 with a history of 3 consecutive normal Pap tests.  HPV screening.** / Every 3 years from ages 65 through ages 10 to 83 with a history of 3 consecutive normal Pap tests.  Hepatitis C blood test.** / For any individual with known risks for hepatitis C.  Skin self-exam. / Monthly.  Influenza vaccine. / Every year.  Tetanus, diphtheria, and acellular pertussis (Tdap, Td) vaccine.** / Consult your health care provider. Pregnant women should receive 1 dose of Tdap vaccine during each pregnancy. 1 dose of Td every 10 years.  Varicella vaccine.** / Consult your health care provider. Pregnant females who do not have evidence of immunity should receive the first dose after pregnancy.  HPV vaccine. / 3 doses over 6 months, if 55 and younger. The vaccine is not recommended for use in pregnant females. However, pregnancy testing is not needed before receiving a dose.  Measles, mumps, rubella (MMR) vaccine.** / You need at least 1 dose of MMR if you were born in 1957 or later. You may also need a 2nd dose. For females of childbearing age, rubella immunity should be determined. If there is no evidence of immunity, females who are not pregnant should be vaccinated. If there is no evidence of immunity, females who are pregnant should delay immunization until after pregnancy.  Pneumococcal 13-valent conjugate (PCV13) vaccine.** / Consult your health care provider.  Pneumococcal polysaccharide (PPSV23)  vaccine.** / 1 to 2 doses if you smoke cigarettes or if you have certain  conditions.  Meningococcal vaccine.** / 1 dose if you are age 62 to 64 years and a Market researcher living in a residence hall, or have one of several medical conditions, you need to get vaccinated against meningococcal disease. You may also need additional booster doses.  Hepatitis A vaccine.** / Consult your health care provider.  Hepatitis B vaccine.** / Consult your health care provider.  Haemophilus influenzae type b (Hib) vaccine.** / Consult your health care provider. Ages 77 to 19 years  Blood pressure check.** / Every 1 to 2 years.  Lipid and cholesterol check.** / Every 5 years beginning at age 80 years.  Lung cancer screening. / Every year if you are aged 46-80 years and have a 30-pack-year history of smoking and currently smoke or have quit within the past 15 years. Yearly screening is stopped once you have quit smoking for at least 15 years or develop a health problem that would prevent you from having lung cancer treatment.  Clinical breast exam.** / Every year after age 19 years.  BRCA-related cancer risk assessment.** / For women who have family members with a BRCA-related cancer (breast, ovarian, tubal, or peritoneal cancers).  Mammogram.** / Every year beginning at age 43 years and continuing for as long as you are in good health. Consult with your health care provider.  Pap test.** / Every 3 years starting at age 21 years through age 42 or 97 years with a history of 3 consecutive normal Pap tests.  HPV screening.** / Every 3 years from ages 5 years through ages 74 to 53 years with a history of 3 consecutive normal Pap tests.  Fecal occult blood test (FOBT) of stool. / Every year beginning at age 37 years and continuing until age 47 years. You may not need to do this test if you get a colonoscopy every 10 years.  Flexible sigmoidoscopy or colonoscopy.** / Every 5 years for a flexible sigmoidoscopy or every 10 years for a colonoscopy beginning at age 34 years  and continuing until age 43 years.  Hepatitis C blood test.** / For all people born from [birth year] through 1963 and any individual with known risks for hepatitis C.  Skin self-exam. / Monthly.  Influenza vaccine. / Every year.  Tetanus, diphtheria, and acellular pertussis (Tdap/Td) vaccine.** / Consult your health care provider. Pregnant women should receive 1 dose of Tdap vaccine during each pregnancy. 1 dose of Td every 10 years.  Varicella vaccine.** / Consult your health care provider. Pregnant females who do not have evidence of immunity should receive the first dose after pregnancy.  Zoster vaccine.** / 1 dose for adults aged 31 years or older.  Measles, mumps, rubella (MMR) vaccine.** / You need at least 1 dose of MMR if you were born in 1957 or later. You may also need a 2nd dose. For females of childbearing age, rubella immunity should be determined. If there is no evidence of immunity, females who are not pregnant should be vaccinated. If there is no evidence of immunity, females who are pregnant should delay immunization until after pregnancy.  Pneumococcal 13-valent conjugate (PCV13) vaccine.** / Consult your health care provider.  Pneumococcal polysaccharide (PPSV23) vaccine.** / 1 to 2 doses if you smoke cigarettes or if you have certain conditions.  Meningococcal vaccine.** / Consult your health care provider.  Hepatitis A vaccine.** / Consult your health care provider.  Hepatitis B vaccine.** /  Consult your health care provider.  Haemophilus influenzae type b (Hib) vaccine.** / Consult your health care provider. Ages 91 years and over  Blood pressure check.** / Every 1 to 2 years.  Lipid and cholesterol check.** / Every 5 years beginning at age 15 years.  Lung cancer screening. / Every year if you are aged 82-80 years and have a 30-pack-year history of smoking and currently smoke or have quit within the past 15 years. Yearly screening is stopped once you have quit smoking  for at least 15 years or develop a health problem that would prevent you from having lung cancer treatment.  Clinical breast exam.** / Every year after age 36 years.  BRCA-related cancer risk assessment.** / For women who have family members with a BRCA-related cancer (breast, ovarian, tubal, or peritoneal cancers).  Mammogram.** / Every year beginning at age 69 years and continuing for as long as you are in good health. Consult with your health care provider.  Pap test.** / Every 3 years starting at age 58 years through age 66 or 38 years with 3 consecutive normal Pap tests. Testing can be stopped between 65 and 70 years with 3 consecutive normal Pap tests and no abnormal Pap or HPV tests in the past 10 years.  HPV screening.** / Every 3 years from ages 68 years through ages 64 or 32 years with a history of 3 consecutive normal Pap tests. Testing can be stopped between 65 and 70 years with 3 consecutive normal Pap tests and no abnormal Pap or HPV tests in the past 10 years.  Fecal occult blood test (FOBT) of stool. / Every year beginning at age 48 years and continuing until age 63 years. You may not need to do this test if you get a colonoscopy every 10 years.  Flexible sigmoidoscopy or colonoscopy.** / Every 5 years for a flexible sigmoidoscopy or every 10 years for a colonoscopy beginning at age 34 years and continuing until age 94 years.  Hepatitis C blood test.** / For all people born from [birth year] through 1963 and any individual with known risks for hepatitis C.  Osteoporosis screening.** / A one-time screening for women ages 10 years and over and women at risk for fractures or osteoporosis.  Skin self-exam. / Monthly.  Influenza vaccine. / Every year.  Tetanus, diphtheria, and acellular pertussis (Tdap/Td) vaccine.** / 1 dose of Td every 10 years.  Varicella vaccine.** / Consult your health care provider.  Zoster vaccine.** / 1 dose for adults aged 31 years or older.  Pneumococcal  13-valent conjugate (PCV13) vaccine.** / Consult your health care provider.  Pneumococcal polysaccharide (PPSV23) vaccine.** / 1 dose for all adults aged 34 years and older.  Meningococcal vaccine.** / Consult your health care provider.  Hepatitis A vaccine.** / Consult your health care provider.  Hepatitis B vaccine.** / Consult your health care provider.  Haemophilus influenzae type b (Hib) vaccine.** / Consult your health care provider. ** Family history and personal history of risk and conditions may change your health care provider's recommendations. Document Released: 12/10/2001 Document Revised: 02/28/2014 Document Reviewed: 03/11/2011 [facility] Patient Information 2015 [place], [place]. This information is not intended to replace advice given to you by your health care provider. Make sure you discuss any questions you have with your health care provider.

## 2014-11-23 LAB — COMPREHENSIVE METABOLIC PANEL
ALT: 12 U/L (ref 0–35)
AST: 18 U/L (ref 0–37)
Albumin: 3.9 g/dL (ref 3.5–5.2)
Alkaline Phosphatase: 74 U/L (ref 39–117)
BUN: 28 mg/dL — ABNORMAL HIGH (ref 6–23)
CALCIUM: 9.8 mg/dL (ref 8.4–10.5)
CO2: 25 meq/L (ref 19–32)
CREATININE: 1.02 mg/dL (ref 0.40–1.20)
Chloride: 106 mEq/L (ref 96–112)
GFR: 54.24 mL/min — ABNORMAL LOW (ref >60.00)
GLUCOSE: 96 mg/dL (ref 70–99)
Potassium: 5.7 mEq/L — ABNORMAL HIGH (ref 3.5–5.1)
SODIUM: 139 meq/L (ref 135–145)
TOTAL PROTEIN: 7.3 g/dL (ref 6.0–8.3)
Total Bilirubin: 0.4 mg/dL (ref 0.2–1.2)

## 2014-11-23 LAB — CBC
HCT: 44.7 % (ref 36.0–46.0)
HEMOGLOBIN: 15.3 g/dL — AB (ref 12.0–15.0)
MCHC: 34.2 g/dL (ref 30.0–36.0)
MCV: 94.1 fl (ref 78.0–100.0)
Platelets: 245 10*3/uL (ref 150.0–400.0)
RBC: 4.75 Mil/uL (ref 3.87–5.11)
RDW: 13.4 % (ref 11.5–15.5)
WBC: 9.3 10*3/uL (ref 4.0–10.5)

## 2014-11-23 LAB — VITAMIN D 25 HYDROXY (VIT D DEFICIENCY, FRACTURES): VITD: 19.24 ng/mL — AB (ref 30.00–100.00)

## 2014-11-23 LAB — TSH: TSH: 4.44 u[IU]/mL (ref 0.35–4.50)

## 2014-11-25 ENCOUNTER — Other Ambulatory Visit: Payer: Self-pay

## 2014-11-25 ENCOUNTER — Telehealth: Payer: Self-pay

## 2014-11-25 DIAGNOSIS — E876 Hypokalemia: Secondary | ICD-10-CM

## 2014-11-25 DIAGNOSIS — R7989 Other specified abnormal findings of blood chemistry: Secondary | ICD-10-CM

## 2014-11-25 DIAGNOSIS — E871 Hypo-osmolality and hyponatremia: Secondary | ICD-10-CM

## 2014-11-25 MED ORDER — ERGOCALCIFEROL 1.25 MG (50000 UT) PO CAPS: 50000.0000 [IU] | ORAL_CAPSULE | ORAL | Status: DC

## 2014-11-25 NOTE — Telephone Encounter (Signed)
Dr B. I tried to order a future Vitamin D for this patient. It would not let me because she had already had her insurance allotment for the year. Do you have a way that it will let us order it?

## 2014-11-27 ENCOUNTER — Encounter: Payer: Self-pay | Admitting: Family Medicine

## 2014-11-27 DIAGNOSIS — Z0000 Encounter for general adult medical examination without abnormal findings: Secondary | ICD-10-CM

## 2014-11-27 HISTORY — DX: Encounter for general adult medical examination without abnormal findings: Z00.00

## 2014-11-27 NOTE — Assessment & Plan Note (Signed)
Rate controlled, tolerating Pradaxa. asymptomatic

## 2014-11-27 NOTE — Assessment & Plan Note (Signed)
Encouraged DASH diet, decrease po intake and increase exercise as tolerated. Needs 7-8 hours of sleep nightly. Avoid trans fats, eat small, frequent meals every 4-5 hours with lean proteins, complex carbs and healthy fats. Minimize simple carbs 

## 2014-11-27 NOTE — Assessment & Plan Note (Addendum)
Improved on recheck, asymptomatic and patient reports controlled number elsewhere.

## 2014-11-27 NOTE — Assessment & Plan Note (Signed)
Patient denies any difficulties at home. No trouble with ADLs, depression or falls. No recent changes to vision or hearing. Is UTD with immunizations. Is UTD with screening. Discussed Advanced Directives, patient agrees to bring [place] copies of documents if can. Encouraged heart healthy diet, exercise as tolerated and adequate sleep. Follows with pulmonology, Dr Gwenette Greet Follow with cardiology, Dr Rance Muir with Dr Matthew Saras Does not participate in [facility], pap or colonoscopy screening at this time.

## 2014-11-27 NOTE — Assessment & Plan Note (Signed)
Low level with labs today, start on 50000IU weekly. Recheck in 3 months

## 2014-11-27 NOTE — Assessment & Plan Note (Signed)
Avoid offending foods, start probiotics. Do not eat large meals in late evening and consider raising [place] of bed.  

## 2014-12-01 ENCOUNTER — Other Ambulatory Visit (INDEPENDENT_AMBULATORY_CARE_PROVIDER_SITE_OTHER): Payer: Medicare Other

## 2014-12-01 DIAGNOSIS — E871 Hypo-osmolality and hyponatremia: Secondary | ICD-10-CM | POA: Diagnosis not present

## 2014-12-01 LAB — COMPREHENSIVE METABOLIC PANEL
ALBUMIN: 3.8 g/dL (ref 3.5–5.2)
ALT: 10 U/L (ref 0–35)
AST: 15 U/L (ref 0–37)
Alkaline Phosphatase: 75 U/L (ref 39–117)
BUN: 22 mg/dL (ref 6–23)
CALCIUM: 9.4 mg/dL (ref 8.4–10.5)
CO2: 24 mEq/L (ref 19–32)
CREATININE: 0.99 mg/dL (ref 0.40–1.20)
Chloride: 106 mEq/L (ref 96–112)
GFR: 56.14 mL/min — ABNORMAL LOW (ref >60.00)
Glucose, Bld: 140 mg/dL — ABNORMAL HIGH (ref 70–99)
Potassium: 4.9 mEq/L (ref 3.5–5.1)
Sodium: 139 mEq/L (ref 135–145)
Total Bilirubin: 0.6 mg/dL (ref 0.2–1.2)
Total Protein: 6.9 g/dL (ref 6.0–8.3)

## 2015-01-04 ENCOUNTER — Other Ambulatory Visit: Payer: Medicare Other

## 2015-01-04 ENCOUNTER — Telehealth: Payer: Self-pay

## 2015-01-04 DIAGNOSIS — E559 Vitamin D deficiency, unspecified: Secondary | ICD-10-CM

## 2015-01-04 NOTE — Telephone Encounter (Signed)
Answering for Dr. B who is out of office.  I only see one prior Vitamin D level in our system.  She may be having drawn elsewhere.  Please call patient to see.  If not, tell her we will order but Medicare may charge her for this. If she gives the ok, replace order and use the advanced waiver button on the order to bill medicare.  If she refuses, we will wait for Dr. B response.

## 2015-01-04 NOTE — Telephone Encounter (Signed)
[facility] called requesting order for Vitamin D levels to be drawn.  Attempted to place order (see lab notes and office notes from 11/22/14), however, unable to place order.  Notification states Vitamin D level can only be drawn 4 times per year.  Please advise.

## 2015-01-04 NOTE — Telephone Encounter (Signed)
Pt states she's not having Vitamin D drawn anywhere else.  She states that she does not want to pay out of pocket.  She says she feels fine and would rather not have it drawn.

## 2015-01-06 ENCOUNTER — Encounter: Payer: Self-pay | Admitting: Medical

## 2015-01-06 ENCOUNTER — Ambulatory Visit (INDEPENDENT_AMBULATORY_CARE_PROVIDER_SITE_OTHER): Payer: Medicare Other | Admitting: Medical

## 2015-01-06 VITALS — BP 132/90 | HR 82 | Temp 97.6°F | Ht 67.0 in | Wt 235.4 lb

## 2015-01-06 DIAGNOSIS — J209 Acute bronchitis, unspecified: Secondary | ICD-10-CM | POA: Diagnosis not present

## 2015-01-06 MED ORDER — AZITHROMYCIN 250 MG PO TABS: ORAL_TABLET | ORAL | Status: DC

## 2015-01-06 MED ORDER — BENZONATATE 100 MG PO CAPS: 100.0000 mg | ORAL_CAPSULE | Freq: Three times a day (TID) | ORAL | Status: DC | PRN

## 2015-01-06 MED ORDER — FLUTICASONE PROPIONATE 50 MCG/ACT NA SUSP: 2.0000 | Freq: Every day | NASAL | Status: DC

## 2015-01-06 NOTE — Progress Notes (Signed)
Pre visit review using our clinic review tool, if applicable. No additional management support is needed unless otherwise documented below in the visit note. 

## 2015-01-06 NOTE — Patient Instructions (Signed)
Acute bronchitis Following uri vs allergies x 1 wk. Rx  Antibiotic azithromcyin, benzonatate for cough and flonase for nasal congestion.  Follow up 7 days any persisting symptoms or as needed.

## 2015-01-06 NOTE — Progress Notes (Signed)
Subjective:    Patient ID: Gina Reilly, female    DOB: 15-Sep-1924, 81 y.o.   MRN: [id number]  HPI   Pt in feeling congestion for one week. Has cough. Cough is productive. No fever or chills. No sinus pressure and no ear pain.  No myalgias.  No sneezing. No itching eyes.  Pt states not sick in a long time. Pt does not smoke.    Review of Systems  Constitutional: Negative for fever, chills and fatigue.  HENT: Positive for congestion, postnasal drip and rhinorrhea. Negative for sore throat and tinnitus.   Respiratory: Positive for cough. Negative for chest tightness, shortness of breath and wheezing.   Cardiovascular: Negative for chest pain and palpitations.  Musculoskeletal: Negative for back pain.  Neurological: Negative for numbness and headaches.  Hematological: Negative for adenopathy. Does not bruise/bleed easily.   Past Medical History  Diagnosis Date  . Anemia   . GERD (gastroesophageal reflux disease)   . Thyroid disease     hypothroidism  . Arthritis     osteo  . Hyperlipidemia   . Vitamin D deficiency   . Chicken pox as a child  . Mumps as a child  . Measles as a child  . Hypertension   . Skin cancer     left temple  . Obese   . Ovarian cyst   . Pelvic mass in female   . Bronchitis 12/25/2011  . Contusion of leg, left 01/16/2012  . Peripheral neuropathy 02/26/2012  . Hyperglycemia 01/10/2010    Qualifier: Diagnosis of  By: Niel Hummer MD, [place] Atrial fibrillation     Status post TEE guided DCCV 07/2010:  Transesophageal Echocardiogram 07/2010: EF 60-65% mild MR.;  Amiodarone;  Pradaxa  . Hypoxia 04/14/2012  . Apnea, sleep 04/24/2012  . Hyperthyroidism 02/09/2009    Qualifier: Diagnosis of  By: Niel Hummer MD, [place] Intermittent claudication 05/22/2012  . Constipation 11/22/2014  . Precancerous skin lesion   . Medicare annual wellness visit, subsequent 11/27/2014    Follows with pulmonology, Dr Gwenette Greet Follow with cardiology, Dr Rance Muir  with Dr Matthew Saras Does not participate in [facility], pap or colonoscopy screening at this time.    History   Social History  . Marital Status: Widowed    Spouse Name: N/A  . Number of Children: 14  . Years of Education: N/A   Occupational History  . retired    Social History Main Topics  . Smoking status: Former Smoker -- 0.30 packs/day for 5 years    Types: Cigarettes    Quit date: 10/28/1973  . Smokeless tobacco: Never Used  . Alcohol Use: Yes     Comment: socially  . Drug Use: No  . Sexual Activity: Not on file   Other Topics Concern  . Not on file   Social History Narrative    Past Surgical History  Procedure Laterality Date  . Cataract extraction      Dr. Satira Sark  . Total hip arthroplasty  2003    left  . Abdominal hysterectomy    . Skin biopsy      left temple. cancerous    Family History  Problem Relation Age of Onset  . Stroke Mother   . Cancer Father     prostate  . Hypertension Brother   . Cancer Maternal Grandmother   . Hypertension Brother   . Heart disease Brother   . Obesity Brother   . Hypertension Brother   .  Hyperlipidemia Brother   . Heart disease Brother   . Kidney disease Brother   . Cancer Daughter     kidney- spine and blood stream  . Heart disease Son     valvular heart disease  . Prostate cancer Brother     Allergies  Allergen Reactions  . Amiodarone Other (See Comments)    Affecting the Lungs  . Aspercreme [Trolamine Salicylate]     Current Outpatient Prescriptions on File Prior to Visit  Medication Sig Dispense Refill  . acetaminophen (TYLENOL) 500 MG tablet Take 2 tablets (1,000 mg total) by mouth every 8 (eight) hours as needed for pain (max of 3000 mg in 24 hours). 30 tablet 0  . diltiazem (CARDIZEM CD) 300 MG 24 hr capsule Take 1 capsule (300 mg total) by mouth daily. 90 capsule 1  . ergocalciferol (VITAMIN D2) 50000 UNITS capsule Take 1 capsule (50,000 Units total) by mouth once a week. 4 capsule 3  . furosemide (LASIX) 20  MG tablet Take 1 tablet (20 mg total) by mouth as needed. 30 tablet 3  . losartan (COZAAR) 100 MG tablet Take 0.5 tablets (50 mg total) by mouth daily. In pm 90 tablet 0  . PRADAXA 150 MG CAPS capsule TAKE 1 CAPSULE TWICE DAILY 180 capsule 1  . Probiotic Product (PROBIOTIC DAILY PO) Take 1 tablet by mouth daily.    . ranitidine (ZANTAC) 150 MG tablet Take 1 tablet (150 mg total) by mouth daily as needed for heartburn. 30 tablet 1   No current facility-administered medications on file prior to visit.    BP 132/90 mmHg  Pulse 82  Temp(Src) 97.6 F (36.4 C) (Oral)  Ht 5\' 7"  (1.702 m)  Wt 235 lb 6.4 oz (106.777 kg)  BMI 36.86 kg/m2  SpO2 95%  LMP 12/25/2011      Objective:   Physical Exam  General  Mental Status - Alert. General Appearance - Well groomed. Not in acute distress.  Skin Rashes- No Rashes.  HEENT [place]- Normal. Ear Auditory Canal - Left- Normal. Right - Normal.Tympanic Membrane- Left- Normal. Right- Normal. Eye Sclera/Conjunctiva- Left- Normal. Right- Normal. Nose & Sinuses Nasal Mucosa- Left-  Mild boggy + Congested. Right-  Mild boggy + Congested. Mouth & Throat Lips: Upper Lip- Normal: no dryness, cracking, pallor, cyanosis, or vesicular eruption. Lower Lip-Normal: no dryness, cracking, pallor, cyanosis or vesicular eruption. Buccal Mucosa- Bilateral- No Aphthous ulcers. Oropharynx- No Discharge or Erythema. Tonsils: Characteristics- Bilateral- No Erythema or Congestion. Size/Enlargement- Bilateral- No enlargement. Discharge- bilateral-None.  Neck Neck- Supple. No Masses.   Chest and Lung Exam Auscultation: Breath Sounds:- even and unlabored, but bilateral upper lobe rhonchi.  Cardiovascular Auscultation:Rythm- Regular, rate and rhythm. Murmurs & Other Heart Sounds:Ausculatation of the heart reveal- No Murmurs.  Lymphatic Musial & Neck General [place] & Neck Lymphatics: Bilateral: Description- No Localized lymphadenopathy.       Assessment & Plan:

## 2015-01-06 NOTE — Assessment & Plan Note (Signed)
Following uri vs allergies x 1 wk. Rx  Antibiotic azithromcyin, benzonatate for cough and flonase for nasal congestion.  Follow up 7 days any persisting symptoms or as needed.

## 2015-02-13 NOTE — Progress Notes (Signed)
HPI: FU atrial fibrillation. Patient has a history of amiodarone lung toxicity as well; h/o bradycardia. Echocardiogram in June of 2013 showed normal LV function, mild left ventricular hypertrophy, grade 2 diastolic dysfunction, severe pulmonary hypertension and mild left atrial enlargement. Holter monitor in April of 2015 showed atrial fibrillation with mildly elevated rate. Cardizem was increased. Since she was last seen, she denies dyspnea, chest pain, palpitations, syncope or pedal edema.  Current Outpatient Prescriptions  Medication Sig Dispense Refill  . acetaminophen (TYLENOL) 500 MG tablet Take 2 tablets (1,000 mg total) by mouth every 8 (eight) hours as needed for pain (max of 3000 mg in 24 hours). 30 tablet 0  . diltiazem (CARDIZEM CD) 300 MG 24 hr capsule Take 1 capsule (300 mg total) by mouth daily. 90 capsule 1  . furosemide (LASIX) 20 MG tablet Take 1 tablet (20 mg total) by mouth as needed. 30 tablet 3  . losartan (COZAAR) 100 MG tablet Take 0.5 tablets (50 mg total) by mouth daily. In pm 90 tablet 0  . PRADAXA 150 MG CAPS capsule TAKE 1 CAPSULE TWICE DAILY 180 capsule 1  . Probiotic Product (PROBIOTIC DAILY PO) Take 1 tablet by mouth daily.    . ranitidine (ZANTAC) 150 MG tablet Take 1 tablet (150 mg total) by mouth daily as needed for heartburn. 30 tablet 1   No current facility-administered medications for this visit.     Past Medical History  Diagnosis Date  . Anemia   . GERD (gastroesophageal reflux disease)   . Thyroid disease     hypothroidism  . Arthritis     osteo  . Hyperlipidemia   . Vitamin D deficiency   . Chicken pox as a child  . Mumps as a child  . Measles as a child  . Hypertension   . Skin cancer     left temple  . Obese   . Ovarian cyst   . Pelvic mass in female   . Bronchitis 12/25/2011  . Contusion of leg, left 01/16/2012  . Peripheral neuropathy 02/26/2012  . Hyperglycemia 01/10/2010    Qualifier: Diagnosis of  By: Niel Hummer MD,  [place] Atrial fibrillation     Status post TEE guided DCCV 07/2010:  Transesophageal Echocardiogram 07/2010: EF 60-65% mild MR.;  Amiodarone;  Pradaxa  . Hypoxia 04/14/2012  . Apnea, sleep 04/24/2012  . Hyperthyroidism 02/09/2009    Qualifier: Diagnosis of  By: Niel Hummer MD, [place] Intermittent claudication 05/22/2012  . Constipation 11/22/2014  . Precancerous skin lesion   . Medicare annual wellness visit, subsequent 11/27/2014    Follows with pulmonology, Dr Gwenette Greet Follow with cardiology, Dr Rance Muir with Dr Matthew Saras Does not participate in [facility], pap or colonoscopy screening at this time.    Past Surgical History  Procedure Laterality Date  . Cataract extraction      Dr. Satira Sark  . Total hip arthroplasty  2003    left  . Abdominal hysterectomy    . Skin biopsy      left temple. cancerous    History   Social History  . Marital Status: Widowed    Spouse Name: N/A  . Number of Children: 70  . Years of Education: N/A   Occupational History  . retired    Social History Main Topics  . Smoking status: Former Smoker -- 0.30 packs/day for 5 years    Types: Cigarettes    Quit date: 10/28/1973  .  Smokeless tobacco: Never Used  . Alcohol Use: Yes     Comment: socially  . Drug Use: No  . Sexual Activity: Not on file   Other Topics Concern  . Not on file   Social History Narrative    ROS: constipation but no fevers or chills, productive cough, hemoptysis, dysphasia, odynophagia, melena, hematochezia, dysuria, hematuria, rash, seizure activity, orthopnea, PND, pedal edema, claudication. Remaining systems are negative.  Physical Exam: Well-developed obese in no acute distress.  Skin is warm and dry.  HEENT is normal.  Neck is supple.  Chest is clear to auscultation with normal expansion.  Cardiovascular exam is irregular and tachycardic Abdominal exam nontender or distended. No masses palpated. Extremities show no edema. neuro grossly intact  ECG atrial  fibrillation at a rate of 118. Low voltage. Cannot rule out prior inferior infarct.

## 2015-02-16 ENCOUNTER — Encounter: Payer: Self-pay | Admitting: Cardiology

## 2015-02-16 ENCOUNTER — Ambulatory Visit (INDEPENDENT_AMBULATORY_CARE_PROVIDER_SITE_OTHER): Payer: Medicare Other | Admitting: Cardiology

## 2015-02-16 VITALS — BP 130/70 | HR 118 | Ht 67.0 in | Wt 238.0 lb

## 2015-02-16 DIAGNOSIS — I10 Essential (primary) hypertension: Secondary | ICD-10-CM

## 2015-02-16 DIAGNOSIS — I4891 Unspecified atrial fibrillation: Secondary | ICD-10-CM

## 2015-02-16 LAB — CBC
HEMATOCRIT: 44.1 % (ref 36.0–46.0)
HEMOGLOBIN: 14.6 g/dL (ref 12.0–15.0)
MCH: 31.7 pg (ref 26.0–34.0)
MCHC: 33.1 g/dL (ref 30.0–36.0)
MCV: 95.9 fL (ref 78.0–100.0)
MPV: 10.2 fL (ref 8.6–12.4)
Platelets: 235 10*3/uL (ref 150–400)
RBC: 4.6 MIL/uL (ref 3.87–5.11)
RDW: 13.3 % (ref 11.5–15.5)
WBC: 8.1 10*3/uL (ref 4.0–10.5)

## 2015-02-16 MED ORDER — LOSARTAN POTASSIUM 25 MG PO TABS: 25.0000 mg | ORAL_TABLET | Freq: Every day | ORAL | Status: DC

## 2015-02-16 MED ORDER — DILTIAZEM HCL ER COATED BEADS 360 MG PO CP24: 360.0000 mg | ORAL_CAPSULE | Freq: Every day | ORAL | Status: DC

## 2015-02-16 NOTE — Assessment & Plan Note (Signed)
Given that we are increasing her Cardizem I will decrease Cozaar from 50 to 25 mg daily. Follow blood pressure and adjust medicines as needed.

## 2015-02-16 NOTE — Addendum Note (Signed)
Addended by: Cristopher Estimable on: 02/16/2015 09:21 AM   Modules accepted: Orders

## 2015-02-16 NOTE — Assessment & Plan Note (Signed)
Patient remains in atrial fibrillation. Increase Cardizem to 360 mg daily as heart rate is elevated. Continue pradaxa. Check hemoglobin. She has had her renal function checked recently.

## 2015-02-16 NOTE — Patient Instructions (Signed)
Your physician wants you to follow-up in: [place] will receive a reminder letter in the mail two months in advance. If you don't receive a letter, please call our office to schedule the follow-up appointment.   INCREASE DILTIAZEM TO 360 MG ONCE DAILY  DECREASE LOSARTAN TO 25 MG ONCE DAILY  Your physician recommends that you HAVE LAB WORK TODAY

## 2015-02-28 ENCOUNTER — Other Ambulatory Visit: Payer: Self-pay | Admitting: Cardiology

## 2015-03-04 ENCOUNTER — Other Ambulatory Visit: Payer: Self-pay | Admitting: Pharmacist Clinician (PhC)/ Clinical Pharmacy Specialist

## 2015-03-04 MED ORDER — DABIGATRAN ETEXILATE MESYLATE 150 MG PO CAPS: 150.0000 mg | ORAL_CAPSULE | Freq: Two times a day (BID) | ORAL | Status: DC

## 2015-04-25 DIAGNOSIS — R69 Illness, unspecified: Secondary | ICD-10-CM | POA: Diagnosis not present

## 2015-08-11 ENCOUNTER — Encounter: Payer: Self-pay | Admitting: *Deleted

## 2015-08-14 NOTE — Progress Notes (Signed)
HPI:  FU atrial fibrillation. Patient has a history of amiodarone lung toxicity as well; h/o bradycardia. Echocardiogram in June of 2013 showed normal LV function, mild left ventricular hypertrophy, grade 2 diastolic dysfunction, severe pulmonary hypertension and mild left atrial enlargement. Holter monitor in April of 2015 showed atrial fibrillation with mildly elevated rate. Cardizem was increased. Since she was last seen, She does note increased dyspnea on exertion. No orthopnea, PND, pedal edema, chest pain or syncope.  Current Outpatient Prescriptions  Medication Sig Dispense Refill  . acetaminophen (TYLENOL) 500 MG tablet Take 2 tablets (1,000 mg total) by mouth every 8 (eight) hours as needed for pain (max of 3000 mg in 24 hours). 30 tablet 0  . dabigatran (PRADAXA) 150 MG CAPS capsule Take 1 capsule (150 mg total) by mouth 2 (two) times daily. 180 capsule 1  . diltiazem (CARDIZEM CD) 360 MG 24 hr capsule Take 1 capsule (360 mg total) by mouth daily. 90 capsule 3  . furosemide (LASIX) 20 MG tablet Take 1 tablet (20 mg total) by mouth as needed. 30 tablet 3  . losartan (COZAAR) 25 MG tablet Take 1 tablet (25 mg total) by mouth daily. In pm 90 tablet 3  . Probiotic Product (PROBIOTIC DAILY PO) Take 1 tablet by mouth daily.    . ranitidine (ZANTAC) 150 MG tablet Take 1 tablet (150 mg total) by mouth daily as needed for heartburn. 30 tablet 1   No current facility-administered medications for this visit.     Past Medical History  Diagnosis Date  . Anemia   . GERD (gastroesophageal reflux disease)   . Thyroid disease     hypothroidism  . Arthritis     osteo  . Hyperlipidemia   . Vitamin D deficiency   . Chicken pox as a child  . Mumps as a child  . Measles as a child  . Hypertension   . Skin cancer     left temple  . Obese   . Ovarian cyst   . Pelvic mass in female   . Bronchitis 12/25/2011  . Contusion of leg, left 01/16/2012  . Peripheral neuropathy ([place]) 02/26/2012  .  Hyperglycemia 01/10/2010    Qualifier: Diagnosis of  By: Niel Hummer MD, [place] Atrial fibrillation ([place])     Status post TEE guided DCCV 07/2010:  Transesophageal Echocardiogram 07/2010: EF 60-65% mild MR.;  Amiodarone;  Pradaxa  . Hypoxia 04/14/2012  . Apnea, sleep 04/24/2012  . Hyperthyroidism 02/09/2009    Qualifier: Diagnosis of  By: Niel Hummer MD, [place] Intermittent claudication ([place]) 05/22/2012  . Constipation 11/22/2014  . Precancerous skin lesion   . Medicare annual wellness visit, subsequent 11/27/2014    Follows with pulmonology, Dr Gwenette Greet Follow with cardiology, Dr Rance Muir with Dr Matthew Saras Does not participate in [facility], pap or colonoscopy screening at this time.    Past Surgical History  Procedure Laterality Date  . Cataract extraction      Dr. Satira Sark  . Total hip arthroplasty  2003    left  . Abdominal hysterectomy    . Skin biopsy      left temple. cancerous    Social History   Social History  . Marital Status: Widowed    Spouse Name: N/A  . Number of Children: 28  . Years of Education: N/A   Occupational History  . retired    Social History Main Topics  . Smoking status: Former Smoker --  0.30 packs/day for 5 years    Types: Cigarettes    Quit date: 10/28/1973  . Smokeless tobacco: Never Used  . Alcohol Use: Yes     Comment: socially  . Drug Use: No  . Sexual Activity: Not on file   Other Topics Concern  . Not on file   Social History Narrative    ROS: no fevers or chills, productive cough, hemoptysis, dysphasia, odynophagia, melena, hematochezia, dysuria, hematuria, rash, seizure activity, orthopnea, PND, pedal edema, claudication. Remaining systems are negative.  Physical Exam: Well-developed obese in no acute distress.  Skin is warm and dry.  HEENT is normal.  Neck is supple.  Chest is clear to auscultation with normal expansion.  Cardiovascular exam is irregular and tachycardic Abdominal exam nontender or distended. No masses  palpated. Extremities show no edema. neuro grossly intact  ECG Atrial fibrillation with a rapid ventricular response, low voltage, occasional PVC or aberrantly conducted beat.

## 2015-08-15 ENCOUNTER — Encounter: Payer: Self-pay | Admitting: Cardiology

## 2015-08-15 ENCOUNTER — Ambulatory Visit (INDEPENDENT_AMBULATORY_CARE_PROVIDER_SITE_OTHER): Payer: Medicare Other | Admitting: Cardiology

## 2015-08-15 ENCOUNTER — Encounter: Payer: Self-pay | Admitting: *Deleted

## 2015-08-15 VITALS — BP 136/80 | HR 117 | Ht 67.0 in | Wt 237.5 lb

## 2015-08-15 DIAGNOSIS — I4891 Unspecified atrial fibrillation: Secondary | ICD-10-CM | POA: Diagnosis not present

## 2015-08-15 DIAGNOSIS — I10 Essential (primary) hypertension: Secondary | ICD-10-CM | POA: Diagnosis not present

## 2015-08-15 LAB — CBC
HCT: 44 % (ref 36.0–46.0)
Hemoglobin: 14.5 g/dL (ref 12.0–15.0)
MCH: 31.3 pg (ref 26.0–34.0)
MCHC: 33 g/dL (ref 30.0–36.0)
MCV: 95 fL (ref 78.0–100.0)
MPV: 10 fL (ref 8.6–12.4)
PLATELETS: 247 10*3/uL (ref 150–400)
RBC: 4.63 MIL/uL (ref 3.87–5.11)
RDW: 13.5 % (ref 11.5–15.5)
WBC: 9.4 10*3/uL (ref 4.0–10.5)

## 2015-08-15 LAB — BASIC METABOLIC PANEL
BUN: 21 mg/dL (ref 7–25)
CALCIUM: 9.3 mg/dL (ref 8.6–10.4)
CO2: 23 mmol/L (ref 20–31)
CREATININE: 0.95 mg/dL — AB (ref 0.60–0.88)
Chloride: 106 mmol/L (ref 98–110)
Glucose, Bld: 88 mg/dL (ref 65–99)
Potassium: 5.4 mmol/L — ABNORMAL HIGH (ref 3.5–5.3)
Sodium: 139 mmol/L (ref 135–146)

## 2015-08-15 NOTE — Patient Instructions (Signed)
Medication Instructions:   NO CHANGE  Labwork:  Your physician recommends that you HAVE LAB WORK TODAY  Testing/Procedures:  Your physician has requested that you have an echocardiogram. Echocardiography is a painless test that uses sound waves to create images of your heart. It provides your doctor with information about the size and shape of your heart and how well your heart's chambers and valves are working. This procedure takes approximately one hour. There are no restrictions for this procedure.   Your physician has recommended that you wear a 24 HOUR holter monitor. Holter monitors are medical devices that record the heart's electrical activity. Doctors most often use these monitors to diagnose arrhythmias. Arrhythmias are problems with the speed or rhythm of the heartbeat. The monitor is a small, portable device. You can wear one while you do your normal daily activities. This is usually used to diagnose what is causing palpitations/syncope (passing out).    Follow-Up:  Your physician wants you to follow-up in: [place] will receive a reminder letter in the mail two months in advance. If you don't receive a letter, please call our office to schedule the follow-up appointment.

## 2015-08-15 NOTE — Assessment & Plan Note (Signed)
Patient remains in atrial fibrillation. Her heart rate is elevated and she notes increased dyspnea on exertion. Plan 24-hour Holter monitor to make sure that rate is controlled. If not we will discontinue Cozaar and begin Toprol. Repeat echocardiogram to assess LV function. Continue pradaxa. Check hemoglobin and renal function.

## 2015-08-15 NOTE — Assessment & Plan Note (Signed)
Blood pressure controlled. Continue present medications. 

## 2015-08-22 ENCOUNTER — Ambulatory Visit (INDEPENDENT_AMBULATORY_CARE_PROVIDER_SITE_OTHER): Payer: Medicare Other

## 2015-08-22 ENCOUNTER — Ambulatory Visit (HOSPITAL_COMMUNITY): Payer: Medicare Other | Attending: Internal Medicine

## 2015-08-22 ENCOUNTER — Other Ambulatory Visit: Payer: Self-pay

## 2015-08-22 DIAGNOSIS — I358 Other nonrheumatic aortic valve disorders: Secondary | ICD-10-CM | POA: Insufficient documentation

## 2015-08-22 DIAGNOSIS — I4891 Unspecified atrial fibrillation: Secondary | ICD-10-CM | POA: Diagnosis not present

## 2015-08-22 DIAGNOSIS — I340 Nonrheumatic mitral (valve) insufficiency: Secondary | ICD-10-CM | POA: Diagnosis not present

## 2015-08-22 DIAGNOSIS — I071 Rheumatic tricuspid insufficiency: Secondary | ICD-10-CM | POA: Insufficient documentation

## 2015-08-22 DIAGNOSIS — I517 Cardiomegaly: Secondary | ICD-10-CM | POA: Insufficient documentation

## 2015-08-31 ENCOUNTER — Other Ambulatory Visit: Payer: Self-pay | Admitting: Cardiology

## 2015-09-26 ENCOUNTER — Telehealth: Payer: Self-pay | Admitting: Family Medicine

## 2015-09-26 NOTE — Telephone Encounter (Signed)
LM for pt to sch flu shot or notify if already received.

## 2015-12-29 NOTE — Telephone Encounter (Signed)
LM for pt to call and schedule flu shot or update records. Due for AWV with RN & schedule f/u with Dr. Charlett Blake

## 2016-01-25 ENCOUNTER — Encounter: Payer: Self-pay | Admitting: Family Medicine

## 2016-01-25 ENCOUNTER — Ambulatory Visit (INDEPENDENT_AMBULATORY_CARE_PROVIDER_SITE_OTHER): Payer: Medicare Other | Admitting: Family Medicine

## 2016-01-25 VITALS — BP 118/74 | HR 98 | Temp 97.7°F | Ht 67.0 in | Wt 234.5 lb

## 2016-01-25 DIAGNOSIS — I482 Chronic atrial fibrillation, unspecified: Secondary | ICD-10-CM

## 2016-01-25 DIAGNOSIS — J984 Other disorders of lung: Secondary | ICD-10-CM

## 2016-01-25 DIAGNOSIS — R1900 Intra-abdominal and pelvic swelling, mass and lump, unspecified site: Secondary | ICD-10-CM

## 2016-01-25 DIAGNOSIS — K219 Gastro-esophageal reflux disease without esophagitis: Secondary | ICD-10-CM | POA: Diagnosis not present

## 2016-01-25 DIAGNOSIS — E0590 Thyrotoxicosis, unspecified without thyrotoxic crisis or storm: Secondary | ICD-10-CM

## 2016-01-25 DIAGNOSIS — H547 Unspecified visual loss: Secondary | ICD-10-CM

## 2016-01-25 DIAGNOSIS — R739 Hyperglycemia, unspecified: Secondary | ICD-10-CM

## 2016-01-25 DIAGNOSIS — E559 Vitamin D deficiency, unspecified: Secondary | ICD-10-CM

## 2016-01-25 DIAGNOSIS — K5909 Other constipation: Secondary | ICD-10-CM

## 2016-01-25 DIAGNOSIS — T462X5A Adverse effect of other antidysrhythmic drugs, initial encounter: Secondary | ICD-10-CM

## 2016-01-25 DIAGNOSIS — G4733 Obstructive sleep apnea (adult) (pediatric): Secondary | ICD-10-CM

## 2016-01-25 NOTE — Progress Notes (Signed)
Pre visit review using our clinic review tool, if applicable. No additional management support is needed unless otherwise documented below in the visit note. 

## 2016-01-25 NOTE — Progress Notes (Signed)
Subjective:    Patient ID: Gina Reilly, female    DOB: 1924-08-13, 82 y.o.   MRN: [id number]  Chief Complaint  Patient presents with  . Follow-up    HPI Patient is in today for follow up.  Patient reports having restless leg syndrome at night states she has to get up to walk around to help with the restless leg syndrome.  Patient has some concern that she does not get enough sun.   Patient has been doing well, she does get short of breath sometimes.  She notes she is fine resting but with minimal ambulation is winded. No chest pain or recent illness, no recent hospitalization. Denies CP/palp/HA/congestion/fevers/GI or GU c/o. Taking meds as prescribed    Past Medical History  Diagnosis Date  . Anemia   . GERD (gastroesophageal reflux disease)   . Thyroid disease     hypothroidism  . Arthritis     osteo  . Hyperlipidemia   . Vitamin D deficiency   . Chicken pox as a child  . Mumps as a child  . Measles as a child  . Hypertension   . Skin cancer     left temple  . Obese   . Ovarian cyst   . Pelvic mass in female   . Bronchitis 12/25/2011  . Contusion of leg, left 01/16/2012  . Peripheral neuropathy ([place]) 02/26/2012  . Hyperglycemia 01/10/2010    Qualifier: Diagnosis of  By: Niel Hummer MD, [place] Atrial fibrillation ([place])     Status post TEE guided DCCV 07/2010:  Transesophageal Echocardiogram 07/2010: EF 60-65% mild MR.;  Amiodarone;  Pradaxa  . Hypoxia 04/14/2012  . Apnea, sleep 04/24/2012  . Hyperthyroidism 02/09/2009    Qualifier: Diagnosis of  By: Niel Hummer MD, [place] Intermittent claudication ([place]) 05/22/2012  . Constipation 11/22/2014  . Precancerous skin lesion   . Medicare annual wellness visit, subsequent 11/27/2014    Follows with pulmonology, Dr Gwenette Greet Follow with cardiology, Dr Rance Muir with Dr Matthew Saras Does not participate in [facility], pap or colonoscopy screening at this time.  . 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 Cataract extraction      Dr. Satira Sark  . Total hip arthroplasty  2003    left  . Abdominal hysterectomy    . Skin biopsy      left temple. cancerous    Family History  Problem Relation Age of Onset  . Stroke Mother   . Prostate cancer Father     prostate  . Hypertension Brother   . Cancer Maternal Grandmother   . Hypertension Brother   . Heart disease Brother   . Obesity Brother   . Hypertension Brother   . Hyperlipidemia Brother   . Heart disease Brother   . Kidney disease Brother   . Cancer Daughter     kidney- spine and blood stream  . Heart disease Son     valvular heart disease  . Prostate cancer Brother     Social History   Social History  . Marital Status: Widowed    Spouse Name: N/A  . Number of Children: 9  . Years of Education: N/A   Occupational History  . retired    Social History Main Topics  . Smoking status: Former  Smoker -- 0.30 packs/day for 5 years    Types: Cigarettes    Quit date: 10/28/1973  . Smokeless tobacco: Never Used  . Alcohol Use: Yes     Comment: socially  . Drug Use: No  . Sexual Activity: Not on file   Other Topics Concern  . Not on file   Social History Narrative    Outpatient Prescriptions Prior to Visit  Medication Sig Dispense Refill  . acetaminophen (TYLENOL) 500 MG tablet Take 2 tablets (1,000 mg total) by mouth every 8 (eight) hours as needed for pain (max of 3000 mg in 24 hours). 30 tablet 0  . dabigatran (PRADAXA) 150 MG CAPS capsule Take 1 capsule (150 mg total) by mouth 2 (two) times daily. 180 capsule 3  . diltiazem (CARDIZEM CD) 360 MG 24 hr capsule Take 1 capsule (360 mg total) by mouth daily. 90 capsule 3  . losartan (COZAAR) 25 MG tablet Take 1 tablet (25 mg total) by mouth daily. In pm 90 tablet 3  . Probiotic Product (PROBIOTIC DAILY PO) Take 1 tablet by  mouth daily.    . furosemide (LASIX) 20 MG tablet Take 1 tablet (20 mg total) by mouth as needed. (Patient not taking: Reported on 01/25/2016) 30 tablet 3  . ranitidine (ZANTAC) 150 MG tablet Take 1 tablet (150 mg total) by mouth daily as needed for heartburn. (Patient not taking: Reported on 01/25/2016) 30 tablet 1   No facility-administered medications prior to visit.    Allergies  Allergen Reactions  . Amiodarone Other (See Comments)    Affecting the Lungs  . Aspercreme [Trolamine Salicylate]     Review of Systems  Constitutional: Negative for fever and malaise/fatigue.  HENT: Negative for congestion.   Eyes: Negative for blurred vision.  Respiratory: Positive for shortness of breath.   Cardiovascular: Negative for chest pain, palpitations and leg swelling.  Gastrointestinal: Negative for nausea, abdominal pain and blood in stool.  Genitourinary: Negative for dysuria and frequency.  Musculoskeletal: Negative for falls.  Skin: Negative for rash.  Neurological: Negative for dizziness, loss of consciousness and headaches.  Endo/Heme/Allergies: Negative for environmental allergies.  Psychiatric/Behavioral: Negative for depression. The patient is not nervous/anxious.        Objective:    Physical Exam  Constitutional: She is oriented to person, place, and time. She appears well-developed and well-nourished. No distress.  HENT:  [place]: Normocephalic and atraumatic.  Eyes: Conjunctivae are normal.  Neck: Neck supple. No thyromegaly present.  Cardiovascular: Normal rate.   Murmur heard. Irregular rhythm  Pulmonary/Chest: Effort normal and breath sounds normal. No respiratory distress.  Abdominal: Soft. Bowel sounds are normal. She exhibits no distension and no mass. There is no tenderness.  Musculoskeletal: She exhibits no edema.  Lymphadenopathy:    She has no cervical adenopathy.  Neurological: She is alert and oriented to person, place, and time.  Skin: Skin is warm and dry.   Psychiatric: She has a normal mood and affect. Her behavior is normal.    BP 118/74 mmHg  Pulse 98  Temp(Src) 97.7 F (36.5 C) (Oral)  Ht 5\' 7"  (1.702 m)  Wt 234 lb 8 oz (106.369 kg)  BMI 36.72 kg/m2  SpO2 96%  LMP 12/25/2011 Wt Readings from Last 3 Encounters:  01/25/16 234 lb 8 oz (106.369 kg)  08/15/15 237 lb 8 oz (107.729 kg)  02/16/15 238 lb (107.956 kg)     Lab Results  Component Value Date   WBC 9.4 08/15/2015   HGB 14.5 08/15/2015  HCT 44.0 08/15/2015   PLT 247 08/15/2015   GLUCOSE 88 08/15/2015   CHOL 161 01/08/2012   TRIG 77.0 01/08/2012   HDL 59.70 01/08/2012   LDLDIRECT 128.4 02/09/2009   LDLCALC 86 01/08/2012   ALT 10 12/01/2014   AST 15 12/01/2014   NA 139 08/15/2015   K 5.4* 08/15/2015   CL 106 08/15/2015   CREATININE 0.95* 08/15/2015   BUN 21 08/15/2015   CO2 23 08/15/2015   TSH 4.44 11/22/2014   INR 1.32 08/17/2010   HGBA1C 5.8 09/09/2012    Lab Results  Component Value Date   TSH 4.44 11/22/2014   Lab Results  Component Value Date   WBC 9.4 08/15/2015   HGB 14.5 08/15/2015   HCT 44.0 08/15/2015   MCV 95.0 08/15/2015   PLT 247 08/15/2015   Lab Results  Component Value Date   NA 139 08/15/2015   K 5.4* 08/15/2015   CO2 23 08/15/2015   GLUCOSE 88 08/15/2015   BUN 21 08/15/2015   CREATININE 0.95* 08/15/2015   BILITOT 0.6 12/01/2014   ALKPHOS 75 12/01/2014   AST 15 12/01/2014   ALT 10 12/01/2014   PROT 6.9 12/01/2014   ALBUMIN 3.8 12/01/2014   CALCIUM 9.3 08/15/2015   GFR 56.14* 12/01/2014   Lab Results  Component Value Date   CHOL 161 01/08/2012   Lab Results  Component Value Date   HDL 59.70 01/08/2012   Lab Results  Component Value Date   LDLCALC 86 01/08/2012   Lab Results  Component Value Date   TRIG 77.0 01/08/2012   Lab Results  Component Value Date   CHOLHDL 3 01/08/2012   Lab Results  Component Value Date   HGBA1C 5.8 09/09/2012       Assessment & Plan:   Problem List Items Addressed This  Visit    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 Follows with cardiology      Constipation    Does metamucil daily will increase to bid with increased fluids and NOW probiotics      Decreased visual acuity   GERD   Relevant Orders   TSH   CBC   Lipid panel   Comprehensive metabolic panel   VITAMIN D 25 Hydroxy (Vit-D Deficiency, Fractures)   Hemoglobin A1c   Hyperglycemia    hgba1c acceptable, minimize simple carbs. Increase exercise as tolerated.       Relevant Orders   TSH   CBC   Lipid panel   Comprehensive metabolic panel   VITAMIN D 25 Hydroxy (Vit-D Deficiency, Fractures)   Hemoglobin A1c   Hyperthyroidism   Relevant Orders   TSH   CBC   Lipid panel   Comprehensive metabolic panel   VITAMIN D 25 Hydroxy (Vit-D Deficiency, Fractures)   Hemoglobin A1c   OSA (obstructive sleep apnea) - Primary    Uses Oxygen qhs with good results.      Pelvic mass in female    Ovarian cyst, decision was made not to remove during her 10s       Vitamin D deficiency    Does not take OTC Vitamin D, check level today      Relevant Orders   TSH   CBC   Lipid panel   Comprehensive metabolic panel   VITAMIN D 25 Hydroxy (Vit-D Deficiency, Fractures)   Hemoglobin A1c  I am having Ms. Kneale maintain her acetaminophen, Probiotic Product (PROBIOTIC DAILY PO), furosemide, ranitidine, losartan, diltiazem, and dabigatran.  No orders of the defined types were placed in this encounter.     Penni Homans, MD

## 2016-01-25 NOTE — Assessment & Plan Note (Signed)
No longer taking it and continues to SOB

## 2016-01-25 NOTE — Assessment & Plan Note (Signed)
hgba1c acceptable, minimize simple carbs. Increase exercise as tolerated.  

## 2016-01-25 NOTE — Assessment & Plan Note (Signed)
Ovarian cyst, decision was made not to remove during her 104s

## 2016-01-25 NOTE — Assessment & Plan Note (Signed)
Rate controlled, doing well. Follows with cardiology

## 2016-01-25 NOTE — Assessment & Plan Note (Signed)
Uses Tylenol infrequently, notes pain with movement across all joints. No swelling or warmth. Encouraged to stay active

## 2016-01-25 NOTE — Assessment & Plan Note (Signed)
Does not take OTC Vitamin D, check level today

## 2016-01-25 NOTE — Patient Instructions (Addendum)
Stay hydrated drink plenty of water Increase the metamucil. NOW Probiotic Vitamin(10 Strains). Available at [employer].  Muscle Cramps and Spasms Muscle cramps and spasms occur when a muscle or muscles tighten and you have no control over this tightening (involuntary muscle contraction). They are a common problem and can develop in any muscle. The most common place is in the calf muscles of the leg. Both muscle cramps and muscle spasms are involuntary muscle contractions, but they also have differences:   Muscle cramps are sporadic and painful. They may last a few seconds to a quarter of an hour. Muscle cramps are often more forceful and last longer than muscle spasms.  Muscle spasms may or may not be painful. They may also last just a few seconds or much longer. CAUSES  It is uncommon for cramps or spasms to be due to a serious underlying problem. In many cases, the cause of cramps or spasms is unknown. Some common causes are:   Overexertion.   Overuse from repetitive motions (doing the same thing over and over).   Remaining in a certain position for a long period of time.   Improper preparation, form, or technique while performing a sport or activity.   Dehydration.   Injury.   Side effects of some medicines.   Abnormally low levels of the salts and ions in your blood (electrolytes), especially potassium and calcium. This could happen if you are taking water pills (diuretics) or you are pregnant.  Some underlying medical problems can make it more likely to develop cramps or spasms. These include, but are not limited to:   Diabetes.   Parkinson disease.   Hormone disorders, such as thyroid problems.   Alcohol abuse.   Diseases specific to muscles, joints, and bones.   Blood vessel disease where not enough blood is getting to the muscles.  HOME CARE INSTRUCTIONS   Stay well hydrated. Drink enough water and fluids to keep your urine clear or pale  yellow.  It may be helpful to massage, stretch, and relax the affected muscle.  For tight or tense muscles, use a warm towel, heating pad, or hot shower water directed to the affected area.  If you are sore or have pain after a cramp or spasm, applying ice to the affected area may relieve discomfort.  Put ice in a plastic bag.  Place a towel between your skin and the bag.  Leave the ice on for 15-20 minutes, 03-04 times a day.  Medicines used to treat a known cause of cramps or spasms may help reduce their frequency or severity. Only take over-the-counter or prescription medicines as directed by your caregiver. SEEK MEDICAL CARE IF:  Your cramps or spasms get more severe, more frequent, or do not improve over time.  MAKE SURE YOU:   Understand these instructions.  Will watch your condition.  Will get help right away if you are not doing well or get worse.   This information is not intended to replace advice given to you by your health care provider. Make sure you discuss any questions you have with your health care provider.   Document Released: 04/05/2002 Document Revised: 02/08/2013 Document Reviewed: 09/30/2012 Elsevier Interactive Patient Education [employer].

## 2016-01-25 NOTE — Assessment & Plan Note (Signed)
Well controlled, no changes to meds. Encouraged heart healthy diet such as the DASH diet and exercise as tolerated.  °

## 2016-01-25 NOTE — Assessment & Plan Note (Signed)
Uses Oxygen qhs with good results.

## 2016-01-25 NOTE — Assessment & Plan Note (Signed)
Does metamucil daily will increase to bid with increased fluids and NOW probiotics

## 2016-01-26 LAB — COMPREHENSIVE METABOLIC PANEL
ALT: 10 U/L (ref 0–35)
AST: 16 U/L (ref 0–37)
Albumin: 4 g/dL (ref 3.5–5.2)
Alkaline Phosphatase: 65 U/L (ref 39–117)
BUN: 24 mg/dL — ABNORMAL HIGH (ref 6–23)
CALCIUM: 9.4 mg/dL (ref 8.4–10.5)
CHLORIDE: 104 meq/L (ref 96–112)
CO2: 26 meq/L (ref 19–32)
Creatinine, Ser: 1.04 mg/dL (ref 0.40–1.20)
GFR: 52.9 mL/min — AB (ref >60.00)
GLUCOSE: 144 mg/dL — AB (ref 70–99)
POTASSIUM: 4.7 meq/L (ref 3.5–5.1)
Sodium: 137 mEq/L (ref 135–145)
Total Bilirubin: 0.6 mg/dL (ref 0.2–1.2)
Total Protein: 7 g/dL (ref 6.0–8.3)

## 2016-01-26 LAB — LIPID PANEL
CHOL/HDL RATIO: 3
Cholesterol: 159 mg/dL (ref 0–200)
HDL: 52.1 mg/dL (ref >39.00)
LDL CALC: 79 mg/dL (ref 0–99)
NONHDL: 107.19
TRIGLYCERIDES: 140 mg/dL (ref 0.0–149.0)
VLDL: 28 mg/dL (ref 0.0–40.0)

## 2016-01-26 LAB — CBC
HEMATOCRIT: 42.7 % (ref 36.0–46.0)
Hemoglobin: 14.2 g/dL (ref 12.0–15.0)
MCHC: 33.4 g/dL (ref 30.0–36.0)
MCV: 94.3 fl (ref 78.0–100.0)
PLATELETS: 256 10*3/uL (ref 150.0–400.0)
RBC: 4.52 Mil/uL (ref 3.87–5.11)
RDW: 13.8 % (ref 11.5–15.5)
WBC: 9.1 10*3/uL (ref 4.0–10.5)

## 2016-01-26 LAB — HEMOGLOBIN A1C: HEMOGLOBIN A1C: 6.3 % (ref 4.6–6.5)

## 2016-01-26 LAB — TSH: TSH: 2.88 u[IU]/mL (ref 0.35–4.50)

## 2016-01-26 LAB — VITAMIN D 25 HYDROXY (VIT D DEFICIENCY, FRACTURES): VITD: 15.04 ng/mL — ABNORMAL LOW (ref 30.00–100.00)

## 2016-02-02 NOTE — Progress Notes (Signed)
HPI: FU atrial fibrillation. Patient has a history of amiodarone lung toxicity as well; h/o bradycardia. Echo 10/16 showed normal LV function, mild LAE, RAE,RVE, mild MR. Holter monitor 10/16 showed afib rate controlled. Since she was last seen, the patient has dyspnea with more extreme activities but not with routine activities. It is relieved with rest. It is not associated with chest pain. There is no orthopnea, PND or pedal edema. There is no syncope or palpitations. There is no exertional chest pain.    Current Outpatient Prescriptions  Medication Sig Dispense Refill  . acetaminophen (TYLENOL) 500 MG tablet Take 2 tablets (1,000 mg total) by mouth every 8 (eight) hours as needed for pain (max of 3000 mg in 24 hours). 30 tablet 0  . dabigatran (PRADAXA) 150 MG CAPS capsule Take 1 capsule (150 mg total) by mouth 2 (two) times daily. 180 capsule 3  . diltiazem (CARDIZEM CD) 360 MG 24 hr capsule Take 1 capsule (360 mg total) by mouth daily. 90 capsule 3  . furosemide (LASIX) 20 MG tablet Take 1 tablet (20 mg total) by mouth as needed. 30 tablet 3  . losartan (COZAAR) 25 MG tablet Take 1 tablet (25 mg total) by mouth daily. In pm 90 tablet 3  . Probiotic Product (PROBIOTIC DAILY PO) Take 1 tablet by mouth daily.    . ranitidine (ZANTAC) 150 MG tablet Take 1 tablet (150 mg total) by mouth daily as needed for heartburn. 30 tablet 1   No current facility-administered medications for this visit.     Past Medical History  Diagnosis Date  . Anemia   . GERD (gastroesophageal reflux disease)   . Thyroid disease     hypothroidism  . Arthritis     osteo  . Hyperlipidemia   . Vitamin D deficiency   . Chicken pox as a child  . Mumps as a child  . Measles as a child  . Hypertension   . Skin cancer     left temple  . Obese   . Ovarian cyst   . Pelvic mass in female   . Bronchitis 12/25/2011  . Contusion of leg, left 01/16/2012  . Peripheral neuropathy ([place]) 02/26/2012  . Hyperglycemia  01/10/2010    Qualifier: Diagnosis of  By: Niel Hummer MD, [place] Atrial fibrillation ([place])     Status post TEE guided DCCV 07/2010:  Transesophageal Echocardiogram 07/2010: EF 60-65% mild MR.;  Amiodarone;  Pradaxa  . Hypoxia 04/14/2012  . Apnea, sleep 04/24/2012  . Hyperthyroidism 02/09/2009    Qualifier: Diagnosis of  By: Niel Hummer MD, [place] Intermittent claudication ([place]) 05/22/2012  . Constipation 11/22/2014  . Precancerous skin lesion   . Medicare annual wellness visit, subsequent 11/27/2014    Follows with pulmonology, Dr Gwenette Greet Follow with cardiology, Dr Rance Muir with Dr Matthew Saras Does not participate in [facility], pap or colonoscopy screening at this time.  . 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 Cataract extraction      Dr. Satira Sark  . Total hip arthroplasty  2003    left  . Abdominal hysterectomy    . Skin biopsy      left temple. cancerous    Social History   Social History  . Marital Status: Widowed  Spouse Name: N/A  . Number of Children: 41  . Years of Education: N/A   Occupational History  . retired    Social History Main Topics  . Smoking status: Former Smoker -- 0.30 packs/day for 5 years    Types: Cigarettes    Quit date: 10/28/1973  . Smokeless tobacco: Never Used  . Alcohol Use: Yes     Comment: socially  . Drug Use: No  . Sexual Activity: Not on file   Other Topics Concern  . Not on file   Social History Narrative    Family History  Problem Relation Age of Onset  . Stroke Mother   . Prostate cancer Father     prostate  . Hypertension Brother   . Cancer Maternal Grandmother   . Hypertension Brother   . Heart disease Brother   . Obesity Brother   . Hypertension Brother   . Hyperlipidemia Brother   . Heart disease Brother   . Kidney disease Brother   .  Cancer Daughter     kidney- spine and blood stream  . Heart disease Son     valvular heart disease  . Prostate cancer Brother     ROS: constipation but no fevers or chills, productive cough, hemoptysis, dysphasia, odynophagia, melena, hematochezia, dysuria, hematuria, rash, seizure activity, orthopnea, PND, pedal edema, claudication. Remaining systems are negative.  Physical Exam: Well-developed obese in no acute distress.  Skin is warm and dry.  HEENT is normal.  Neck is supple.  Chest is clear to auscultation with normal expansion.  Cardiovascular exam is irregular Abdominal exam nontender or distended. No masses palpated. Extremities show no edema. neuro grossly intact  ECG Atrial fibrillation rate of 92. Cannot rule out prior anterior infarct.

## 2016-02-07 DIAGNOSIS — Z961 Presence of intraocular lens: Secondary | ICD-10-CM | POA: Diagnosis not present

## 2016-02-07 DIAGNOSIS — Z0100 Encounter for examination of eyes and vision without abnormal findings: Secondary | ICD-10-CM | POA: Diagnosis not present

## 2016-02-12 ENCOUNTER — Encounter: Payer: Self-pay | Admitting: Cardiology

## 2016-02-12 ENCOUNTER — Ambulatory Visit (INDEPENDENT_AMBULATORY_CARE_PROVIDER_SITE_OTHER): Payer: Medicare Other | Admitting: Cardiology

## 2016-02-12 VITALS — BP 114/80 | HR 92 | Ht 67.0 in | Wt 233.0 lb

## 2016-02-12 DIAGNOSIS — I4891 Unspecified atrial fibrillation: Secondary | ICD-10-CM

## 2016-02-12 DIAGNOSIS — I10 Essential (primary) hypertension: Secondary | ICD-10-CM

## 2016-02-12 NOTE — Patient Instructions (Signed)
Your physician wants you to follow-up in: 6 MONTHS WITH DR CRENSHAW You will receive a reminder letter in the mail two months in advance. If you don't receive a letter, please call our office to schedule the follow-up appointment.   If you need a refill on your cardiac medications before your next appointment, please call your pharmacy.  

## 2016-02-12 NOTE — Assessment & Plan Note (Signed)
Patient remains in permanent atrial fibrillation. Continue Cardizem for rate control. Continue pradaxa.

## 2016-02-12 NOTE — Assessment & Plan Note (Signed)
Blood pressure controlled. Continue present medications. 

## 2016-03-04 ENCOUNTER — Other Ambulatory Visit: Payer: Self-pay | Admitting: Cardiology

## 2016-03-04 NOTE — Telephone Encounter (Signed)
Rx request sent to pharmacy.  

## 2016-07-12 ENCOUNTER — Ambulatory Visit: Payer: Medicare Other | Admitting: *Deleted

## 2016-08-07 ENCOUNTER — Encounter: Payer: Self-pay | Admitting: Cardiology

## 2016-08-11 NOTE — Progress Notes (Signed)
HPI: FU atrial fibrillation. Patient has a history of amiodarone lung toxicity as well; h/o bradycardia. Echo 10/16 showed normal LV function, mild LAE, RAE,RVE, mild MR. Holter monitor 10/16 showed afib rate controlled. Since she was last seen, the patient has dyspnea with more extreme activities but not with routine activities. It is relieved with rest. It is not associated with chest pain. There is no orthopnea, PND or pedal edema. There is no syncope or palpitations. There is no exertional chest pain.   Current Outpatient Prescriptions  Medication Sig Dispense Refill  . acetaminophen (TYLENOL) 500 MG tablet Take 2 tablets (1,000 mg total) by mouth every 8 (eight) hours as needed for pain (max of 3000 mg in 24 hours). 30 tablet 0  . dabigatran (PRADAXA) 150 MG CAPS capsule Take 1 capsule (150 mg total) by mouth 2 (two) times daily. 180 capsule 3  . diltiazem (CARDIZEM CD) 360 MG 24 hr capsule TAKE 1 CAPSULE DAILY 90 capsule 2  . furosemide (LASIX) 20 MG tablet Take 1 tablet (20 mg total) by mouth as needed. 30 tablet 3  . losartan (COZAAR) 25 MG tablet TAKE 1 TABLET DAILY IN THE EVENING 90 tablet 2  . Probiotic Product (PROBIOTIC DAILY PO) Take 1 tablet by mouth daily.    . ranitidine (ZANTAC) 150 MG tablet Take 1 tablet (150 mg total) by mouth daily as needed for heartburn. 30 tablet 1   No current facility-administered medications for this visit.      Past Medical History:  Diagnosis Date  . 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 Anemia   . Apnea, sleep 04/24/2012  . Arthritis    osteo  . Atrial fibrillation ([place])    Status post TEE guided DCCV 07/2010:  Transesophageal Echocardiogram 07/2010: EF 60-65% mild MR.;  Amiodarone;  Pradaxa  . Bronchitis 12/25/2011  . Chicken pox as a child  . Constipation 11/22/2014  . Contusion of leg, left 01/16/2012  . GERD  (gastroesophageal reflux disease)   . Hyperglycemia 01/10/2010   Qualifier: Diagnosis of  By: Niel Hummer MD, [place] Hyperlipidemia   . Hypertension   . Hyperthyroidism 02/09/2009   Qualifier: Diagnosis of  By: Niel Hummer MD, [place] Hypoxia 04/14/2012  . Intermittent claudication ([place]) 05/22/2012  . Measles as a child  . Medicare annual wellness visit, subsequent 11/27/2014   Follows with pulmonology, Dr Gwenette Greet Follow with cardiology, Dr Rance Muir with Dr Matthew Saras Does not participate in [facility], pap or colonoscopy screening at this time.  . Mumps as a child  . Obese   . Ovarian cyst   . Pelvic mass in female   . Peripheral neuropathy ([place]) 02/26/2012  . Precancerous skin lesion   . Skin cancer    left temple  . Thyroid disease    hypothroidism  . Vitamin D deficiency     Past Surgical History:  Procedure Laterality Date  . ABDOMINAL HYSTERECTOMY    . CATARACT EXTRACTION     Dr. Satira Sark  . SKIN BIOPSY     left temple. cancerous  . TOTAL HIP ARTHROPLASTY  2003   left    Social History   Social History  . Marital status: Widowed    Spouse name: N/A  . Number of children: 79  . Years of education: N/A   Occupational History  .  retired Retired   Social History Main Topics  . Smoking status: Former Smoker    Packs/day: 0.30    Years: 5.00    Types: Cigarettes    Quit date: 10/28/1973  . Smokeless tobacco: Never Used  . Alcohol use Yes     Comment: socially  . Drug use: No  . Sexual activity: Not on file   Other Topics Concern  . Not on file   Social History Narrative  . No narrative on file    Family History  Problem Relation Age of Onset  . Stroke Mother   . Prostate cancer Father     prostate  . Hypertension Brother   . Cancer Maternal Grandmother   . Hypertension Brother   . Heart disease Brother   . Obesity Brother   . Hypertension Brother   . Hyperlipidemia Brother   . Heart disease Brother   . Kidney disease Brother   . Cancer  Daughter     kidney- spine and blood stream  . Heart disease Son     valvular heart disease  . Prostate cancer Brother     ROS: no fevers or chills, productive cough, hemoptysis, dysphasia, odynophagia, melena, hematochezia, dysuria, hematuria, rash, seizure activity, orthopnea, PND, pedal edema, claudication. Remaining systems are negative.  Physical Exam: Well-developed obese in no acute distress.  Skin is warm and dry.  HEENT is normal.  Neck is supple.  Chest is clear to auscultation with normal expansion.  Cardiovascular exam is irregular Abdominal exam nontender or distended. No masses palpated. Extremities show no edema. neuro grossly intact  ECG atrial fibrillation at a rate of 76, inferior infarct.  A/P  1 hypertension-blood pressure controlled. Continue present medications.  2 permanent atrial fibrillation-continue Cardizem for rate control. Continue pradaxa. Check hemoglobin and renal function.   Kirk Ruths, MD

## 2016-08-19 ENCOUNTER — Encounter: Payer: Self-pay | Admitting: Cardiology

## 2016-08-19 ENCOUNTER — Ambulatory Visit (INDEPENDENT_AMBULATORY_CARE_PROVIDER_SITE_OTHER): Payer: Medicare Other | Admitting: Cardiology

## 2016-08-19 VITALS — BP 122/82 | HR 76 | Ht 67.0 in | Wt 233.2 lb

## 2016-08-19 DIAGNOSIS — I4891 Unspecified atrial fibrillation: Secondary | ICD-10-CM

## 2016-08-19 DIAGNOSIS — I10 Essential (primary) hypertension: Secondary | ICD-10-CM | POA: Diagnosis not present

## 2016-08-19 NOTE — Patient Instructions (Signed)
Medication Instructions:   NO CHANGE  Labwork:  Your physician recommends that you return for lab work WHEN CONVENIENT  Follow-Up:  Your physician wants you to follow-up in: 6 MONTHS WITH DR CRENSHAW You will receive a reminder letter in the mail two months in advance. If you don't receive a letter, please call our office to schedule the follow-up appointment.   If you need a refill on your cardiac medications before your next appointment, please call your pharmacy.    

## 2016-08-26 ENCOUNTER — Encounter: Payer: Self-pay | Admitting: Family Medicine

## 2016-08-26 ENCOUNTER — Ambulatory Visit (INDEPENDENT_AMBULATORY_CARE_PROVIDER_SITE_OTHER): Payer: Medicare Other | Admitting: Family Medicine

## 2016-08-26 ENCOUNTER — Other Ambulatory Visit: Payer: Self-pay | Admitting: Cardiology

## 2016-08-26 ENCOUNTER — Ambulatory Visit: Payer: Medicare Other | Admitting: *Deleted

## 2016-08-26 DIAGNOSIS — R739 Hyperglycemia, unspecified: Secondary | ICD-10-CM

## 2016-08-26 DIAGNOSIS — G4733 Obstructive sleep apnea (adult) (pediatric): Secondary | ICD-10-CM

## 2016-08-26 DIAGNOSIS — E559 Vitamin D deficiency, unspecified: Secondary | ICD-10-CM | POA: Diagnosis not present

## 2016-08-26 DIAGNOSIS — I10 Essential (primary) hypertension: Secondary | ICD-10-CM | POA: Diagnosis not present

## 2016-08-26 DIAGNOSIS — I482 Chronic atrial fibrillation, unspecified: Secondary | ICD-10-CM

## 2016-08-26 NOTE — Assessment & Plan Note (Signed)
Well controlled, no changes to meds. Encouraged heart healthy diet such as the DASH diet and exercise as tolerated.  °

## 2016-08-26 NOTE — Assessment & Plan Note (Signed)
hgba1c acceptable, minimize simple carbs. Increase exercise as tolerated.  

## 2016-08-26 NOTE — Assessment & Plan Note (Signed)
Taking daily supplements. Check level today

## 2016-08-26 NOTE — Assessment & Plan Note (Signed)
Feeling well, doing well on Pradaxa, continue same

## 2016-08-26 NOTE — Progress Notes (Signed)
Pre visit review using our clinic review tool, if applicable. No additional management support is needed unless otherwise documented below in the visit note. 

## 2016-08-26 NOTE — Assessment & Plan Note (Signed)
Uses O2 nightly, did not tolerate CPAP so chooses not to use it so far, discussed at length. Tried to use it for nearly 3 years but did not tolerate. Encouraged to follow up with pulmonology

## 2016-08-26 NOTE — Patient Instructions (Signed)
Melatonin 2-5 mg at bedtime for insomnia  Sleep Apnea  Sleep apnea is a sleep disorder characterized by abnormal pauses in breathing while you sleep. When your breathing pauses, the level of oxygen in your blood decreases. This causes you to move out of deep sleep and into light sleep. As a result, your quality of sleep is poor, and the system that carries your blood throughout your body (cardiovascular system) experiences stress. If sleep apnea remains untreated, the following conditions can develop:  High blood pressure (hypertension).  Coronary artery disease.  Inability to achieve or maintain an erection (impotence).  Impairment of your thought process (cognitive dysfunction). There are three types of sleep apnea: 1. Obstructive sleep apnea--Pauses in breathing during sleep because of a blocked airway. 2. Central sleep apnea--Pauses in breathing during sleep because the area of the brain that controls your breathing does not send the correct signals to the muscles that control breathing. 3. Mixed sleep apnea--A combination of both obstructive and central sleep apnea. RISK FACTORS The following risk factors can increase your risk of developing sleep apnea:  Being overweight.  Smoking.  Having narrow passages in your nose and throat.  Being of older age.  Being female.  Alcohol use.  Sedative and tranquilizer use.  Ethnicity. Among individuals younger than 35 years, African Americans are at increased risk of sleep apnea. SYMPTOMS   Difficulty staying asleep.  Daytime sleepiness and fatigue.  Loss of energy.  Irritability.  Loud, heavy snoring.  Morning headaches.  Trouble concentrating.  Forgetfulness.  Decreased interest in sex.  Unexplained sleepiness. DIAGNOSIS  In order to diagnose sleep apnea, your caregiver will perform a physical examination. A sleep study done in the comfort of your own home may be appropriate if you are otherwise healthy. Your  caregiver may also recommend that you spend the night in a sleep lab. In the sleep lab, several monitors record information about your heart, lungs, and brain while you sleep. Your leg and arm movements and blood oxygen level are also recorded. TREATMENT The following actions may help to resolve mild sleep apnea:  Sleeping on your side.   Using a decongestant if you have nasal congestion.   Avoiding the use of depressants, including alcohol, sedatives, and narcotics.   Losing weight and modifying your diet if you are overweight. There also are devices and treatments to help open your airway:  Oral appliances. These are custom-made mouthpieces that shift your lower jaw forward and slightly open your bite. This opens your airway.  Devices that create positive airway pressure. This positive pressure "splints" your airway open to help you breathe better during sleep. The following devices create positive airway pressure:  Continuous positive airway pressure (CPAP) device. The CPAP device creates a continuous level of air pressure with an air pump. The air is delivered to your airway through a mask while you sleep. This continuous pressure keeps your airway open.  Nasal expiratory positive airway pressure (EPAP) device. The EPAP device creates positive air pressure as you exhale. The device consists of single-use valves, which are inserted into each nostril and held in place by adhesive. The valves create very little resistance when you inhale but create much more resistance when you exhale. That increased resistance creates the positive airway pressure. This positive pressure while you exhale keeps your airway open, making it easier to breath when you inhale again.  Bilevel positive airway pressure (BPAP) device. The BPAP device is used mainly in patients with central sleep apnea. This  device is similar to the CPAP device because it also uses an air pump to deliver continuous air pressure through  a mask. However, with the BPAP machine, the pressure is set at two different levels. The pressure when you exhale is lower than the pressure when you inhale.  Surgery. Typically, surgery is only done if you cannot comply with less invasive treatments or if the less invasive treatments do not improve your condition. Surgery involves removing excess tissue in your airway to create a wider passage way.   This information is not intended to replace advice given to you by your health care provider. Make sure you discuss any questions you have with your health care provider.   Document Released: 10/04/2002 Document Revised: 11/04/2014 Document Reviewed: 02/20/2012 Elsevier Interactive Patient Education [employer].

## 2016-08-26 NOTE — Assessment & Plan Note (Signed)
Encouraged heart healthy diet, increase exercise, avoid trans fats, consider a krill oil cap daily 

## 2016-08-26 NOTE — Progress Notes (Signed)
Patient ID: Gina Reilly, female   DOB: 30-Jul-1924, 82 y.o.   MRN: [id number]   Subjective:    Patient ID: Gina Reilly, female    DOB: 03-08-1924, 82 y.o.   MRN: [id number]  Chief Complaint  Patient presents with  . Follow-up    HPI   Patient is in today for routine follow up. She is feeling fairly well and has not recently been hospitalized but does endorse persistent SOB with any exertion. Difficulty with sleeping and fatigue. She has restless sleep, snoring and wakes up tired and with headaches at times. She has a diagnosis of sleep apnea but has chosen not to use CPAP in past but may consider it now. Denies CP/palp/HA/congestion/fevers/GI or GU c/o. Taking meds as prescribed  Past Medical History:  Diagnosis Date  . 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 Anemia   . Apnea, sleep 04/24/2012  . Arthritis    osteo  . Atrial fibrillation ([place])    Status post TEE guided DCCV 07/2010:  Transesophageal Echocardiogram 07/2010: EF 60-65% mild MR.;  Amiodarone;  Pradaxa  . Bronchitis 12/25/2011  . Chicken pox as a child  . Constipation 11/22/2014  . Contusion of leg, left 01/16/2012  . GERD (gastroesophageal reflux disease)   . Hyperglycemia 01/10/2010   Qualifier: Diagnosis of  By: Niel Hummer MD, [place] Hyperlipidemia   . Hypertension   . Hyperthyroidism 02/09/2009   Qualifier: Diagnosis of  By: Niel Hummer MD, [place] Hypoxia 04/14/2012  . Intermittent claudication ([place]) 05/22/2012  . Measles as a child  . Medicare annual wellness visit, subsequent 11/27/2014   Follows with pulmonology, Dr Gwenette Greet Follow with cardiology, Dr Rance Muir with Dr Matthew Saras Does not participate in [facility], pap or colonoscopy screening at this time.  . Mumps as a child  . Obese   . Ovarian cyst   . Pelvic mass in female   . Peripheral neuropathy ([place]) 02/26/2012  . Precancerous skin lesion    . Skin cancer    left temple  . Thyroid disease    hypothroidism  . Vitamin D deficiency     Past Surgical History:  Procedure Laterality Date  . ABDOMINAL HYSTERECTOMY    . CATARACT EXTRACTION     Dr. Satira Sark  . SKIN BIOPSY     left temple. cancerous  . TOTAL HIP ARTHROPLASTY  2003   left    Family History  Problem Relation Age of Onset  . Stroke Mother   . Prostate cancer Father     prostate  . Hypertension Brother   . Cancer Maternal Grandmother   . Hypertension Brother   . Heart disease Brother   . Obesity Brother   . Hypertension Brother   . Hyperlipidemia Brother   . Heart disease Brother   . Kidney disease Brother   . Cancer Daughter     kidney- spine and blood stream  . Heart disease Son     valvular heart disease  . Prostate cancer Brother     Social History   Social History  . Marital status: Widowed    Spouse name: N/A  . Number of children: 71  . Years of education: N/A   Occupational History  . retired Retired   Social History Main Topics  . Smoking status: Former Smoker    Packs/day: 0.30  Years: 5.00    Types: Cigarettes    Quit date: 10/28/1973  . Smokeless tobacco: Never Used  . Alcohol use Yes     Comment: socially  . Drug use: No  . Sexual activity: Not on file   Other Topics Concern  . Not on file   Social History Narrative  . No narrative on file    Outpatient Medications Prior to Visit  Medication Sig Dispense Refill  . acetaminophen (TYLENOL) 500 MG tablet Take 2 tablets (1,000 mg total) by mouth every 8 (eight) hours as needed for pain (max of 3000 mg in 24 hours). 30 tablet 0  . dabigatran (PRADAXA) 150 MG CAPS capsule Take 1 capsule (150 mg total) by mouth 2 (two) times daily. 180 capsule 3  . diltiazem (CARDIZEM CD) 360 MG 24 hr capsule TAKE 1 CAPSULE DAILY 90 capsule 2  . furosemide (LASIX) 20 MG tablet Take 1 tablet (20 mg total) by mouth as needed. 30 tablet 3  . losartan (COZAAR) 25 MG tablet TAKE 1 TABLET DAILY  IN THE EVENING 90 tablet 2  . Probiotic Product (PROBIOTIC DAILY PO) Take 1 tablet by mouth daily.    . ranitidine (ZANTAC) 150 MG tablet Take 1 tablet (150 mg total) by mouth daily as needed for heartburn. 30 tablet 1   No facility-administered medications prior to visit.     Allergies  Allergen Reactions  . Amiodarone Other (See Comments)    Affecting the Lungs  . Aspercreme [Trolamine Salicylate]     Review of Systems  Constitutional: Positive for malaise/fatigue. Negative for fever.  HENT: Negative for congestion.   Eyes: Negative for blurred vision.  Respiratory: Positive for shortness of breath.   Cardiovascular: Negative for chest pain, palpitations and leg swelling.  Gastrointestinal: Negative for abdominal pain, blood in stool and nausea.  Genitourinary: Negative for dysuria and frequency.  Musculoskeletal: Negative for falls.  Skin: Negative for rash.  Neurological: Negative for dizziness, loss of consciousness and headaches.  Endo/Heme/Allergies: Negative for environmental allergies.  Psychiatric/Behavioral: Negative for depression. The patient has insomnia. The patient is not nervous/anxious.        Objective:    Physical Exam  Constitutional: She is oriented to person, place, and time. She appears well-developed and well-nourished. No distress.  HENT:  [place]: Normocephalic and atraumatic.  Nose: Nose normal.  Eyes: Right eye exhibits no discharge. Left eye exhibits no discharge.  Neck: Normal range of motion. Neck supple.  Cardiovascular: Regular rhythm.   Murmur heard. Irregularly irregular  Pulmonary/Chest: Effort normal and breath sounds normal.  Abdominal: Soft. Bowel sounds are normal. There is no tenderness.  Musculoskeletal: She exhibits no edema.  Neurological: She is alert and oriented to person, place, and time.  Skin: Skin is warm and dry.  Psychiatric: She has a normal mood and affect.  Nursing note and vitals reviewed.   BP 120/80 (BP  Location: Left Arm, Patient Position: Sitting, Cuff Size: Large)   Pulse 86   Temp 98.2 F (36.8 C) (Oral)   Ht 5' 7.5" (1.715 m)   Wt 233 lb (105.7 kg)   LMP 12/25/2011   SpO2 95%   BMI 35.95 kg/m  Wt Readings from Last 3 Encounters:  08/26/16 233 lb (105.7 kg)  08/19/16 233 lb 3.2 oz (105.8 kg)  02/12/16 233 lb (105.7 kg)     Lab Results  Component Value Date   WBC 9.1 01/25/2016   HGB 14.2 01/25/2016   HCT 42.7 01/25/2016   PLT  256.0 01/25/2016   GLUCOSE 144 (H) 01/25/2016   CHOL 159 01/25/2016   TRIG 140.0 01/25/2016   HDL 52.10 01/25/2016   LDLDIRECT 128.4 02/09/2009   LDLCALC 79 01/25/2016   ALT 10 01/25/2016   AST 16 01/25/2016   NA 137 01/25/2016   K 4.7 01/25/2016   CL 104 01/25/2016   CREATININE 1.04 01/25/2016   BUN 24 (H) 01/25/2016   CO2 26 01/25/2016   TSH 2.88 01/25/2016   INR 1.32 08/17/2010   HGBA1C 6.3 01/25/2016    Lab Results  Component Value Date   TSH 2.88 01/25/2016   Lab Results  Component Value Date   WBC 9.1 01/25/2016   HGB 14.2 01/25/2016   HCT 42.7 01/25/2016   MCV 94.3 01/25/2016   PLT 256.0 01/25/2016   Lab Results  Component Value Date   NA 137 01/25/2016   K 4.7 01/25/2016   CO2 26 01/25/2016   GLUCOSE 144 (H) 01/25/2016   BUN 24 (H) 01/25/2016   CREATININE 1.04 01/25/2016   BILITOT 0.6 01/25/2016   ALKPHOS 65 01/25/2016   AST 16 01/25/2016   ALT 10 01/25/2016   PROT 7.0 01/25/2016   ALBUMIN 4.0 01/25/2016   CALCIUM 9.4 01/25/2016   GFR 52.90 (L) 01/25/2016   Lab Results  Component Value Date   CHOL 159 01/25/2016   Lab Results  Component Value Date   HDL 52.10 01/25/2016   Lab Results  Component Value Date   LDLCALC 79 01/25/2016   Lab Results  Component Value Date   TRIG 140.0 01/25/2016   Lab Results  Component Value Date   CHOLHDL 3 01/25/2016   Lab Results  Component Value Date   HGBA1C 6.3 01/25/2016       Assessment & Plan:   Problem List Items Addressed This Visit     Hyperglycemia    hgba1c acceptable, minimize simple carbs. Increase exercise as tolerated      Relevant Orders   Hemoglobin A1c   Vitamin D deficiency    Taking daily supplements. Check level today      Relevant Orders   Vitamin D (25 hydroxy)   ATRIAL FIBRILLATION    Feeling well, doing well on Pradaxa, continue same      Hypertension    Well controlled, no changes to meds. Encouraged heart healthy diet such as the DASH diet and exercise as tolerated.       Relevant Orders   CBC   TSH   Comprehensive metabolic panel   OSA (obstructive sleep apnea)    Uses O2 nightly, did not tolerate CPAP so chooses not to use it so far, discussed at length. Tried to use it for nearly 3 years but did not tolerate. Encouraged to follow up with pulmonology      Relevant Orders   Ambulatory referral to Pulmonology    Other Visit Diagnoses   None.     I am having Ms. Forehand maintain her acetaminophen, Probiotic Product (PROBIOTIC DAILY PO), furosemide, ranitidine, diltiazem, losartan, and dabigatran.  No orders of the defined types were placed in this encounter.    Gina Homans, MD

## 2016-08-27 LAB — COMPREHENSIVE METABOLIC PANEL
ALK PHOS: 59 U/L (ref 39–117)
ALT: 10 U/L (ref 0–35)
AST: 16 U/L (ref 0–37)
Albumin: 3.9 g/dL (ref 3.5–5.2)
BUN: 21 mg/dL (ref 6–23)
CALCIUM: 9.2 mg/dL (ref 8.4–10.5)
CO2: 26 meq/L (ref 19–32)
Chloride: 104 mEq/L (ref 96–112)
Creatinine, Ser: 0.95 mg/dL (ref 0.40–1.20)
GFR: 58.65 mL/min — AB (ref >60.00)
GLUCOSE: 174 mg/dL — AB (ref 70–99)
POTASSIUM: 4 meq/L (ref 3.5–5.1)
Sodium: 138 mEq/L (ref 135–145)
TOTAL PROTEIN: 6.9 g/dL (ref 6.0–8.3)
Total Bilirubin: 0.5 mg/dL (ref 0.2–1.2)

## 2016-08-27 LAB — CBC
HEMATOCRIT: 44 % (ref 36.0–46.0)
HEMOGLOBIN: 15 g/dL (ref 12.0–15.0)
MCHC: 34 g/dL (ref 30.0–36.0)
MCV: 95.1 fl (ref 78.0–100.0)
Platelets: 216 10*3/uL (ref 150.0–400.0)
RBC: 4.63 Mil/uL (ref 3.87–5.11)
RDW: 13.6 % (ref 11.5–15.5)
WBC: 8.2 10*3/uL (ref 4.0–10.5)

## 2016-08-27 LAB — TSH: TSH: 2.06 u[IU]/mL (ref 0.35–4.50)

## 2016-08-27 LAB — VITAMIN D 25 HYDROXY (VIT D DEFICIENCY, FRACTURES): VITD: 17.57 ng/mL — AB (ref 30.00–100.00)

## 2016-08-27 LAB — HEMOGLOBIN A1C: Hgb A1c MFr Bld: 6.1 % (ref 4.6–6.5)

## 2016-08-29 ENCOUNTER — Other Ambulatory Visit: Payer: Self-pay | Admitting: Family Medicine

## 2016-08-29 MED ORDER — VITAMIN D (ERGOCALCIFEROL) 1.25 MG (50000 UNIT) PO CAPS: 50000.0000 [IU] | ORAL_CAPSULE | ORAL | 4 refills | Status: DC

## 2016-08-30 ENCOUNTER — Other Ambulatory Visit: Payer: Self-pay | Admitting: Family Medicine

## 2016-09-04 NOTE — Progress Notes (Addendum)
Subjective:   Gina Reilly is a 82 y.o. female who presents for Medicare Annual (Subsequent) preventive examination.  Review of Systems:  No ROS.  Medicare Wellness Visit.  Cardiac Risk Factors include: advanced age (>81men, >64 women);obesity (BMI >30kg/m2);sedentary lifestyle;hypertension;dyslipidemia Sleep patterns: Pt states she does not sleep well. Pt has appt for 10/02/16 for sleep consultation with [facility]. Pt also reminded of the Melatonin instructions per Dr.Blyth at [facility] on 08/26/16. Home Safety/Smoke Alarms:  Pt lives alone. Feel safe. Smoke detectors present in home.  Living environment; residence and Firearm Safety: No firearms. Seat Belt Safety/Bike Helmet: Wears seatbelt.   Counseling:   Eye Exam- Follows with Dr.Hollander annually.   Female:   Pap- hysterectomy    Mammo-  n/a due to age         Dexa scan- Not done. Dr.Blyth aware. Pt on Vit D  50000 units weekly. CCS- n/a due to age           Objective:     Vitals: BP 133/85 (BP Location: Left Arm, Patient Position: Sitting, Cuff Size: Normal)   Pulse 88   Ht 5\' 6"  (1.676 m)   Wt 239 lb 9.6 oz (108.7 kg)   LMP 12/25/2011   SpO2 98%   BMI 38.67 kg/m   Body mass index is 38.67 kg/m.   Tobacco History  Smoking Status  . Former Smoker  . Packs/day: 0.30  . Years: 5.00  . Types: Cigarettes  . Quit date: 10/28/1973  Smokeless Tobacco  . Never Used     Counseling given: Not Answered   Past Medical History:  Diagnosis Date  . 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 Anemia   . Apnea, sleep 04/24/2012  . Arthritis    osteo  . Atrial fibrillation ([place])    Status post TEE guided DCCV 07/2010:  Transesophageal Echocardiogram 07/2010: EF 60-65% mild MR.;  Amiodarone;  Pradaxa  . Bronchitis 12/25/2011  . Chicken pox as a child  . Constipation 11/22/2014  . Contusion of leg, left  01/16/2012  . GERD (gastroesophageal reflux disease)   . Hyperglycemia 01/10/2010   Qualifier: Diagnosis of  By: Niel Hummer MD, [place] Hyperlipidemia   . Hypertension   . Hyperthyroidism 02/09/2009   Qualifier: Diagnosis of  By: Niel Hummer MD, [place] Hypoxia 04/14/2012  . Intermittent claudication ([place]) 05/22/2012  . Measles as a child  . Medicare annual wellness visit, subsequent 11/27/2014   Follows with pulmonology, Dr Gwenette Greet Follow with cardiology, Dr Rance Muir with Dr Matthew Saras Does not participate in [facility], pap or colonoscopy screening at this time.  . Mumps as a child  . Obese   . Ovarian cyst   . Pelvic mass in female   . Peripheral neuropathy ([place]) 02/26/2012  . Precancerous skin lesion   . Skin cancer    left temple  . Thyroid disease    hypothroidism  . Vitamin D deficiency    Past Surgical History:  Procedure Laterality Date  . ABDOMINAL HYSTERECTOMY    . CATARACT EXTRACTION     Dr. Satira Sark  . SKIN BIOPSY     left temple. cancerous  . TOTAL HIP ARTHROPLASTY  2003   left   Family History  Problem Relation Age of Onset  . Stroke Mother   . Prostate cancer Father  prostate  . Hypertension Brother   . Cancer Maternal Grandmother   . Hypertension Brother   . Heart disease Brother   . Obesity Brother   . Hypertension Brother   . Hyperlipidemia Brother   . Heart disease Brother   . Kidney disease Brother   . Cancer Daughter     kidney- spine and blood stream  . Heart disease Son     valvular heart disease  . Prostate cancer Brother    History  Sexual Activity  . Sexual activity: Not on file    Outpatient Encounter Prescriptions as of 09/05/2016  Medication Sig  . acetaminophen (TYLENOL) 500 MG tablet Take 2 tablets (1,000 mg total) by mouth every 8 (eight) hours as needed for pain (max of 3000 mg in 24 hours).  . dabigatran (PRADAXA) 150 MG CAPS capsule Take 1 capsule (150 mg total) by mouth 2 (two) times daily.  Marland Kitchen diltiazem (CARDIZEM CD)  360 MG 24 hr capsule TAKE 1 CAPSULE DAILY  . furosemide (LASIX) 20 MG tablet Take 1 tablet (20 mg total) by mouth as needed.  Marland Kitchen losartan (COZAAR) 25 MG tablet TAKE 1 TABLET DAILY IN THE EVENING  . Probiotic Product (PROBIOTIC DAILY PO) Take 1 tablet by mouth daily.  . ranitidine (ZANTAC) 150 MG tablet Take 1 tablet (150 mg total) by mouth daily as needed for heartburn.  . Vitamin D, Ergocalciferol, (DRISDOL) 50000 units CAPS capsule Take 1 capsule (50,000 Units total) by mouth every 7 (seven) days.   No facility-administered encounter medications on file as of 09/05/2016.     Activities of Daily Living In your present state of health, do you have any difficulty performing the following activities: 09/05/2016 01/25/2016  Hearing? Tempie Donning  Vision? N Y  Difficulty concentrating or making decisions? N N  Walking or climbing stairs? N Y  Dressing or bathing? N N  Doing errands, shopping? N N  Preparing Food and eating ? N -  Using the Toilet? N -  In the past six months, have you accidently leaked urine? Y -  Do you have problems with loss of bowel control? N -  Managing your Medications? N -  Managing your Finances? N -  Housekeeping or managing your Housekeeping? Y -  Some recent data might be hidden    Patient Care Team: Mosie Lukes, MD as PCP - General (Family Medicine) Ralene Bathe, MD as Consulting Physician (Ophthalmology) Lelon Perla, MD as Consulting Physician (Cardiology)    Assessment:    Physical assessment deferred to PCP.  Exercise Activities and Dietary recommendations Current Exercise Habits: The patient does not participate in regular exercise at present, Exercise limited by: orthopedic condition(s)  Diet (meal preparation, eat out, water intake, caffeinated beverages, dairy products, fruits and vegetables): in general, a "healthy" diet  , well balanced, on average, 3 meals per day, low salt. Pt states she eats plenty of fruit, root vegtables, oatmeal, protein,  and tries to avoid salt. Pt also states she needs to improve on her water intake.     Goals    . Increase physical activity          Begin chair exercises and increase as tolerated.     . Increase water intake      Fall Risk Fall Risk  09/05/2016 01/25/2016 11/22/2014 11/22/2014  Falls in the past year? No No No No   Depression Screen PHQ 2/9 Scores 09/05/2016 01/25/2016 11/22/2014 11/22/2014  PHQ - 2 Score 0 0 0  0     Cognitive Function MMSE - Mini Mental State Exam 09/05/2016  Orientation to time 5  Orientation to Place 5  Registration 3  Attention/ Calculation 5  Recall 3  Language- name 2 objects 2  Language- repeat 1  Language- follow 3 step command 3  Language- read & follow direction 1  Write a sentence 1  Copy design 1  Total score 30        Immunization History  Administered Date(s) Administered  . Pneumococcal Polysaccharide-23 01/25/2016   Screening Tests Health Maintenance  Topic Date Due  . INFLUENZA VACCINE  01/24/2017 (Originally 05/28/2016)  . TETANUS/TDAP  03/18/2017 (Originally 12/23/1942)  . DEXA SCAN  03/02/2018 (Originally 12/23/1990)  . ZOSTAVAX  11/27/2018 (Originally 12/23/1985)  . PNA vac Low Risk Adult (2 of 2 - PCV13) 01/24/2017      Plan:   Per Dr. Charlett Blake: Melatonin 2-5 mg at bedtime for insomnia. Begin doing chair exercises and increase as tolerated. Continue to eat heart healthy diet (full of fruits, vegetables, whole grains, lean protein, water--limit salt, fat, and sugar intake) and increase physical activity as tolerated. Pt declines flu vaccine and Zostavax.    During the course of the visit the patient was educated and counseled about the following appropriate screening and preventive services:   Vaccines to include Pneumoccal, Influenza, Hepatitis B, Td, Zostavax, HCV  Cardiovascular Disease  Colorectal cancer screening  Bone density screening  Diabetes screening  Glaucoma screening  Mammography/PAP  Nutrition  counseling   Patient Instructions (the written plan) was given to the patient.   Shela Nevin, [place]  09/05/2016   RN AWV note reviewed. Agree with documention and plan.

## 2016-09-04 NOTE — Progress Notes (Signed)
Pre visit review using our clinic review tool, if applicable. No additional management support is needed unless otherwise documented below in the visit note. 

## 2016-09-05 ENCOUNTER — Encounter: Payer: Self-pay | Admitting: *Deleted

## 2016-09-05 ENCOUNTER — Ambulatory Visit (INDEPENDENT_AMBULATORY_CARE_PROVIDER_SITE_OTHER): Payer: Medicare Other | Admitting: *Deleted

## 2016-09-05 VITALS — BP 133/85 | HR 88 | Ht 66.0 in | Wt 239.6 lb

## 2016-09-05 DIAGNOSIS — E559 Vitamin D deficiency, unspecified: Secondary | ICD-10-CM | POA: Diagnosis not present

## 2016-09-05 DIAGNOSIS — K5900 Constipation, unspecified: Secondary | ICD-10-CM

## 2016-09-05 DIAGNOSIS — Z0000 Encounter for general adult medical examination without abnormal findings: Secondary | ICD-10-CM

## 2016-09-05 NOTE — Assessment & Plan Note (Signed)
Pt c/o ongoing constipation issues. States she takes Metamucil cookies daily. Advised pt to increase water, fiber, and physical activity.

## 2016-09-05 NOTE — Patient Instructions (Addendum)
Per Dr. Charlett Blake: Melatonin 2-5 mg at bedtime for insomnia. Begin doing chair exercises and increase as tolerated. Continue to eat heart healthy diet (full of fruits, vegetables, whole grains, lean protein, water--limit salt, fat, and sugar intake) and increase physical activity as tolerated.

## 2016-09-05 NOTE — Assessment & Plan Note (Signed)
Pt Vit D level low at OV last week. Pt confirms she is taking prescribed Vit D as directed.

## 2016-10-02 ENCOUNTER — Encounter: Payer: Self-pay | Admitting: Pulmonary Disease

## 2016-10-02 ENCOUNTER — Ambulatory Visit (INDEPENDENT_AMBULATORY_CARE_PROVIDER_SITE_OTHER): Payer: Medicare Other | Admitting: Pulmonary Disease

## 2016-10-02 DIAGNOSIS — G4733 Obstructive sleep apnea (adult) (pediatric): Secondary | ICD-10-CM

## 2016-10-02 NOTE — Assessment & Plan Note (Signed)
Patient with moderate sleep apnea. Currently on CPAP, unsure settings. Had issues using CPAP so she did not use it for a couple months. Hypersomnia and insomnia and her sleep worsen. Recently restarted CPAP and she feels better. More energy. Less sleepiness. She is not on oxygen. She needs supplies.  Plan : We extensively discussed the importance of treating OSA and the need to use PAP therapy.   Continue with cpap. We need to determine her CPAP settings. We will schedule her for a mask fit. She will let us know who her DME company is so we can order supplies and download. Patient to call back with her DME company.   Patient was instructed to have mask, tubings, filter, reservoir cleaned at least once a week with soapy water.  Patient was instructed to call the office if he/she is having issues with the PAP device.    I advised patient to obtain sufficient amount of sleep --  7 to 8 hours at least in a 24 hr period.  Patient was advised to follow good sleep hygiene.  Patient was advised NOT to engage in activities requiring concentration and/or vigilance if he/she is and  sleepy.  Patient is NOT to drive if he/she is sleepy.

## 2016-10-02 NOTE — Patient Instructions (Signed)
  It was a pleasure taking care of you today!  Continue using your CPAP machine. We will try a mask fitting session to determine best mask fit. Please call us back with your supplier for oxygen so we know to call regarding CPAP supplies.  Please make sure you use your CPAP device everytime you sleep.  We will monitor the usage of your machine per your insurance requirement.  Your insurance company may take the machine from you if you are not using it regularly.   Please clean the mask, tubings, filter, water reservoir with soapy water every week.  Please use distilled water for the water reservoir.   Please call the office or your machine provider (DME company) if you are having issues with the device.   Return to clinic in 3 months   with Dr. Corrie Dandy

## 2016-10-02 NOTE — Progress Notes (Signed)
Subjective:    Patient ID: Gina Reilly, female    DOB: 05-Jul-1924, 82 y.o.   MRN: [id number]  HPI Patient returns to the office as follow-up on her moderate-severe sleep apnea. Last time she was seen by Dr. Gwenette Greet in 07/2014.   ROV 10/02/2016 patient returns to the office as follow-up on her sleep apnea. Since last seen, she had issues with her CPAP so she stopped using it. Her sleep in hypersomnia worsen. She recently restarted using her CPAP. Feels better using it. More energy. However, she has not received supplies. She is currently not using oxygen.  Review of Systems  Constitutional: Negative.  Negative for fever and unexpected weight change.  HENT: Negative.  Negative for congestion, dental problem, ear pain, nosebleeds, postnasal drip, rhinorrhea, sinus pressure, sneezing, sore throat and trouble swallowing.   Eyes: Negative.  Negative for redness and itching.  Respiratory: Negative.  Negative for cough, chest tightness, shortness of breath and wheezing.   Cardiovascular: Positive for leg swelling. Negative for palpitations.  Gastrointestinal: Negative.  Negative for nausea and vomiting.  Endocrine: Negative.   Genitourinary: Negative.  Negative for dysuria.  Musculoskeletal: Negative.  Negative for joint swelling.  Skin: Negative.  Negative for rash.  Allergic/Immunologic: Negative.   Neurological: Negative.  Negative for headaches.  Hematological: Bruises/bleeds easily.  Psychiatric/Behavioral: Negative.  Negative for dysphoric mood. The patient is not nervous/anxious.        Objective:   Physical Exam  Vitals:  Vitals:   10/02/16 1531  BP: 114/72  Pulse: 69  SpO2: 97%  Weight: 235 lb 6.4 oz (106.8 kg)  Height: 5\' 6"  (1.676 m)    Constitutional/General:  Pleasant, well-nourished, well-developed, not in any distress,  Comfortably seating.  Well kempt  Body mass index is 37.99 kg/m. Wt Readings from Last 3 Encounters:  10/02/16 235 lb 6.4 oz (106.8 kg)    09/05/16 239 lb 9.6 oz (108.7 kg)  08/26/16 233 lb (105.7 kg)    Neck circumference:   HEENT: Pupils equal and reactive to light and accommodation. Anicteric sclerae. Normal nasal mucosa.   No oral  lesions,  mouth clear,  oropharynx clear, no postnasal drip. (-) Oral thrush. No dental caries.  Airway - Mallampati class III  Neck: No masses. Midline trachea. No JVD, (-) LAD. (-) bruits appreciated.  Respiratory/Chest: Grossly normal chest. (-) deformity. (-) Accessory muscle use.  Symmetric expansion. (-) Tenderness on palpation.  Resonant on percussion.  Diminished BS on both lower lung zones. (-) wheezing, crackles, rhonchi (-) egophony  Cardiovascular: Regular rate and  rhythm, heart sounds normal, no murmur or gallops, no peripheral edema  Gastrointestinal:  Normal bowel sounds. Soft, non-tender. No hepatosplenomegaly.  (-) masses.   Musculoskeletal:  Normal muscle tone. Normal gait.   Extremities: Grossly normal. (-) clubbing, cyanosis.  (-) edema  Skin: (-) rash,lesions seen.   Neurological/Psychiatric : alert, oriented to time, place, person. Normal mood and affect          Assessment & Plan:  OSA (obstructive sleep apnea) Patient with moderate sleep apnea. Currently on CPAP, unsure settings. Had issues using CPAP so she did not use it for a couple months. Hypersomnia and insomnia and her sleep worsen. Recently restarted CPAP and she feels better. More energy. Less sleepiness. She is not on oxygen. She needs supplies.  Plan : We extensively discussed the importance of treating OSA and the need to use PAP therapy.   Continue with cpap. We need to determine her CPAP settings.  We will schedule her for a mask fit. She will let us know who her DME company is so we can order supplies and download. Patient to call back with her DME company.   Patient was instructed to have mask, tubings, filter, reservoir cleaned at least once a week with soapy water.  Patient  was instructed to call the office if he/she is having issues with the PAP device.    I advised patient to obtain sufficient amount of sleep --  7 to 8 hours at least in a 24 hr period.  Patient was advised to follow good sleep hygiene.  Patient was advised NOT to engage in activities requiring concentration and/or vigilance if he/she is and  sleepy.  Patient is NOT to drive if he/she is sleepy.    Pt states she gets winded when she walks more than 200 feet. I signed off on her application for disability parking.  Return to clinic in 3 mos.     Monica Becton, MD 10/02/2016, 4:01 PM [facility] Pager [phone] After 3 pm or if no answer, call [phone]

## 2016-10-03 ENCOUNTER — Telehealth: Payer: Self-pay | Admitting: Pulmonary Disease

## 2016-10-03 NOTE — Telephone Encounter (Signed)
This information has been added to her chart. Nothing further was needed.

## 2016-10-14 ENCOUNTER — Other Ambulatory Visit: Payer: Self-pay | Admitting: Family Medicine

## 2016-10-14 ENCOUNTER — Telehealth: Payer: Self-pay | Admitting: Family Medicine

## 2016-10-14 ENCOUNTER — Other Ambulatory Visit (INDEPENDENT_AMBULATORY_CARE_PROVIDER_SITE_OTHER): Payer: Medicare Other

## 2016-10-14 DIAGNOSIS — R300 Dysuria: Secondary | ICD-10-CM | POA: Diagnosis not present

## 2016-10-14 DIAGNOSIS — R350 Frequency of micturition: Secondary | ICD-10-CM | POA: Diagnosis not present

## 2016-10-14 LAB — URINALYSIS
Bilirubin Urine: NEGATIVE
HGB URINE DIPSTICK: NEGATIVE
Ketones, ur: NEGATIVE
LEUKOCYTES UA: NEGATIVE
NITRITE: NEGATIVE
SPECIFIC GRAVITY, URINE: 1.01 (ref 1.000–1.030)
Total Protein, Urine: NEGATIVE
URINE GLUCOSE: NEGATIVE
UROBILINOGEN UA: 0.2 (ref 0.0–1.0)
pH: 6 (ref 5.0–8.0)

## 2016-10-14 NOTE — Telephone Encounter (Signed)
Called the patient and she is having dysuria/frequency. Put order in for her to have urine checked at the [facility]. Pharmacy is [facility] on [address]

## 2016-10-14 NOTE — Telephone Encounter (Signed)
Please get her symptoms and then have her come in for a urinalysis with [facility] for UTI

## 2016-10-14 NOTE — Telephone Encounter (Signed)
Caller name: Relationship to patient: Self Can be reached: [phone]  Pharmacy:  Reason for call: Patient thinks she has a bladder infection and wants to bring in a urine sample to be tested. Plse adv

## 2016-10-16 LAB — URINE CULTURE

## 2016-11-12 ENCOUNTER — Telehealth: Payer: Self-pay | Admitting: Pulmonary Disease

## 2016-11-12 DIAGNOSIS — G4733 Obstructive sleep apnea (adult) (pediatric): Secondary | ICD-10-CM

## 2016-11-12 NOTE — Telephone Encounter (Signed)
Per AD's last OV note---we were waiting for the pt to call back with her DME name and the pt did this the day after the appt.    Order has been placed for [facility] to set the pt up for mask fitting session,  A download to help plan the best setting for her cpap and will also need supplies.  Pt is aware of order that has been sent to [employer].

## 2017-01-02 ENCOUNTER — Encounter: Payer: Self-pay | Admitting: Pulmonary Disease

## 2017-01-02 ENCOUNTER — Ambulatory Visit (INDEPENDENT_AMBULATORY_CARE_PROVIDER_SITE_OTHER): Payer: Medicare Other | Admitting: Pulmonary Disease

## 2017-01-02 ENCOUNTER — Telehealth: Payer: Self-pay | Admitting: Pulmonary Disease

## 2017-01-02 DIAGNOSIS — G4733 Obstructive sleep apnea (adult) (pediatric): Secondary | ICD-10-CM | POA: Diagnosis not present

## 2017-01-02 NOTE — Assessment & Plan Note (Signed)
Patient with moderate sleep apnea. Currently on CPAP, unsure settings. Had issues using CPAP so she did not use it for a couple months. Hypersomnia and insomnia and her sleep worsen. Recently restarted CPAP and she feels better. More energy. Less sleepiness. She is not on oxygen. She needs supplies.  Since last seen, she got a new CPAP mask and she is better with her CPAP usage. More energy. Less sleepiness. Feels benefit of CPAP. She had issues getting supplies from her current DME and she had to call number in [place] to get her supplies. No download has been done despite [place] requesting it.  Plan : We extensively discussed the importance of treating OSA and the need to use PAP therapy.   Continue with cpap. Unsure settings. We requested a download but allegedly could not be done. She feels better using CPAP machine. More energy, less sleepiness. Feels benefit of CPAP. She wants a new DME. We will call her current DME and sort things out. She will need a download. We will need to know her settings. We'll she will need supplies as well.    Patient was instructed to have mask, tubings, filter, reservoir cleaned at least once a week with soapy water.  Patient was instructed to call the office if he/she is having issues with the PAP device.    I advised patient to obtain sufficient amount of sleep --  7 to 8 hours at least in a 24 hr period.  Patient was advised to follow good sleep hygiene.  Patient was advised NOT to engage in activities requiring concentration and/or vigilance if he/she is and  sleepy.  Patient is NOT to drive if he/she is sleepy.

## 2017-01-02 NOTE — Progress Notes (Signed)
Subjective:    Patient ID: Gina Reilly, female    DOB: 04/27/1924, 83 y.o.   MRN: [id number]  HPI Patient returns to the office as follow-up on her moderate-severe sleep apnea. Last time she was seen by Dr. Gwenette Greet in 07/2014.   ROV 10/02/2016 patient returns to the office as follow-up on her sleep apnea. Since last seen, she had issues with her CPAP so she stopped using it. Her sleep in hypersomnia worsen. She recently restarted using her CPAP. Feels better using it. More energy. However, she has not received supplies. She is currently not using oxygen.  ROV 01/02/17 Patient returns to the office as follow-up on her sleep apnea. Since last seen, she was able to get a new CPAP mask but had to fight for it to get it. It took awhile before pt was able to get her mask and she had to call a supplier in [facility] given by her DME [facility] in [facility]. She feels better using CPAP. More energy. Less sleepiness. Feels benefit of CPAP.  Review of Systems  Constitutional: Negative.  Negative for fever and unexpected weight change.  HENT: Negative.  Negative for congestion, dental problem, ear pain, nosebleeds, postnasal drip, rhinorrhea, sinus pressure, sneezing, sore throat and trouble swallowing.   Eyes: Negative.  Negative for redness and itching.  Respiratory: Negative.  Negative for cough, chest tightness, shortness of breath and wheezing.   Cardiovascular: Positive for leg swelling. Negative for palpitations.  Gastrointestinal: Negative.  Negative for nausea and vomiting.  Endocrine: Negative.   Genitourinary: Negative.  Negative for dysuria.  Musculoskeletal: Negative.  Negative for joint swelling.  Skin: Negative.  Negative for rash.  Allergic/Immunologic: Negative.   Neurological: Negative.  Negative for headaches.  Hematological: Bruises/bleeds easily.  Psychiatric/Behavioral: Negative.  Negative for dysphoric mood. The patient is not nervous/anxious.        Objective:   Physical Exam  Vitals:    Vitals:   01/02/17 1343  BP: 124/80  Pulse: (!) 101  SpO2: 95%  Weight: 242 lb 12.8 oz (110.1 kg)  Height: 5\' 6"  (1.676 m)    Constitutional/General:  Pleasant, well-nourished, well-developed, not in any distress,  Comfortably seating.  Well kempt  Body mass index is 39.19 kg/m. Wt Readings from Last 3 Encounters:  01/02/17 242 lb 12.8 oz (110.1 kg)  10/02/16 235 lb 6.4 oz (106.8 kg)  09/05/16 239 lb 9.6 oz (108.7 kg)     HEENT: Pupils equal and reactive to light and accommodation. Anicteric sclerae. Normal nasal mucosa.   No oral  lesions,  mouth clear,  oropharynx clear, no postnasal drip. (-) Oral thrush. No dental caries.  Airway - Mallampati class III  Neck: No masses. Midline trachea. No JVD, (-) LAD. (-) bruits appreciated.  Respiratory/Chest: Grossly normal chest. (-) deformity. (-) Accessory muscle use.  Symmetric expansion. (-) Tenderness on palpation.  Resonant on percussion.  Diminished BS on both lower lung zones. (-) wheezing, crackles, rhonchi (-) egophony  Cardiovascular: Regular rate and  rhythm, heart sounds normal, no murmur or gallops, no peripheral edema  Gastrointestinal:  Normal bowel sounds. Soft, non-tender. No hepatosplenomegaly.  (-) masses.   Musculoskeletal:  Normal muscle tone. Normal gait.   Extremities: Grossly normal. (-) clubbing, cyanosis.  (-) edema  Skin: (-) rash,lesions seen.   Neurological/Psychiatric : alert, oriented to time, place, person. Normal mood and affect          Assessment & Plan:  OSA (obstructive sleep apnea) Patient with moderate sleep  apnea. Currently on CPAP, unsure settings. Had issues using CPAP so she did not use it for a couple months. Hypersomnia and insomnia and her sleep worsen. Recently restarted CPAP and she feels better. More energy. Less sleepiness. She is not on oxygen. She needs supplies.  Since last seen, she got a new CPAP mask and she is better with her CPAP usage. More energy.  Less sleepiness. Feels benefit of CPAP. She had issues getting supplies from her current DME and she had to call number in [place] to get her supplies. No download has been done despite [place] requesting it.  Plan : We extensively discussed the importance of treating OSA and the need to use PAP therapy.   Continue with cpap. Unsure settings. We requested a download but allegedly could not be done. She feels better using CPAP machine. More energy, less sleepiness. Feels benefit of CPAP. She wants a new DME. We will call her current DME and sort things out. She will need a download. We will need to know her settings. We'll she will need supplies as well.    Patient was instructed to have mask, tubings, filter, reservoir cleaned at least once a week with soapy water.  Patient was instructed to call the office if he/she is having issues with the PAP device.    I advised patient to obtain sufficient amount of sleep --  7 to 8 hours at least in a 24 hr period.  Patient was advised to follow good sleep hygiene.  Patient was advised NOT to engage in activities requiring concentration and/or vigilance if he/she is and  sleepy.  Patient is NOT to drive if he/she is sleepy.                                                                                                                                                                                                                                                          Return to clinic in 12 mos.     Monica Becton, MD 01/02/2017, 2:07 PM [facility] Pager [phone] After 3 pm or if no answer, call [phone]

## 2017-01-02 NOTE — Patient Instructions (Signed)
  It was a pleasure taking care of you today!  Continue using your CPAP machine.   Please make sure you use your CPAP device everytime you sleep.  We will monitor the usage of your machine per your insurance requirement.  Your insurance company may take the machine from you if you are not using it regularly.   Please clean the mask, tubings, filter, water reservoir with soapy water every week.  Please use distilled water for the water reservoir.   Please call the office or your machine provider (DME company) if you are having issues with the device.   Return to clinic in 1 year  with NP.    

## 2017-01-03 NOTE — Telephone Encounter (Signed)
Looks like an error encounter will close

## 2017-01-06 ENCOUNTER — Ambulatory Visit: Payer: Medicare Other | Admitting: Pulmonary Disease

## 2017-01-20 ENCOUNTER — Other Ambulatory Visit: Payer: Self-pay | Admitting: Cardiology

## 2017-02-04 ENCOUNTER — Ambulatory Visit: Payer: Medicare Other | Admitting: Cardiology

## 2017-02-05 ENCOUNTER — Ambulatory Visit (INDEPENDENT_AMBULATORY_CARE_PROVIDER_SITE_OTHER): Payer: Medicare Other | Admitting: Internal Medicine

## 2017-02-05 ENCOUNTER — Ambulatory Visit (HOSPITAL_BASED_OUTPATIENT_CLINIC_OR_DEPARTMENT_OTHER)
Admission: RE | Admit: 2017-02-05 | Discharge: 2017-02-05 | Disposition: A | Discharge status: Home / Self Care | Payer: Medicare Other | Source: Ambulatory Visit | Attending: Internal Medicine | Admitting: Internal Medicine

## 2017-02-05 VITALS — BP 132/84 | HR 76 | Temp 97.7°F | Resp 14 | Ht 66.0 in | Wt 242.0 lb

## 2017-02-05 DIAGNOSIS — I7789 Other specified disorders of arteries and arterioles: Secondary | ICD-10-CM | POA: Insufficient documentation

## 2017-02-05 DIAGNOSIS — M25472 Effusion, left ankle: Secondary | ICD-10-CM | POA: Diagnosis not present

## 2017-02-05 DIAGNOSIS — M25572 Pain in left ankle and joints of left foot: Secondary | ICD-10-CM | POA: Diagnosis not present

## 2017-02-05 DIAGNOSIS — M79672 Pain in left foot: Secondary | ICD-10-CM

## 2017-02-05 DIAGNOSIS — M7989 Other specified soft tissue disorders: Secondary | ICD-10-CM | POA: Diagnosis not present

## 2017-02-05 DIAGNOSIS — S99922A Unspecified injury of left foot, initial encounter: Secondary | ICD-10-CM | POA: Diagnosis not present

## 2017-02-05 NOTE — Progress Notes (Signed)
Subjective:    Patient ID: Gina Reilly, female    DOB: 07/15/1924, 83 y.o.   MRN: [id number]  DOS:  02/05/2017 Type of visit - description : acute, here w/  Her neighbor Interval history: Wilburn Mylar, was trying to do some work in the backyard, she was on uneven ground, lost her balance and fell. She injured her lip with a faucett and also twisted her left ankle. She briefly bleed from the lip. Denies LOC, no major neck or back pain or injury. She is here because she is unable to walk. She can stand on her feet w/o pain but once she tries to walk it does hurt mostly at the dorsum of the foot.   Review of Systems (-)  headaches, nausea, dizziness (-) CP-SOB  Past Medical History:  Diagnosis Date  . 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 Anemia   . Apnea, sleep 04/24/2012  . Arthritis    osteo  . Atrial fibrillation ([place])    Status post TEE guided DCCV 07/2010:  Transesophageal Echocardiogram 07/2010: EF 60-65% mild MR.;  Amiodarone;  Pradaxa  . Bronchitis 12/25/2011  . Chicken pox as a child  . Constipation 11/22/2014  . Contusion of leg, left 01/16/2012  . GERD (gastroesophageal reflux disease)   . Hyperglycemia 01/10/2010   Qualifier: Diagnosis of  By: Niel Hummer MD, [place] Hyperlipidemia   . Hypertension   . Hyperthyroidism 02/09/2009   Qualifier: Diagnosis of  By: Niel Hummer MD, [place] Hypoxia 04/14/2012  . Intermittent claudication ([place]) 05/22/2012  . Measles as a child  . Medicare annual wellness visit, subsequent 11/27/2014   Follows with pulmonology, Dr Gwenette Greet Follow with cardiology, Dr Rance Muir with Dr Matthew Saras Does not participate in [facility], pap or colonoscopy screening at this time.  . Mumps as a child  . Obese   . Ovarian cyst   . Pelvic mass in female   . Peripheral neuropathy ([place]) 02/26/2012  . Precancerous skin lesion   . Skin cancer     left temple  . Thyroid disease    hypothroidism  . Vitamin D deficiency     Past Surgical History:  Procedure Laterality Date  . ABDOMINAL HYSTERECTOMY    . CATARACT EXTRACTION     Dr. Satira Sark  . SKIN BIOPSY     left temple. cancerous  . TOTAL HIP ARTHROPLASTY  2003   left    Social History   Social History  . Marital status: Widowed    Spouse name: N/A  . Number of children: 25  . Years of education: N/A   Occupational History  . retired Retired   Social History Main Topics  . Smoking status: Former Smoker    Packs/day: 0.30    Years: 5.00    Types: Cigarettes    Quit date: 10/28/1973  . Smokeless tobacco: Never Used  . Alcohol use Yes     Comment: socially  . Drug use: No  . Sexual activity: Not on file   Other Topics Concern  . Not on file   Social History Narrative  . No narrative on file      Allergies as of 02/05/2017      Reactions   Amiodarone Other (See Comments)   Amiodarone pulmonary toxicity.   Aspercreme [trolamine Salicylate] Other (See Comments)   Pt states it  caused redness like it was burning her skin.      Medication List       Accurate as of 02/05/17 11:44 AM. Always use your most recent med list.          acetaminophen 500 MG tablet Commonly known as:  TYLENOL Take 2 tablets (1,000 mg total) by mouth every 8 (eight) hours as needed for pain (max of 3000 mg in 24 hours).   dabigatran 150 MG Caps capsule Commonly known as:  PRADAXA Take 1 capsule (150 mg total) by mouth 2 (two) times daily.   diltiazem 360 MG 24 hr capsule Commonly known as:  CARDIZEM CD TAKE 1 CAPSULE DAILY   furosemide 20 MG tablet Commonly known as:  LASIX Take 1 tablet (20 mg total) by mouth as needed.   losartan 25 MG tablet Commonly known as:  COZAAR TAKE 1 TABLET DAILY IN THE EVENING   PHILLIPS STOOL SOFTENER PO Take by mouth.   PROBIOTIC DAILY PO Take 1 tablet by mouth daily.   ranitidine 150 MG tablet Commonly known as:  ZANTAC Take 1  tablet (150 mg total) by mouth daily as needed for heartburn.   VITAMIN D PO Take by mouth.          Objective:   Physical Exam BP 132/84 (BP Location: Left Wrist, Patient Position: Sitting, Cuff Size: Small)   Pulse 76   Temp 97.7 F (36.5 C) (Oral)   Resp 14   Ht 5\' 6"  (1.676 m)   Wt 242 lb (109.8 kg)   LMP 12/25/2011   SpO2 96%   BMI 39.06 kg/m   General:   Well developed, obese lady, sitting in a wheelchair in no distress.  HEENT:  Normocephalic . Face symmetric, has excoriation of the lower lip, inner aspect with ecchymosis, no open wounds or bleeding. Lungs:  CTA B Normal respiratory effort, no intercostal retractions, no accessory muscle use. Heart: Seems regular today Lower extremities: Right foot and ankle: Normal to inspection, palpation, good pedal pulse Left foot and ankle: + Dorsum swelling, good pedal pulse, foot is warm and pink. + TTP (mild to moderate) at the dorsum and external malleolus. Range of motion of the ankle seems okay without triggering pain. Calves symmetric , no TTP Neurologic:  alert & oriented X3.  Speech normal, gait not tested Psych--  Cognition and judgment appear intact.  Cooperative with normal attention span and concentration.  Behavior appropriate. No anxious or depressed appearing.      Assessment & Plan:   83 year old lady with history of A. fib, GERD, hyperglycemia, hyperlipidemia, HTN, hypothyroidism, OSA,  neuropathy, obesity, anticoagulated with Pradaxa presents with the following.  Mechanical fall yesterday, left foot/ankle injury, lip injury. We talk about fall prevention, cont using 2 canes, consider a walker and PT referral, to discuss with PCP. States that she would be reluctant to do that.  Will get x-rays of the foot and ankle, recommend ice, leg elevation, Tylenol, avoid NSAIDs Refer to sports medicine. Lip injury w/ no current bleeding.

## 2017-02-05 NOTE — Progress Notes (Signed)
Pre visit review using our clinic review tool, if applicable. No additional management support is needed unless otherwise documented below in the visit note. 

## 2017-02-05 NOTE — Patient Instructions (Signed)
Get the x-rays downstairs  We are referring you to a sports medicine doctor  Keep the leg elevated  ICE the ankle to 3 times a day for 15 minutes for the next 2 or 3 days  After 2 or 3 days, use heat instead  Tylenol  500 mg OTC 2 tabs a day every 8 hours as needed for pain  Call if severe pain or swelling.   Fall Prevention in the Home Falls can cause injuries and can affect people from all age groups. There are many simple things that you can do to make your home safe and to help prevent falls. What can I do on the outside of my home?  Regularly repair the edges of walkways and driveways and fix any cracks.  Remove high doorway thresholds.  Trim any shrubbery on the main path into your home.  Use bright outdoor lighting.  Clear walkways of debris and clutter, including tools and rocks.  Regularly check that handrails are securely fastened and in good repair. Both sides of any steps should have handrails.  Install guardrails along the edges of any raised decks or porches.  Have leaves, snow, and ice cleared regularly.  Use sand or salt on walkways during winter months.  In the garage, clean up any spills right away, including grease or oil spills. What can I do in the bathroom?  Use night lights.  Install grab bars by the toilet and in the tub and shower. Do not use towel bars as grab bars.  Use non-skid mats or decals on the floor of the tub or shower.  If you need to sit down while you are in the shower, use a plastic, non-slip stool.  Keep the floor dry. Immediately clean up any water that spills on the floor.  Remove soap buildup in the tub or shower on a regular basis.  Attach bath mats securely with double-sided non-slip rug tape.  Remove throw rugs and other tripping hazards from the floor. What can I do in the bedroom?  Use night lights.  Make sure that a bedside light is easy to reach.  Do not use oversized bedding that drapes onto the  floor.  Have a firm chair that has side arms to use for getting dressed.  Remove throw rugs and other tripping hazards from the floor. What can I do in the kitchen?  Clean up any spills right away.  Avoid walking on wet floors.  Place frequently used items in easy-to-reach places.  If you need to reach for something above you, use a sturdy step stool that has a grab bar.  Keep electrical cables out of the way.  Do not use floor polish or wax that makes floors slippery. If you have to use wax, make sure that it is non-skid floor wax.  Remove throw rugs and other tripping hazards from the floor. What can I do in the stairways?  Do not leave any items on the stairs.  Make sure that there are handrails on both sides of the stairs. Fix handrails that are broken or loose. Make sure that handrails are as long as the stairways.  Check any carpeting to make sure that it is firmly attached to the stairs. Fix any carpet that is loose or worn.  Avoid having throw rugs at the top or bottom of stairways, or secure the rugs with carpet tape to prevent them from moving.  Make sure that you have a light switch at the top of  the stairs and the bottom of the stairs. If you do not have them, have them installed. What are some other fall prevention tips?  Wear closed-toe shoes that fit well and support your feet. Wear shoes that have rubber soles or low heels.  When you use a stepladder, make sure that it is completely opened and that the sides are firmly locked. Have someone hold the ladder while you are using it. Do not climb a closed stepladder.  Add color or contrast paint or tape to grab bars and handrails in your home. Place contrasting color strips on the first and last steps.  Use mobility aids as needed, such as canes, walkers, scooters, and crutches.  Turn on lights if it is dark. Replace any light bulbs that burn out.  Set up furniture so that there are clear paths. Keep the  furniture in the same spot.  Fix any uneven floor surfaces.  Choose a carpet design that does not hide the edge of steps of a stairway.  Be aware of any and all pets.  Review your medicines with your healthcare provider. Some medicines can cause dizziness or changes in blood pressure, which increase your risk of falling. Talk with your health care provider about other ways that you can decrease your risk of falls. This may include working with a physical therapist or trainer to improve your strength, balance, and endurance. This information is not intended to replace advice given to you by your health care provider. Make sure you discuss any questions you have with your health care provider. Document Released: 10/04/2002 Document Revised: 03/12/2016 Document Reviewed: 11/18/2014 Elsevier Interactive Patient Education  2017 [employer].

## 2017-02-06 ENCOUNTER — Encounter: Payer: Self-pay | Admitting: Nurse Practitioner

## 2017-02-06 ENCOUNTER — Ambulatory Visit (INDEPENDENT_AMBULATORY_CARE_PROVIDER_SITE_OTHER): Payer: Medicare Other | Admitting: Nurse Practitioner

## 2017-02-06 VITALS — BP 153/99 | HR 87 | Ht 66.0 in | Wt 237.4 lb

## 2017-02-06 DIAGNOSIS — I10 Essential (primary) hypertension: Secondary | ICD-10-CM | POA: Diagnosis not present

## 2017-02-06 DIAGNOSIS — I482 Chronic atrial fibrillation, unspecified: Secondary | ICD-10-CM

## 2017-02-06 LAB — CBC
HCT: 44.6 % (ref 35.0–45.0)
HEMOGLOBIN: 14.7 g/dL (ref 11.7–15.5)
MCH: 31.2 pg (ref 27.0–33.0)
MCHC: 33 g/dL (ref 32.0–36.0)
MCV: 94.7 fL (ref 80.0–100.0)
MPV: 10.1 fL (ref 7.5–12.5)
PLATELETS: 224 10*3/uL (ref 140–400)
RBC: 4.71 MIL/uL (ref 3.80–5.10)
RDW: 13.3 % (ref 11.0–15.0)
WBC: 8.4 10*3/uL (ref 3.8–10.8)

## 2017-02-06 LAB — BASIC METABOLIC PANEL
BUN: 16 mg/dL (ref 7–25)
CHLORIDE: 105 mmol/L (ref 98–110)
CO2: 24 mmol/L (ref 20–31)
CREATININE: 0.81 mg/dL (ref 0.60–0.88)
Calcium: 9.4 mg/dL (ref 8.6–10.4)
GLUCOSE: 97 mg/dL (ref 65–99)
Potassium: 5.2 mmol/L (ref 3.5–5.3)
Sodium: 139 mmol/L (ref 135–146)

## 2017-02-06 NOTE — Patient Instructions (Signed)
Medication Instructions:  Your physician recommends that you continue on your current medications as directed. Please refer to the Current Medication list given to you today.  Labwork: Please have labwork today (CBC, BMET).  Follow-Up: Your physician wants you to follow-up in: 6 MONTHS with Dr. Stanford Breed. You will receive a reminder letter in the mail two months in advance. If you don't receive a letter, please call our office to schedule the follow-up appointment.   Any Other Special Instructions Will Be Listed Below (If Applicable).  Your physician has requested that you regularly monitor and record your blood pressure readings at home ONCE DAILY. Please use the same machine at the same time of day to check your readings and record them to bring to your follow-up visit.     If you need a refill on your cardiac medications before your next appointment, please call your pharmacy.

## 2017-02-06 NOTE — Progress Notes (Signed)
Office Visit    Patient Name: Gina Reilly Date of Encounter: 02/06/2017  Primary Care Provider:  Penni Homans, MD Primary Cardiologist:  B. Stanford Breed, MD   Chief Complaint    83 year old female with a history of chronic atrial fibrillation, hypertension, hyperlipidemia, and obesity who presents for six-month follow-up.  Past Medical History    Past Medical History:  Diagnosis Date  . 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 Anemia   . Apnea, sleep 04/24/2012  . Arthritis    osteo  . Bronchitis 12/25/2011  . Chicken pox as a child  . Chronic atrial fibrillation ([place])    a. 07/2010 TEE/DCCV:  EF 60-65% mild MR.; b. Recurrent Afib--> Amiodarone;  Pradaxa (CHA2DS2VASc = 4);  c. Amio d/c'd 2/2 toxicity - now chronic rate-controlled afib.  . Constipation 11/22/2014  . Contusion of leg, left 01/16/2012  . GERD (gastroesophageal reflux disease)   . Hyperglycemia 01/10/2010   Qualifier: Diagnosis of  By: Niel Hummer MD, [place] Hyperlipidemia   . Hypertension   . Hyperthyroidism 02/09/2009   Qualifier: Diagnosis of  By: Niel Hummer MD, [place] Hypoxia 04/14/2012  . Intermittent claudication ([place]) 05/22/2012  . Measles as a child  . Medicare annual wellness visit, subsequent 11/27/2014   Follows with pulmonology, Dr Gwenette Greet Follow with cardiology, Dr Rance Muir with Dr Matthew Saras Does not participate in [facility], pap or colonoscopy screening at this time.  . Mumps as a child  . Obese   . Ovarian cyst   . Pelvic mass in female   . Peripheral neuropathy ([place]) 02/26/2012  . Precancerous skin lesion   . Skin cancer    left temple  . Thyroid disease    hypothroidism  . Vitamin D deficiency    Past Surgical History:  Procedure Laterality Date  . ABDOMINAL HYSTERECTOMY    . CATARACT EXTRACTION     Dr. Satira Sark  . SKIN BIOPSY     left temple. cancerous  . TOTAL HIP  ARTHROPLASTY  2003   left    Allergies  Allergies  Allergen Reactions  . Amiodarone Other (See Comments)    Amiodarone pulmonary toxicity.  . Aspercreme [Trolamine Salicylate] Other (See Comments)    Pt states it caused redness like it was burning her skin.    History of Present Illness    83 year old female with the above past medical history including atrial fibrillation status post prior attempt at rhythm management, though she has been in persistent/chronic atrial fibrillation since 2016. She is anticoagulated with Pradaxa and was previously treated with amiodarone, though this resulted in pulmonary toxicity subsequently discontinued. Other history includes hypertension, hyperlipidemia, obesity, sleep apnea on CPAP, and GERD. Last echo was in October 2016 showing normal LV function, mild biatrial enlargement, and mild mitral regurgitation. Holter monitor at that time showed rate-controlled atrial fibrillation. She was last seen in clinic approximately 6 months ago, at which time she was doing reasonably well. She continues to live on her own and is quite independent. She still drives and is active in and around her house. She does have friends that can help her out in a pinch. She was out in the yard a few days ago and lost her balance and fell. With that, she has had left foot and ankle pain. She was seen by primary care yesterday with x-rays  showing soft tissue swelling but no evidence of fractures. Other than that, she's been doing well. She has not had any chest pain, dyspnea, palpitations, PND, orthopnea, dizziness, syncope, or early satiety.  Home Medications    Prior to Admission medications   Medication Sig Start Date End Date Taking? Authorizing Provider  acetaminophen (TYLENOL) 500 MG tablet Take 2 tablets (1,000 mg total) by mouth every 8 (eight) hours as needed for pain (max of 3000 mg in 24 hours). 06/25/13  Yes Mosie Lukes, MD  Cholecalciferol (VITAMIN D PO) Take 1 capsule  by mouth daily.    Yes Historical Provider, MD  dabigatran (PRADAXA) 150 MG CAPS capsule Take 1 capsule (150 mg total) by mouth 2 (two) times daily. 08/26/16  Yes Lelon Perla, MD  diltiazem (CARDIZEM CD) 360 MG 24 hr capsule TAKE 1 CAPSULE DAILY 01/20/17  Yes Lelon Perla, MD  Docusate Sodium (PHILLIPS STOOL SOFTENER PO) Take 1 tablet by mouth as needed.    Yes Historical Provider, MD  furosemide (LASIX) 20 MG tablet Take 1 tablet (20 mg total) by mouth as needed. 11/22/14  Yes Mosie Lukes, MD  losartan (COZAAR) 25 MG tablet TAKE 1 TABLET DAILY IN THE EVENING 01/20/17  Yes Lelon Perla, MD  Probiotic Product (PROBIOTIC DAILY PO) Take 1 tablet by mouth daily.   Yes Historical Provider, MD  ranitidine (ZANTAC) 150 MG tablet Take 1 tablet (150 mg total) by mouth daily as needed for heartburn. 11/22/14  Yes Mosie Lukes, MD    Review of Systems    Left foot and ankle pain in the setting of a mechanical fall the other day.  No chest pain, dyspnea, palpitations, PND, orthopnea, dizziness, syncope, edema, or early satiety. All other systems reviewed and are otherwise negative except as noted above.  Physical Exam    VS:  BP (!) 153/99   Pulse 87   Ht 5\' 6"  (1.676 m)   Wt 237 lb 6.4 oz (107.7 kg)   LMP 12/25/2011   BMI 38.32 kg/m  , BMI Body mass index is 38.32 kg/m. GEN: Well nourished, well developed, in no acute distress.  HEENT: normal.  Neck: Supple, no JVD, carotid bruits, or masses. Cardiac: IR, IR, no murmurs, rubs, or gallops. No clubbing, cyanosis, edema.  Radials/DP/PT 2+ and equal bilaterally.  Respiratory:  Respirations regular and unlabored, clear to auscultation bilaterally. GI: Soft, nontender, nondistended, BS + x 4. MS: Mild left ankle and pedal edema in setting of recent fall. Skin: warm and dry, no rash. Neuro:  Strength and sensation are intact. Psych: Normal affect.  Accessory Clinical Findings    ECG - Atrial fibrillation, 87, left axis deviation,  PVC.   Assessment & Plan    1.  Chronic atrial fibrillation: This is well rate controlled on oral diltiazem therapy. She is anticoagulated with Pradaxa in the setting of a CHA2DS2VASc of 4. She remains active and asymptomatic. Labs were drawn 6 months ago and were stable. Repeat CBC and bmet today.  2.  Essential hypertension: Blood pressure is elevated in clinic today. She was seen by primary care yesterday and I reviewed that note. Pressure yesterday was 132/84. She has a cuff at home but does not routinely use it. I have asked her to check her blood pressure daily at home and contact us if she is consistently running over 140. If so, we would likely consider increasing her losartan to 50 mg daily.   3. Obstructive sleep apnea: She  is compliant with CPAP.  4. Right foot/ankle pain:  Following mechanical fall the other day.  Xrays neg for fx.  Using RICE.  5.  Disposition: Follow-up CBC and basic metabolic panel today. Follow-up with Dr. Stanford Breed in 6 months or sooner if necessary.    Murray Hodgkins, NP 02/06/2017, 4:25 PM

## 2017-02-10 ENCOUNTER — Encounter: Payer: Self-pay | Admitting: *Deleted

## 2017-04-22 DIAGNOSIS — B353 Tinea pedis: Secondary | ICD-10-CM | POA: Diagnosis not present

## 2017-06-02 ENCOUNTER — Encounter: Payer: Self-pay | Admitting: Family Medicine

## 2017-06-02 NOTE — Progress Notes (Signed)
[facility]  Diagnosis: Tinea pedis

## 2017-08-18 NOTE — Progress Notes (Signed)
HPI: FU atrial fibrillation. Patient has a history of amiodarone lung toxicity as well; h/o bradycardia. Echo 10/16 showed normal LV function, mild LAE, RAE,RVE, mild MR. Holter monitor 10/16 showed afib rate controlled. Since she was last seen, pt denies dyspnea, palpitations, CP or syncope; no bleeding  Current Outpatient Prescriptions  Medication Sig Dispense Refill  . acetaminophen (TYLENOL) 500 MG tablet Take 1,000 mg by mouth 2 (two) times daily.    . Cholecalciferol (VITAMIN D PO) Take 1 capsule by mouth daily.     . dabigatran (PRADAXA) 150 MG CAPS capsule Take 1 capsule (150 mg total) by mouth 2 (two) times daily. 180 capsule 3  . diltiazem (CARDIZEM CD) 360 MG 24 hr capsule TAKE 1 CAPSULE DAILY 90 capsule 2  . Docusate Sodium (PHILLIPS STOOL SOFTENER PO) Take 1 tablet by mouth as needed.     . furosemide (LASIX) 20 MG tablet Take 1 tablet (20 mg total) by mouth as needed. 30 tablet 3  . losartan (COZAAR) 25 MG tablet TAKE 1 TABLET DAILY IN THE EVENING 90 tablet 2  . Probiotic Product (PROBIOTIC DAILY PO) Take 1 tablet by mouth daily.    . ranitidine (ZANTAC) 150 MG tablet Take 1 tablet (150 mg total) by mouth daily as needed for heartburn. 30 tablet 1   No current facility-administered medications for this visit.      Past Medical History:  Diagnosis Date  . 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 Anemia   . Apnea, sleep 04/24/2012  . Arthritis    osteo  . Bronchitis 12/25/2011  . Chicken pox as a child  . Chronic atrial fibrillation ([place])    a. 07/2010 TEE/DCCV:  EF 60-65% mild MR.; b. Recurrent Afib--> Amiodarone;  Pradaxa (CHA2DS2VASc = 4);  c. Amio d/c'd 2/2 toxicity - now chronic rate-controlled afib.  . Constipation 11/22/2014  . Contusion of leg, left 01/16/2012  . GERD (gastroesophageal reflux disease)   . History of echocardiogram    a. 07/2015  Echo: EF 60-65%, Ao sclerosis, mild MR, mildly dil LA/RV/RA, mild Tr, PASP 40mmHg.  [place] Hyperglycemia 01/10/2010   Qualifier: Diagnosis of  By: Niel Hummer MD, [place] Hyperlipidemia   . Hypertension   . Hyperthyroidism 02/09/2009   Qualifier: Diagnosis of  By: Niel Hummer MD, [place] Hypoxia 04/14/2012  . Intermittent claudication ([place]) 05/22/2012  . Measles as a child  . Medicare annual wellness visit, subsequent 11/27/2014   Follows with pulmonology, Dr Gwenette Greet Follow with cardiology, Dr Rance Muir with Dr Matthew Saras Does not participate in [facility], pap or colonoscopy screening at this time.  . Mumps as a child  . Obese   . Ovarian cyst   . Pelvic mass in female   . Peripheral neuropathy 02/26/2012  . Precancerous skin lesion   . Skin cancer    left temple  . Thyroid disease    hypothroidism  . Vitamin D deficiency     Past Surgical History:  Procedure Laterality Date  . ABDOMINAL HYSTERECTOMY    . CATARACT EXTRACTION     Dr. Satira Sark  . SKIN BIOPSY     left temple. cancerous  . TOTAL HIP ARTHROPLASTY  2003   left    Social History   Social History  . Marital status: Widowed    Spouse name: N/A  .  Number of children: 11  . Years of education: N/A   Occupational History  . retired Retired   Social History Main Topics  . Smoking status: Former Smoker    Packs/day: 0.30    Years: 5.00    Types: Cigarettes    Quit date: 10/28/1973  . Smokeless tobacco: Never Used  . Alcohol use Yes     Comment: socially  . Drug use: No  . Sexual activity: Not on file   Other Topics Concern  . Not on file   Social History Narrative  . No narrative on file    Family History  Problem Relation Age of Onset  . Stroke Mother   . Prostate cancer Father        prostate  . Hypertension Brother   . Cancer Maternal Grandmother   . Hypertension Brother   . Heart disease Brother   . Obesity Brother   . Hypertension Brother   . Hyperlipidemia Brother   . Heart disease Brother    . Kidney disease Brother   . Cancer Daughter        kidney- spine and blood stream  . Heart disease Son        valvular heart disease  . Prostate cancer Brother     ROS: cold feet and diaphoresis but no fevers or chills, productive cough, hemoptysis, dysphasia, odynophagia, melena, hematochezia, dysuria, hematuria, rash, seizure activity, orthopnea, PND, pedal edema, claudication. Remaining systems are negative.  Physical Exam: Well-developed obese in no acute distress.  Skin is warm and dry.  HEENT is normal.  Neck is supple.  Chest is clear to auscultation with normal expansion.  Cardiovascular exam is irregular Abdominal exam nontender or distended. No masses palpated. Extremities show no edema. neuro grossly intact  A/P  1 permanent atrial fibrillation-plan to continue Cardizem for rate control. Continue pradaxa. Check hemoglobin and renal function.  2 Hypertension-blood pressure controlled. Continue present medications.  3 OSA-continue CPAP.  Kirk Ruths, MD

## 2017-08-26 ENCOUNTER — Ambulatory Visit (INDEPENDENT_AMBULATORY_CARE_PROVIDER_SITE_OTHER): Payer: Medicare Other | Admitting: Cardiology

## 2017-08-26 ENCOUNTER — Encounter: Payer: Self-pay | Admitting: Cardiology

## 2017-08-26 VITALS — BP 141/88 | HR 80 | Ht 68.0 in | Wt 234.0 lb

## 2017-08-26 DIAGNOSIS — I10 Essential (primary) hypertension: Secondary | ICD-10-CM

## 2017-08-26 DIAGNOSIS — I480 Paroxysmal atrial fibrillation: Secondary | ICD-10-CM | POA: Diagnosis not present

## 2017-08-26 DIAGNOSIS — I482 Chronic atrial fibrillation, unspecified: Secondary | ICD-10-CM

## 2017-08-26 NOTE — Patient Instructions (Signed)
Medication Instructions:   NO CHANGE  LAB WORK:  Your physician recommends that you HAVE LAB WORK TODAY  Follow-Up:  Your physician wants you to follow-up in: [place] will receive a reminder letter in the mail two months in advance. If you don't receive a letter, please call our office to schedule the follow-up appointment.   If you need a refill on your cardiac medications before your next appointment, please call your pharmacy.

## 2017-08-27 ENCOUNTER — Telehealth: Payer: Self-pay | Admitting: *Deleted

## 2017-08-27 DIAGNOSIS — E875 Hyperkalemia: Secondary | ICD-10-CM

## 2017-08-27 LAB — BASIC METABOLIC PANEL
BUN / CREAT RATIO: 21 (ref 12–28)
BUN: 26 mg/dL (ref 10–36)
CHLORIDE: 104 mmol/L (ref 96–106)
CO2: 23 mmol/L (ref 20–29)
Calcium: 9.5 mg/dL (ref 8.7–10.3)
Creatinine, Ser: 1.23 mg/dL — ABNORMAL HIGH (ref 0.57–1.00)
GFR calc non Af Amer: 38 mL/min/{1.73_m2} — ABNORMAL LOW (ref >59)
GFR, EST AFRICAN AMERICAN: 44 mL/min/{1.73_m2} — AB (ref >59)
Glucose: 101 mg/dL — ABNORMAL HIGH (ref 65–99)
POTASSIUM: 5.8 mmol/L — AB (ref 3.5–5.2)
Sodium: 140 mmol/L (ref 134–144)

## 2017-08-27 LAB — CBC
Hematocrit: 44.2 % (ref 34.0–46.6)
Hemoglobin: 14.6 g/dL (ref 11.1–15.9)
MCH: 31.7 pg (ref 26.6–33.0)
MCHC: 33 g/dL (ref 31.5–35.7)
MCV: 96 fL (ref 79–97)
PLATELETS: 217 10*3/uL (ref 150–379)
RBC: 4.6 x10E6/uL (ref 3.77–5.28)
RDW: 13.8 % (ref 12.3–15.4)
WBC: 8.5 10*3/uL (ref 3.4–10.8)

## 2017-08-27 NOTE — Telephone Encounter (Addendum)
-----   Message from Lelon Perla, MD sent at 08/27/2017  7:25 AM EDT ----- Hold lasix; low K diet; repeat bmet today Kirk Ruths   Left message for pt to call prior to taking morning medications.

## 2017-08-27 NOTE — Telephone Encounter (Signed)
Spoke with pt, she has not taken any furosemide in a while. She watches potassium in her diet. She will come by and have lab work repeated.

## 2017-08-27 NOTE — Telephone Encounter (Signed)
Returning your call. °

## 2017-08-28 DIAGNOSIS — E875 Hyperkalemia: Secondary | ICD-10-CM | POA: Diagnosis not present

## 2017-08-29 LAB — BASIC METABOLIC PANEL
BUN / CREAT RATIO: 27 (ref 12–28)
BUN: 22 mg/dL (ref 10–36)
CHLORIDE: 103 mmol/L (ref 96–106)
CO2: 20 mmol/L (ref 20–29)
CREATININE: 0.81 mg/dL (ref 0.57–1.00)
Calcium: 9.5 mg/dL (ref 8.7–10.3)
GFR calc Af Amer: 73 mL/min/{1.73_m2} (ref >59)
GFR calc non Af Amer: 64 mL/min/{1.73_m2} (ref >59)
GLUCOSE: 93 mg/dL (ref 65–99)
Potassium: 5.1 mmol/L (ref 3.5–5.2)
SODIUM: 138 mmol/L (ref 134–144)

## 2017-09-05 NOTE — Progress Notes (Signed)
Subjective:   Gina Reilly is a 83 y.o. female who presents for Medicare Annual (Subsequent) preventive examination. Pt is very sharp and energetic.  Review of Systems:  No ROS.  Medicare Wellness Visit. Additional risk factors are reflected in the social history.  Cardiac Risk Factors include: advanced age (>38men, >75 women);dyslipidemia;hypertension Sleep patterns: wears CPAP at night. Wakes frequently to urinate. Goes back to sleep easily. Naps as needed.    Female:     Mammo- No longer doing routine screening due to age.     Dexa scan- No longer doing routine screening due to age.         Objective:     Vitals: BP (!) 143/80 (BP Location: Left Wrist, Patient Position: Sitting, Cuff Size: Normal)   Pulse 90   Ht 5\' 8"  (1.727 m)   Wt 234 lb (106.1 kg)   LMP 12/25/2011   SpO2 96%   BMI 35.58 kg/m   Body mass index is 35.58 kg/m.   Tobacco Social History   Tobacco Use  Smoking Status Former Smoker  . Packs/day: 0.30  . Years: 5.00  . Pack years: 1.50  . Types: Cigarettes  . Last attempt to quit: 10/28/1973  . Years since quitting: 43.8  Smokeless Tobacco Never Used     Counseling given: Not Answered   Past Medical History:  Diagnosis Date  . 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 Anemia   . Apnea, sleep 04/24/2012  . Arthritis    osteo  . Bronchitis 12/25/2011  . Chicken pox as a child  . Chronic atrial fibrillation ([place])    a. 07/2010 TEE/DCCV:  EF 60-65% mild MR.; b. Recurrent Afib--> Amiodarone;  Pradaxa (CHA2DS2VASc = 4);  c. Amio d/c'd 2/2 toxicity - now chronic rate-controlled afib.  . Constipation 11/22/2014  . Contusion of leg, left 01/16/2012  . GERD (gastroesophageal reflux disease)   . History of echocardiogram    a. 07/2015 Echo: EF 60-65%, Ao sclerosis, mild MR, mildly dil LA/RV/RA, mild Tr, PASP 62mmHg.  Marland Kitchen Hyperglycemia 01/10/2010   Qualifier: Diagnosis of  By: Niel Hummer MD, [place] Hyperlipidemia   . Hypertension   . Hyperthyroidism 02/09/2009   Qualifier: Diagnosis of  By: Niel Hummer MD, [place] Hypoxia 04/14/2012  . Intermittent claudication ([place]) 05/22/2012  . Measles as a child  . Medicare annual wellness visit, subsequent 11/27/2014   Follows with pulmonology, Dr Gwenette Greet Follow with cardiology, Dr Rance Muir with Dr Matthew Saras Does not participate in [facility], pap or colonoscopy screening at this time.  . Mumps as a child  . Obese   . Ovarian cyst   . Pelvic mass in female   . Peripheral neuropathy 02/26/2012  . Precancerous skin lesion   . Skin cancer    left temple  . Thyroid disease    hypothroidism  . Vitamin D deficiency    Past Surgical History:  Procedure Laterality Date  . ABDOMINAL HYSTERECTOMY    . CATARACT EXTRACTION     Dr. Satira Sark  . SKIN BIOPSY     left temple. cancerous  . TOTAL HIP ARTHROPLASTY  2003   left   Family History  Problem Relation Age of Onset  . Stroke Mother   . Prostate cancer Father        prostate  . Hypertension Brother   .  Cancer Maternal Grandmother   . Hypertension Brother   . Heart disease Brother   . Obesity Brother   . Hypertension Brother   . Hyperlipidemia Brother   . Heart disease Brother   . Kidney disease Brother   . Cancer Daughter        kidney- spine and blood stream  . Heart disease Son        valvular heart disease  . Prostate cancer Brother    Social History   Substance and Sexual Activity  Sexual Activity Not on file    Outpatient Encounter Medications as of 09/09/2017  Medication Sig  . acetaminophen (TYLENOL) 500 MG tablet Take 1,000 mg by mouth 2 (two) times daily.  . dabigatran (PRADAXA) 150 MG CAPS capsule Take 1 capsule (150 mg total) by mouth 2 (two) times daily.  Marland Kitchen diltiazem (CARDIZEM CD) 360 MG 24 hr capsule TAKE 1 CAPSULE DAILY  . Docusate Sodium (PHILLIPS STOOL SOFTENER PO) Take 1 tablet by mouth as needed.   .  furosemide (LASIX) 20 MG tablet Take 1 tablet (20 mg total) by mouth as needed.  Marland Kitchen losartan (COZAAR) 25 MG tablet TAKE 1 TABLET DAILY IN THE EVENING  . Probiotic Product (PROBIOTIC DAILY PO) Take 1 tablet by mouth daily.  . Cholecalciferol (VITAMIN D PO) Take 1 capsule by mouth daily.   . ranitidine (ZANTAC) 150 MG tablet Take 1 tablet (150 mg total) by mouth daily as needed for heartburn. (Patient not taking: Reported on 09/09/2017)   No facility-administered encounter medications on file as of 09/09/2017.     Activities of Daily Living In your present state of health, do you have any difficulty performing the following activities: 09/09/2017  Hearing? Y  Comment pt has hearing aids, but refuses to wear them.  Vision? N  Comment wearing glasses.  Difficulty concentrating or making decisions? N  Walking or climbing stairs? Y  Comment uses 2 canes.  Dressing or bathing? N  Doing errands, shopping? N  Preparing Food and eating ? N  Using the Toilet? N  In the past six months, have you accidently leaked urine? Y  Do you have problems with loss of bowel control? N  Managing your Medications? N  Managing your Finances? N  Housekeeping or managing your Housekeeping? N  Some recent data might be hidden    Patient Care Team: Mosie Lukes, MD as PCP - General (Family Medicine) Ralene Bathe, MD as Consulting Physician (Ophthalmology) Stanford Breed Denice Bors, MD as Consulting Physician (Cardiology)    Assessment:    Physical assessment deferred to PCP.  Exercise Activities and Dietary recommendations Current Exercise Habits: The patient does not participate in regular exercise at present Diet (meal preparation, eat out, water intake, caffeinated beverages, dairy products, fruits and vegetables): in general, a "healthy" diet       Goal: Maintain independence. Fall Risk Fall Risk  09/09/2017 09/05/2016 01/25/2016 11/22/2014 11/22/2014  Falls in the past year? Yes No No No No  Number  falls in past yr: 1 - - - -  Injury with Fall? Yes - - - -  Follow up Education provided;Falls prevention discussed - - - -   Depression Screen PHQ 2/9 Scores 09/09/2017 09/05/2016 01/25/2016 11/22/2014  PHQ - 2 Score 0 0 0 0     Cognitive Function MMSE - Mini Mental State Exam 09/09/2017 09/05/2016  Orientation to time 5 5  Orientation to Place 5 5  Registration 3 3  Attention/ Calculation 5 5  Recall  3 3  Language- name 2 objects 2 2  Language- repeat 1 1  Language- follow 3 step command 3 3  Language- read & follow direction 1 1  Write a sentence 1 1  Copy design 1 1  Total score 30 30        Immunization History  Administered Date(s) Administered  . Pneumococcal Polysaccharide-23 01/25/2016   Screening Tests Health Maintenance  Topic Date Due  . TETANUS/TDAP  12/23/1942  . INFLUENZA VACCINE  02/02/2018 (Originally 05/28/2017)  . DEXA SCAN  03/02/2018 (Originally 12/23/1990)  . PNA vac Low Risk Adult (2 of 2 - PCV13) 09/09/2018 (Originally 01/24/2017)      Plan:   Follow up with Dr.Blyth as scheduled.  Continue to eat heart healthy diet (full of fruits, vegetables, whole grains, lean protein, water--limit salt, fat, and sugar intake) and increase physical activity as tolerated.  Continue doing brain stimulating activities (puzzles, reading, adult coloring books, staying active) to keep memory sharp.   Keep up the great work!!! You are AMAZING!!  I have personally reviewed and noted the following in the patient's chart:   . Medical and social history . Use of alcohol, tobacco or illicit drugs  . Current medications and supplements . Functional ability and status . Nutritional status . Physical activity . Advanced directives . List of other physicians . Hospitalizations, surgeries, and ER visits in previous 12 months . Vitals . Screenings to include cognitive, depression, and falls . Referrals and appointments  In addition, I have reviewed and discussed with  patient certain preventive protocols, quality metrics, and best practice recommendations. A written personalized care plan for preventive services as well as general preventive health recommendations were provided to patient.     Shela Nevin, [place]  09/09/2017

## 2017-09-09 ENCOUNTER — Encounter: Payer: Self-pay | Admitting: *Deleted

## 2017-09-09 ENCOUNTER — Ambulatory Visit (INDEPENDENT_AMBULATORY_CARE_PROVIDER_SITE_OTHER): Payer: Medicare Other | Admitting: *Deleted

## 2017-09-09 VITALS — BP 143/80 | HR 90 | Ht 68.0 in | Wt 234.0 lb

## 2017-09-09 DIAGNOSIS — Z0000 Encounter for general adult medical examination without abnormal findings: Secondary | ICD-10-CM

## 2017-09-09 NOTE — Patient Instructions (Addendum)
Follow up with Dr.Blyth as scheduled.  Continue to eat heart healthy diet (full of fruits, vegetables, whole grains, lean protein, water--limit salt, fat, and sugar intake) and increase physical activity as tolerated.  Continue doing brain stimulating activities (puzzles, reading, adult coloring books, staying active) to keep memory sharp.   Keep up the great work!!! You are AMAZING!!  [name] , Thank you for taking time to come for your Medicare Wellness Visit. I appreciate your ongoing commitment to your health goals. Please review the following plan we discussed and let me know if I can assist you in the future.   These are the goals we discussed:Maintain independence.   This is a list of the screening recommended for you and due dates:  Health Maintenance  Topic Date Due  . Tetanus Vaccine  12/23/1942  . Flu Shot  02/02/2018*  . DEXA scan (bone density measurement)  03/02/2018*  . Pneumonia vaccines (2 of 2 - PCV13) 09/09/2018*  *Topic was postponed. The date shown is not the original due date.    Health Maintenance for Postmenopausal Women Menopause is a normal process in which your reproductive ability comes to an end. This process happens gradually over a span of months to years, usually between the ages of 80 and 9. Menopause is complete when you have missed 12 consecutive menstrual periods. It is important to talk with your health care provider about some of the most common conditions that affect postmenopausal women, such as heart disease, cancer, and bone loss (osteoporosis). Adopting a healthy lifestyle and getting preventive care can help to promote your health and wellness. Those actions can also lower your chances of developing some of these common conditions. What should I know about menopause? During menopause, you may experience a number of symptoms, such as:  Moderate-to-severe hot flashes.  Night sweats.  Decrease in sex drive.  Mood  swings.  Headaches.  Tiredness.  Irritability.  Memory problems.  Insomnia.  Choosing to treat or not to treat menopausal changes is an individual decision that you make with your health care provider. What should I know about hormone replacement therapy and supplements? Hormone therapy products are effective for treating symptoms that are associated with menopause, such as hot flashes and night sweats. Hormone replacement carries certain risks, especially as you become older. If you are thinking about using estrogen or estrogen with progestin treatments, discuss the benefits and risks with your health care provider. What should I know about heart disease and stroke? Heart disease, heart attack, and stroke become more likely as you age. This may be due, in part, to the hormonal changes that your body experiences during menopause. These can affect how your body processes dietary fats, triglycerides, and cholesterol. Heart attack and stroke are both medical emergencies. There are many things that you can do to help prevent heart disease and stroke:  Have your blood pressure checked at least every 1-2 years. High blood pressure causes heart disease and increases the risk of stroke.  If you are 11-44 years old, ask your health care provider if you should take aspirin to prevent a heart attack or a stroke.  Do not use any tobacco products, including cigarettes, chewing tobacco, or electronic cigarettes. If you need help quitting, ask your health care provider.  It is important to eat a healthy diet and maintain a healthy weight. ? Be sure to include plenty of vegetables, fruits, low-fat dairy products, and lean protein. ? Avoid eating foods that are high in  solid fats, added sugars, or salt (sodium).  Get regular exercise. This is one of the most important things that you can do for your health. ? Try to exercise for at least 150 minutes each week. The type of exercise that you do should  increase your heart rate and make you sweat. This is known as moderate-intensity exercise. ? Try to do strengthening exercises at least twice each week. Do these in addition to the moderate-intensity exercise.  Know your numbers.Ask your health care provider to check your cholesterol and your blood glucose. Continue to have your blood tested as directed by your health care provider.  What should I know about cancer screening? There are several types of cancer. Take the following steps to reduce your risk and to catch any cancer development as early as possible. Breast Cancer  Practice breast self-awareness. ? This means understanding how your breasts normally appear and feel. ? It also means doing regular breast self-exams. Let your health care provider know about any changes, no matter how small.  If you are 69 or older, have a clinician do a breast exam (clinical breast exam or CBE) every year. Depending on your age, family history, and medical history, it may be recommended that you also have a yearly breast X-ray (mammogram).  If you have a family history of breast cancer, talk with your health care provider about genetic screening.  If you are at high risk for breast cancer, talk with your health care provider about having an MRI and a mammogram every year.  Breast cancer (BRCA) gene test is recommended for women who have family members with BRCA-related cancers. Results of the assessment will determine the need for genetic counseling and BRCA1 and for BRCA2 testing. BRCA-related cancers include these types: ? Breast. This occurs in males or females. ? Ovarian. ? Tubal. This may also be called fallopian tube cancer. ? Cancer of the abdominal or pelvic lining (peritoneal cancer). ? Prostate. ? Pancreatic.  Cervical, Uterine, and Ovarian Cancer Your health care provider may recommend that you be screened regularly for cancer of the pelvic organs. These include your ovaries, uterus,  and vagina. This screening involves a pelvic exam, which includes checking for microscopic changes to the surface of your cervix (Pap test).  For women ages 21-65, health care providers may recommend a pelvic exam and a Pap test every three years. For women ages 18-65, they may recommend the Pap test and pelvic exam, combined with testing for human papilloma virus (HPV), every five years. Some types of HPV increase your risk of cervical cancer. Testing for HPV may also be done on women of any age who have unclear Pap test results.  Other health care providers may not recommend any screening for nonpregnant women who are considered low risk for pelvic cancer and have no symptoms. Ask your health care provider if a screening pelvic exam is right for you.  If you have had past treatment for cervical cancer or a condition that could lead to cancer, you need Pap tests and screening for cancer for at least 20 years after your treatment. If Pap tests have been discontinued for you, your risk factors (such as having a new sexual partner) need to be reassessed to determine if you should start having screenings again. Some women have medical problems that increase the chance of getting cervical cancer. In these cases, your health care provider may recommend that you have screening and Pap tests more often.  If you have  a family history of uterine cancer or ovarian cancer, talk with your health care provider about genetic screening.  If you have vaginal bleeding after reaching menopause, tell your health care provider.  There are currently no reliable tests available to screen for ovarian cancer.  Lung Cancer Lung cancer screening is recommended for adults 56-45 years old who are at high risk for lung cancer because of a history of smoking. A yearly low-dose CT scan of the lungs is recommended if you:  Currently smoke.  Have a history of at least 30 pack-years of smoking and you currently smoke or have quit  within the past 15 years. A pack-year is smoking an average of one pack of cigarettes per day for one year.  Yearly screening should:  Continue until it has been 15 years since you quit.  Stop if you develop a health problem that would prevent you from having lung cancer treatment.  Colorectal Cancer  This type of cancer can be detected and can often be prevented.  Routine colorectal cancer screening usually begins at age 29 and continues through age 58.  If you have risk factors for colon cancer, your health care provider may recommend that you be screened at an earlier age.  If you have a family history of colorectal cancer, talk with your health care provider about genetic screening.  Your health care provider may also recommend using home test kits to check for hidden blood in your stool.  A small camera at the end of a tube can be used to examine your colon directly (sigmoidoscopy or colonoscopy). This is done to check for the earliest forms of colorectal cancer.  Direct examination of the colon should be repeated every 5-10 years until age 5. However, if early forms of precancerous polyps or small growths are found or if you have a family history or genetic risk for colorectal cancer, you may need to be screened more often.  Skin Cancer  Check your skin from [place] to toe regularly.  Monitor any moles. Be sure to tell your health care provider: ? About any new moles or changes in moles, especially if there is a change in a mole's shape or color. ? If you have a mole that is larger than the size of a pencil eraser.  If any of your family members has a history of skin cancer, especially at a young age, talk with your health care provider about genetic screening.  Always use sunscreen. Apply sunscreen liberally and repeatedly throughout the day.  Whenever you are outside, protect yourself by wearing long sleeves, pants, a wide-brimmed hat, and sunglasses.  What should I know  about osteoporosis? Osteoporosis is a condition in which bone destruction happens more quickly than new bone creation. After menopause, you may be at an increased risk for osteoporosis. To help prevent osteoporosis or the bone fractures that can happen because of osteoporosis, the following is recommended:  If you are 36-109 years old, get at least 1,000 mg of calcium and at least 600 mg of vitamin D per day.  If you are older than age 30 but younger than age 22, get at least 1,200 mg of calcium and at least 600 mg of vitamin D per day.  If you are older than age 38, get at least 1,200 mg of calcium and at least 800 mg of vitamin D per day.  Smoking and excessive alcohol intake increase the risk of osteoporosis. Eat foods that are rich in calcium and  vitamin D, and do weight-bearing exercises several times each week as directed by your health care provider. What should I know about how menopause affects my mental health? Depression may occur at any age, but it is more common as you become older. Common symptoms of depression include:  Low or sad mood.  Changes in sleep patterns.  Changes in appetite or eating patterns.  Feeling an overall lack of motivation or enjoyment of activities that you previously enjoyed.  Frequent crying spells.  Talk with your health care provider if you think that you are experiencing depression. What should I know about immunizations? It is important that you get and maintain your immunizations. These include:  Tetanus, diphtheria, and pertussis (Tdap) booster vaccine.  Influenza every year before the flu season begins.  Pneumonia vaccine.  Shingles vaccine.  Your health care provider may also recommend other immunizations. This information is not intended to replace advice given to you by your health care provider. Make sure you discuss any questions you have with your health care provider. Document Released: 12/06/2005 Document Revised: 05/03/2016  Document Reviewed: 07/18/2015 Elsevier Interactive Patient Education  2018 [employer].

## 2017-09-23 ENCOUNTER — Other Ambulatory Visit: Payer: Self-pay | Admitting: Cardiology

## 2017-09-23 NOTE — Telephone Encounter (Signed)
Rx(s) sent to pharmacy electronically.  

## 2017-09-29 ENCOUNTER — Other Ambulatory Visit: Payer: Self-pay | Admitting: Cardiology

## 2017-10-07 ENCOUNTER — Ambulatory Visit: Payer: Medicare Other | Admitting: Family Medicine

## 2017-10-27 ENCOUNTER — Ambulatory Visit: Payer: Medicare Other | Admitting: Family Medicine

## 2017-10-29 ENCOUNTER — Telehealth: Payer: Self-pay

## 2017-10-29 NOTE — Telephone Encounter (Signed)
Called Pt to inform her that the only way to be seen sooner is by seeing another provider. Pt has already declined once today per PEC's notes. During the my call the Pt stated that [employer] "is standing right in front of me call me back". I advised Pt to call once she's done and has time to talk so that I may  Better assist her and possibly get her scheduled today. Pt stated that she would not call back and demanded that I call her back in 5 minutes.

## 2017-10-30 ENCOUNTER — Ambulatory Visit (INDEPENDENT_AMBULATORY_CARE_PROVIDER_SITE_OTHER): Payer: Medicare Other | Admitting: Family Medicine

## 2017-10-30 ENCOUNTER — Encounter: Payer: Self-pay | Admitting: Family Medicine

## 2017-10-30 VITALS — BP 112/82 | HR 94 | Temp 98.0°F | Ht 68.0 in | Wt 239.2 lb

## 2017-10-30 DIAGNOSIS — R05 Cough: Secondary | ICD-10-CM | POA: Diagnosis not present

## 2017-10-30 DIAGNOSIS — R059 Cough, unspecified: Secondary | ICD-10-CM

## 2017-10-30 DIAGNOSIS — J40 Bronchitis, not specified as acute or chronic: Secondary | ICD-10-CM | POA: Diagnosis not present

## 2017-10-30 MED ORDER — DOXYCYCLINE HYCLATE 100 MG PO CAPS: 100.0000 mg | ORAL_CAPSULE | Freq: Two times a day (BID) | ORAL | 0 refills | Status: DC

## 2017-10-30 NOTE — Patient Instructions (Signed)
It was nice to see you today!  We are going to use doxycycline antibiotic for bronchitis for you - take it twice a day for 10 days. Please let me know if you are not feeling better in the next few days- Sooner if worse.

## 2017-10-30 NOTE — Progress Notes (Signed)
[facility] [place], [place],  [address] [phone]  Date:  10/30/2017   Name:  Gina Reilly   DOB:  01-Apr-1924   MRN:  [id number]  PCP:  Mosie Lukes, MD    Chief Complaint: Cough (c/o prod cough and congestion since Christmas. )   History of Present Illness:  Gina Reilly is a 84 y.o. very pleasant female patient who presents with the following:  Pt of Dr. Larose Kells- I have not seen her in the past  She is here today with illness First got sick on 12/25- she was around many family members over the holidays   She has noted a "terrible cough" which is occasionally productive  She is not sleeping well- the cough does not keep her up but she is just restless  She does not cough all the time- off and on She feels that there is chest congestion She has felt feverish off and on but has not measured a fever No new body aches or chills She does have chronic arthritis pain   She suffers from OSA- has not been able to use her CPAP the last few days due to ilness   History of anemia, arthritis, LVH, HTN, hyperlipidemia., a fib and 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 Stanford Breed-  She saw him in late October 1 permanent atrial fibrillation-plan to continue Cardizem for rate control. Continue pradaxa. Check hemoglobin and renal function. 2 Hypertension-blood pressure controlled. Continue present medications. 3 OSA-continue CPAP.  She was on amiodarone in the past but it caused lung toxicity- she had to stop this around 2013. Now is doing well on cardizem for rate control  She raised 8 children (I think there may have been more who passed away but did not ask), and has 14 grands so far.  She is breathing ok today No GI symptoms  No rashes   Patient Active Problem List   Diagnosis Date Noted  . Decreased visual acuity 01/25/2016  .  Medicare annual wellness visit, subsequent 11/27/2014  . Constipation 11/22/2014  . 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 Intermittent claudication ([place]) 05/22/2012  . Cough 05/18/2012  . Chronic diastolic heart failure ([place]) 05/12/2012  . OSA (obstructive sleep apnea) 04/24/2012  . LVH (left ventricular hypertrophy) 04/24/2012  . Hypertension   . Precancerous skin lesion   . Pelvic mass in female   . FATIGUE 01/10/2011  . ATRIAL FIBRILLATION 08/14/2010  . URETHRAL CARUNCLE 04/05/2010  . Osteoarthritis 03/23/2010  . Hyperglycemia 01/10/2010  . GERD 01/10/2010  . Hyperthyroidism 02/09/2009  . Vitamin D deficiency 02/09/2009  . HYPERLIPIDEMIA 02/09/2009  . Obesity 02/09/2009  . Anemia, unspecified 02/09/2009  . DERMATITIS 02/09/2009  . URINARY FREQUENCY 02/09/2009  . UNS ADVRS EFF OTH RX MEDICINAL&BIOLOGICAL SBSTNC 02/09/2009    Past Medical History:  Diagnosis Date  . 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 Anemia   . Apnea, sleep 04/24/2012  . Arthritis    osteo  . Bronchitis 12/25/2011  . Chicken pox as a child  . Chronic atrial fibrillation ([place])    a. 07/2010 TEE/DCCV:  EF 60-65% mild MR.; b. Recurrent Afib--> Amiodarone;  Pradaxa (CHA2DS2VASc = 4);  c. Amio d/c'd 2/2 toxicity - now chronic  rate-controlled afib.  . Constipation 11/22/2014  . Contusion of leg, left 01/16/2012  . GERD (gastroesophageal reflux disease)   . History of echocardiogram    a. 07/2015 Echo: EF 60-65%, Ao sclerosis, mild MR, mildly dil LA/RV/RA, mild Tr, PASP 76mmHg.  Marland Kitchen Hyperglycemia 01/10/2010   Qualifier: Diagnosis of  By: Niel Hummer MD, [place] Hyperlipidemia   . Hypertension   . Hyperthyroidism 02/09/2009   Qualifier: Diagnosis of  By: Niel Hummer MD, [place] Hypoxia 04/14/2012  . Intermittent claudication ([place]) 05/22/2012  . Measles as a child  . Medicare annual  wellness visit, subsequent 11/27/2014   Follows with pulmonology, Dr Gwenette Greet Follow with cardiology, Dr Rance Muir with Dr Matthew Saras Does not participate in [facility], pap or colonoscopy screening at this time.  . Mumps as a child  . Obese   . Ovarian cyst   . Pelvic mass in female   . Peripheral neuropathy 02/26/2012  . Precancerous skin lesion   . Skin cancer    left temple  . Thyroid disease    hypothroidism  . Vitamin D deficiency     Past Surgical History:  Procedure Laterality Date  . ABDOMINAL HYSTERECTOMY    . CATARACT EXTRACTION     Dr. Satira Sark  . SKIN BIOPSY     left temple. cancerous  . TOTAL HIP ARTHROPLASTY  2003   left    Social History   Tobacco Use  . Smoking status: Former Smoker    Packs/day: 0.30    Years: 5.00    Pack years: 1.50    Types: Cigarettes    Last attempt to quit: 10/28/1973    Years since quitting: 44.0  . Smokeless tobacco: Never Used  Substance Use Topics  . Alcohol use: Yes    Comment: socially  . Drug use: No    Family History  Problem Relation Age of Onset  . Stroke Mother   . Prostate cancer Father        prostate  . Hypertension Brother   . Cancer Maternal Grandmother   . Hypertension Brother   . Heart disease Brother   . Obesity Brother   . Hypertension Brother   . Hyperlipidemia Brother   . Heart disease Brother   . Kidney disease Brother   . Cancer Daughter        kidney- spine and blood stream  . Heart disease Son        valvular heart disease  . Prostate cancer Brother     Allergies  Allergen Reactions  . Amiodarone Other (See Comments)    Amiodarone pulmonary toxicity.  . Aspercreme [Trolamine Salicylate] Other (See Comments)    Pt states it caused redness like it was burning her skin.    Medication list has been reviewed and updated.  Current Outpatient Medications on File Prior to Visit  Medication Sig Dispense Refill  . acetaminophen (TYLENOL) 500 MG tablet Take 1,000 mg by mouth daily.     .  Cholecalciferol (VITAMIN D PO) Take 1 capsule by mouth daily.     Marland Kitchen diltiazem (CARDIZEM CD) 360 MG 24 hr capsule TAKE 1 CAPSULE DAILY 90 capsule 2  . Docusate Sodium (PHILLIPS STOOL SOFTENER PO) Take 1 tablet by mouth as needed.     . furosemide (LASIX) 20 MG tablet Take 1 tablet (20 mg total) by mouth as needed. 30 tablet 3  . losartan (COZAAR) 25 MG tablet TAKE 1 TABLET DAILY IN THE EVENING 90  tablet 2  . PRADAXA 150 MG CAPS capsule TAKE 1 CAPSULE TWICE A DAY 180 capsule 2  . Probiotic Product (PROBIOTIC DAILY PO) Take 1 tablet by mouth daily.    . ranitidine (ZANTAC) 150 MG tablet Take 1 tablet (150 mg total) by mouth daily as needed for heartburn. 30 tablet 1   No current facility-administered medications on file prior to visit.     Review of Systems:  As per HPI- otherwise negative.  Normal renal and kidney function on recent labs   Physical Examination: Vitals:   10/30/17 1512  BP: 112/82  Pulse: 94  Temp: 98 F (36.7 C)  SpO2: 98%   Vitals:   10/30/17 1512  Weight: 239 lb 3.2 oz (108.5 kg)  Height: 5\' 8"  (1.727 m)   Body mass index is 36.37 kg/m. Ideal Body Weight: Weight in (lb) to have BMI = 25: 164.1  GEN: WDWN, NAD, Non-toxic, A & O x 3, elderly lady who looks well HEENT: Atraumatic, Normocephalic. Neck supple. No masses, No LAD.  Bilateral TM wnl, oropharynx normal.  PEERL,EOMI.   Ears and Nose: No external deformity. CV: rate controlled a fib, No M/G/R. No JVD. No thrill. No extra heart sounds. PULM: CTA B, no wheezes, crackles, rhonchi. No retractions. No resp. distress. No accessory muscle use. ABD: S, NT, ND, +BS. No rebound. No HSM. EXTR: No c/c/e NEURO Normal gait.  PSYCH: Normally interactive. Conversant. Not depressed or anxious appearing.  Calm demeanor.    Assessment and Plan: Bronchitis - Plan: doxycycline (VIBRAMYCIN) 100 MG capsule  Cough  Treat for bronchitis with doxycycline- she will let me know if not feeling better in the next few  days, Sooner if worse.  She has an appt with Dr. Charlett Blake for a routine follow-up in about 12 days   Signed Lamar Blinks, MD

## 2017-11-11 ENCOUNTER — Ambulatory Visit (INDEPENDENT_AMBULATORY_CARE_PROVIDER_SITE_OTHER): Payer: Medicare Other | Admitting: Family Medicine

## 2017-11-11 ENCOUNTER — Ambulatory Visit (HOSPITAL_BASED_OUTPATIENT_CLINIC_OR_DEPARTMENT_OTHER)
Admission: RE | Admit: 2017-11-11 | Discharge: 2017-11-11 | Disposition: A | Discharge status: Home / Self Care | Payer: Medicare Other | Source: Ambulatory Visit | Attending: Family Medicine | Admitting: Family Medicine

## 2017-11-11 ENCOUNTER — Encounter: Payer: Self-pay | Admitting: Family Medicine

## 2017-11-11 VITALS — BP 137/80 | HR 83 | Temp 97.9°F | Resp 16 | Ht 68.11 in | Wt 235.0 lb

## 2017-11-11 DIAGNOSIS — Z96642 Presence of left artificial hip joint: Secondary | ICD-10-CM | POA: Insufficient documentation

## 2017-11-11 DIAGNOSIS — E785 Hyperlipidemia, unspecified: Secondary | ICD-10-CM

## 2017-11-11 DIAGNOSIS — R2681 Unsteadiness on feet: Secondary | ICD-10-CM

## 2017-11-11 DIAGNOSIS — K219 Gastro-esophageal reflux disease without esophagitis: Secondary | ICD-10-CM

## 2017-11-11 DIAGNOSIS — G8929 Other chronic pain: Secondary | ICD-10-CM

## 2017-11-11 DIAGNOSIS — D649 Anemia, unspecified: Secondary | ICD-10-CM | POA: Diagnosis not present

## 2017-11-11 DIAGNOSIS — M25561 Pain in right knee: Secondary | ICD-10-CM | POA: Diagnosis not present

## 2017-11-11 DIAGNOSIS — M25551 Pain in right hip: Secondary | ICD-10-CM | POA: Insufficient documentation

## 2017-11-11 DIAGNOSIS — L609 Nail disorder, unspecified: Secondary | ICD-10-CM

## 2017-11-11 DIAGNOSIS — I10 Essential (primary) hypertension: Secondary | ICD-10-CM | POA: Diagnosis not present

## 2017-11-11 DIAGNOSIS — E559 Vitamin D deficiency, unspecified: Secondary | ICD-10-CM

## 2017-11-11 DIAGNOSIS — R739 Hyperglycemia, unspecified: Secondary | ICD-10-CM | POA: Diagnosis not present

## 2017-11-11 DIAGNOSIS — I482 Chronic atrial fibrillation, unspecified: Secondary | ICD-10-CM

## 2017-11-11 DIAGNOSIS — R059 Cough, unspecified: Secondary | ICD-10-CM

## 2017-11-11 DIAGNOSIS — I700 Atherosclerosis of aorta: Secondary | ICD-10-CM | POA: Diagnosis not present

## 2017-11-11 DIAGNOSIS — M179 Osteoarthritis of knee, unspecified: Secondary | ICD-10-CM | POA: Diagnosis not present

## 2017-11-11 DIAGNOSIS — R05 Cough: Secondary | ICD-10-CM | POA: Diagnosis not present

## 2017-11-11 DIAGNOSIS — R0602 Shortness of breath: Secondary | ICD-10-CM | POA: Diagnosis not present

## 2017-11-11 DIAGNOSIS — M1611 Unilateral primary osteoarthritis, right hip: Secondary | ICD-10-CM | POA: Diagnosis not present

## 2017-11-11 NOTE — Assessment & Plan Note (Signed)
Has been having some flares with recent illness

## 2017-11-11 NOTE — Assessment & Plan Note (Signed)
hgba1c acceptable, minimize simple carbs. Increase exercise as tolerated.  

## 2017-11-11 NOTE — Assessment & Plan Note (Signed)
Avoid offending foods, start probiotics. Do not eat large meals in late evening and consider raising [place] of bed.  

## 2017-11-11 NOTE — Progress Notes (Signed)
Subjective:  I acted as a Education administrator for [employer]. Gina Reilly, Gina Reilly   Patient ID: Gina Reilly, female    DOB: 08/31/1924, 84 y.o.   MRN: [id number]  Chief Complaint  Patient presents with  . Follow-up    HPI  Patient is in today for follow up visit and is most frustrated with her decreasing ability to ambulate. She has notable right hip and right knee pain which limit her mobility and make her feel unstable. No recent falls and she is using 2 canes. No recent febrile illness or hospitalizations. Denies CP/palp/SOB/HA/congestion/fevers/GI or GU c/o. Taking meds as prescribed  Patient Care Team: Mosie Lukes, MD as PCP - General (Family Medicine) Ralene Bathe, MD as Consulting Physician (Ophthalmology) Stanford Breed Denice Bors, MD as Consulting Physician (Cardiology)   Past Medical History:  Diagnosis Date  . 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 Anemia   . Apnea, sleep 04/24/2012  . Arthritis    osteo  . Bronchitis 12/25/2011  . Chicken pox as a child  . Chronic atrial fibrillation ([place])    a. 07/2010 TEE/DCCV:  EF 60-65% mild MR.; b. Recurrent Afib--> Amiodarone;  Pradaxa (CHA2DS2VASc = 4);  c. Amio d/c'd 2/2 toxicity - now chronic rate-controlled afib.  . Constipation 11/22/2014  . Contusion of leg, left 01/16/2012  . GERD (gastroesophageal reflux disease)   . History of echocardiogram    a. 07/2015 Echo: EF 60-65%, Ao sclerosis, mild MR, mildly dil LA/RV/RA, mild Tr, PASP 1mmHg.  Marland Kitchen Hyperglycemia 01/10/2010   Qualifier: Diagnosis of  By: Niel Hummer MD, [place] Hyperlipidemia   . Hypertension   . Hyperthyroidism 02/09/2009   Qualifier: Diagnosis of  By: Niel Hummer MD, [place] Hypoxia 04/14/2012  . Intermittent claudication ([place]) 05/22/2012  . Measles as a child  . Medicare annual wellness visit, subsequent 11/27/2014   Follows with pulmonology, Dr Gwenette Greet  Follow with cardiology, Dr Rance Muir with Dr Matthew Saras Does not participate in [facility], pap or colonoscopy screening at this time.  . Mumps as a child  . Obese   . Ovarian cyst   . Pelvic mass in female   . Peripheral neuropathy 02/26/2012  . Precancerous skin lesion   . Skin cancer    left temple  . Thyroid disease    hypothroidism  . Vitamin D deficiency     Past Surgical History:  Procedure Laterality Date  . ABDOMINAL HYSTERECTOMY    . CATARACT EXTRACTION     Dr. Satira Sark  . SKIN BIOPSY     left temple. cancerous  . TOTAL HIP ARTHROPLASTY  2003   left    Family History  Problem Relation Age of Onset  . Stroke Mother   . Prostate cancer Father        prostate  . Hypertension Brother   . Cancer Maternal Grandmother   . Hypertension Brother   . Heart disease Brother   . Obesity Brother   . Hypertension Brother   . Hyperlipidemia Brother   . Heart disease Brother   . Kidney disease Brother   . Cancer Daughter        kidney- spine and blood stream  . Heart disease Son        valvular heart disease  . Prostate cancer Brother     Social History   Socioeconomic History  .  Marital status: Widowed    Spouse name: Not on file  . Number of children: 73  . Years of education: Not on file  . Highest education level: Not on file  Social Needs  . Financial resource strain: Not on file  . Food insecurity - worry: Not on file  . Food insecurity - inability: Not on file  . Transportation needs - medical: Not on file  . Transportation needs - non-medical: Not on file  Occupational History  . Occupation: retired    Fish farm manager: RETIRED  Tobacco Use  . Smoking status: Former Smoker    Packs/day: 0.30    Years: 5.00    Pack years: 1.50    Types: Cigarettes    Last attempt to quit: 10/28/1973    Years since quitting: 44.0  . Smokeless tobacco: Never Used  Substance and Sexual Activity  . Alcohol use: Yes    Comment: socially  . Drug use: No  . Sexual activity: Not on  file  Other Topics Concern  . Not on file  Social History Narrative  . Not on file    Outpatient Medications Prior to Visit  Medication Sig Dispense Refill  . acetaminophen (TYLENOL) 500 MG tablet Take 1,000 mg by mouth daily.     . Cholecalciferol (VITAMIN D PO) Take 1 capsule by mouth daily.     Marland Kitchen diltiazem (CARDIZEM CD) 360 MG 24 hr capsule TAKE 1 CAPSULE DAILY 90 capsule 2  . Docusate Sodium (PHILLIPS STOOL SOFTENER PO) Take 1 tablet by mouth as needed.     . doxycycline (VIBRAMYCIN) 100 MG capsule Take 1 capsule (100 mg total) by mouth 2 (two) times daily. 20 capsule 0  . furosemide (LASIX) 20 MG tablet Take 1 tablet (20 mg total) by mouth as needed. 30 tablet 3  . losartan (COZAAR) 25 MG tablet TAKE 1 TABLET DAILY IN THE EVENING 90 tablet 2  . PRADAXA 150 MG CAPS capsule TAKE 1 CAPSULE TWICE A DAY 180 capsule 2  . Probiotic Product (PROBIOTIC DAILY PO) Take 1 tablet by mouth daily.    . ranitidine (ZANTAC) 150 MG tablet Take 1 tablet (150 mg total) by mouth daily as needed for heartburn. 30 tablet 1   No facility-administered medications prior to visit.     Allergies  Allergen Reactions  . Amiodarone Other (See Comments)    Amiodarone pulmonary toxicity.  . Aspercreme [Trolamine Salicylate] Other (See Comments)    Pt states it caused redness like it was burning her skin.    Review of Systems  Constitutional: Negative for fever and malaise/fatigue.  HENT: Negative for congestion.   Eyes: Negative for blurred vision.  Respiratory: Negative for shortness of breath.   Cardiovascular: Negative for chest pain, palpitations and leg swelling.  Gastrointestinal: Negative for abdominal pain, blood in stool and nausea.  Genitourinary: Negative for dysuria and frequency.  Musculoskeletal: Negative for falls.  Skin: Negative for rash.  Neurological: Negative for dizziness, loss of consciousness and headaches.  Endo/Heme/Allergies: Negative for environmental allergies.    Psychiatric/Behavioral: Negative for depression. The patient is not nervous/anxious.        Objective:    Physical Exam  Constitutional: She is oriented to person, place, and time. She appears well-developed and well-nourished. No distress.  HENT:  [place]: Normocephalic and atraumatic.  Nose: Nose normal.  Eyes: Right eye exhibits no discharge. Left eye exhibits no discharge.  Neck: Normal range of motion. Neck supple.  Cardiovascular: Normal rate.  Murmur heard. Pulmonary/Chest: Effort  normal and breath sounds normal.  Abdominal: Soft. Bowel sounds are normal. There is no tenderness.  Musculoskeletal: She exhibits no edema.  Neurological: She is alert and oriented to person, place, and time.  Skin: Skin is warm and dry.  Psychiatric: She has a normal mood and affect.  Nursing note and vitals reviewed.   BP 137/80 (BP Location: Left Arm, Patient Position: Sitting, Cuff Size: Large)   Pulse 83   Temp 97.9 F (36.6 C) (Oral)   Resp 16   Ht 5' 8.11" (1.73 m)   Wt 235 lb (106.6 kg)   LMP 12/25/2011   SpO2 98%   BMI 35.62 kg/m  Wt Readings from Last 3 Encounters:  11/11/17 235 lb (106.6 kg)  10/30/17 239 lb 3.2 oz (108.5 kg)  09/09/17 234 lb (106.1 kg)   BP Readings from Last 3 Encounters:  11/11/17 137/80  10/30/17 112/82  09/09/17 (!) 143/80     Immunization History  Administered Date(s) Administered  . Pneumococcal Polysaccharide-23 01/25/2016    Health Maintenance  Topic Date Due  . Samul Dada  12/23/1942  . INFLUENZA VACCINE  02/02/2018 (Originally 05/28/2017)  . DEXA SCAN  03/02/2018 (Originally 12/23/1990)  . PNA vac Low Risk Adult (2 of 2 - PCV13) 09/09/2018 (Originally 01/24/2017)    Lab Results  Component Value Date   WBC 9.4 11/11/2017   HGB 14.3 11/11/2017   HCT 44.8 11/11/2017   PLT 259.0 11/11/2017   GLUCOSE 107 (H) 11/11/2017   CHOL 160 11/11/2017   TRIG 129.0 11/11/2017   HDL 64.60 11/11/2017   LDLDIRECT 128.4 02/09/2009   LDLCALC 70  11/11/2017   ALT 10 11/11/2017   AST 15 11/11/2017   NA 139 11/11/2017   K 5.9 (H) 11/11/2017   CL 104 11/11/2017   CREATININE 0.82 11/11/2017   BUN 18 11/11/2017   CO2 27 11/11/2017   TSH 3.73 11/11/2017   INR 1.32 08/17/2010   HGBA1C 6.3 11/11/2017    Lab Results  Component Value Date   TSH 3.73 11/11/2017   Lab Results  Component Value Date   WBC 9.4 11/11/2017   HGB 14.3 11/11/2017   HCT 44.8 11/11/2017   MCV 100.2 (H) 11/11/2017   PLT 259.0 11/11/2017   Lab Results  Component Value Date   NA 139 11/11/2017   K 5.9 (H) 11/11/2017   CO2 27 11/11/2017   GLUCOSE 107 (H) 11/11/2017   BUN 18 11/11/2017   CREATININE 0.82 11/11/2017   BILITOT 0.7 11/11/2017   ALKPHOS 68 11/11/2017   AST 15 11/11/2017   ALT 10 11/11/2017   PROT 7.1 11/11/2017   ALBUMIN 4.0 11/11/2017   CALCIUM 9.8 11/11/2017   GFR 69.32 11/11/2017   Lab Results  Component Value Date   CHOL 160 11/11/2017   Lab Results  Component Value Date   HDL 64.60 11/11/2017   Lab Results  Component Value Date   LDLCALC 70 11/11/2017   Lab Results  Component Value Date   TRIG 129.0 11/11/2017   Lab Results  Component Value Date   CHOLHDL 2 11/11/2017   Lab Results  Component Value Date   HGBA1C 6.3 11/11/2017         Assessment & Plan:   Problem List Items Addressed This Visit    Hyperglycemia    hgba1c acceptable, minimize simple carbs. Increase exercise as tolerated.       Relevant Orders   Hemoglobin A1c (Completed)   Comprehensive metabolic panel (Completed)   Vitamin D deficiency -  Primary   Relevant Orders   VITAMIN D 25 Hydroxy (Vit-D Deficiency, Fractures) (Completed)   Hyperlipidemia    Encouraged heart healthy diet, increase exercise, avoid trans fats, consider a krill oil cap daily      Relevant Orders   Lipid panel (Completed)   Anemia, unspecified    Increase leafy greens, consider increased lean red meat and using cast iron cookware. Continue to monitor, report  any concerns      Relevant Orders   CBC (Completed)   ATRIAL FIBRILLATION    Has been having some flares with recent illness      GERD    Avoid offending foods, start probiotics. Do not eat large meals in late evening and consider raising [place] of bed.       Relevant Orders   Magnesium (Completed)   Zinc   Hypertension    Well controlled, no changes to meds. Encouraged heart healthy diet such as the DASH diet and exercise as tolerated.       Relevant Orders   TSH (Completed)   Magnesium (Completed)   Zinc   Cough   Relevant Orders   DG Chest 2 View (Completed)   Fingernail abnormalities    Check labs today      Relevant Orders   Magnesium (Completed)   Zinc   Pain of right hip joint   Relevant Orders   DG HIP UNILAT W OR W/O PELVIS 2-3 VIEWS RIGHT (Completed)   Ambulatory referral to [place] gait    With right hip and right knee pain, xrays confirm significant arthritis. She is not a surgical candidate but she does agree to a home health referral for PT evaluation to consider which assistive devices might be the most helpful and to do a safety check.       Relevant Orders   Ambulatory referral to [place]    Other Visit Diagnoses    Chronic pain of right knee       Relevant Orders   DG Knee Complete 4 Views Right (Completed)      I am having Dorianna N. Vellucci maintain her Probiotic Product (PROBIOTIC DAILY PO), furosemide, ranitidine, Cholecalciferol (VITAMIN D PO), Docusate Sodium (PHILLIPS STOOL SOFTENER PO), acetaminophen, PRADAXA, diltiazem, losartan, and doxycycline.  No orders of the defined types were placed in this encounter.   CMA served as Education administrator during this visit. History, Physical and Plan performed by medical provider. Documentation and orders reviewed and attested to.  Penni Homans, MD

## 2017-11-11 NOTE — Assessment & Plan Note (Signed)
Check labs today.

## 2017-11-11 NOTE — Assessment & Plan Note (Signed)
Encouraged heart healthy diet, increase exercise, avoid trans fats, consider a krill oil cap daily 

## 2017-11-11 NOTE — Assessment & Plan Note (Signed)
Well controlled, no changes to meds. Encouraged heart healthy diet such as the DASH diet and exercise as tolerated.  °

## 2017-11-11 NOTE — Assessment & Plan Note (Signed)
Increase leafy greens, consider increased lean red meat and using cast iron cookware. Continue to monitor, report any concerns 

## 2017-11-11 NOTE — Patient Instructions (Signed)
Plain Mucinex twice daily x 2 weeks Hydrate well Lidocaine gel to knee or hip daily for severe pain, Aspercreme, Icy Hot and Salon Pas make lidocaine options  Tylenol can be increased to 1000 mg twice daily (max of 3000 mg/24 hours)  Encouraged increased hydration and fiber in diet. Daily probiotics. If bowels not moving can use MOM 2 tbls po in 4 oz of warm prune juice by mouth every 2-3 days. If no results then repeat in 4 hours with  Dulcolax suppository pr, may repeat again in 4 more hours as needed. Seek care if symptoms worsen. Consider daily Miralax and/or Dulcolax if symptoms persist.   Miralax mixed Benefiber once or twice daily Hip Pain The hip is the joint between the upper legs and the lower pelvis. The bones, cartilage, tendons, and muscles of your hip joint support your body and allow you to move around. Hip pain can range from a minor ache to severe pain in one or both of your hips. The pain may be felt on the inside of the hip joint near the groin, or the outside near the buttocks and upper thigh. You may also have swelling or stiffness. Follow these instructions at home: Managing pain, stiffness, and swelling  If directed, apply ice to the injured area. ? Put ice in a plastic bag. ? Place a towel between your skin and the bag. ? Leave the ice on for 20 minutes, 2-3 times a day  Sleep with a pillow between your legs on your most comfortable side.  Avoid any activities that cause pain. General instructions  Take over-the-counter and prescription medicines only as told by your health care provider.  Do any exercises as told by your health care provider.  Record the following: ? How often you have hip pain. ? The location of your pain. ? What the pain feels like. ? What makes the pain worse.  Keep all follow-up visits as told by your health care provider. This is important. Contact a health care provider if:  You cannot put weight on your leg.  Your pain or  swelling continues or gets worse after one week.  It gets harder to walk.  You have a fever. Get help right away if:  You fall.  You have a sudden increase in pain and swelling in your hip.  Your hip is red or swollen or very tender to touch. Summary  Hip pain can range from a minor ache to severe pain in one or both of your hips.  The pain may be felt on the inside of the hip joint near the groin, or the outside near the buttocks and upper thigh.  Avoid any activities that cause pain.  Record how often you have hip pain, the location of the pain, what makes it worse and what it feels like. This information is not intended to replace advice given to you by your health care provider. Make sure you discuss any questions you have with your health care provider. Document Released: 04/03/2010 Document Revised: 09/16/2016 Document Reviewed: 09/16/2016 Elsevier Interactive Patient Education  [employer].

## 2017-11-12 DIAGNOSIS — R2681 Unsteadiness on feet: Secondary | ICD-10-CM | POA: Insufficient documentation

## 2017-11-12 DIAGNOSIS — M25551 Pain in right hip: Secondary | ICD-10-CM | POA: Insufficient documentation

## 2017-11-12 LAB — COMPREHENSIVE METABOLIC PANEL
ALK PHOS: 68 U/L (ref 39–117)
ALT: 10 U/L (ref 0–35)
AST: 15 U/L (ref 0–37)
Albumin: 4 g/dL (ref 3.5–5.2)
BUN: 18 mg/dL (ref 6–23)
CO2: 27 meq/L (ref 19–32)
Calcium: 9.8 mg/dL (ref 8.4–10.5)
Chloride: 104 mEq/L (ref 96–112)
Creatinine, Ser: 0.82 mg/dL (ref 0.40–1.20)
GFR: 69.32 mL/min (ref >60.00)
GLUCOSE: 107 mg/dL — AB (ref 70–99)
Potassium: 5.9 mEq/L — ABNORMAL HIGH (ref 3.5–5.1)
SODIUM: 139 meq/L (ref 135–145)
TOTAL PROTEIN: 7.1 g/dL (ref 6.0–8.3)
Total Bilirubin: 0.7 mg/dL (ref 0.2–1.2)

## 2017-11-12 LAB — CBC
HCT: 44.8 % (ref 36.0–46.0)
HEMOGLOBIN: 14.3 g/dL (ref 12.0–15.0)
MCHC: 32 g/dL (ref 30.0–36.0)
MCV: 100.2 fl — ABNORMAL HIGH (ref 78.0–100.0)
Platelets: 259 10*3/uL (ref 150.0–400.0)
RBC: 4.47 Mil/uL (ref 3.87–5.11)
RDW: 13.3 % (ref 11.5–15.5)
WBC: 9.4 10*3/uL (ref 4.0–10.5)

## 2017-11-12 LAB — LIPID PANEL
CHOL/HDL RATIO: 2
Cholesterol: 160 mg/dL (ref 0–200)
HDL: 64.6 mg/dL (ref >39.00)
LDL Cholesterol: 70 mg/dL (ref 0–99)
NONHDL: 95.58
Triglycerides: 129 mg/dL (ref 0.0–149.0)
VLDL: 25.8 mg/dL (ref 0.0–40.0)

## 2017-11-12 LAB — HEMOGLOBIN A1C: HEMOGLOBIN A1C: 6.3 % (ref 4.6–6.5)

## 2017-11-12 LAB — TSH: TSH: 3.73 u[IU]/mL (ref 0.35–4.50)

## 2017-11-12 LAB — MAGNESIUM: Magnesium: 2.1 mg/dL (ref 1.5–2.5)

## 2017-11-12 LAB — VITAMIN D 25 HYDROXY (VIT D DEFICIENCY, FRACTURES): VITD: 25.91 ng/mL — ABNORMAL LOW (ref 30.00–100.00)

## 2017-11-12 NOTE — Assessment & Plan Note (Signed)
With right hip and right knee pain, xrays confirm significant arthritis. She is not a surgical candidate but she does agree to a home health referral for PT evaluation to consider which assistive devices might be the most helpful and to do a safety check.

## 2017-11-13 LAB — ZINC: ZINC: 62 ug/dL (ref 60–130)

## 2017-11-17 DIAGNOSIS — I482 Chronic atrial fibrillation: Secondary | ICD-10-CM | POA: Diagnosis not present

## 2017-11-17 DIAGNOSIS — I10 Essential (primary) hypertension: Secondary | ICD-10-CM | POA: Diagnosis not present

## 2017-11-17 DIAGNOSIS — M1611 Unilateral primary osteoarthritis, right hip: Secondary | ICD-10-CM | POA: Diagnosis not present

## 2017-11-17 DIAGNOSIS — M1711 Unilateral primary osteoarthritis, right knee: Secondary | ICD-10-CM | POA: Diagnosis not present

## 2017-11-17 DIAGNOSIS — Z96642 Presence of left artificial hip joint: Secondary | ICD-10-CM | POA: Diagnosis not present

## 2017-11-17 DIAGNOSIS — D649 Anemia, unspecified: Secondary | ICD-10-CM | POA: Diagnosis not present

## 2017-11-18 ENCOUNTER — Telehealth: Payer: Self-pay | Admitting: Family Medicine

## 2017-11-18 NOTE — Telephone Encounter (Signed)
Verbal orders  

## 2017-11-18 NOTE — Telephone Encounter (Signed)
Copied Reilly Roby. Topic: Quick Communication - See Telephone Encounter >> Nov 18, 2017 10:54 AM Aurelio Brash B wrote: CRM for notification. See Telephone encounter for:  Gina Reilly Nanine Means is asking for verbal orders for Home health PT  - 2 x 3,  then 1 x 1   her contact number is [phone] 11/18/17.

## 2017-11-18 NOTE — Telephone Encounter (Signed)
Thanks

## 2017-11-20 DIAGNOSIS — I10 Essential (primary) hypertension: Secondary | ICD-10-CM | POA: Diagnosis not present

## 2017-11-20 DIAGNOSIS — Z96642 Presence of left artificial hip joint: Secondary | ICD-10-CM | POA: Diagnosis not present

## 2017-11-20 DIAGNOSIS — M1711 Unilateral primary osteoarthritis, right knee: Secondary | ICD-10-CM | POA: Diagnosis not present

## 2017-11-20 DIAGNOSIS — I482 Chronic atrial fibrillation: Secondary | ICD-10-CM | POA: Diagnosis not present

## 2017-11-20 DIAGNOSIS — M1611 Unilateral primary osteoarthritis, right hip: Secondary | ICD-10-CM | POA: Diagnosis not present

## 2017-11-20 DIAGNOSIS — D649 Anemia, unspecified: Secondary | ICD-10-CM | POA: Diagnosis not present

## 2017-11-24 DIAGNOSIS — M1611 Unilateral primary osteoarthritis, right hip: Secondary | ICD-10-CM | POA: Diagnosis not present

## 2017-11-24 DIAGNOSIS — Z96642 Presence of left artificial hip joint: Secondary | ICD-10-CM | POA: Diagnosis not present

## 2017-11-24 DIAGNOSIS — I482 Chronic atrial fibrillation: Secondary | ICD-10-CM | POA: Diagnosis not present

## 2017-11-24 DIAGNOSIS — M1711 Unilateral primary osteoarthritis, right knee: Secondary | ICD-10-CM | POA: Diagnosis not present

## 2017-11-24 DIAGNOSIS — D649 Anemia, unspecified: Secondary | ICD-10-CM | POA: Diagnosis not present

## 2017-11-24 DIAGNOSIS — I10 Essential (primary) hypertension: Secondary | ICD-10-CM | POA: Diagnosis not present

## 2017-11-25 ENCOUNTER — Telehealth: Payer: Self-pay | Admitting: Family Medicine

## 2017-11-25 NOTE — Telephone Encounter (Signed)
Copied from [place] [phone]. Topic: Quick Communication - Lab Results >> Nov 25, 2017  1:00 PM Leandrea Ackley, [place], [place] wrote: Patient calling because she would like to have her lad results. If someone could give her a call at [phone]

## 2017-11-25 NOTE — Telephone Encounter (Signed)
Copied from [place] [phone]. Topic: Quick Communication - Lab Results >> Nov 25, 2017  1:00 PM Kushal Saunders, [place], [place] wrote: Patient calling because she would like to have her lad results. If someone could give her a call at [phone]

## 2017-11-25 NOTE — Telephone Encounter (Signed)
Patient notified

## 2017-11-27 DIAGNOSIS — Z96642 Presence of left artificial hip joint: Secondary | ICD-10-CM | POA: Diagnosis not present

## 2017-11-27 DIAGNOSIS — D649 Anemia, unspecified: Secondary | ICD-10-CM | POA: Diagnosis not present

## 2017-11-27 DIAGNOSIS — I10 Essential (primary) hypertension: Secondary | ICD-10-CM | POA: Diagnosis not present

## 2017-11-27 DIAGNOSIS — M1611 Unilateral primary osteoarthritis, right hip: Secondary | ICD-10-CM | POA: Diagnosis not present

## 2017-11-27 DIAGNOSIS — M1711 Unilateral primary osteoarthritis, right knee: Secondary | ICD-10-CM | POA: Diagnosis not present

## 2017-11-27 DIAGNOSIS — I482 Chronic atrial fibrillation: Secondary | ICD-10-CM | POA: Diagnosis not present

## 2017-12-02 DIAGNOSIS — D649 Anemia, unspecified: Secondary | ICD-10-CM | POA: Diagnosis not present

## 2017-12-02 DIAGNOSIS — M1611 Unilateral primary osteoarthritis, right hip: Secondary | ICD-10-CM | POA: Diagnosis not present

## 2017-12-02 DIAGNOSIS — I482 Chronic atrial fibrillation: Secondary | ICD-10-CM | POA: Diagnosis not present

## 2017-12-02 DIAGNOSIS — Z96642 Presence of left artificial hip joint: Secondary | ICD-10-CM | POA: Diagnosis not present

## 2017-12-02 DIAGNOSIS — M1711 Unilateral primary osteoarthritis, right knee: Secondary | ICD-10-CM | POA: Diagnosis not present

## 2017-12-02 DIAGNOSIS — I10 Essential (primary) hypertension: Secondary | ICD-10-CM | POA: Diagnosis not present

## 2017-12-04 DIAGNOSIS — D649 Anemia, unspecified: Secondary | ICD-10-CM | POA: Diagnosis not present

## 2017-12-04 DIAGNOSIS — M1711 Unilateral primary osteoarthritis, right knee: Secondary | ICD-10-CM | POA: Diagnosis not present

## 2017-12-04 DIAGNOSIS — I10 Essential (primary) hypertension: Secondary | ICD-10-CM | POA: Diagnosis not present

## 2017-12-04 DIAGNOSIS — M1611 Unilateral primary osteoarthritis, right hip: Secondary | ICD-10-CM | POA: Diagnosis not present

## 2017-12-04 DIAGNOSIS — Z96642 Presence of left artificial hip joint: Secondary | ICD-10-CM | POA: Diagnosis not present

## 2017-12-04 DIAGNOSIS — I482 Chronic atrial fibrillation: Secondary | ICD-10-CM | POA: Diagnosis not present

## 2017-12-09 DIAGNOSIS — M1711 Unilateral primary osteoarthritis, right knee: Secondary | ICD-10-CM | POA: Diagnosis not present

## 2017-12-09 DIAGNOSIS — D649 Anemia, unspecified: Secondary | ICD-10-CM | POA: Diagnosis not present

## 2017-12-09 DIAGNOSIS — Z96642 Presence of left artificial hip joint: Secondary | ICD-10-CM | POA: Diagnosis not present

## 2017-12-09 DIAGNOSIS — M1611 Unilateral primary osteoarthritis, right hip: Secondary | ICD-10-CM | POA: Diagnosis not present

## 2017-12-09 DIAGNOSIS — I482 Chronic atrial fibrillation: Secondary | ICD-10-CM | POA: Diagnosis not present

## 2017-12-09 DIAGNOSIS — I10 Essential (primary) hypertension: Secondary | ICD-10-CM | POA: Diagnosis not present

## 2017-12-10 ENCOUNTER — Telehealth: Payer: Self-pay | Admitting: *Deleted

## 2017-12-10 NOTE — Telephone Encounter (Signed)
Received Physician Orders/Home Health Certification [I6E] from [facility]; forwarded to provider with OV notes/SLS 02/13

## 2017-12-10 NOTE — Telephone Encounter (Signed)
Received Home Health Certification and Plan of Care; forwarded to provider/SLS 02/13  

## 2018-01-02 ENCOUNTER — Ambulatory Visit: Payer: Medicare Other | Admitting: Adult Health

## 2018-01-06 ENCOUNTER — Telehealth: Payer: Self-pay | Admitting: Cardiology

## 2018-01-06 DIAGNOSIS — H524 Presbyopia: Secondary | ICD-10-CM | POA: Diagnosis not present

## 2018-01-06 DIAGNOSIS — Z961 Presence of intraocular lens: Secondary | ICD-10-CM | POA: Diagnosis not present

## 2018-01-06 NOTE — Telephone Encounter (Signed)
Pt c/o medication issue: 1. Name of Medication: Losartan Potassium 2. How are you currently taking this medication (dosage and times per day)? 3. Are you having a reaction (difficulty breathing--STAT)?   4. What is your medication issue?

## 2018-01-06 NOTE — Telephone Encounter (Signed)
Returned call to patient. She states she received a letter form [employer] stating her losartan may be on recall and she should contact her prescribing provider. Explained that some losartan has been recall however NOT all manufacturers and not all lot #s. Advised that she call her dispensing pharmacy and have them determine if her Rx was on recall and call back if so, so a change can be made

## 2018-01-11 ENCOUNTER — Encounter: Payer: Self-pay | Admitting: Adult Health

## 2018-01-12 ENCOUNTER — Encounter: Payer: Self-pay | Admitting: Adult Health

## 2018-01-12 ENCOUNTER — Ambulatory Visit (INDEPENDENT_AMBULATORY_CARE_PROVIDER_SITE_OTHER): Payer: Medicare Other | Admitting: Adult Health

## 2018-01-12 DIAGNOSIS — G4733 Obstructive sleep apnea (adult) (pediatric): Secondary | ICD-10-CM | POA: Diagnosis not present

## 2018-01-12 DIAGNOSIS — E669 Obesity, unspecified: Secondary | ICD-10-CM | POA: Diagnosis not present

## 2018-01-12 NOTE — Patient Instructions (Addendum)
Continue on CPAP At bedtime   Keep up good work .  Work on healthy weight .  Do not drive if sleepy.  Follow up with Dr. Sood  In 1 year and As needed    

## 2018-01-12 NOTE — Progress Notes (Signed)
@Patient  ID: Gina Reilly, female    DOB: 1924-08-21, 84 y.o.   MRN: [id number]  Chief Complaint  Patient presents with  . Follow-up    OSA     Referring provider: Mosie Lukes, MD  HPI: 84 year old female followed for sleep apnea   01/12/2018 Follow up : OSA  Patient presents for a one year follow-up.  Patient has underlying sleep apnea.  She is on CPAP.  Patient says she is doing well on CPAP.  She feels rested.  No significant daytime sleepiness. Download shows excellent compliance with average usage at 6.5 hours.  Patient is on CPAP 8 CMH20   AHI 1.9.  Patient feels that she benefits from CPAP   Allergies  Allergen Reactions  . Amiodarone Other (See Comments)    Amiodarone pulmonary toxicity.  . Aspercreme [Trolamine Salicylate] Other (See Comments)    Pt states it caused redness like it was burning her skin.    Immunization History  Administered Date(s) Administered  . Pneumococcal Polysaccharide-23 01/25/2016    Past Medical History:  Diagnosis Date  . 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 Anemia   . Apnea, sleep 04/24/2012  . Arthritis    osteo  . Bronchitis 12/25/2011  . Chicken pox as a child  . Chronic atrial fibrillation ([place])    a. 07/2010 TEE/DCCV:  EF 60-65% mild MR.; b. Recurrent Afib--> Amiodarone;  Pradaxa (CHA2DS2VASc = 4);  c. Amio d/c'd 2/2 toxicity - now chronic rate-controlled afib.  . Constipation 11/22/2014  . Contusion of leg, left 01/16/2012  . GERD (gastroesophageal reflux disease)   . History of echocardiogram    a. 07/2015 Echo: EF 60-65%, Ao sclerosis, mild MR, mildly dil LA/RV/RA, mild Tr, PASP 51mmHg.  Marland Kitchen Hyperglycemia 01/10/2010   Qualifier: Diagnosis of  By: Niel Hummer MD, [place] Hyperlipidemia   . Hypertension   . Hyperthyroidism 02/09/2009   Qualifier: Diagnosis of  By: Niel Hummer MD, [place] Hypoxia  04/14/2012  . Intermittent claudication ([place]) 05/22/2012  . Measles as a child  . Medicare annual wellness visit, subsequent 11/27/2014   Follows with pulmonology, Dr Gwenette Greet Follow with cardiology, Dr Rance Muir with Dr Matthew Saras Does not participate in [facility], pap or colonoscopy screening at this time.  . Mumps as a child  . Obese   . Ovarian cyst   . Pelvic mass in female   . Peripheral neuropathy 02/26/2012  . Precancerous skin lesion   . Skin cancer    left temple  . Thyroid disease    hypothroidism  . Vitamin D deficiency     Tobacco History: Social History   Tobacco Use  Smoking Status Former Smoker  . Packs/day: 0.30  . Years: 5.00  . Pack years: 1.50  . Types: Cigarettes  . Last attempt to quit: 10/28/1973  . Years since quitting: 44.2  Smokeless Tobacco Never Used   Counseling given: Not Answered   Outpatient Encounter Medications as of 01/12/2018  Medication Sig  . acetaminophen (TYLENOL) 500 MG tablet Take 2,000 mg by mouth daily.   . Cholecalciferol (VITAMIN D PO) Take 1 capsule by mouth daily.   Marland Kitchen diltiazem (CARDIZEM CD) 360 MG 24 hr capsule TAKE 1 CAPSULE DAILY  . Docusate Sodium (PHILLIPS STOOL SOFTENER PO) Take 1 tablet by mouth as needed.   . furosemide (LASIX)  20 MG tablet Take 1 tablet (20 mg total) by mouth as needed.  Marland Kitchen losartan (COZAAR) 25 MG tablet TAKE 1 TABLET DAILY IN THE EVENING  . PRADAXA 150 MG CAPS capsule TAKE 1 CAPSULE TWICE A DAY  . Probiotic Product (PROBIOTIC DAILY PO) Take 1 tablet by mouth daily.  . ranitidine (ZANTAC) 150 MG tablet Take 1 tablet (150 mg total) by mouth daily as needed for heartburn.  . [DISCONTINUED] doxycycline (VIBRAMYCIN) 100 MG capsule Take 1 capsule (100 mg total) by mouth 2 (two) times daily.   No facility-administered encounter medications on file as of 01/12/2018.      Review of Systems  Constitutional:   No  weight loss, night sweats,  Fevers, chills, fatigue, or  lassitude.  HEENT:   No headaches,   Difficulty swallowing,  Tooth/dental problems, or  Sore throat,                No sneezing, itching, ear ache, nasal congestion, post nasal drip,   CV:  No chest pain,  Orthopnea, PND, swelling in lower extremities, anasarca, dizziness, palpitations, syncope.   GI  No heartburn, indigestion, abdominal pain, nausea, vomiting, diarrhea, change in bowel habits, loss of appetite, bloody stools.   Resp: No shortness of breath with exertion or at rest.  No excess mucus, no productive cough,  No non-productive cough,  No coughing up of blood.  No change in color of mucus.  No wheezing.  No chest wall deformity  Skin: no rash or lesions.  GU: no dysuria, change in color of urine, no urgency or frequency.  No flank pain, no hematuria   MS:  No joint pain or swelling.  No decreased range of motion.  No back pain.    Physical Exam  BP 122/84 (BP Location: Left Arm, Cuff Size: Normal)   Pulse 83   Ht 5\' 7"  (1.702 m)   Wt 231 lb (104.8 kg)   LMP 12/25/2011   SpO2 95%   BMI 36.18 kg/m   GEN: A/Ox3; pleasant , NAD, well nourished , obese    HEENT:  [place]/AT,  EACs-clear, TMs-wnl, NOSE-clear, THROAT-clear, no lesions, no postnasal drip or exudate noted. Class 2 -3 MP airway   NECK:  Supple w/ fair ROM; no JVD; normal carotid impulses w/o bruits; no thyromegaly or nodules palpated; no lymphadenopathy.    RESP  Clear  P & A; w/o, wheezes/ rales/ or rhonchi. no accessory muscle use, no dullness to percussion  CARD:  RRR, no m/r/g, tr peripheral edema, pulses intact, no cyanosis or clubbing.  GI:   Soft & nt; nml bowel sounds; no organomegaly or masses detected.   Musco: Warm bil, no deformities or joint swelling noted.   Neuro: alert, no focal deficits noted.    Skin: Warm, no lesions or rashes    Lab Results:  CBC  BMET  No results found.   Assessment & Plan:   No problem-specific Assessment & Plan notes found for this encounter.     Rexene Edison, NP 01/12/2018

## 2018-01-12 NOTE — Assessment & Plan Note (Signed)
Doing well on CPAP   Plan  Patient Instructions  Continue on CPAP At bedtime  .  Keep up good work .  Work on healthy weight .  Do not drive if sleepy .  Follow up with Dr. Halford Chessman  In 1 year and As needed

## 2018-01-12 NOTE — Progress Notes (Signed)
Reviewed and agree with assessment/plan.   Mrytle Bento, MD [facility] 10/23/2016, 12:24 PM Pager:  [phone]  

## 2018-01-12 NOTE — Assessment & Plan Note (Signed)
Wt loss  

## 2018-02-12 ENCOUNTER — Ambulatory Visit (INDEPENDENT_AMBULATORY_CARE_PROVIDER_SITE_OTHER): Payer: Medicare Other | Admitting: Family Medicine

## 2018-02-12 ENCOUNTER — Encounter: Payer: Self-pay | Admitting: Family Medicine

## 2018-02-12 VITALS — BP 120/72 | HR 82 | Temp 97.7°F | Resp 18 | Ht 67.0 in | Wt 236.4 lb

## 2018-02-12 DIAGNOSIS — I10 Essential (primary) hypertension: Secondary | ICD-10-CM

## 2018-02-12 DIAGNOSIS — E559 Vitamin D deficiency, unspecified: Secondary | ICD-10-CM | POA: Diagnosis not present

## 2018-02-12 DIAGNOSIS — R739 Hyperglycemia, unspecified: Secondary | ICD-10-CM

## 2018-02-12 DIAGNOSIS — I482 Chronic atrial fibrillation, unspecified: Secondary | ICD-10-CM

## 2018-02-12 DIAGNOSIS — D508 Other iron deficiency anemias: Secondary | ICD-10-CM

## 2018-02-12 DIAGNOSIS — E0590 Thyrotoxicosis, unspecified without thyrotoxic crisis or storm: Secondary | ICD-10-CM

## 2018-02-12 DIAGNOSIS — M1990 Unspecified osteoarthritis, unspecified site: Secondary | ICD-10-CM

## 2018-02-12 DIAGNOSIS — E782 Mixed hyperlipidemia: Secondary | ICD-10-CM

## 2018-02-12 NOTE — Patient Instructions (Addendum)
Soak your feet in hot water and hydrogen peroxide, 10 to 15 minutes a night then dry well  Lidocaine Gel, aspercreme or icy hot or salon pas   TENS unit electrical unit to help with pain  Restless Legs Syndrome Restless legs syndrome is a condition that causes uncomfortable feelings or sensations in the legs, especially while sitting or lying down. The sensations usually cause an overwhelming urge to move the legs. The arms can also sometimes be affected. The condition can range from mild to severe. The symptoms often interfere with a person's ability to sleep. What are the causes? The cause of this condition is not known. What increases the risk? This condition is more likely to develop in:  People who are older than age 5.  Pregnant women. In general, restless legs syndrome is more common in women than in men.  People who have a family history of the condition.  People who have certain medical conditions, such as iron deficiency, kidney disease, Parkinson disease, or nerve damage.  People who take certain medicines, such as medicines for high blood pressure, nausea, colds, allergies, depression, and some heart conditions.  What are the signs or symptoms? The main symptom of this condition is uncomfortable sensations in the legs. These sensations may be:  Described as pulling, tingling, prickling, throbbing, crawling, or burning.  Worse while you are sitting or lying down.  Worse during periods of rest or inactivity.  Worse at night, often interfering with your sleep.  Accompanied by a very strong urge to move your legs.  Temporarily relieved by movement of your legs.  The sensations usually affect both sides of the body. The arms can also be affected, but this is rare. People who have this condition often have tiredness during the day because of their lack of sleep at night. How is this diagnosed? This condition may be diagnosed based on your description of the symptoms.  You may also have tests, including blood tests, to check for other conditions that may lead to your symptoms. In some cases, you may be asked to spend some time in a sleep lab so your sleeping can be monitored. How is this treated? Treatment for this condition is focused on managing the symptoms. Treatment may include:  Self-help and lifestyle changes.  Medicines.  Follow these instructions at home:  Take medicines only as directed by your health care provider.  Try these methods to get temporary relief from the uncomfortable sensations: ? Massage your legs. ? Walk or stretch. ? Take a cold or hot bath.  Practice good sleep habits. For example, go to bed and get up at the same time every day.  Exercise regularly.  Practice ways of relaxing, such as yoga or meditation.  Avoid caffeine and alcohol.  Do not use any tobacco products, including cigarettes, chewing tobacco, or electronic cigarettes. If you need help quitting, ask your health care provider.  Keep all follow-up visits as directed by your health care provider. This is important. Contact a health care provider if: Your symptoms do not improve with treatment, or they get worse. This information is not intended to replace advice given to you by your health care provider. Make sure you discuss any questions you have with your health care provider. Document Released: 10/04/2002 Document Revised: 03/21/2016 Document Reviewed: 10/10/2014 Elsevier Interactive Patient Education  [employer].

## 2018-02-13 LAB — LIPID PANEL
Cholesterol: 163 mg/dL (ref <200)
HDL: 65 mg/dL (ref >50)
LDL Cholesterol (Calc): 77 mg/dL (calc)
NON-HDL CHOLESTEROL (CALC): 98 mg/dL (ref <130)
TRIGLYCERIDES: 129 mg/dL (ref <150)
Total CHOL/HDL Ratio: 2.5 (calc) (ref <5.0)

## 2018-02-13 LAB — COMPREHENSIVE METABOLIC PANEL
AG RATIO: 1.5 (calc) (ref 1.0–2.5)
ALT: 10 U/L (ref 6–29)
AST: 15 U/L (ref 10–35)
Albumin: 4 g/dL (ref 3.6–5.1)
Alkaline phosphatase (APISO): 68 U/L (ref 33–130)
BUN / CREAT RATIO: 24 (calc) — AB (ref 6–22)
BUN: 24 mg/dL (ref 7–25)
CO2: 23 mmol/L (ref 20–32)
Calcium: 9.3 mg/dL (ref 8.6–10.4)
Chloride: 107 mmol/L (ref 98–110)
Creat: 0.98 mg/dL — ABNORMAL HIGH (ref 0.60–0.88)
GLUCOSE: 84 mg/dL (ref 65–99)
Globulin: 2.6 g/dL (calc) (ref 1.9–3.7)
Potassium: 4.1 mmol/L (ref 3.5–5.3)
Sodium: 140 mmol/L (ref 135–146)
Total Bilirubin: 0.5 mg/dL (ref 0.2–1.2)
Total Protein: 6.6 g/dL (ref 6.1–8.1)

## 2018-02-13 LAB — CBC
HCT: 40.5 % (ref 35.0–45.0)
Hemoglobin: 13.7 g/dL (ref 11.7–15.5)
MCH: 32.2 pg (ref 27.0–33.0)
MCHC: 33.8 g/dL (ref 32.0–36.0)
MCV: 95.1 fL (ref 80.0–100.0)
MPV: 9.9 fL (ref 7.5–12.5)
PLATELETS: 216 10*3/uL (ref 140–400)
RBC: 4.26 10*6/uL (ref 3.80–5.10)
RDW: 12.4 % (ref 11.0–15.0)
WBC: 7.9 10*3/uL (ref 3.8–10.8)

## 2018-02-13 LAB — HEMOGLOBIN A1C
HEMOGLOBIN A1C: 6.1 %{Hb} — AB (ref <5.7)
MEAN PLASMA GLUCOSE: 128 (calc)
eAG (mmol/L): 7.1 (calc)

## 2018-02-13 LAB — TSH: TSH: 2.03 m[IU]/L (ref 0.40–4.50)

## 2018-02-13 LAB — VITAMIN D 25 HYDROXY (VIT D DEFICIENCY, FRACTURES): VIT D 25 HYDROXY: 26 ng/mL — AB (ref 30–100)

## 2018-02-16 NOTE — Assessment & Plan Note (Signed)
Encouraged heart healthy diet, increase exercise, avoid trans fats, consider a krill oil cap daily 

## 2018-02-16 NOTE — Assessment & Plan Note (Signed)
Well controlled, no changes to meds. Encouraged heart healthy diet such as the DASH diet and exercise as tolerated.  °

## 2018-02-16 NOTE — Assessment & Plan Note (Signed)
minimize simple carbs. Increase exercise as tolerated.  

## 2018-02-16 NOTE — Progress Notes (Signed)
Patient ID: Gina Reilly, female   DOB: [birth year], 84 y.o.   MRN: [id number]   Subjective:    Patient ID: Gina Reilly, female    DOB: 1924-03-23, 84 y.o.   MRN: [id number]  Chief Complaint  Patient presents with  . Hypertension    Pt here for follow up  . Hyperglycemia    Pt here for follow up  . Foot Problem    Pt thinks she had a "wart" removed from right foot at pedicure 2 weeks ago and would like doctor to assess.    HPI Patient is in today for follow up. She is struggling with daily pain most notably on right side in foot, knee and hip. No recent fall or injury. She continues to struggle with fatigue and pedal edema at times. Denies CP/palp/SOB/HA/congestion/fevers/GI or GU c/o. Taking meds as prescribed  Past Medical History:  Diagnosis Date  . 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 Anemia   . Apnea, sleep 04/24/2012  . Arthritis    osteo  . Bronchitis 12/25/2011  . Chicken pox as a child  . Chronic atrial fibrillation ([place])    a. 07/2010 TEE/DCCV:  EF 60-65% mild MR.; b. Recurrent Afib--> Amiodarone;  Pradaxa (CHA2DS2VASc = 4);  c. Amio d/c'd 2/2 toxicity - now chronic rate-controlled afib.  . Constipation 11/22/2014  . Contusion of leg, left 01/16/2012  . GERD (gastroesophageal reflux disease)   . History of echocardiogram    a. 07/2015 Echo: EF 60-65%, Ao sclerosis, mild MR, mildly dil LA/RV/RA, mild Tr, PASP 79mHg.  .Marland KitchenHyperglycemia 01/10/2010   Qualifier: Diagnosis of  By: SNiel HummerMD, [place]lipidemia   . Hypertension   . Hyperthyroidism 02/09/2009   Qualifier: Diagnosis of  By: SNiel HummerMD, WFranklin CenterHypoxia 04/14/2012  . [place] 05/[place]  . Measles as a child  . Medicare annual wellness visit, subsequent 11/27/2014   Follows with pulmonology, Dr CGwenette GreetFollow with cardiology, Dr CRance Muirwith Dr HMatthew Saras [facility].  . Mumps as a child  . Obese   . Ovarian cyst   . Pelvic mass in female   . Peripheral neuropathy 02/26/2012  . Precancerous skin lesion   . Skin cancer    left temple  . Thyroid disease    hypothroidism  . Vitamin D deficiency     Past Surgical History:  Procedure Laterality Date  . ABDOMINAL HYSTERECTOMY    . CATARACT EXTRACTION     Dr. TSatira Sark . SKIN BIOPSY     left temple. cancerous  . TOTAL HIP ARTHROPLASTY  2003   left    Family History  Problem Relation Age of Onset  . Stroke Mother   . Prostate cancer Father        prostate  . Hypertension Brother   . Cancer Maternal Grandmother   . Hypertension Brother   . Heart disease Brother   . Obesity Brother   . Hypertension Brother   . Hyperlipidemia Brother   . Heart disease Brother   . Kidney disease Brother   . Cancer Daughter        kidney- spine and blood stream  . Heart disease Son        valvular heart disease  . Prostate cancer Brother  Social History   Socioeconomic History  . Marital status: Widowed    Spouse name: Not on file  . Number of children: 77  . Years of education: Not on file  . Highest education level: Not on file  Occupational History  . Occupation: retired    Fish farm manager: RETIRED  Social Needs  . Financial resource strain: Not on file  . Food insecurity:    Worry: Not on file    Inability: Not on file  . Transportation needs:    Medical: Not on file    Non-medical: Not on file  Tobacco Use  . Smoking status: Former Smoker    Packs/day: 0.30    Years: 5.00    Pack years: 1.50    Types: Cigarettes    Last attempt to quit: 10/28/1973    Years since quitting: 44.3  . Smokeless tobacco: Never Used  Substance and Sexual Activity  . Alcohol use: Yes    Comment: socially  . Drug use: No  . Sexual activity: Not on file  Lifestyle  . Physical activity:    Days per week: Not on file    Minutes per session:  Not on file  . Stress: Not on file  Relationships  . Social connections:    Talks on phone: Not on file    Gets together: Not on file    Attends religious service: Not on file    Active member of club or organization: Not on file    Attends meetings of clubs or organizations: Not on file    Relationship status: Not on file  . Intimate partner violence:    Fear of current or ex partner: Not on file    Emotionally abused: Not on file    Physically abused: Not on file    Forced sexual activity: Not on file  Other Topics Concern  . Not on file  Social History Narrative  . Not on file    Outpatient Medications Prior to Visit  Medication Sig Dispense Refill  . acetaminophen (TYLENOL) 500 MG tablet Take 3,000 mg by mouth daily.     . Cholecalciferol (VITAMIN D PO) Take 1 capsule by mouth daily.     Marland Kitchen diltiazem (CARDIZEM CD) 360 MG 24 hr capsule TAKE 1 CAPSULE DAILY 90 capsule 2  . Docusate Sodium (PHILLIPS STOOL SOFTENER PO) Take 1 tablet by mouth as needed.     . furosemide (LASIX) 20 MG tablet Take 1 tablet (20 mg total) by mouth as needed. 30 tablet 3  . losartan (COZAAR) 25 MG tablet TAKE 1 TABLET DAILY IN THE EVENING 90 tablet 2  . PRADAXA 150 MG CAPS capsule TAKE 1 CAPSULE TWICE A DAY 180 capsule 2  . Probiotic Product (PROBIOTIC DAILY PO) Take 1 tablet by mouth daily.    . ranitidine (ZANTAC) 150 MG tablet Take 1 tablet (150 mg total) by mouth daily as needed for heartburn. 30 tablet 1   No facility-administered medications prior to visit.     Allergies  Allergen Reactions  . Amiodarone Other (See Comments)    Amiodarone pulmonary toxicity.  . Aspercreme [Trolamine Salicylate] Other (See Comments)    Pt states it caused redness like it was burning her skin.    Review of Systems  Constitutional: Negative for fever and malaise/fatigue.  HENT: Negative for congestion.   Eyes: Negative for blurred vision.  Respiratory: Negative for shortness of breath.   Cardiovascular:  Negative for chest pain, palpitations and leg swelling.  Gastrointestinal: Negative  for abdominal pain, blood in stool and nausea.  Genitourinary: Negative for dysuria and frequency.  Musculoskeletal: Negative for falls.  Skin: Negative for rash.  Neurological: Negative for dizziness, loss of consciousness and headaches.  Endo/Heme/Allergies: Negative for environmental allergies.  Psychiatric/Behavioral: Negative for depression. The patient is not nervous/anxious.        Objective:    Physical Exam  Constitutional: She is oriented to person, place, and time. She appears well-developed and well-nourished. No distress.  HENT:  [place]: Normocephalic and atraumatic.  Nose: Nose normal.  Eyes: Right eye exhibits no discharge. Left eye exhibits no discharge.  Neck: Normal range of motion. Neck supple.  Cardiovascular: Normal rate and regular rhythm.  No murmur heard. Pulmonary/Chest: Effort normal and breath sounds normal.  Abdominal: Soft. Bowel sounds are normal. There is no tenderness.  Musculoskeletal: She exhibits no edema.  Neurological: She is alert and oriented to person, place, and time.  Skin: Skin is warm and dry.  Psychiatric: She has a normal mood and affect.  Nursing note and vitals reviewed.   BP 120/72 (BP Location: Left Arm) Comment (BP Location): forearm  Pulse 82   Temp 97.7 F (36.5 C) (Oral)   Resp 18   Ht _0  (1.702 m)   Wt 236 lb 6.4 oz (107.2 kg)   LMP 12/25/2011   SpO2 98%   BMI 37.03 kg/m  Wt Readings from Last 3 Encounters:  02/12/18 236 lb 6.4 oz (107.2 kg)  01/12/18 231 lb (104.8 kg)  11/11/17 235 lb (106.6 kg)     Lab Results  Component Value Date   WBC 7.9 02/12/2018   HGB 13.7 02/12/2018   HCT 40.5 02/12/2018   PLT 216 02/12/2018   GLUCOSE 84 02/12/2018   CHOL 163 02/12/2018   TRIG 129 02/12/2018   HDL 65 02/12/2018   LDLDIRECT 128.4 02/09/2009   LDLCALC 77 02/12/2018   ALT 10 02/12/2018   AST 15 02/12/2018   NA 140 02/12/2018     K 4.1 02/12/2018   CL 107 02/12/2018   CREATININE 0.98 (H) 02/12/2018   BUN 24 02/12/2018   CO2 23 02/12/2018   TSH 2.03 02/12/2018   INR 1.32 08/17/2010   HGBA1C 6.1 (H) 02/12/2018    Lab Results  Component Value Date   TSH 2.03 02/12/2018   Lab Results  Component Value Date   WBC 7.9 02/12/2018   HGB 13.7 02/12/2018   HCT 40.5 02/12/2018   MCV 95.1 02/12/2018   PLT 216 02/12/2018   Lab Results  Component Value Date   NA 140 02/12/2018   K 4.1 02/12/2018   CO2 23 02/12/2018   GLUCOSE 84 02/12/2018   BUN 24 02/12/2018   CREATININE 0.98 (H) 02/12/2018   BILITOT 0.5 02/12/2018   ALKPHOS 68 11/11/2017   AST 15 02/12/2018   ALT 10 02/12/2018   PROT 6.6 02/12/2018   ALBUMIN 4.0 11/11/2017   CALCIUM 9.3 02/12/2018   GFR 69.32 11/11/2017   Lab Results  Component Value Date   CHOL 163 02/12/2018   Lab Results  Component Value Date   HDL 65 02/12/2018   Lab Results  Component Value Date   LDLCALC 77 02/12/2018   Lab Results  Component Value Date   TRIG 129 02/12/2018   Lab Results  Component Value Date   CHOLHDL 2.5 02/12/2018   Lab Results  Component Value Date   HGBA1C 6.1 (H) 02/12/2018       Assessment & Plan:   Problem  List Items Addressed This Visit    Hyperthyroidism   Relevant Orders   TSH (Completed)   Comp Met (CMET) (Completed)   Hyperglycemia     minimize simple carbs. Increase exercise as tolerated.       Relevant Orders   HgB A1c (Completed)   Vitamin D deficiency - Primary    Encouraged supplementation.      Relevant Orders   Vitamin D (25 hydroxy) (Completed)   Hyperlipidemia    Encouraged heart healthy diet, increase exercise, avoid trans fats, consider a krill oil cap daily      Relevant Orders   Lipid panel (Completed)   Comp Met (CMET) (Completed)   Anemia, unspecified   Relevant Orders   CBC (Completed)   ATRIAL FIBRILLATION    Rate controlled.      Arthritis    Noting right foot pain, knee and hip pain  daily may continue Tylenol tid, lidocaine gel prn      Hypertension    Well controlled, no changes to meds. Encouraged heart healthy diet such as the DASH diet and exercise as tolerated.          I am having Quinci N. Antonini maintain her Probiotic Product (PROBIOTIC DAILY PO), furosemide, ranitidine, Cholecalciferol (VITAMIN D PO), Docusate Sodium (PHILLIPS STOOL SOFTENER PO), acetaminophen, PRADAXA, diltiazem, and losartan.  No orders of the defined types were placed in this encounter.    Penni Homans, MD

## 2018-02-16 NOTE — Assessment & Plan Note (Signed)
Rate controlled 

## 2018-02-16 NOTE — Assessment & Plan Note (Signed)
Noting right foot pain, knee and hip pain daily may continue Tylenol tid, lidocaine gel prn

## 2018-02-16 NOTE — Assessment & Plan Note (Signed)
Encouraged supplementation 

## 2018-02-17 MED ORDER — VITAMIN D (ERGOCALCIFEROL) 1.25 MG (50000 UNIT) PO CAPS: 50000.0000 [IU] | ORAL_CAPSULE | ORAL | 4 refills | Status: DC

## 2018-02-17 NOTE — Addendum Note (Signed)
Addended by: Wynonia Musty A on: 02/17/2018 02:53 PM   Modules accepted: Orders

## 2018-04-20 ENCOUNTER — Encounter: Payer: Self-pay | Admitting: Podiatry

## 2018-04-20 ENCOUNTER — Encounter

## 2018-04-20 ENCOUNTER — Ambulatory Visit (INDEPENDENT_AMBULATORY_CARE_PROVIDER_SITE_OTHER): Payer: Medicare Other | Admitting: Podiatry

## 2018-04-20 VITALS — BP 147/91 | HR 91 | Resp 16

## 2018-04-20 DIAGNOSIS — D689 Coagulation defect, unspecified: Secondary | ICD-10-CM

## 2018-04-20 DIAGNOSIS — L84 Corns and callosities: Secondary | ICD-10-CM

## 2018-04-20 NOTE — Progress Notes (Signed)
   Subjective:    Patient ID: Gina Reilly, female    DOB: April 11, 1924, 84 y.o.   MRN: [id number]  HPI    Review of Systems  All other systems reviewed and are negative.      Objective:   Physical Exam        Assessment & Plan:

## 2018-04-22 NOTE — Progress Notes (Signed)
Subjective:   Patient ID: Gina Reilly, female   DOB: 84 y.o.   MRN: [id number]   HPI Patient presents stating the bottom of the right foot has really been bothering her and making it hard to walk.  Patient is on blood thinner and cannot take care of this herself   Review of Systems  All other systems reviewed and are negative.       Objective:  Physical Exam  Constitutional: She appears well-developed and well-nourished.  Cardiovascular: Intact distal pulses.  Pulmonary/Chest: Effort normal.  Musculoskeletal: Normal range of motion.  Neurological: She is alert.  Skin: Skin is warm.  Nursing note and vitals reviewed.   Neurovascular status intact with patient noted to have discomfort in the plantar aspect right with keratotic lesion formation underneath the first metatarsal with prominence of the metatarsal bone and inflammation fluid buildup upon deep palpation to the area     Assessment:  Structural deformity with patient on blood thinner with keratotic lesion is painful and she has trouble walking on     Plan:  H&P x-ray reviewed and today I went ahead and did deep debridement of the lesion with no iatrogenic bleeding applied padding to the area with cushioning and instructed on padded shoes.  Reappoint as symptoms indicate may require more aggressive treatment  X-rays indicated there is calcification around this area with no indications of advanced arthritis with moderate osteoporosis noted

## 2018-05-06 ENCOUNTER — Other Ambulatory Visit: Payer: Self-pay | Admitting: Family Medicine

## 2018-05-06 DIAGNOSIS — E559 Vitamin D deficiency, unspecified: Secondary | ICD-10-CM

## 2018-06-02 ENCOUNTER — Other Ambulatory Visit: Payer: Self-pay | Admitting: Cardiology

## 2018-06-08 ENCOUNTER — Encounter: Payer: Self-pay | Admitting: Medical

## 2018-06-08 ENCOUNTER — Ambulatory Visit (INDEPENDENT_AMBULATORY_CARE_PROVIDER_SITE_OTHER): Payer: Medicare Other | Admitting: Medical

## 2018-06-08 ENCOUNTER — Ambulatory Visit (HOSPITAL_BASED_OUTPATIENT_CLINIC_OR_DEPARTMENT_OTHER)
Admission: RE | Admit: 2018-06-08 | Discharge: 2018-06-08 | Disposition: A | Discharge status: Home / Self Care | Payer: Medicare Other | Source: Ambulatory Visit | Attending: Medical | Admitting: Medical

## 2018-06-08 ENCOUNTER — Telehealth: Payer: Self-pay | Admitting: Medical

## 2018-06-08 VITALS — BP 125/74 | HR 89 | Temp 97.8°F | Resp 16 | Ht 67.0 in | Wt 230.0 lb

## 2018-06-08 DIAGNOSIS — M25551 Pain in right hip: Secondary | ICD-10-CM

## 2018-06-08 DIAGNOSIS — M1711 Unilateral primary osteoarthritis, right knee: Secondary | ICD-10-CM | POA: Insufficient documentation

## 2018-06-08 DIAGNOSIS — M1611 Unilateral primary osteoarthritis, right hip: Secondary | ICD-10-CM | POA: Insufficient documentation

## 2018-06-08 DIAGNOSIS — R6883 Chills (without fever): Secondary | ICD-10-CM

## 2018-06-08 DIAGNOSIS — M25561 Pain in right knee: Secondary | ICD-10-CM

## 2018-06-08 MED ORDER — PREDNISONE 10 MG PO TABS: ORAL_TABLET | ORAL | 0 refills | Status: DC

## 2018-06-08 NOTE — Telephone Encounter (Signed)
Referral to Dr. Barbaraann Barthel placed.

## 2018-06-08 NOTE — Progress Notes (Signed)
Subjective:    Patient ID: Gina Reilly, female    DOB: [birth year], 84 y.o.   MRN: [id number]  HPI  Pt in with some recent moderate to severe rt hip pain( 2 weeks). But has had pain for years per pt. Just recently became severe. Knee pain on that side as well.  Pt does note left hip pain replaced in 2003 and no problems since that surgery.   Pt states almost can't walk due to pain. States even adjusting self in chair has pain. Hurts to stand.  Pt does use some tylenol for pain. Sporadically will get chill. No sweating.  No fever.   Review of Systems  Constitutional: Negative for chills, fatigue and fever.  Respiratory: Negative for cough, chest tightness, shortness of breath and wheezing.   Cardiovascular: Negative for chest pain and palpitations.  Gastrointestinal: Negative for abdominal pain.  Musculoskeletal: Negative for back pain.       Rt hip pain and rt knee pain.  Skin: Negative for rash.  Neurological: Negative for dizziness, seizures, syncope, weakness and headaches.  Hematological: Negative for adenopathy. Does not bruise/bleed easily.  Psychiatric/Behavioral: Negative for behavioral problems, confusion, sleep disturbance and suicidal ideas. The patient is not nervous/anxious.     Past Medical History:  Diagnosis Date  . 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 Anemia   . Apnea, sleep 04/24/2012  . Arthritis    osteo  . Bronchitis 12/25/2011  . Chicken pox as a child  . Chronic atrial fibrillation ([place])    a. 07/2010 TEE/DCCV:  EF 60-65% mild MR.; b. Recurrent Afib--> Amiodarone;  Pradaxa (CHA2DS2VASc = 4);  c. Amio d/c'd 2/2 toxicity - now chronic rate-controlled afib.  . Constipation 11/22/2014  . Contusion of leg, left 01/16/2012  . GERD (gastroesophageal reflux disease)   . History of echocardiogram    a. 07/2015 Echo: EF 60-65%, Ao sclerosis, mild MR,  mildly dil LA/RV/RA, mild Tr, PASP 28mmHg.  Marland Kitchen Hyperglycemia 01/10/2010   Qualifier: Diagnosis of  By: Niel Hummer MD, [place] Hyperlipidemia   . Hypertension   . Hyperthyroidism 02/09/2009   Qualifier: Diagnosis of  By: Niel Hummer MD, [place] Hypoxia 04/14/2012  . Intermittent claudication ([place]) 05/22/2012  . Measles as a child  . Medicare annual wellness visit, subsequent 11/27/2014   Follows with pulmonology, Dr Gwenette Greet Follow with cardiology, Dr Rance Muir with Dr Matthew Saras Does not participate in [facility], pap or colonoscopy screening at this time.  . Mumps as a child  . Obese   . Ovarian cyst   . Pelvic mass in female   . Peripheral neuropathy 02/26/2012  . Precancerous skin lesion   . Skin cancer    left temple  . Thyroid disease    hypothroidism  . Vitamin D deficiency      Social History   Socioeconomic History  . Marital status: Widowed    Spouse name: Not on file  . Number of children: 81  . Years of education: Not on file  . Highest education level: Not on file  Occupational History  . Occupation: retired    Fish farm manager: RETIRED  Social Needs  . Financial resource strain: Not on file  . Food insecurity:    Worry: Not on file    Inability: Not on file  . Transportation needs:    Medical: Not  on file    Non-medical: Not on file  Tobacco Use  . Smoking status: Former Smoker    Packs/day: 0.30    Years: 5.00    Pack years: 1.50    Types: Cigarettes    Last attempt to quit: 10/28/1973    Years since quitting: 44.6  . Smokeless tobacco: Never Used  Substance and Sexual Activity  . Alcohol use: Yes    Comment: socially  . Drug use: No  . Sexual activity: Not on file  Lifestyle  . Physical activity:    Days per week: Not on file    Minutes per session: Not on file  . Stress: Not on file  Relationships  . Social connections:    Talks on phone: Not on file    Gets together: Not on file    Attends religious service: Not on file    Active member of club  or organization: Not on file    Attends meetings of clubs or organizations: Not on file    Relationship status: Not on file  . Intimate partner violence:    Fear of current or ex partner: Not on file    Emotionally abused: Not on file    Physically abused: Not on file    Forced sexual activity: Not on file  Other Topics Concern  . Not on file  Social History Narrative  . Not on file    Past Surgical History:  Procedure Laterality Date  . ABDOMINAL HYSTERECTOMY    . CATARACT EXTRACTION     Dr. Satira Sark  . SKIN BIOPSY     left temple. cancerous  . TOTAL HIP ARTHROPLASTY  2003   left    Family History  Problem Relation Age of Onset  . Stroke Mother   . Prostate cancer Father        prostate  . Hypertension Brother   . Cancer Maternal Grandmother   . Hypertension Brother   . Heart disease Brother   . Obesity Brother   . Hypertension Brother   . Hyperlipidemia Brother   . Heart disease Brother   . Kidney disease Brother   . Cancer Daughter        kidney- spine and blood stream  . Heart disease Son        valvular heart disease  . Prostate cancer Brother     Allergies  Allergen Reactions  . Amiodarone Other (See Comments)    Amiodarone pulmonary toxicity.  . Aspercreme [Trolamine Salicylate] Other (See Comments)    Pt states it caused redness like it was burning her skin.    Current Outpatient Medications on File Prior to Visit  Medication Sig Dispense Refill  . acetaminophen (TYLENOL) 500 MG tablet Take 3,000 mg by mouth daily.     . Cholecalciferol (VITAMIN D PO) Take 1 capsule by mouth daily.     Marland Kitchen diltiazem (CARDIZEM CD) 360 MG 24 hr capsule TAKE 1 CAPSULE DAILY 90 capsule 2  . Docusate Sodium (PHILLIPS STOOL SOFTENER PO) Take 1 tablet by mouth as needed.     . furosemide (LASIX) 20 MG tablet Take 1 tablet (20 mg total) by mouth as needed. 30 tablet 3  . losartan (COZAAR) 25 MG tablet TAKE 1 TABLET DAILY IN THE EVENING 90 tablet 2  . PRADAXA 150 MG CAPS  capsule TAKE 1 CAPSULE TWICE A DAY 180 capsule 2  . Probiotic Product (PROBIOTIC DAILY PO) Take 1 tablet by mouth daily.    . ranitidine (ZANTAC)  150 MG tablet Take 1 tablet (150 mg total) by mouth daily as needed for heartburn. 30 tablet 1  . Vitamin D, Ergocalciferol, (DRISDOL) 50000 units CAPS capsule TAKE 1 CAPSULE (50,000 UNITS TOTAL) BY MOUTH ONCE A WEEK. TAKE ONCE A WEEK FOR 12 WEEKS. 12 capsule 1   No current facility-administered medications on file prior to visit.     BP 125/74   Pulse 89   Temp 97.8 F (36.6 C) (Oral)   Resp 16   Ht 5\' 7"  (1.702 m)   Wt 230 lb (104.3 kg)   LMP 12/25/2011   SpO2 99%   BMI 36.02 kg/m       Objective:   Physical Exam   General Mental Status- Alert. General Appearance- Not in acute distress.   Skin General: Color- Normal Color. Moisture- Normal Moisture.  Neck Carotid Arteries- Normal color. Moisture- Normal Moisture. No carotid bruits. No JVD.  Chest and Lung Exam Auscultation: Breath Sounds:-Normal.  Cardiovascular Auscultation:Rythm- Regular. Murmurs & Other Heart Sounds:Auscultation of the heart reveals- No Murmurs.  Abdomen Inspection:-Inspeection Normal. Palpation/Percussion:Note:No mass. Palpation and Percussion of the abdomen reveal- Non Tender, Non Distended + BS, no rebound or guarding.    Neurologic Cranial Nerve exam:- CN III-XII intact(No nystagmus), symmetric smile. Strength:- 5/5 equal and symmetric strength both upper and lower extremities.     Assessment & Plan:  For rt hip and knee pain, I placed orders to get those today. We will update you on xray report.   With your history of need to avoid any nsaids.  Can continue tylenol. Adding 4 day taper dose of prednisone. Might need to add pain medicine if xray shows severe osteoarthritis and you pain persist high level.  Follow up date to be determined after lab review.  Mackie Pai, PA-C

## 2018-06-08 NOTE — Patient Instructions (Signed)
For rt hip and knee pain, I placed orders to get those today. We will update you on xray report.   With your history of need to avoid any nsaids.  Can continue tylenol. Adding 4 day taper dose of prednisone. Might need to add pain medicine if xray shows severe osteoarthritis and you pain persist high level.  Follow up date to be determined after lab review.

## 2018-06-09 LAB — CBC WITH DIFFERENTIAL/PLATELET
BASOS PCT: 0.8 % (ref 0.0–3.0)
Basophils Absolute: 0.1 10*3/uL (ref 0.0–0.1)
Eosinophils Absolute: 0.1 10*3/uL (ref 0.0–0.7)
Eosinophils Relative: 0.9 % (ref 0.0–5.0)
HCT: 41 % (ref 36.0–46.0)
Hemoglobin: 13.9 g/dL (ref 12.0–15.0)
LYMPHS ABS: 1.1 10*3/uL (ref 0.7–4.0)
Lymphocytes Relative: 12.7 % (ref 12.0–46.0)
MCHC: 33.9 g/dL (ref 30.0–36.0)
MCV: 98.5 fl (ref 78.0–100.0)
Monocytes Absolute: 0.8 10*3/uL (ref 0.1–1.0)
Monocytes Relative: 8.9 % (ref 3.0–12.0)
NEUTROS ABS: 6.8 10*3/uL (ref 1.4–7.7)
NEUTROS PCT: 76.7 % (ref 43.0–77.0)
PLATELETS: 249 10*3/uL (ref 150.0–400.0)
RBC: 4.16 Mil/uL (ref 3.87–5.11)
RDW: 12.8 % (ref 11.5–15.5)
WBC: 8.9 10*3/uL (ref 4.0–10.5)

## 2018-06-10 ENCOUNTER — Telehealth: Payer: Self-pay | Admitting: Family Medicine

## 2018-06-10 NOTE — Telephone Encounter (Signed)
Pt returned call for results of lab and knee. Message given to her with verbal understanding. She will be seeing Dr. Barbaraann Barthel on Tuesday at 1:30pm.

## 2018-06-16 ENCOUNTER — Ambulatory Visit (INDEPENDENT_AMBULATORY_CARE_PROVIDER_SITE_OTHER): Payer: Medicare Other | Admitting: Family Medicine

## 2018-06-16 ENCOUNTER — Encounter: Payer: Self-pay | Admitting: Family Medicine

## 2018-06-16 VITALS — BP 140/88 | HR 90 | Ht 67.0 in | Wt 230.0 lb

## 2018-06-16 DIAGNOSIS — G8929 Other chronic pain: Secondary | ICD-10-CM

## 2018-06-16 DIAGNOSIS — M25551 Pain in right hip: Secondary | ICD-10-CM

## 2018-06-16 DIAGNOSIS — M25561 Pain in right knee: Secondary | ICD-10-CM | POA: Diagnosis not present

## 2018-06-16 NOTE — Patient Instructions (Signed)
Your pain is due to arthritis. These are the different medications you can take for this: Tylenol 500mg  1-2 tabs three times a day for pain. Biofreeze topically up to four times a day may also help with pain. Some supplements that may help for arthritis: Boswellia extract, curcumin, pycnogenol Aleve 1-2 tabs twice a day with food rarely Cortisone injections are an option - let me know if you want to do this and we'll do it in the office. Consider physical therapy to strengthen muscles around the joint that hurts to take pressure off of the joint itself. Heat or ice 15 minutes at a time 3-4 times a day as needed to help with pain. Follow up with me in 1 month.

## 2018-06-17 ENCOUNTER — Encounter: Payer: Self-pay | Admitting: Family Medicine

## 2018-06-17 NOTE — Progress Notes (Signed)
PCP and consultation requested by: Mosie Lukes, MD  Subjective:   HPI: Patient is a 84 y.o. female here for right hip, knee pain.  Patient reports that she has had over a year of anterolateral right hip pain up to 9 out of 10 level, sharp. More recently she is had a more difficult time walking due to this pain. Denies any night pain. She has been taking prednisone which she states has brought the pain level down to a 2 out of 10 the walking is still difficult. Denies night pain. She has been using 2 canes to help get around. She had a left hip replaced in 2003 by Dr. Veverly Fells Right knee pain is anterior and also hurts with walking but not as bad as the hip. No skin changes, numbness.  Past Medical History:  Diagnosis Date  . 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 Anemia   . Apnea, sleep 04/24/2012  . Arthritis    osteo  . Bronchitis 12/25/2011  . Chicken pox as a child  . Chronic atrial fibrillation ([place])    a. 07/2010 TEE/DCCV:  EF 60-65% mild MR.; b. Recurrent Afib--> Amiodarone;  Pradaxa (CHA2DS2VASc = 4);  c. Amio d/c'd 2/2 toxicity - now chronic rate-controlled afib.  . Constipation 11/22/2014  . Contusion of leg, left 01/16/2012  . GERD (gastroesophageal reflux disease)   . History of echocardiogram    a. 07/2015 Echo: EF 60-65%, Ao sclerosis, mild MR, mildly dil LA/RV/RA, mild Tr, PASP 4mmHg.  Marland Kitchen Hyperglycemia 01/10/2010   Qualifier: Diagnosis of  By: Niel Hummer MD, [place] Hyperlipidemia   . Hypertension   . Hyperthyroidism 02/09/2009   Qualifier: Diagnosis of  By: Niel Hummer MD, [place] Hypoxia 04/14/2012  . Intermittent claudication ([place]) 05/22/2012  . Measles as a child  . Medicare annual wellness visit, subsequent 11/27/2014   Follows with pulmonology, Dr Gwenette Greet Follow with cardiology, Dr Rance Muir with Dr Matthew Saras Does not participate in  [facility], pap or colonoscopy screening at this time.  . Mumps as a child  . Obese   . Ovarian cyst   . Pelvic mass in female   . Peripheral neuropathy 02/26/2012  . Precancerous skin lesion   . Skin cancer    left temple  . Thyroid disease    hypothroidism  . Vitamin D deficiency     Current Outpatient Medications on File Prior to Visit  Medication Sig Dispense Refill  . acetaminophen (TYLENOL) 500 MG tablet Take 3,000 mg by mouth daily.     . Cholecalciferol (VITAMIN D PO) Take 1 capsule by mouth daily.     Marland Kitchen diltiazem (CARDIZEM CD) 360 MG 24 hr capsule TAKE 1 CAPSULE DAILY 90 capsule 2  . Docusate Sodium (PHILLIPS STOOL SOFTENER PO) Take 1 tablet by mouth as needed.     . furosemide (LASIX) 20 MG tablet Take 1 tablet (20 mg total) by mouth as needed. 30 tablet 3  . losartan (COZAAR) 25 MG tablet TAKE 1 TABLET DAILY IN THE EVENING 90 tablet 2  . PRADAXA 150 MG CAPS capsule TAKE 1 CAPSULE TWICE A DAY 180 capsule 2  . predniSONE (DELTASONE) 10 MG tablet 4 tab po day 1, 3 tab po day 2, 2 tab po day 3, 1 tab po day 4 10 tablet 0  . Probiotic Product (PROBIOTIC DAILY PO)  Take 1 tablet by mouth daily.    . ranitidine (ZANTAC) 150 MG tablet Take 1 tablet (150 mg total) by mouth daily as needed for heartburn. 30 tablet 1  . Vitamin D, Ergocalciferol, (DRISDOL) 50000 units CAPS capsule TAKE 1 CAPSULE (50,000 UNITS TOTAL) BY MOUTH ONCE A WEEK. TAKE ONCE A WEEK FOR 12 WEEKS. 12 capsule 1   No current facility-administered medications on file prior to visit.     Past Surgical History:  Procedure Laterality Date  . ABDOMINAL HYSTERECTOMY    . CATARACT EXTRACTION     Dr. Satira Sark  . SKIN BIOPSY     left temple. cancerous  . TOTAL HIP ARTHROPLASTY  2003   left    Allergies  Allergen Reactions  . Amiodarone Other (See Comments)    Amiodarone pulmonary toxicity.  . Aspercreme [Trolamine Salicylate] Other (See Comments)    Pt states it caused redness like it was burning her skin.    Social  History   Socioeconomic History  . Marital status: Widowed    Spouse name: Not on file  . Number of children: 17  . Years of education: Not on file  . Highest education level: Not on file  Occupational History  . Occupation: retired    Fish farm manager: RETIRED  Social Needs  . Financial resource strain: Not on file  . Food insecurity:    Worry: Not on file    Inability: Not on file  . Transportation needs:    Medical: Not on file    Non-medical: Not on file  Tobacco Use  . Smoking status: Former Smoker    Packs/day: 0.30    Years: 5.00    Pack years: 1.50    Types: Cigarettes    Last attempt to quit: 10/28/1973    Years since quitting: 44.6  . Smokeless tobacco: Never Used  Substance and Sexual Activity  . Alcohol use: Yes    Comment: socially  . Drug use: No  . Sexual activity: Not on file  Lifestyle  . Physical activity:    Days per week: Not on file    Minutes per session: Not on file  . Stress: Not on file  Relationships  . Social connections:    Talks on phone: Not on file    Gets together: Not on file    Attends religious service: Not on file    Active member of club or organization: Not on file    Attends meetings of clubs or organizations: Not on file    Relationship status: Not on file  . Intimate partner violence:    Fear of current or ex partner: Not on file    Emotionally abused: Not on file    Physically abused: Not on file    Forced sexual activity: Not on file  Other Topics Concern  . Not on file  Social History Narrative  . Not on file    Family History  Problem Relation Age of Onset  . Stroke Mother   . Prostate cancer Father        prostate  . Hypertension Brother   . Cancer Maternal Grandmother   . Hypertension Brother   . Heart disease Brother   . Obesity Brother   . Hypertension Brother   . Hyperlipidemia Brother   . Heart disease Brother   . Kidney disease Brother   . Cancer Daughter        kidney- spine and blood stream  . Heart  disease Son  valvular heart disease  . Prostate cancer Brother     BP 140/88   Pulse 90   Ht 5\' 7"  (1.702 m)   Wt 230 lb (104.3 kg)   LMP 12/25/2011   BMI 36.02 kg/m   Review of Systems: See HPI above.     Objective:  Physical Exam:  Gen: NAD, comfortable in exam room  Right hip: No deformity. Minimal motion but with 5/5 strength. Diffuse mild tenderness throughout both lower extremities NVI distally. Positive logroll.  Left hip: No deformity. FROM with 5/5 strength. Diffuse mild tenderness throughout both lower extremities. NVI distally.   Right knee: No gross deformity, ecchymoses, swelling. Diffuse mild tenderness ROM 0 - 90 degrees. Negative ant/post drawers. Negative valgus/varus testing. Negative mcmurrays, apleys. NV intact distally.   Assessment & Plan:  1. Right hip pain - independently reviewed radiographs and noted mod-severe arthritis of hip.  Her exam is consistent with this being the source of her pain as well.  She has improved since starting prednisone.  We discussed Tylenol, Biofreeze, supplements may help for arthritis.  She can also take Aleve but rarely we discussed the risk of irritation to the stomach, kidneys, increased blood pressure with this.  Advised to consider cortisone injection though she would have to lie supine to be able to do this under ultrasound guidance here in the office.  We also discussed increased risk with this procedure given that she is on Pradaxa.  Heat or ice if needed.  Follow-up in 1 month.  2. Right knee pain - also consistent with arthritis but hip is her primary problem. Discussed similar measures for this.  Declined injection for now.

## 2018-06-18 ENCOUNTER — Ambulatory Visit: Payer: Medicare Other | Admitting: Family Medicine

## 2018-06-26 ENCOUNTER — Other Ambulatory Visit: Payer: Self-pay | Admitting: Cardiology

## 2018-06-26 NOTE — Telephone Encounter (Signed)
Rx sent to pharmacy   

## 2018-07-09 ENCOUNTER — Encounter: Payer: Self-pay | Admitting: Podiatry

## 2018-07-09 ENCOUNTER — Ambulatory Visit (INDEPENDENT_AMBULATORY_CARE_PROVIDER_SITE_OTHER): Payer: Medicare Other | Admitting: Podiatry

## 2018-07-09 ENCOUNTER — Other Ambulatory Visit: Payer: Self-pay | Admitting: Podiatry

## 2018-07-09 ENCOUNTER — Ambulatory Visit (INDEPENDENT_AMBULATORY_CARE_PROVIDER_SITE_OTHER): Payer: Medicare Other

## 2018-07-09 DIAGNOSIS — M79671 Pain in right foot: Secondary | ICD-10-CM

## 2018-07-09 DIAGNOSIS — M779 Enthesopathy, unspecified: Secondary | ICD-10-CM

## 2018-07-09 MED ORDER — TRIAMCINOLONE ACETONIDE 10 MG/ML IJ SUSP
10.0000 mg | Freq: Once | INTRAMUSCULAR | Status: AC
  Administered 2018-07-09: 10 mg

## 2018-07-11 NOTE — Progress Notes (Signed)
Subjective:   Patient ID: Gina Reilly, female   DOB: 84 y.o.   MRN: [id number]   HPI Patient presents with caregiver with significant discomfort in the dorsum of the right foot with no history of injury.  States is been going on around a month and she is tried Tylenol and reduced activity.  Patient does not smoke and would like to be a little bit more active   Review of Systems  All other systems reviewed and are negative.       Objective:  Physical Exam  Constitutional: She appears well-developed and well-nourished.  Cardiovascular: Intact distal pulses.  Pulmonary/Chest: Effort normal.  Musculoskeletal: Normal range of motion.  Neurological: She is alert.  Skin: Skin is warm.  Nursing note and vitals reviewed.   Neurovascular status found to be intact muscle strength was adequate with range of motion moderately diminished and forefoot discomfort of the fourth metatarsal phalangeal joint right and slightly into the third metatarsal phalangeal joint.  Patient does have good digital movement and was found to have good digital perfusion     Assessment:  Inflammatory capsulitis of the fourth and possible third MPJ of the right foot     Plan:  H&P x-ray reviewed and today I did a proximal block of the area sterile prep applied and I carefully injected the fourth MPJ with 2 mg Dexasone Kenalog 5 mg Xylocaine and advised on trying to wear shoes to reduce stress on the area.  Reappoint if symptoms persist  X-rays are negative for signs of stress fracture arthritis associated with this condition

## 2018-07-20 ENCOUNTER — Encounter: Payer: Self-pay | Admitting: Family Medicine

## 2018-07-20 ENCOUNTER — Ambulatory Visit (INDEPENDENT_AMBULATORY_CARE_PROVIDER_SITE_OTHER): Payer: Medicare Other | Admitting: Family Medicine

## 2018-07-20 VITALS — BP 121/82 | HR 76 | Ht 67.0 in | Wt 230.0 lb

## 2018-07-20 DIAGNOSIS — M1611 Unilateral primary osteoarthritis, right hip: Secondary | ICD-10-CM

## 2018-07-20 NOTE — Patient Instructions (Signed)
Your pain is due to arthritis. These are the different medications you can take for this: Tylenol 500mg  1-2 tabs three times a day for pain. Biofreeze topically up to four times a day may also help with pain. Some supplements that may help for arthritis: Boswellia extract, curcumin, pycnogenol Aleve 1-2 tabs twice a day with food rarely Cortisone injections are an option - let me know if you want to do this and we'll do it in the office. Consider physical therapy to strengthen muscles around the joint that hurts to take pressure off of the joint itself. Heat or ice 15 minutes at a time 3-4 times a day as needed to help with pain. Follow up with me as needed otherwise.

## 2018-07-20 NOTE — Progress Notes (Signed)
PCP and consultation requested by: Mosie Lukes, MD  Subjective:   HPI: Patient is a 84 y.o. female here for right hip, knee pain.  8/20: Patient reports that she has had over a year of anterolateral right hip pain up to 9 out of 10 level, sharp. More recently she is had a more difficult time walking due to this pain. Denies any night pain. She has been taking prednisone which she states has brought the pain level down to a 2 out of 10 the walking is still difficult. Denies night pain. She has been using 2 canes to help get around. She had a left hip replaced in 2003 by Dr. Veverly Fells Right knee pain is anterior and also hurts with walking but not as bad as the hip. No skin changes, numbness.  9/23: Patient returns for followup for right hip pain. She reports 0/10 pain while sitting, 7/10 when up and moving. She has been taking tylenol 1000mg  TID which is somewhat helpful. She is also using biofreeze which helps the muscles in her lower extremity. She ambulates with 2 canes. Overall no significant changes in her pain. She does state that she is not yet ready for a steroid injection.  No numbness.  Past Medical History:  Diagnosis Date  . 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 Anemia   . Apnea, sleep 04/24/2012  . Arthritis    osteo  . Bronchitis 12/25/2011  . Chicken pox as a child  . Chronic atrial fibrillation ([place])    a. 07/2010 TEE/DCCV:  EF 60-65% mild MR.; b. Recurrent Afib--> Amiodarone;  Pradaxa (CHA2DS2VASc = 4);  c. Amio d/c'd 2/2 toxicity - now chronic rate-controlled afib.  . Constipation 11/22/2014  . Contusion of leg, left 01/16/2012  . GERD (gastroesophageal reflux disease)   . History of echocardiogram    a. 07/2015 Echo: EF 60-65%, Ao sclerosis, mild MR, mildly dil LA/RV/RA, mild Tr, PASP 29mmHg.  Marland Kitchen Hyperglycemia 01/10/2010   Qualifier: Diagnosis of  By:  Niel Hummer MD, [place] Hyperlipidemia   . Hypertension   . Hyperthyroidism 02/09/2009   Qualifier: Diagnosis of  By: Niel Hummer MD, [place] Hypoxia 04/14/2012  . Intermittent claudication ([place]) 05/22/2012  . Measles as a child  . Medicare annual wellness visit, subsequent 11/27/2014   Follows with pulmonology, Dr Gwenette Greet Follow with cardiology, Dr Rance Muir with Dr Matthew Saras Does not participate in [facility], pap or colonoscopy screening at this time.  . Mumps as a child  . Obese   . Ovarian cyst   . Pelvic mass in female   . Peripheral neuropathy 02/26/2012  . Precancerous skin lesion   . Skin cancer    left temple  . Thyroid disease    hypothroidism  . Vitamin D deficiency     Current Outpatient Medications on File Prior to Visit  Medication Sig Dispense Refill  . acetaminophen (TYLENOL) 500 MG tablet Take 3,000 mg by mouth daily.     . Cholecalciferol (VITAMIN D PO) Take 1 capsule by mouth daily.     Marland Kitchen diltiazem (CARDIZEM CD) 360 MG 24 hr capsule Take 1 capsule (360 mg total) by mouth daily. Pt needs appointment for further refills. 90 capsule 0  . Docusate Sodium (PHILLIPS STOOL SOFTENER PO) Take 1 tablet by mouth as needed.     . furosemide (LASIX) 20  MG tablet Take 1 tablet (20 mg total) by mouth as needed. 30 tablet 3  . losartan (COZAAR) 25 MG tablet Take 1 tablet (25 mg total) by mouth every evening. Pt needs appointment for further refills. 90 tablet 0  . PRADAXA 150 MG CAPS capsule TAKE 1 CAPSULE TWICE A DAY 180 capsule 2  . predniSONE (DELTASONE) 10 MG tablet 4 tab po day 1, 3 tab po day 2, 2 tab po day 3, 1 tab po day 4 10 tablet 0  . Probiotic Product (PROBIOTIC DAILY PO) Take 1 tablet by mouth daily.    . ranitidine (ZANTAC) 150 MG tablet Take 1 tablet (150 mg total) by mouth daily as needed for heartburn. 30 tablet 1  . Vitamin D, Ergocalciferol, (DRISDOL) 50000 units CAPS capsule TAKE 1 CAPSULE (50,000 UNITS TOTAL) BY MOUTH ONCE A WEEK. TAKE ONCE A WEEK FOR 12  WEEKS. 12 capsule 1   No current facility-administered medications on file prior to visit.     Past Surgical History:  Procedure Laterality Date  . ABDOMINAL HYSTERECTOMY    . CATARACT EXTRACTION     Dr. Satira Sark  . SKIN BIOPSY     left temple. cancerous  . TOTAL HIP ARTHROPLASTY  2003   left    Allergies  Allergen Reactions  . Amiodarone Other (See Comments)    Amiodarone pulmonary toxicity.  . Aspercreme [Trolamine Salicylate] Other (See Comments)    Pt states it caused redness like it was burning her skin.    Social History   Socioeconomic History  . Marital status: Widowed    Spouse name: Not on file  . Number of children: 53  . Years of education: Not on file  . Highest education level: Not on file  Occupational History  . Occupation: retired    Fish farm manager: RETIRED  Social Needs  . Financial resource strain: Not on file  . Food insecurity:    Worry: Not on file    Inability: Not on file  . Transportation needs:    Medical: Not on file    Non-medical: Not on file  Tobacco Use  . Smoking status: Former Smoker    Packs/day: 0.30    Years: 5.00    Pack years: 1.50    Types: Cigarettes    Last attempt to quit: 10/28/1973    Years since quitting: 44.7  . Smokeless tobacco: Never Used  Substance and Sexual Activity  . Alcohol use: Yes    Comment: socially  . Drug use: No  . Sexual activity: Not on file  Lifestyle  . Physical activity:    Days per week: Not on file    Minutes per session: Not on file  . Stress: Not on file  Relationships  . Social connections:    Talks on phone: Not on file    Gets together: Not on file    Attends religious service: Not on file    Active member of club or organization: Not on file    Attends meetings of clubs or organizations: Not on file    Relationship status: Not on file  . Intimate partner violence:    Fear of current or ex partner: Not on file    Emotionally abused: Not on file    Physically abused: Not on file     Forced sexual activity: Not on file  Other Topics Concern  . Not on file  Social History Narrative  . Not on file    Family History  Problem  Relation Age of Onset  . Stroke Mother   . Prostate cancer Father        prostate  . Hypertension Brother   . Cancer Maternal Grandmother   . Hypertension Brother   . Heart disease Brother   . Obesity Brother   . Hypertension Brother   . Hyperlipidemia Brother   . Heart disease Brother   . Kidney disease Brother   . Cancer Daughter        kidney- spine and blood stream  . Heart disease Son        valvular heart disease  . Prostate cancer Brother     BP 121/82   Pulse 76   Ht 5\' 7"  (1.702 m)   Wt 230 lb (104.3 kg)   LMP 12/25/2011   BMI 36.02 kg/m   Review of Systems: See HPI above.     Objective:  Physical Exam:  GEN: pt appears comfortable  Right hip: No deformity No lateral Tenderness Significantly limited ROM due to pain but 5/5 strength in knee and ankle NV intact Positive logroll  Left hip: No deformity No tenderness Normal ROM with IR/ER 5/5 strength NV intact  Assessment & Plan:  1. Right hip pain - secondary to arthritis. Overall her pain and exam is mildly improved from last visit.  She would like to continue with conservative measures.  Declined intraarticular injection today. - continue tylenol - continue biofreeze - Aleve 2 tablets twice daily - consider PT referral - consider steroid injection - PT will follow up as needed or if she wishes to proceed with steroid injection

## 2018-08-13 ENCOUNTER — Encounter: Payer: Self-pay | Admitting: Family Medicine

## 2018-08-13 ENCOUNTER — Ambulatory Visit (INDEPENDENT_AMBULATORY_CARE_PROVIDER_SITE_OTHER): Payer: Medicare Other | Admitting: Family Medicine

## 2018-08-13 VITALS — BP 155/93 | HR 106 | Ht 67.0 in | Wt 230.0 lb

## 2018-08-13 DIAGNOSIS — M1611 Unilateral primary osteoarthritis, right hip: Secondary | ICD-10-CM

## 2018-08-13 DIAGNOSIS — M25551 Pain in right hip: Secondary | ICD-10-CM | POA: Diagnosis not present

## 2018-08-13 MED ORDER — METHYLPREDNISOLONE ACETATE 40 MG/ML IJ SUSP
40.0000 mg | Freq: Once | INTRAMUSCULAR | Status: AC
  Administered 2018-08-13: 40 mg via INTRA_ARTICULAR

## 2018-08-13 NOTE — Progress Notes (Signed)
PCP: Mosie Lukes, MD  Subjective:   HPI: Patient is a 84 y.o. female here for right hip intra-articular steroid injection for treatment of osteoarthritis.    Past Medical History:  Diagnosis Date  . 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 Anemia   . Apnea, sleep 04/24/2012  . Arthritis    osteo  . Bronchitis 12/25/2011  . Chicken pox as a child  . Chronic atrial fibrillation    a. 07/2010 TEE/DCCV:  EF 60-65% mild MR.; b. Recurrent Afib--> Amiodarone;  Pradaxa (CHA2DS2VASc = 4);  c. Amio d/c'd 2/2 toxicity - now chronic rate-controlled afib.  . Constipation 11/22/2014  . Contusion of leg, left 01/16/2012  . GERD (gastroesophageal reflux disease)   . History of echocardiogram    a. 07/2015 Echo: EF 60-65%, Ao sclerosis, mild MR, mildly dil LA/RV/RA, mild Tr, PASP 26mmHg.  Marland Kitchen Hyperglycemia 01/10/2010   Qualifier: Diagnosis of  By: Niel Hummer MD, [place] Hyperlipidemia   . Hypertension   . Hyperthyroidism 02/09/2009   Qualifier: Diagnosis of  By: Niel Hummer MD, [place] Hypoxia 04/14/2012  . Intermittent claudication ([place]) 05/22/2012  . Measles as a child  . Medicare annual wellness visit, subsequent 11/27/2014   Follows with pulmonology, Dr Gwenette Greet Follow with cardiology, Dr Rance Muir with Dr Matthew Saras Does not participate in [facility], pap or colonoscopy screening at this time.  . Mumps as a child  . Obese   . Ovarian cyst   . Pelvic mass in female   . Peripheral neuropathy 02/26/2012  . Precancerous skin lesion   . Skin cancer    left temple  . Thyroid disease    hypothroidism  . Vitamin D deficiency     Current Outpatient Medications on File Prior to Visit  Medication Sig Dispense Refill  . acetaminophen (TYLENOL) 500 MG tablet Take 3,000 mg by mouth daily.     . Cholecalciferol (VITAMIN D PO) Take 1 capsule by mouth daily.     Marland Kitchen diltiazem (CARDIZEM  CD) 360 MG 24 hr capsule Take 1 capsule (360 mg total) by mouth daily. Pt needs appointment for further refills. 90 capsule 0  . Docusate Sodium (PHILLIPS STOOL SOFTENER PO) Take 1 tablet by mouth as needed.     . furosemide (LASIX) 20 MG tablet Take 1 tablet (20 mg total) by mouth as needed. 30 tablet 3  . losartan (COZAAR) 25 MG tablet Take 1 tablet (25 mg total) by mouth every evening. Pt needs appointment for further refills. 90 tablet 0  . PRADAXA 150 MG CAPS capsule TAKE 1 CAPSULE TWICE A DAY 180 capsule 2  . predniSONE (DELTASONE) 10 MG tablet 4 tab po day 1, 3 tab po day 2, 2 tab po day 3, 1 tab po day 4 10 tablet 0  . Probiotic Product (PROBIOTIC DAILY PO) Take 1 tablet by mouth daily.    . ranitidine (ZANTAC) 150 MG tablet Take 1 tablet (150 mg total) by mouth daily as needed for heartburn. 30 tablet 1  . Vitamin D, Ergocalciferol, (DRISDOL) 50000 units CAPS capsule TAKE 1 CAPSULE (50,000 UNITS TOTAL) BY MOUTH ONCE A WEEK. TAKE ONCE A WEEK FOR 12 WEEKS. 12 capsule 1   No current facility-administered medications on file prior to visit.     Past Surgical History:  Procedure Laterality Date  . ABDOMINAL HYSTERECTOMY    .  CATARACT EXTRACTION     Dr. Satira Sark  . SKIN BIOPSY     left temple. cancerous  . TOTAL HIP ARTHROPLASTY  2003   left    Allergies  Allergen Reactions  . Amiodarone Other (See Comments)    Amiodarone pulmonary toxicity.  . Aspercreme [Trolamine Salicylate] Other (See Comments)    Pt states it caused redness like it was burning her skin.    Social History   Socioeconomic History  . Marital status: Widowed    Spouse name: Not on file  . Number of children: 58  . Years of education: Not on file  . Highest education level: Not on file  Occupational History  . Occupation: retired    Fish farm manager: RETIRED  Social Needs  . Financial resource strain: Not on file  . Food insecurity:    Worry: Not on file    Inability: Not on file  . Transportation needs:     Medical: Not on file    Non-medical: Not on file  Tobacco Use  . Smoking status: Former Smoker    Packs/day: 0.30    Years: 5.00    Pack years: 1.50    Types: Cigarettes    Last attempt to quit: 10/28/1973    Years since quitting: 44.8  . Smokeless tobacco: Never Used  Substance and Sexual Activity  . Alcohol use: Yes    Comment: socially  . Drug use: No  . Sexual activity: Not on file  Lifestyle  . Physical activity:    Days per week: Not on file    Minutes per session: Not on file  . Stress: Not on file  Relationships  . Social connections:    Talks on phone: Not on file    Gets together: Not on file    Attends religious service: Not on file    Active member of club or organization: Not on file    Attends meetings of clubs or organizations: Not on file    Relationship status: Not on file  . Intimate partner violence:    Fear of current or ex partner: Not on file    Emotionally abused: Not on file    Physically abused: Not on file    Forced sexual activity: Not on file  Other Topics Concern  . Not on file  Social History Narrative  . Not on file    Family History  Problem Relation Age of Onset  . Stroke Mother   . Prostate cancer Father        prostate  . Hypertension Brother   . Cancer Maternal Grandmother   . Hypertension Brother   . Heart disease Brother   . Obesity Brother   . Hypertension Brother   . Hyperlipidemia Brother   . Heart disease Brother   . Kidney disease Brother   . Cancer Daughter        kidney- spine and blood stream  . Heart disease Son        valvular heart disease  . Prostate cancer Brother     BP (!) 155/93   Pulse (!) 106   Ht 5\' 7"  (1.702 m)   Wt 230 lb (104.3 kg)   LMP 12/25/2011   BMI 36.02 kg/m   Review of Systems: See HPI above.     Objective:  Physical Exam:  Gen: awake, alert, NAD, comfortable in exam room Pulm: breathing unlabored  Procedure performed: right hip intraarticular corticosteroid injection;  ultrasound guided, in-plane approach  Consent obtained and  verified. Time-out conducted. Noted no overlying erythema, induration, or other signs of local infection. The Right Hip Joint was identified with ultrasound using. The overlying skin was prepped in a sterile fashion. Topical analgesic spray: Ethyl chloride. Joint: Right hip joint Needle: 22ga, 3.5" Completed without difficulty. Meds: depomedrol 40mg , bupivicaine 80mL  Advised to call if fevers/chills, erythema, induration, drainage, or persistent bleeding.   Assessment & Plan:  1.  Right hip pain secondary to osteoarthritis.  Steroid injection performed today as described above.  Patient tolerated procedure well.  May follow-up as needed.

## 2018-08-15 ENCOUNTER — Encounter: Payer: Self-pay | Admitting: Family Medicine

## 2018-09-24 ENCOUNTER — Other Ambulatory Visit: Payer: Self-pay | Admitting: Cardiology

## 2018-10-06 ENCOUNTER — Ambulatory Visit (INDEPENDENT_AMBULATORY_CARE_PROVIDER_SITE_OTHER): Payer: Medicare Other | Admitting: Physician Assistant

## 2018-10-06 ENCOUNTER — Encounter: Payer: Self-pay | Admitting: Physician Assistant

## 2018-10-06 VITALS — BP 118/60 | HR 77 | Ht 67.0 in | Wt 235.8 lb

## 2018-10-06 DIAGNOSIS — G4733 Obstructive sleep apnea (adult) (pediatric): Secondary | ICD-10-CM

## 2018-10-06 DIAGNOSIS — K5900 Constipation, unspecified: Secondary | ICD-10-CM | POA: Diagnosis not present

## 2018-10-06 DIAGNOSIS — Z9989 Dependence on other enabling machines and devices: Secondary | ICD-10-CM | POA: Diagnosis not present

## 2018-10-06 DIAGNOSIS — E785 Hyperlipidemia, unspecified: Secondary | ICD-10-CM

## 2018-10-06 DIAGNOSIS — I4821 Permanent atrial fibrillation: Secondary | ICD-10-CM

## 2018-10-06 DIAGNOSIS — I10 Essential (primary) hypertension: Secondary | ICD-10-CM | POA: Diagnosis not present

## 2018-10-06 DIAGNOSIS — E039 Hypothyroidism, unspecified: Secondary | ICD-10-CM | POA: Diagnosis not present

## 2018-10-06 NOTE — Patient Instructions (Signed)
Medication Instructions:  The current medical regimen is effective;  continue present plan and medications.  If you need a refill on your cardiac medications before your next appointment, please call your pharmacy.   Lab work: None Ordered. If you have labs (blood work) drawn today and your tests are completely normal, you will receive your results only by: Marland Kitchen MyChart Message (if you have MyChart) OR . A paper copy in the mail If you have any lab test that is abnormal or we need to change your treatment, we will call you to review the results.  Testing/Procedures: None Ordered.  Follow-Up: At [facility], you and your health needs are our priority.  As part of our continuing mission to provide you with exceptional heart care, we have created designated Provider Care Teams.  These Care Teams include your primary Cardiologist (physician) and Advanced Practice Providers (APPs -  Physician Assistants and Nurse Practitioners) who all work together to provide you with the care you need, when you need it. You will need a follow up appointment in 1 year.  Please call our office 2 months in advance to schedule this appointment.  You may see Dr.Crenshaw or one of the following Advanced Practice Providers on your designated Care Team:   Kerin Ransom, PA-C Roby Lofts, [place] . Sande Rives, PA-C  Any Other Special Instructions Will Be Listed Below (If Applicable). None

## 2018-10-06 NOTE — Progress Notes (Signed)
Cardiology Office Note    Date:  10/08/2018   ID:  Gina Reilly, DOB Nov 07, 1923, MRN [id number]  PCP:  Mosie Lukes, MD  Cardiologist:  Dr. Stanford Breed   Chief Complaint  Patient presents with  . Follow-up    discuss medications    History of Present Illness:  Gina Reilly is a 84 y.o. female with past medical history of obstructive sleep apnea, GERD, hypertension, hypothyroidism, hyperlipidemia and permanent atrial fibrillation on pradaxa.  She has a history of amiodarone induced lung toxicity and history of bradycardia.  Echocardiogram in October 2016 showed normal LV function, mild LAE, mild MR.  Holter monitor in October 2016 showed rate controlled atrial fibrillation.  Patient was last seen by Dr. Stanford Breed in October 2018, at which time she was doing well.   Patient presents today for cardiology office visit, she denies any recent chest pain or shortness of breath.  She does complain of symptom was restless leg syndrome, I recommend her to discuss this with her primary care doctor.  She also have issues using her CPAP machine.  If she use her CPAP machine for longer than 90 minutes, she has significant sweating around the mask and on her chest.  I am not entirely sure if this is related to the usage of her CPAP machine or not.  She also had question regarding her stool softener.  She says she has constipation on a daily basis unless she take a stool softener, however more recently, she has noticed some diarrhea on the stool softener.  I asked her to decrease the frequency of the stool softener.  Overall, she has no cardiac complaints such as chest pain, SOB, lower extremity edema, orthopnea or PND.   Past Medical History:  Diagnosis Date  . 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 Anemia   . Apnea, sleep 04/24/2012  . Arthritis    osteo  . Bronchitis 12/25/2011  . Chicken  pox as a child  . Chronic atrial fibrillation    a. 07/2010 TEE/DCCV:  EF 60-65% mild MR.; b. Recurrent Afib--> Amiodarone;  Pradaxa (CHA2DS2VASc = 4);  c. Amio d/c'd 2/2 toxicity - now chronic rate-controlled afib.  . Constipation 11/22/2014  . Contusion of leg, left 01/16/2012  . GERD (gastroesophageal reflux disease)   . History of echocardiogram    a. 07/2015 Echo: EF 60-65%, Ao sclerosis, mild MR, mildly dil LA/RV/RA, mild Tr, PASP 3mmHg.  Marland Kitchen Hyperglycemia 01/10/2010   Qualifier: Diagnosis of  By: Niel Hummer MD, [place] Hyperlipidemia   . Hypertension   . Hyperthyroidism 02/09/2009   Qualifier: Diagnosis of  By: Niel Hummer MD, [place] Hypoxia 04/14/2012  . Intermittent claudication ([place]) 05/22/2012  . Measles as a child  . Medicare annual wellness visit, subsequent 11/27/2014   Follows with pulmonology, Dr Gwenette Greet Follow with cardiology, Dr Rance Muir with Dr Matthew Saras Does not participate in [facility], pap or colonoscopy screening at this time.  . Mumps as a child  . Obese   . Ovarian cyst   . Pelvic mass in female   . Peripheral neuropathy 02/26/2012  . Precancerous skin lesion   . Skin cancer    left temple  . Thyroid disease    hypothroidism  . Vitamin D deficiency     Past Surgical History:  Procedure Laterality Date  .  ABDOMINAL HYSTERECTOMY    . CATARACT EXTRACTION     Dr. Satira Sark  . SKIN BIOPSY     left temple. cancerous  . TOTAL HIP ARTHROPLASTY  2003   left    Current Medications: Outpatient Medications Prior to Visit  Medication Sig Dispense Refill  . acetaminophen (TYLENOL) 500 MG tablet Take 3,000 mg by mouth daily.     . Cholecalciferol (VITAMIN D PO) Take 1 capsule by mouth daily.     Marland Kitchen diltiazem (CARDIZEM CD) 360 MG 24 hr capsule TAKE 1 CAPSULE DAILY (NEED APPOINTMENT FOR FURTHER REFILLS) 90 capsule 0  . Docusate Sodium (PHILLIPS STOOL SOFTENER PO) Take 1 tablet by mouth as needed.     . furosemide (LASIX) 20 MG tablet Take 1 tablet (20 mg total)  by mouth as needed. 30 tablet 3  . losartan (COZAAR) 25 MG tablet TAKE 1 TABLET EVERY EVENING (NEED APPOINTMENT FOR FURTHER REFILLS) 90 tablet 0  . PRADAXA 150 MG CAPS capsule TAKE 1 CAPSULE TWICE A DAY 180 capsule 2  . predniSONE (DELTASONE) 10 MG tablet 4 tab po day 1, 3 tab po day 2, 2 tab po day 3, 1 tab po day 4 10 tablet 0  . Probiotic Product (PROBIOTIC DAILY PO) Take 1 tablet by mouth daily.    . ranitidine (ZANTAC) 150 MG tablet Take 1 tablet (150 mg total) by mouth daily as needed for heartburn. 30 tablet 1  . Vitamin D, Ergocalciferol, (DRISDOL) 50000 units CAPS capsule TAKE 1 CAPSULE (50,000 UNITS TOTAL) BY MOUTH ONCE A WEEK. TAKE ONCE A WEEK FOR 12 WEEKS. 12 capsule 1   No facility-administered medications prior to visit.      Allergies:   Amiodarone and Aspercreme [trolamine salicylate]   Social History   Socioeconomic History  . Marital status: Widowed    Spouse name: Not on file  . Number of children: 90  . Years of education: Not on file  . Highest education level: Not on file  Occupational History  . Occupation: retired    Fish farm manager: RETIRED  Social Needs  . Financial resource strain: Not on file  . Food insecurity:    Worry: Not on file    Inability: Not on file  . Transportation needs:    Medical: Not on file    Non-medical: Not on file  Tobacco Use  . Smoking status: Former Smoker    Packs/day: 0.30    Years: 5.00    Pack years: 1.50    Types: Cigarettes    Last attempt to quit: 10/28/1973    Years since quitting: 44.9  . Smokeless tobacco: Never Used  Substance and Sexual Activity  . Alcohol use: Yes    Comment: socially  . Drug use: No  . Sexual activity: Not on file  Lifestyle  . Physical activity:    Days per week: Not on file    Minutes per session: Not on file  . Stress: Not on file  Relationships  . Social connections:    Talks on phone: Not on file    Gets together: Not on file    Attends religious service: Not on file    Active member  of club or organization: Not on file    Attends meetings of clubs or organizations: Not on file    Relationship status: Not on file  Other Topics Concern  . Not on file  Social History Narrative  . Not on file     Family History:  The patient's  family history includes Cancer in her daughter and maternal grandmother; Heart disease in her brother, brother, and son; Hyperlipidemia in her brother; Hypertension in her brother, brother, and brother; Kidney disease in her brother; Obesity in her brother; Prostate cancer in her brother and father; Stroke in her mother.   ROS:   Please see the history of present illness.    ROS All other systems reviewed and are negative.   PHYSICAL EXAM:   VS:  BP 118/60 (BP Location: Left Arm)   Pulse 77   Ht 5\' 7"  (1.702 m)   Wt 235 lb 12.8 oz (107 kg)   LMP 12/25/2011   SpO2 97%   BMI 36.93 kg/m    GEN: Well nourished, well developed, in no acute distress  HEENT: normal  Neck: no JVD, carotid bruits, or masses Cardiac: irregularly irregular; no murmurs, rubs, or gallops,no edema  Respiratory:  clear to auscultation bilaterally, normal work of breathing GI: soft, nontender, nondistended, + BS MS: no deformity or atrophy  Skin: warm and dry, no rash Neuro:  Alert and Oriented x 3, Strength and sensation are intact Psych: euthymic mood, full affect  Wt Readings from Last 3 Encounters:  10/06/18 235 lb 12.8 oz (107 kg)  08/13/18 230 lb (104.3 kg)  07/20/18 230 lb (104.3 kg)      Studies/Labs Reviewed:   EKG:  EKG is ordered today.  The ekg ordered today demonstrates atrial fibrillation with HR 78  Recent Labs: 11/11/2017: Magnesium 2.1 02/12/2018: ALT 10; BUN 24; Creat 0.98; Potassium 4.1; Sodium 140; TSH 2.03 06/08/2018: Hemoglobin 13.9; Platelets 249.0   Lipid Panel    Component Value Date/Time   CHOL 163 02/12/2018 1538   TRIG 129 02/12/2018 1538   HDL 65 02/12/2018 1538   CHOLHDL 2.5 02/12/2018 1538   VLDL 25.8 11/11/2017 1556    LDLCALC 77 02/12/2018 1538   LDLDIRECT 128.4 02/09/2009 0924    Additional studies/ records that were reviewed today include:   Echo 08/22/2015 LV EF: 60% -   65% Study Conclusions  - Left ventricle: The cavity size was normal. There was moderate   focal basal hypertrophy of the septum. Systolic function was   normal. The estimated ejection fraction was in the range of 60%   to 65%. Wall motion was normal; there were no regional wall   motion abnormalities. The study is not technically sufficient to   allow evaluation of LV diastolic function. - Aortic valve: Trileaflet. Sclerosis without stenosis. There was   no regurgitation. - Mitral valve: Calcified annulus. There was mild regurgitation. - Left atrium: The atrium was mildly dilated. - Right ventricle: The cavity size was mildly dilated. - Right atrium: The atrium was mildly dilated. - Tricuspid valve: There was mild regurgitation. - Pulmonary arteries: PA peak pressure: 42 mm Hg (S). - Inferior vena cava: The vessel was normal in size. The   respirophasic diameter changes were in the normal range (= 50%),   consistent with normal central venous pressure.  Impressions:  - Compared to the prior echo in 2013, the EF is higher at 60-65%,   a-fib is present and there is mild biatrial enlargement, mild TR,   rVSP 42 mmHg.    ASSESSMENT:    1. Permanent atrial fibrillation   2. Essential hypertension   3. Hyperlipidemia, unspecified hyperlipidemia type   4. Hypothyroidism, unspecified type   5. OSA on CPAP   6. Constipation, unspecified constipation type      PLAN:  In order  of problems listed above:  1. Permanent atrial fibrillation: Rate controlled using diltiazem, anticoagulated using Pradaxa.  She has been compliant with her medication.  Continue on current therapy  2. Hypertension: Blood pressure stable on current visit.  3. Hyperlipidemia: Currently not on statin.  Will defer any lipid panel to primary care  provider  4. Hypothyroidism: Managed by primary care provider.  5. Obstructive sleep apnea: She can only use her CPAP machine for roughly 90 minutes each time.  Otherwise she developed significant swelling around the mask and on her chest.  6. Constipation: She is on a stool softener, more recently she has noticed diarrhea on the stool softener, however if she completely come off of the stool softener, she would have significant diarrhea.  I recommended for her to decrease the frequency of her stool softener.    Medication Adjustments/Labs and Tests Ordered: Current medicines are reviewed at length with the patient today.  Concerns regarding medicines are outlined above.  Medication changes, Labs and Tests ordered today are listed in the Patient Instructions below. Patient Instructions  Medication Instructions:  The current medical regimen is effective;  continue present plan and medications.  If you need a refill on your cardiac medications before your next appointment, please call your pharmacy.   Lab work: None Ordered. If you have labs (blood work) drawn today and your tests are completely normal, you will receive your results only by: Marland Kitchen MyChart Message (if you have MyChart) OR . A paper copy in the mail If you have any lab test that is abnormal or we need to change your treatment, we will call you to review the results.  Testing/Procedures: None Ordered.  Follow-Up: At [facility], you and your health needs are our priority.  As part of our continuing mission to provide you with exceptional heart care, we have created designated Provider Care Teams.  These Care Teams include your primary Cardiologist (physician) and Advanced Practice Providers (APPs -  Physician Assistants and Nurse Practitioners) who all work together to provide you with the care you need, when you need it. You will need a follow up appointment in 1 year.  Please call our office 2 months in advance to schedule  this appointment.  You may see Dr.Crenshaw or one of the following Advanced Practice Providers on your designated Care Team:   Kerin Ransom, PA-C Roby Lofts, [place] . Sande Rives, PA-C  Any Other Special Instructions Will Be Listed Below (If Applicable). None      Hilbert Corrigan, [place]  10/08/2018 11:51 PM    [facility] [address], [place], [place]  [address] Phone: [phone]; Fax: [phone]

## 2018-10-08 ENCOUNTER — Encounter: Payer: Self-pay | Admitting: Physician Assistant

## 2018-11-09 NOTE — Progress Notes (Deleted)
Subjective:   Gina Reilly is a 85 y.o. female who presents for Medicare Annual (Subsequent) preventive examination.  Review of Systems: No ROS.  Medicare Wellness Visit. Additional risk factors are reflected in the social history.   Sleep patterns: Home Safety/Smoke Alarms: Feels safe in home. Smoke alarms in place.    Female:      Mammo- No longer doing routine screening due to age.      Dexa scan-   No longer doing routine screening due to age.      Objective:     Vitals: LMP 12/25/2011   There is no height or weight on file to calculate BMI.  Advanced Directives 09/09/2017 09/05/2016 09/25/2014  Does Patient Have a Medical Advance Directive? Yes Yes No  Type of Paramedic of [place];Living will [place];Living will -  Does patient want to make changes to medical advance directive? - No - Patient declined -  Copy of [place] in Chart? Yes Yes -    Tobacco Social History   Tobacco Use  Smoking Status Former Smoker  . Packs/day: 0.30  . Years: 5.00  . Pack years: 1.50  . Types: Cigarettes  . Last attempt to quit: 10/28/1973  . Years since quitting: 45.0  Smokeless Tobacco Never Used     Counseling given: Not Answered   Clinical Intake:                       Past Medical History:  Diagnosis Date  . 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 Anemia   . Apnea, sleep 04/24/2012  . Arthritis    osteo  . Bronchitis 12/25/2011  . Chicken pox as a child  . Chronic atrial fibrillation    a. 07/2010 TEE/DCCV:  EF 60-65% mild MR.; b. Recurrent Afib--> Amiodarone;  Pradaxa (CHA2DS2VASc = 4);  c. Amio d/c'd 2/2 toxicity - now chronic rate-controlled afib.  . Constipation 11/22/2014  . Contusion of leg, left 01/16/2012  . GERD (gastroesophageal reflux disease)   . History of echocardiogram     a. 07/2015 Echo: EF 60-65%, Ao sclerosis, mild MR, mildly dil LA/RV/RA, mild Tr, PASP 62mmHg.  Marland Kitchen Hyperglycemia 01/10/2010   Qualifier: Diagnosis of  By: Niel Hummer MD, [place] Hyperlipidemia   . Hypertension   . Hyperthyroidism 02/09/2009   Qualifier: Diagnosis of  By: Niel Hummer MD, [place] Hypoxia 04/14/2012  . Intermittent claudication ([place]) 05/22/2012  . Measles as a child  . Medicare annual wellness visit, subsequent 11/27/2014   Follows with pulmonology, Dr Gwenette Greet Follow with cardiology, Dr Rance Muir with Dr Matthew Saras Does not participate in [facility], pap or colonoscopy screening at this time.  . Mumps as a child  . Obese   . Ovarian cyst   . Pelvic mass in female   . Peripheral neuropathy 02/26/2012  . Precancerous skin lesion   . Skin cancer    left temple  . Thyroid disease    hypothroidism  . Vitamin D deficiency    Past Surgical History:  Procedure Laterality Date  . ABDOMINAL HYSTERECTOMY    . CATARACT EXTRACTION     Dr. Satira Sark  . SKIN BIOPSY     left temple. cancerous  . TOTAL HIP ARTHROPLASTY  2003   left   Family History  Problem Relation Age of Onset  . Stroke Mother   . Prostate cancer Father        prostate  . Hypertension Brother   . Cancer Maternal Grandmother   . Hypertension Brother   . Heart disease Brother   . Obesity Brother   . Hypertension Brother   . Hyperlipidemia Brother   . Heart disease Brother   . Kidney disease Brother   . Cancer Daughter        kidney- spine and blood stream  . Heart disease Son        valvular heart disease  . Prostate cancer Brother    Social History   Socioeconomic History  . Marital status: Widowed    Spouse name: Not on file  . Number of children: 72  . Years of education: Not on file  . Highest education level: Not on file  Occupational History  . Occupation: retired    Fish farm manager: RETIRED  Social Needs  . Financial resource strain: Not on file  . Food insecurity:    Worry: Not on file      Inability: Not on file  . Transportation needs:    Medical: Not on file    Non-medical: Not on file  Tobacco Use  . Smoking status: Former Smoker    Packs/day: 0.30    Years: 5.00    Pack years: 1.50    Types: Cigarettes    Last attempt to quit: 10/28/1973    Years since quitting: 45.0  . Smokeless tobacco: Never Used  Substance and Sexual Activity  . Alcohol use: Yes    Comment: socially  . Drug use: No  . Sexual activity: Not on file  Lifestyle  . Physical activity:    Days per week: Not on file    Minutes per session: Not on file  . Stress: Not on file  Relationships  . Social connections:    Talks on phone: Not on file    Gets together: Not on file    Attends religious service: Not on file    Active member of club or organization: Not on file    Attends meetings of clubs or organizations: Not on file    Relationship status: Not on file  Other Topics Concern  . Not on file  Social History Narrative  . Not on file    Outpatient Encounter Medications as of 11/10/2018  Medication Sig  . acetaminophen (TYLENOL) 500 MG tablet Take 3,000 mg by mouth daily.   . Cholecalciferol (VITAMIN D PO) Take 1 capsule by mouth daily.   Marland Kitchen diltiazem (CARDIZEM CD) 360 MG 24 hr capsule TAKE 1 CAPSULE DAILY (NEED APPOINTMENT FOR FURTHER REFILLS)  . Docusate Sodium (PHILLIPS STOOL SOFTENER PO) Take 1 tablet by mouth as needed.   . furosemide (LASIX) 20 MG tablet Take 1 tablet (20 mg total) by mouth as needed.  Marland Kitchen losartan (COZAAR) 25 MG tablet TAKE 1 TABLET EVERY EVENING (NEED APPOINTMENT FOR FURTHER REFILLS)  . PRADAXA 150 MG CAPS capsule TAKE 1 CAPSULE TWICE A DAY  . predniSONE (DELTASONE) 10 MG tablet 4 tab po day 1, 3 tab po day 2, 2 tab po day 3, 1 tab po day 4  . Probiotic Product (PROBIOTIC DAILY PO) Take 1 tablet by mouth daily.  . ranitidine (ZANTAC) 150 MG tablet Take 1 tablet (150 mg total) by mouth daily as needed for heartburn.  . Vitamin D, Ergocalciferol, (DRISDOL) 50000  units CAPS capsule TAKE 1 CAPSULE (50,000 UNITS  TOTAL) BY MOUTH ONCE A WEEK. TAKE ONCE A WEEK FOR 12 WEEKS.   No facility-administered encounter medications on file as of 11/10/2018.     Activities of Daily Living No flowsheet data found.  Patient Care Team: Mosie Lukes, MD as PCP - General (Family Medicine) Ralene Bathe, MD as Consulting Physician (Ophthalmology) Stanford Breed Denice Bors, MD as Consulting Physician (Cardiology)    Assessment:   This is a routine wellness examination for [place]. Physical assessment deferred to PCP.  Exercise Activities and Dietary recommendations   Diet (meal preparation, eat out, water intake, caffeinated beverages, dairy products, fruits and vegetables): {Desc; diets:16563} Breakfast: Lunch:  Dinner:      Goals    . Increase physical activity     Begin chair exercises and increase as tolerated.     . Increase water intake    . Maintain independence       Fall Risk Fall Risk  09/09/2017 09/05/2016 01/25/2016 11/22/2014 11/22/2014  Falls in the past year? Yes No No No No  Number falls in past yr: 1 - - - -  Injury with Fall? Yes - - - -  Follow up Education provided;Falls prevention discussed - - - -    Depression Screen PHQ 2/9 Scores 09/09/2017 09/05/2016 01/25/2016 11/22/2014  PHQ - 2 Score 0 0 0 0     Cognitive Function MMSE - Mini Mental State Exam 09/09/2017 09/05/2016  Orientation to time 5 5  Orientation to Place 5 5  Registration 3 3  Attention/ Calculation 5 5  Recall 3 3  Language- name 2 objects 2 2  Language- repeat 1 1  Language- follow 3 step command 3 3  Language- read & follow direction 1 1  Write a sentence 1 1  Copy design 1 1  Total score 30 30        Immunization History  Administered Date(s) Administered  . Pneumococcal Polysaccharide-23 01/25/2016   Screening Tests Health Maintenance  Topic Date Due  . TETANUS/TDAP  12/23/1944  . DEXA SCAN  12/23/1990  . PNA vac Low Risk Adult (2 of 2 - PCV13)  01/24/2017  . INFLUENZA VACCINE  05/28/2018      Plan:   ***   I have personally reviewed and noted the following in the patient's chart:   . Medical and social history . Use of alcohol, tobacco or illicit drugs  . Current medications and supplements . Functional ability and status . Nutritional status . Physical activity . Advanced directives . List of other physicians . Hospitalizations, surgeries, and ER visits in previous 12 months . Vitals . Screenings to include cognitive, depression, and falls . Referrals and appointments  In addition, I have reviewed and discussed with patient certain preventive protocols, quality metrics, and best practice recommendations. A written personalized care plan for preventive services as well as general preventive health recommendations were provided to patient.     Shela Nevin, [place]  11/09/2018

## 2018-11-10 ENCOUNTER — Ambulatory Visit: Payer: Medicare Other | Admitting: *Deleted

## 2018-12-04 ENCOUNTER — Telehealth: Payer: Self-pay | Admitting: Pharmacist Clinician (PhC)/ Clinical Pharmacy Specialist

## 2018-12-04 MED ORDER — APIXABAN 5 MG PO TABS: 5.0000 mg | ORAL_TABLET | Freq: Two times a day (BID) | ORAL | 1 refills | Status: DC

## 2018-12-04 NOTE — Telephone Encounter (Signed)
Insurance requires switch from Pradaxa to Eliquis or Xarelto.  Per chart patient 85 yrs old, weighs 107 kg, lates SCr (April 2019) 0.98.  Based on this, will start her on Eliquis 5 mg bid.

## 2018-12-09 ENCOUNTER — Telehealth: Payer: Self-pay | Admitting: Cardiology

## 2018-12-09 MED ORDER — APIXABAN 5 MG PO TABS: 5.0000 mg | ORAL_TABLET | Freq: Two times a day (BID) | ORAL | 1 refills | Status: DC

## 2018-12-09 NOTE — Telephone Encounter (Signed)
Returned call to pt. Pt states she [facility] today about her prescription changed from Pradaxa to Eliquis. Informed pt that a new prescription was sent to them on 2/7. Pt states she will call them back to inform them.

## 2018-12-09 NOTE — Telephone Encounter (Signed)
Pt called back stating [facility] spoke with [facility] who states the Eliquis prescription needs to be sent to a local pharmacy first and approved through united healthcare. Pharm D made aware and new Rx sent.

## 2018-12-09 NOTE — Telephone Encounter (Signed)
New message;   Patient would like for some one to call [facility] concering her medication. [phone] or [phone].

## 2018-12-09 NOTE — Addendum Note (Signed)
Addended by: Meryl Crutch on: 12/09/2018 01:20 PM   Modules accepted: Orders

## 2018-12-09 NOTE — Telephone Encounter (Signed)
Left message to call back  

## 2018-12-23 ENCOUNTER — Other Ambulatory Visit: Payer: Self-pay | Admitting: Cardiology

## 2019-01-12 ENCOUNTER — Ambulatory Visit: Payer: Medicare Other | Admitting: Family Medicine

## 2019-05-21 ENCOUNTER — Other Ambulatory Visit: Payer: Self-pay | Admitting: Cardiology

## 2019-05-21 DIAGNOSIS — I480 Paroxysmal atrial fibrillation: Secondary | ICD-10-CM

## 2019-05-24 ENCOUNTER — Other Ambulatory Visit: Payer: Self-pay

## 2019-05-24 DIAGNOSIS — I480 Paroxysmal atrial fibrillation: Secondary | ICD-10-CM

## 2019-05-24 NOTE — Telephone Encounter (Signed)
85yo DR Stanford Breed  107kg Scr = 0.98 on 02/12/2018 (>39yr ago). Awaiting repeat blood work)

## 2019-05-25 ENCOUNTER — Encounter: Payer: Self-pay | Admitting: *Deleted

## 2019-05-25 LAB — CBC
Hematocrit: 41.6 % (ref 34.0–46.6)
Hemoglobin: 14 g/dL (ref 11.1–15.9)
MCH: 33.3 pg — ABNORMAL HIGH (ref 26.6–33.0)
MCHC: 33.7 g/dL (ref 31.5–35.7)
MCV: 99 fL — ABNORMAL HIGH (ref 79–97)
Platelets: 213 10*3/uL (ref 150–450)
RBC: 4.21 x10E6/uL (ref 3.77–5.28)
RDW: 12.8 % (ref 11.7–15.4)
WBC: 6.3 10*3/uL (ref 3.4–10.8)

## 2019-05-25 LAB — BASIC METABOLIC PANEL
BUN/Creatinine Ratio: 21 (ref 12–28)
BUN: 23 mg/dL (ref 10–36)
CO2: 20 mmol/L (ref 20–29)
Calcium: 9.4 mg/dL (ref 8.7–10.3)
Chloride: 103 mmol/L (ref 96–106)
Creatinine, Ser: 1.09 mg/dL — ABNORMAL HIGH (ref 0.57–1.00)
GFR calc Af Amer: 51 mL/min/{1.73_m2} — ABNORMAL LOW (ref >59)
GFR calc non Af Amer: 44 mL/min/{1.73_m2} — ABNORMAL LOW (ref >59)
Glucose: 103 mg/dL — ABNORMAL HIGH (ref 65–99)
Potassium: 4.5 mmol/L (ref 3.5–5.2)
Sodium: 139 mmol/L (ref 134–144)

## 2019-05-25 NOTE — Telephone Encounter (Signed)
89 F, 107 kg, SCr 1.09 on 05/24/2019.  Continue with Eliquis 5 mg bid

## 2019-05-28 ENCOUNTER — Other Ambulatory Visit: Payer: Self-pay

## 2019-10-06 NOTE — Progress Notes (Signed)
HPI: FU atrial fibrillation. Patient has a history of amiodarone lung toxicity as well; h/o bradycardia. Echo 10/16 showed normal LV function, mild LAE, RAE,RVE, mild MR. Holter monitor 10/16 showed afib rate controlled. Since she was last seen, patient notes dyspnea on exertion.  Occasional minimal pedal edema.  No exertional chest pain, palpitations or syncope.  No bleeding or falls.  Current Outpatient Medications  Medication Sig Dispense Refill  . acetaminophen (TYLENOL) 500 MG tablet Take 3,000 mg by mouth daily.     . Cholecalciferol (VITAMIN D PO) Take 1 capsule by mouth daily.     Marland Kitchen diltiazem (CARDIZEM CD) 360 MG 24 hr capsule TAKE 1 CAPSULE DAILY (NEED APPOINTMENT FOR FURTHER REFILLS) 90 capsule 4  . Docusate Sodium (PHILLIPS STOOL SOFTENER PO) Take 1 tablet by mouth as needed.     Marland Kitchen ELIQUIS 5 MG TABS tablet TAKE 1 TABLET TWICE A DAY 180 tablet 1  . furosemide (LASIX) 20 MG tablet Take 1 tablet (20 mg total) by mouth as needed. 30 tablet 3  . losartan (COZAAR) 25 MG tablet TAKE 1 TABLET EVERY EVENING (NEED APPOINTMENT FOR FURTHER REFILLS) 90 tablet 4  . Probiotic Product (PROBIOTIC DAILY PO) Take 1 tablet by mouth daily.    . ranitidine (ZANTAC) 150 MG tablet Take 1 tablet (150 mg total) by mouth daily as needed for heartburn. 30 tablet 1  . Vitamin D, Ergocalciferol, (DRISDOL) 50000 units CAPS capsule TAKE 1 CAPSULE (50,000 UNITS TOTAL) BY MOUTH ONCE A WEEK. TAKE ONCE A WEEK FOR 12 WEEKS. 12 capsule 1  . predniSONE (DELTASONE) 10 MG tablet 4 tab po day 1, 3 tab po day 2, 2 tab po day 3, 1 tab po day 4 (Patient not taking: Reported on 10/11/2019) 10 tablet 0   No current facility-administered medications for this visit.     Past Medical History:  Diagnosis Date  . 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 Anemia   . Apnea, sleep 04/24/2012  . Arthritis    osteo  .  Bronchitis 12/25/2011  . Chicken pox as a child  . Chronic atrial fibrillation ([place])    a. 07/2010 TEE/DCCV:  EF 60-65% mild MR.; b. Recurrent Afib--> Amiodarone;  Pradaxa (CHA2DS2VASc = 4);  c. Amio d/c'd 2/2 toxicity - now chronic rate-controlled afib.  . Constipation 11/22/2014  . Contusion of leg, left 01/16/2012  . GERD (gastroesophageal reflux disease)   . History of echocardiogram    a. 07/2015 Echo: EF 60-65%, Ao sclerosis, mild MR, mildly dil LA/RV/RA, mild Tr, PASP 47mmHg.  Marland Kitchen Hyperglycemia 01/10/2010   Qualifier: Diagnosis of  By: Niel Hummer MD, [place] Hyperlipidemia   . Hypertension   . Hyperthyroidism 02/09/2009   Qualifier: Diagnosis of  By: Niel Hummer MD, [place] Hypoxia 04/14/2012  . Intermittent claudication ([place]) 05/22/2012  . Measles as a child  . Medicare annual wellness visit, subsequent 11/27/2014   Follows with pulmonology, Dr Gwenette Greet Follow with cardiology, Dr Rance Muir with Dr Matthew Saras Does not participate in [facility], pap or colonoscopy screening at this time.  . Mumps as a child  . Obese   . Ovarian cyst   . Pelvic mass in female   . Peripheral neuropathy 02/26/2012  . Precancerous skin lesion   . Skin cancer    left temple  . Thyroid disease  hypothroidism  . Vitamin D deficiency     Past Surgical History:  Procedure Laterality Date  . ABDOMINAL HYSTERECTOMY    . CATARACT EXTRACTION     Dr. Satira Sark  . SKIN BIOPSY     left temple. cancerous  . TOTAL HIP ARTHROPLASTY  2003   left    Social History   Socioeconomic History  . Marital status: Widowed    Spouse name: Not on file  . Number of children: 4  . Years of education: Not on file  . Highest education level: Not on file  Occupational History  . Occupation: retired    Fish farm manager: RETIRED  Tobacco Use  . Smoking status: Former Smoker    Packs/day: 0.30    Years: 5.00    Pack years: 1.50    Types: Cigarettes    Quit date: 10/28/1973    Years since quitting: 45.9  . Smokeless  tobacco: Never Used  Substance and Sexual Activity  . Alcohol use: Yes    Comment: socially  . Drug use: No  . Sexual activity: Not on file  Other Topics Concern  . Not on file  Social History Narrative  . Not on file   Social Determinants of Health   Financial Resource Strain:   . Difficulty of Paying Living Expenses: Not on file  Food Insecurity:   . Worried About Charity fundraiser in the Last Year: Not on file  . Ran Out of Food in the Last Year: Not on file  Transportation Needs:   . Lack of Transportation (Medical): Not on file  . Lack of Transportation (Non-Medical): Not on file  Physical Activity:   . Days of Exercise per Week: Not on file  . Minutes of Exercise per Session: Not on file  Stress:   . Feeling of Stress : Not on file  Social Connections:   . Frequency of Communication with Friends and Family: Not on file  . Frequency of Social Gatherings with Friends and Family: Not on file  . Attends Religious Services: Not on file  . Active Member of Clubs or Organizations: Not on file  . Attends Archivist Meetings: Not on file  . Marital Status: Not on file  Intimate Partner Violence:   . Fear of Current or Ex-Partner: Not on file  . Emotionally Abused: Not on file  . Physically Abused: Not on file  . Sexually Abused: Not on file    Family History  Problem Relation Age of Onset  . Stroke Mother   . Prostate cancer Father        prostate  . Hypertension Brother   . Cancer Maternal Grandmother   . Hypertension Brother   . Heart disease Brother   . Obesity Brother   . Hypertension Brother   . Hyperlipidemia Brother   . Heart disease Brother   . Kidney disease Brother   . Cancer Daughter        kidney- spine and blood stream  . Heart disease Son        valvular heart disease  . Prostate cancer Brother     ROS: no fevers or chills, productive cough, hemoptysis, dysphasia, odynophagia, melena, hematochezia, dysuria, hematuria, rash, seizure  activity, orthopnea, PND, pedal edema, claudication. Remaining systems are negative.  Physical Exam: Well-developed well-nourished in no acute distress.  Skin is warm and dry.  HEENT is normal.  Neck is supple.  Chest is clear to auscultation with normal expansion.  Cardiovascular exam is irregular  Abdominal exam nontender or distended. No masses palpated. Extremities show no edema; varicosities noted. neuro grossly intact  ECG-atrial fibrillation at a rate of 80, occasional PVC or aberrantly conducted beat, no ST changes.  Cannot rule out prior anterior infarct.  Personally reviewed  A/P  1 permanent atrial fibrillation-plan to continue Cardizem for rate control. Continue apixaban. Given dyspnea on exertion we will repeat echocardiogram.  2 hypertension-blood pressure controlled.  Continue present medications and follow.  3 history of obstructive sleep apnea-patient is compliant with CPAP.  Kirk Ruths, MD

## 2019-10-11 ENCOUNTER — Telehealth: Payer: Self-pay

## 2019-10-11 ENCOUNTER — Ambulatory Visit (INDEPENDENT_AMBULATORY_CARE_PROVIDER_SITE_OTHER): Payer: Medicare Other | Admitting: Cardiology

## 2019-10-11 ENCOUNTER — Other Ambulatory Visit: Payer: Self-pay

## 2019-10-11 ENCOUNTER — Encounter: Payer: Self-pay | Admitting: Cardiology

## 2019-10-11 VITALS — BP 133/85 | HR 80 | Temp 96.0°F | Ht 67.0 in | Wt 235.0 lb

## 2019-10-11 DIAGNOSIS — I4821 Permanent atrial fibrillation: Secondary | ICD-10-CM | POA: Diagnosis not present

## 2019-10-11 DIAGNOSIS — I10 Essential (primary) hypertension: Secondary | ICD-10-CM

## 2019-10-11 DIAGNOSIS — G4733 Obstructive sleep apnea (adult) (pediatric): Secondary | ICD-10-CM | POA: Diagnosis not present

## 2019-10-11 NOTE — Patient Instructions (Signed)
Medication Instructions:  NO CHANGE *If you need a refill on your cardiac medications before your next appointment, please call your pharmacy*  Lab Work: If you have labs (blood work) drawn today and your tests are completely normal, you will receive your results only by: Marland Kitchen MyChart Message (if you have MyChart) OR . A paper copy in the mail If you have any lab test that is abnormal or we need to change your treatment, we will call you to review the results.  Testing/Procedures: Your physician has requested that you have an echocardiogram. Echocardiography is a painless test that uses sound waves to create images of your heart. It provides your doctor with information about the size and shape of your heart and how well your heart's chambers and valves are working. This procedure takes approximately one hour. There are no restrictions for this procedure.AT THE [facility]    Follow-Up: At [facility], you and your health needs are our priority.  As part of our continuing mission to provide you with exceptional heart care, we have created designated Provider Care Teams.  These Care Teams include your primary Cardiologist (physician) and Advanced Practice Providers (APPs -  Physician Assistants and Nurse Practitioners) who all work together to provide you with the care you need, when you need it.  Your next appointment:   12 month(s)  The format for your next appointment:   Either In Person or Virtual  Provider:   You may see Kirk Ruths MD or one of the following Advanced Practice Providers on your designated Care Team:    Kerin Ransom, PA-C  [place], [place]  Coletta Memos, [place]

## 2019-10-11 NOTE — Telephone Encounter (Signed)
Please advise 

## 2019-10-11 NOTE — Telephone Encounter (Signed)
Set her up with phone visit  If nothing else available could do 11:40 on Wed

## 2019-10-11 NOTE — Telephone Encounter (Signed)
Copied from [place] [id number]. Topic: General - Other >> Oct 11, 2019  4:06 PM Antonieta Iba C wrote: Reason for CRM: pt's daughter called in for assistance. She said that pt is scheduled to see PCP in Feb. Meanwhile pt is experiencing restless leg syndrome which is causing pt to have a hard time sleeping.  she would like to know if possible, could PCP have a phone visit with pt sooner?   CB: [phone]

## 2019-10-12 NOTE — Telephone Encounter (Signed)
Called and phone hung up

## 2019-10-12 NOTE — Telephone Encounter (Signed)
LM for pt to call and schedule virtual visit (see below)

## 2019-10-13 ENCOUNTER — Ambulatory Visit (INDEPENDENT_AMBULATORY_CARE_PROVIDER_SITE_OTHER): Payer: Medicare Other | Admitting: Medical

## 2019-10-13 ENCOUNTER — Other Ambulatory Visit: Payer: Self-pay

## 2019-10-13 ENCOUNTER — Telehealth: Payer: Self-pay | Admitting: *Deleted

## 2019-10-13 DIAGNOSIS — G2581 Restless legs syndrome: Secondary | ICD-10-CM | POA: Diagnosis not present

## 2019-10-13 MED ORDER — ROPINIROLE HCL 0.5 MG PO TABS: 0.5000 mg | ORAL_TABLET | Freq: Every day | ORAL | 0 refills | Status: DC

## 2019-10-13 NOTE — Telephone Encounter (Signed)
Copied from [place] [id number]. Topic: General - Other >> Oct 13, 2019  8:20 AM Leward Quan A wrote: Reason for CRM: Patient grand daughter called to schedule an appointment for her for restless legs and pain. First available was 10/28/2019 need a virtual visit would like something earlier please. Call Ph# [phone]

## 2019-10-13 NOTE — Patient Instructions (Addendum)
You describe restless leg type symptoms.  We had discussion on potential conservative measures that she could try.  You noticed that when you elevate your legs that they feel better did recommend that you use some pillows underneath your feet at night and see if they get you some relief.  Also will prescribe Requip 0.5 mg dose to take 1 tablet at night.  Hopefully will start to see some improvement of your symptoms.  Asked that you give [place] an update by Friday afternoon or Monday morning as to how you are doing.  Consider incremental increase of dose if needed.  Follow-up this Friday or upcoming Monday by phone update.

## 2019-10-13 NOTE — Progress Notes (Signed)
Subjective:    Patient ID: Gina Reilly, female    DOB: 03/04/1924, 85 y.o.   MRN: [id number]  HPI  Virtual Visit via Telephone Note  I connected with Gina Reilly on 10/13/19 at  2:00 PM EST by telephone and verified that I am speaking with the correct person using two identifiers.  Location: Patient: home Provider: office   I discussed the limitations, risks, security and privacy concerns of performing an evaluation and management service by telephone and the availability of in person appointments. I also discussed with the patient that there may be a patient responsible charge related to this service. The patient expressed understanding and agreed to proceed.   Vitals not taken today. No bp cuff at home.  History of Present Illness:  Pt states her legs feel restless at night and she can't sleep. She states can't sleep when legs restless and has to pace the floor. She states has felt restless legs for 2 years. Most recently feels worse. No muscle spasms or cramps. Describes that the less sleep she gets restless sensation in legs feel worse and she describes cycle type pattern.  Pt states just saw her cardiologist ruled out cardio med side effects.  If she is very fatigued will get worse.   After a nap legs felt better.     Observations/Objective: General- no acute distress. Pleasant, alert, oriented and normal speech.  Assessment and Plan: You describe restless leg type symptoms.  We had discussion on potential conservative measures that she could try.  You noticed that when you elevate your legs that they feel better did recommend that you use some pillows underneath your feet at night and see if they get you some relief.  Also will prescribe Requip 0.5 mg dose to take 1 tablet at night.  Hopefully will start to see some improvement of your symptoms.  Asked that you give [place] an update by Friday afternoon or Monday morning as to how you are doing.  Consider incremental increase  of dose if needed.  Follow-up this Friday or upcoming Monday by phone update.  Follow Up Instructions:    I discussed the assessment and treatment plan with the patient. The patient was provided an opportunity to ask questions and all were answered. The patient agreed with the plan and demonstrated an understanding of the instructions.   The patient was advised to call back or seek an in-person evaluation if the symptoms worsen or if the condition fails to improve as anticipated.  I provided 25 minutes of non-face-to-face time during this encounter. 50% of time spent couneling on plan going forward. Also answered pt questions.   Mackie Pai, PA-C    Review of Systems  Constitutional: Negative for chills, fatigue and fever.  Respiratory: Negative for cough, chest tightness and wheezing.   Cardiovascular: Negative for chest pain and palpitations.  Gastrointestinal: Negative for abdominal pain.  Musculoskeletal: Negative for back pain.  Neurological:       See hpi.  Hematological: Negative for adenopathy. Does not bruise/bleed easily.  Psychiatric/Behavioral: Negative for behavioral problems and decreased concentration.   Past Medical History:  Diagnosis Date  . 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 Anemia   . Apnea, sleep 04/24/2012  . Arthritis    osteo  . Bronchitis 12/25/2011  . Chicken pox as a child  . Chronic atrial fibrillation ([facility])  a. 07/2010 TEE/DCCV:  EF 60-65% mild MR.; b. Recurrent Afib--> Amiodarone;  Pradaxa (CHA2DS2VASc = 4);  c. Amio d/c'd 2/2 toxicity - now chronic rate-controlled afib.  . Constipation 11/22/2014  . Contusion of leg, left 01/16/2012  . GERD (gastroesophageal reflux disease)   . History of echocardiogram    a. 07/2015 Echo: EF 60-65%, Ao sclerosis, mild MR, mildly dil LA/RV/RA, mild Tr, PASP 29mmHg.  Marland Kitchen Hyperglycemia 01/10/2010     Qualifier: Diagnosis of  By: Niel Hummer MD, [place] Hyperlipidemia   . Hypertension   . Hyperthyroidism 02/09/2009   Qualifier: Diagnosis of  By: Niel Hummer MD, [place] Hypoxia 04/14/2012  . Intermittent claudication ([place]) 05/22/2012  . Measles as a child  . Medicare annual wellness visit, subsequent 11/27/2014   Follows with pulmonology, Dr Gwenette Greet Follow with cardiology, Dr Rance Muir with Dr Matthew Saras Does not participate in [facility], pap or colonoscopy screening at this time.  . Mumps as a child  . Obese   . Ovarian cyst   . Pelvic mass in female   . Peripheral neuropathy 02/26/2012  . Precancerous skin lesion   . Skin cancer    left temple  . Thyroid disease    hypothroidism  . Vitamin D deficiency      Social History   Socioeconomic History  . Marital status: Widowed    Spouse name: Not on file  . Number of children: 45  . Years of education: Not on file  . Highest education level: Not on file  Occupational History  . Occupation: retired    Fish farm manager: RETIRED  Tobacco Use  . Smoking status: Former Smoker    Packs/day: 0.30    Years: 5.00    Pack years: 1.50    Types: Cigarettes    Quit date: 10/28/1973    Years since quitting: 45.9  . Smokeless tobacco: Never Used  Substance and Sexual Activity  . Alcohol use: Yes    Comment: socially  . Drug use: No  . Sexual activity: Not on file  Other Topics Concern  . Not on file  Social History Narrative  . Not on file   Social Determinants of Health   Financial Resource Strain:   . Difficulty of Paying Living Expenses: Not on file  Food Insecurity:   . Worried About Charity fundraiser in the Last Year: Not on file  . Ran Out of Food in the Last Year: Not on file  Transportation Needs:   . Lack of Transportation (Medical): Not on file  . Lack of Transportation (Non-Medical): Not on file  Physical Activity:   . Days of Exercise per Week: Not on file  . Minutes of Exercise per Session: Not on file   Stress:   . Feeling of Stress : Not on file  Social Connections:   . Frequency of Communication with Friends and Family: Not on file  . Frequency of Social Gatherings with Friends and Family: Not on file  . Attends Religious Services: Not on file  . Active Member of Clubs or Organizations: Not on file  . Attends Archivist Meetings: Not on file  . Marital Status: Not on file  Intimate Partner Violence:   . Fear of Current or Ex-Partner: Not on file  . Emotionally Abused: Not on file  . Physically Abused: Not on file  . Sexually Abused: Not on file    Past Surgical History:  Procedure Laterality Date  . ABDOMINAL  HYSTERECTOMY    . CATARACT EXTRACTION     Dr. Satira Sark  . SKIN BIOPSY     left temple. cancerous  . TOTAL HIP ARTHROPLASTY  2003   left    Family History  Problem Relation Age of Onset  . Stroke Mother   . Prostate cancer Father        prostate  . Hypertension Brother   . Cancer Maternal Grandmother   . Hypertension Brother   . Heart disease Brother   . Obesity Brother   . Hypertension Brother   . Hyperlipidemia Brother   . Heart disease Brother   . Kidney disease Brother   . Cancer Daughter        kidney- spine and blood stream  . Heart disease Son        valvular heart disease  . Prostate cancer Brother     Allergies  Allergen Reactions  . Amiodarone Other (See Comments)    Amiodarone pulmonary toxicity.  . Aspercreme [Trolamine Salicylate] Other (See Comments)    Pt states it caused redness like it was burning her skin.    Current Outpatient Medications on File Prior to Visit  Medication Sig Dispense Refill  . acetaminophen (TYLENOL) 500 MG tablet Take 3,000 mg by mouth daily.     . Cholecalciferol (VITAMIN D PO) Take 1 capsule by mouth daily.     Marland Kitchen diltiazem (CARDIZEM CD) 360 MG 24 hr capsule TAKE 1 CAPSULE DAILY (NEED APPOINTMENT FOR FURTHER REFILLS) 90 capsule 4  . Docusate Sodium (PHILLIPS STOOL SOFTENER PO) Take 1 tablet by mouth  as needed.     Marland Kitchen ELIQUIS 5 MG TABS tablet TAKE 1 TABLET TWICE A DAY 180 tablet 1  . losartan (COZAAR) 25 MG tablet TAKE 1 TABLET EVERY EVENING (NEED APPOINTMENT FOR FURTHER REFILLS) 90 tablet 4  . Probiotic Product (PROBIOTIC DAILY PO) Take 1 tablet by mouth daily.     No current facility-administered medications on file prior to visit.    LMP 12/25/2011       Objective:   Physical Exam        Assessment & Plan:

## 2019-10-13 NOTE — Telephone Encounter (Signed)
Christy Gentles (daughter) states anytime after 11pm virtual appointment work in would be better then the 8:20am scheduled for 10/28/2019. Please advise best # [phone].

## 2019-10-13 NOTE — Telephone Encounter (Signed)
Appointment made for today

## 2019-10-25 ENCOUNTER — Telehealth: Payer: Self-pay

## 2019-10-25 NOTE — Telephone Encounter (Signed)
Copied from [place] [phone]. Topic: General - Other >> Oct 25, 2019 12:21 PM Leward Quan A wrote: Reason for CRM: Patient called to have her appointment rescheduled for later in the afternoon on 10/28/2019. She states that 8.20 is too early for her since she does not sleep well at nights and need an afternoon appointment. Please call Ph# [phone]

## 2019-10-28 ENCOUNTER — Ambulatory Visit (INDEPENDENT_AMBULATORY_CARE_PROVIDER_SITE_OTHER): Payer: Medicare Other | Admitting: Family Medicine

## 2019-10-28 ENCOUNTER — Other Ambulatory Visit: Payer: Self-pay

## 2019-10-28 ENCOUNTER — Other Ambulatory Visit: Payer: Self-pay | Admitting: Medical

## 2019-10-28 DIAGNOSIS — I10 Essential (primary) hypertension: Secondary | ICD-10-CM

## 2019-10-28 DIAGNOSIS — G2581 Restless legs syndrome: Secondary | ICD-10-CM | POA: Insufficient documentation

## 2019-10-28 NOTE — Assessment & Plan Note (Signed)
It has been getting worse and she is having trouble sleeping. She never picked up the Requip she was prescribed on 12/16. She agrees to go get it now and start it tonight and then we will reevaluate response and side effectes

## 2019-10-28 NOTE — Progress Notes (Signed)
Virtual Visit via Video Note  I connected with Gina Reilly on 10/31/19 at  8:20 AM EST by a video enabled telemedicine application and verified that I am speaking with the correct person using two identifiers.  Location: Patient: home Provider: home   I discussed the limitations of evaluation and management by telemedicine and the availability of in person appointments. The patient expressed understanding and agreed to proceed. Magdalene Molly, CMA was able to get the patient set up on a visit, phone after being unable to set up a video visit   Subjective:    Patient ID: Gina Reilly, female    DOB: Aug 30, 1924, 85 y.o.   MRN: [id number]  No chief complaint on file.   HPI Patient is in today for follow up on her restless leg symptoms. She finds she can no longer get comfortable. It has been bothering her for a year or so but has gotten worse. It is worst in the evening and at night. She was seen a few weeks ago and prescribed Requip but she never picked it up. She agrees to go pick it up now. No other recent illness or acute concerns. Denies CP/palp/SOB/HA/congestion/fevers/GI or GU c/o. Taking meds as prescribed  Past Medical History:  Diagnosis Date  . 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 Anemia   . Apnea, sleep 04/24/2012  . Arthritis    osteo  . Bronchitis 12/25/2011  . Chicken pox as a child  . Chronic atrial fibrillation ([place])    a. 07/2010 TEE/DCCV:  EF 60-65% mild MR.; b. Recurrent Afib--> Amiodarone;  Pradaxa (CHA2DS2VASc = 4);  c. Amio d/c'd 2/2 toxicity - now chronic rate-controlled afib.  . Constipation 11/22/2014  . Contusion of leg, left 01/16/2012  . GERD (gastroesophageal reflux disease)   . History of echocardiogram    a. 07/2015 Echo: EF 60-65%, Ao sclerosis, mild MR, mildly dil LA/RV/RA, mild Tr, PASP 38mmHg.  Marland Kitchen Hyperglycemia 01/10/2010   Qualifier:  Diagnosis of  By: Niel Hummer MD, [place] Hyperlipidemia   . Hypertension   . Hyperthyroidism 02/09/2009   Qualifier: Diagnosis of  By: Niel Hummer MD, [place] Hypoxia 04/14/2012  . Intermittent claudication ([place]) 05/22/2012  . Measles as a child  . Medicare annual wellness visit, subsequent 11/27/2014   Follows with pulmonology, Dr Gwenette Greet Follow with cardiology, Dr Rance Muir with Dr Matthew Saras Does not participate in [facility], pap or colonoscopy screening at this time.  . Mumps as a child  . Obese   . Ovarian cyst   . Pelvic mass in female   . Peripheral neuropathy 02/26/2012  . Precancerous skin lesion   . Skin cancer    left temple  . Thyroid disease    hypothroidism  . Vitamin D deficiency     Past Surgical History:  Procedure Laterality Date  . ABDOMINAL HYSTERECTOMY    . CATARACT EXTRACTION     Dr. Satira Sark  . SKIN BIOPSY     left temple. cancerous  . TOTAL HIP ARTHROPLASTY  2003   left    Family History  Problem Relation Age of Onset  . Stroke Mother   . Prostate cancer Father        prostate  . Hypertension Brother   . Cancer Maternal Grandmother   . Hypertension Brother   . Heart disease Brother   .  Obesity Brother   . Hypertension Brother   . Hyperlipidemia Brother   . Heart disease Brother   . Kidney disease Brother   . Cancer Daughter        kidney- spine and blood stream  . Heart disease Son        valvular heart disease  . Prostate cancer Brother     Social History   Socioeconomic History  . Marital status: Widowed    Spouse name: Not on file  . Number of children: 53  . Years of education: Not on file  . Highest education level: Not on file  Occupational History  . Occupation: retired    Fish farm manager: RETIRED  Tobacco Use  . Smoking status: Former Smoker    Packs/day: 0.30    Years: 5.00    Pack years: 1.50    Types: Cigarettes    Quit date: 10/28/1973    Years since quitting: 46.0  . Smokeless tobacco: Never Used  Substance and  Sexual Activity  . Alcohol use: Yes    Comment: socially  . Drug use: No  . Sexual activity: Not on file  Other Topics Concern  . Not on file  Social History Narrative  . Not on file   Social Determinants of Health   Financial Resource Strain:   . Difficulty of Paying Living Expenses: Not on file  Food Insecurity:   . Worried About Charity fundraiser in the Last Year: Not on file  . Ran Out of Food in the Last Year: Not on file  Transportation Needs:   . Lack of Transportation (Medical): Not on file  . Lack of Transportation (Non-Medical): Not on file  Physical Activity:   . Days of Exercise per Week: Not on file  . Minutes of Exercise per Session: Not on file  Stress:   . Feeling of Stress : Not on file  Social Connections:   . Frequency of Communication with Friends and Family: Not on file  . Frequency of Social Gatherings with Friends and Family: Not on file  . Attends Religious Services: Not on file  . Active Member of Clubs or Organizations: Not on file  . Attends Archivist Meetings: Not on file  . Marital Status: Not on file  Intimate Partner Violence:   . Fear of Current or Ex-Partner: Not on file  . Emotionally Abused: Not on file  . Physically Abused: Not on file  . Sexually Abused: Not on file    Outpatient Medications Prior to Visit  Medication Sig Dispense Refill  . acetaminophen (TYLENOL) 500 MG tablet Take 3,000 mg by mouth daily.     . Cholecalciferol (VITAMIN D PO) Take 1 capsule by mouth daily.     Marland Kitchen diltiazem (CARDIZEM CD) 360 MG 24 hr capsule TAKE 1 CAPSULE DAILY (NEED APPOINTMENT FOR FURTHER REFILLS) 90 capsule 4  . Docusate Sodium (PHILLIPS STOOL SOFTENER PO) Take 1 tablet by mouth as needed.     Marland Kitchen ELIQUIS 5 MG TABS tablet TAKE 1 TABLET TWICE A DAY 180 tablet 1  . losartan (COZAAR) 25 MG tablet TAKE 1 TABLET EVERY EVENING (NEED APPOINTMENT FOR FURTHER REFILLS) 90 tablet 4  . Probiotic Product (PROBIOTIC DAILY PO) Take 1 tablet by mouth  daily.    Marland Kitchen rOPINIRole (REQUIP) 0.5 MG tablet Take 1 tablet (0.5 mg total) by mouth at bedtime. 30 tablet 0   No facility-administered medications prior to visit.    Allergies  Allergen Reactions  . Amiodarone  Other (See Comments)    Amiodarone pulmonary toxicity.  . Aspercreme [Trolamine Salicylate] Other (See Comments)    Pt states it caused redness like it was burning her skin.    Review of Systems  Constitutional: Negative for fever and malaise/fatigue.  HENT: Negative for congestion.   Eyes: Negative for blurred vision.  Respiratory: Negative for shortness of breath.   Cardiovascular: Negative for chest pain, palpitations and leg swelling.  Gastrointestinal: Negative for abdominal pain, blood in stool and nausea.  Genitourinary: Negative for dysuria and frequency.  Musculoskeletal: Positive for myalgias. Negative for falls.  Skin: Negative for rash.  Neurological: Negative for dizziness, loss of consciousness and headaches.  Endo/Heme/Allergies: Negative for environmental allergies.  Psychiatric/Behavioral: Negative for depression. The patient is not nervous/anxious.        Objective:    Physical Exam unable to obtain via phone  Wt 230 lb (104.3 kg)   LMP 12/25/2011   BMI 36.02 kg/m  Wt Readings from Last 3 Encounters:  10/28/19 230 lb (104.3 kg)  10/11/19 235 lb (106.6 kg)  10/06/18 235 lb 12.8 oz (107 kg)    Diabetic Foot Exam - Simple   No data filed     Lab Results  Component Value Date   WBC 6.3 05/24/2019   HGB 14.0 05/24/2019   HCT 41.6 05/24/2019   PLT 213 05/24/2019   GLUCOSE 103 (H) 05/24/2019   CHOL 163 02/12/2018   TRIG 129 02/12/2018   HDL 65 02/12/2018   LDLDIRECT 128.4 02/09/2009   LDLCALC 77 02/12/2018   ALT 10 02/12/2018   AST 15 02/12/2018   NA 139 05/24/2019   K 4.5 05/24/2019   CL 103 05/24/2019   CREATININE 1.09 (H) 05/24/2019   BUN 23 05/24/2019   CO2 20 05/24/2019   TSH 2.03 02/12/2018   INR 1.32 08/17/2010   HGBA1C 6.1  (H) 02/12/2018    Lab Results  Component Value Date   TSH 2.03 02/12/2018   Lab Results  Component Value Date   WBC 6.3 05/24/2019   HGB 14.0 05/24/2019   HCT 41.6 05/24/2019   MCV 99 (H) 05/24/2019   PLT 213 05/24/2019   Lab Results  Component Value Date   NA 139 05/24/2019   K 4.5 05/24/2019   CO2 20 05/24/2019   GLUCOSE 103 (H) 05/24/2019   BUN 23 05/24/2019   CREATININE 1.09 (H) 05/24/2019   BILITOT 0.5 02/12/2018   ALKPHOS 68 11/11/2017   AST 15 02/12/2018   ALT 10 02/12/2018   PROT 6.6 02/12/2018   ALBUMIN 4.0 11/11/2017   CALCIUM 9.4 05/24/2019   GFR 69.32 11/11/2017   Lab Results  Component Value Date   CHOL 163 02/12/2018   Lab Results  Component Value Date   HDL 65 02/12/2018   Lab Results  Component Value Date   LDLCALC 77 02/12/2018   Lab Results  Component Value Date   TRIG 129 02/12/2018   Lab Results  Component Value Date   CHOLHDL 2.5 02/12/2018   Lab Results  Component Value Date   HGBA1C 6.1 (H) 02/12/2018       Assessment & Plan:   Problem List Items Addressed This Visit    Hypertension    Monitor and report any concerns,  no changes to meds. Encouraged heart healthy diet such as the DASH diet and exercise as tolerated.       RLS (restless legs syndrome)    It has been getting worse and she is having trouble  sleeping. She never picked up the Requip she was prescribed on 12/16. She agrees to go get it now and start it tonight and then we will reevaluate response and side effectes         I am having Gina Reilly maintain her Probiotic Product (PROBIOTIC DAILY PO), Cholecalciferol (VITAMIN D PO), Docusate Sodium (PHILLIPS STOOL SOFTENER PO), acetaminophen, diltiazem, losartan, and Eliquis.  No orders of the defined types were placed in this encounter.   I discussed the assessment and treatment plan with the patient. The patient was provided an opportunity to ask questions and all were answered. The patient agreed with the  plan and demonstrated an understanding of the instructions.   The patient was advised to call back or seek an in-person evaluation if the symptoms worsen or if the condition fails to improve as anticipated.  I provided 15 minutes of non-face-to-face time during this encounter.   Penni Homans, MD

## 2019-10-31 NOTE — Assessment & Plan Note (Signed)
Monitor and report any concerns, no changes to meds. Encouraged heart healthy diet such as the DASH diet and exercise as tolerated.  ?

## 2019-11-05 ENCOUNTER — Telehealth: Payer: Self-pay | Admitting: Family Medicine

## 2019-11-05 NOTE — Telephone Encounter (Signed)
Spoke with patient and let her know about message below, she voiced her understanding

## 2019-11-05 NOTE — Telephone Encounter (Signed)
Spoke with patient she stated the requip used to last her 24 hours, now it doesn't she wanted to know if the med can be increased Please advise

## 2019-11-05 NOTE — Telephone Encounter (Signed)
She can increase the Requip to bid as long as she is not too sedated on it. She can also try and take 1/2 tab during the day. She can try that for the weekend then let us know how that went. If it went well we can send in a new script next week with more tabs, if not we should have a visit to discuss options.

## 2019-11-05 NOTE — Telephone Encounter (Signed)
Copied from [place] [id number]. Topic: General - Other >> Nov 05, 2019  1:31 PM Keene Breath wrote: Reason for CRM: Patient would like to talk with the nurse regarding her meds. For restless legs, rOPINIRole (REQUIP) 0.5 MG tablet.  CB#[phone]

## 2019-11-09 ENCOUNTER — Other Ambulatory Visit: Payer: Self-pay | Admitting: Family Medicine

## 2019-11-09 MED ORDER — ROPINIROLE HCL 0.5 MG PO TABS: 0.5000 mg | ORAL_TABLET | Freq: Two times a day (BID) | ORAL | 1 refills | Status: DC

## 2019-11-09 NOTE — Telephone Encounter (Signed)
I sent a new prescription to [facility] for [facility] since we have increased the dosing so she does not run out. Please check with her and see if she also wants [place] to send a [facility] day supply to [facility] since it is working

## 2019-11-09 NOTE — Telephone Encounter (Signed)
Pt called to report that this medication is working well for her

## 2019-11-10 NOTE — Telephone Encounter (Signed)
Pt was called and message was left letting patient know to return call to let us know about which pharmacy she is wanting to get medication from.

## 2019-11-10 NOTE — Telephone Encounter (Deleted)
Pt stated she would like the Rx to be sent to  [place], [place] Phone:  [phone]  Fax:  [phone]

## 2019-11-10 NOTE — Telephone Encounter (Signed)
Pt returned call and stated she would like the Rx sent to [facility]

## 2019-11-11 MED ORDER — ROPINIROLE HCL 0.5 MG PO TABS: 0.5000 mg | ORAL_TABLET | Freq: Two times a day (BID) | ORAL | 1 refills | Status: DC

## 2019-11-11 NOTE — Telephone Encounter (Signed)
[facility]  

## 2019-11-17 ENCOUNTER — Other Ambulatory Visit: Payer: Self-pay | Admitting: Cardiology

## 2019-11-25 ENCOUNTER — Encounter: Payer: Self-pay | Admitting: Family Medicine

## 2019-11-25 ENCOUNTER — Other Ambulatory Visit: Payer: Self-pay

## 2019-11-25 ENCOUNTER — Ambulatory Visit (INDEPENDENT_AMBULATORY_CARE_PROVIDER_SITE_OTHER): Payer: Medicare Other | Admitting: Family Medicine

## 2019-11-25 DIAGNOSIS — K5909 Other constipation: Secondary | ICD-10-CM | POA: Diagnosis not present

## 2019-11-25 DIAGNOSIS — R140 Abdominal distension (gaseous): Secondary | ICD-10-CM

## 2019-11-25 DIAGNOSIS — N83209 Unspecified ovarian cyst, unspecified side: Secondary | ICD-10-CM

## 2019-11-26 NOTE — Progress Notes (Signed)
Virtual Visit via Video Note  I connected with Gina Reilly on 11/26/19 at  1:20 PM EST by a video enabled telemedicine application and verified that I am speaking with the correct person using two identifiers.  Location: Patient: home with daughter  Provider: office    I discussed the limitations of evaluation and management by telemedicine and the availability of in person appointments. The patient expressed understanding and agreed to proceed.  History of Present Illness: Pt is home with her daugter c/o constipation .   She called her daughter last night and said it had been several days since she had a BM.  She took a fleets enema yesterday and after she got off the phone with her daughter she had a small bm.  She saw some blood as well .    no abd pain.   Pt also has hx ovarian cyst and saw Dr Aldean Ast for this 10 years ago---- they did not want to do surgery at the time and probably not now either but she is concerned it is part of the problem and would like it checked  Observations/Objective: There were no vitals filed for this visit. No fevers Pt is in NAd   Assessment and Plan: 1. Cyst of ovary, unspecified laterality Pt and her daughter requested to get US done to check ovarian cyst   - US PELVIS (TRANSABDOMINAL ONLY); Future  2. Abdominal bloating Check [place] per pt request  - US PELVIS (TRANSABDOMINAL ONLY); Future  3. Other constipation Drink more water Can use prune juice, mom And or miralax daily  Call pcp / office if no improvement  She has f/u app next week    Follow Up Instructions:    I discussed the assessment and treatment plan with the patient. The patient was provided an opportunity to ask questions and all were answered. The patient agreed with the plan and demonstrated an understanding of the instructions.   The patient was advised to call back or seek an in-person evaluation if the symptoms worsen or if the condition fails to improve as  anticipated.  I provided 30 minutes of non-face-to-face time during this encounter.   Ann Held, DO

## 2019-12-02 ENCOUNTER — Ambulatory Visit (HOSPITAL_BASED_OUTPATIENT_CLINIC_OR_DEPARTMENT_OTHER): Payer: Medicare Other

## 2019-12-02 ENCOUNTER — Ambulatory Visit (INDEPENDENT_AMBULATORY_CARE_PROVIDER_SITE_OTHER): Payer: Medicare Other | Admitting: Family Medicine

## 2019-12-02 ENCOUNTER — Other Ambulatory Visit: Payer: Self-pay

## 2019-12-02 ENCOUNTER — Encounter (HOSPITAL_BASED_OUTPATIENT_CLINIC_OR_DEPARTMENT_OTHER): Payer: Self-pay

## 2019-12-02 ENCOUNTER — Ambulatory Visit (HOSPITAL_BASED_OUTPATIENT_CLINIC_OR_DEPARTMENT_OTHER)
Admission: RE | Admit: 2019-12-02 | Discharge: 2019-12-02 | Disposition: A | Discharge status: Home / Self Care | Payer: Medicare Other | Source: Ambulatory Visit | Attending: Cardiology | Admitting: Cardiology

## 2019-12-02 ENCOUNTER — Encounter: Payer: Self-pay | Admitting: Family Medicine

## 2019-12-02 VITALS — BP 137/83 | HR 87 | Temp 97.8°F | Resp 18 | Wt 230.0 lb

## 2019-12-02 DIAGNOSIS — E785 Hyperlipidemia, unspecified: Secondary | ICD-10-CM

## 2019-12-02 DIAGNOSIS — J984 Other disorders of lung: Secondary | ICD-10-CM

## 2019-12-02 DIAGNOSIS — R739 Hyperglycemia, unspecified: Secondary | ICD-10-CM | POA: Diagnosis not present

## 2019-12-02 DIAGNOSIS — E0590 Thyrotoxicosis, unspecified without thyrotoxic crisis or storm: Secondary | ICD-10-CM

## 2019-12-02 DIAGNOSIS — I517 Cardiomegaly: Secondary | ICD-10-CM | POA: Diagnosis not present

## 2019-12-02 DIAGNOSIS — E559 Vitamin D deficiency, unspecified: Secondary | ICD-10-CM

## 2019-12-02 DIAGNOSIS — R5381 Other malaise: Secondary | ICD-10-CM

## 2019-12-02 DIAGNOSIS — I10 Essential (primary) hypertension: Secondary | ICD-10-CM

## 2019-12-02 DIAGNOSIS — T462X5A Adverse effect of other antidysrhythmic drugs, initial encounter: Secondary | ICD-10-CM

## 2019-12-02 DIAGNOSIS — I4821 Permanent atrial fibrillation: Secondary | ICD-10-CM

## 2019-12-02 LAB — COMPREHENSIVE METABOLIC PANEL
ALT: 12 U/L (ref 0–35)
AST: 14 U/L (ref 0–37)
Albumin: 4.3 g/dL (ref 3.5–5.2)
Alkaline Phosphatase: 76 U/L (ref 39–117)
BUN: 20 mg/dL (ref 6–23)
CO2: 26 mEq/L (ref 19–32)
Calcium: 9.8 mg/dL (ref 8.4–10.5)
Chloride: 104 mEq/L (ref 96–112)
Creatinine, Ser: 0.84 mg/dL (ref 0.40–1.20)
GFR: 63.14 mL/min (ref >60.00)
Glucose, Bld: 111 mg/dL — ABNORMAL HIGH (ref 70–99)
Potassium: 5.4 mEq/L — ABNORMAL HIGH (ref 3.5–5.1)
Sodium: 137 mEq/L (ref 135–145)
Total Bilirubin: 0.5 mg/dL (ref 0.2–1.2)
Total Protein: 6.9 g/dL (ref 6.0–8.3)

## 2019-12-02 LAB — CBC
HCT: 42.9 % (ref 36.0–46.0)
Hemoglobin: 14.3 g/dL (ref 12.0–15.0)
MCHC: 33.2 g/dL (ref 30.0–36.0)
MCV: 101.1 fl — ABNORMAL HIGH (ref 78.0–100.0)
Platelets: 236 10*3/uL (ref 150.0–400.0)
RBC: 4.24 Mil/uL (ref 3.87–5.11)
RDW: 13.2 % (ref 11.5–15.5)
WBC: 7.8 10*3/uL (ref 4.0–10.5)

## 2019-12-02 LAB — LIPID PANEL
Cholesterol: 151 mg/dL (ref 0–200)
HDL: 61.3 mg/dL (ref >39.00)
LDL Cholesterol: 64 mg/dL (ref 0–99)
NonHDL: 89.53
Total CHOL/HDL Ratio: 2
Triglycerides: 128 mg/dL (ref 0.0–149.0)
VLDL: 25.6 mg/dL (ref 0.0–40.0)

## 2019-12-02 LAB — HEMOGLOBIN A1C: Hgb A1c MFr Bld: 6.4 % (ref 4.6–6.5)

## 2019-12-02 LAB — TSH: TSH: 3.19 u[IU]/mL (ref 0.35–4.50)

## 2019-12-02 LAB — VITAMIN D 25 HYDROXY (VIT D DEFICIENCY, FRACTURES): VITD: 26.92 ng/mL — ABNORMAL LOW (ref 30.00–100.00)

## 2019-12-02 NOTE — Patient Instructions (Signed)
Omron Blood Pressure cuff, upper arm, want BP 100-140/60-90 Pulse oximeter, want oxygen in 90s  Weekly vitals  Take Multivitamin with minerals, selenium Vitamin D 1000-2000 IU daily Probiotic with lactobacillus and bifidophilus Asprin EC 81 mg daily  Melatonin 2-5 mg at bedtime  [email or web address]    Encouraged increased hydration and fiber in diet. Daily probiotics. If bowels not moving can use MOM 2 tbls po in 4 oz of warm prune juice by mouth every 2-3 days. If no results then repeat in 4 hours with  Dulcolax suppository pr, may repeat again in 4 more hours as needed. Seek care if symptoms worsen. Consider daily Miralax and/or Dulcolax if symptoms persist.   Try mixing Miralax and Benefiber together and taking as needed up to 3 x a day or as infrequently as every other day

## 2019-12-03 ENCOUNTER — Other Ambulatory Visit: Payer: Self-pay

## 2019-12-03 MED ORDER — VITAMIN D (ERGOCALCIFEROL) 1.25 MG (50000 UNIT) PO CAPS: 50000.0000 [IU] | ORAL_CAPSULE | ORAL | 4 refills | Status: DC

## 2019-12-04 ENCOUNTER — Encounter: Payer: Self-pay | Admitting: Family Medicine

## 2019-12-06 NOTE — Assessment & Plan Note (Signed)
TSH is WNL

## 2019-12-06 NOTE — Assessment & Plan Note (Signed)
Due to numerous factors patient is largely wheelchair bound and in need of significant help with activities of daily living including bathing, dressing, meals and more.

## 2019-12-06 NOTE — Assessment & Plan Note (Signed)
Encouraged heart healthy diet, increase exercise, avoid trans fats, consider a krill oil cap daily 

## 2019-12-06 NOTE — Assessment & Plan Note (Signed)
Supplement and monitor 

## 2019-12-06 NOTE — Progress Notes (Signed)
Subjective:    Patient ID: Gina Reilly, female    DOB: 1923/11/23, 86 y.o.   MRN: [id number]  No chief complaint on file.   HPI Patient is in today for follow up accompanied by her daughter. She is requiring more and more help with activities of daily living and they are ready to get her help. They are hoping to use her long term care insurance. She needs help with bathing, dressing, meals, toileting. No recent febrile illness or hospitalizations. She notes weakness, sob, pedal edema as well. Denies CP/palp/HA/congestion/fevers/GI or GU c/o. Taking meds as prescribed  Past Medical History:  Diagnosis Date  . 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 Anemia   . Apnea, sleep 04/24/2012  . Arthritis    osteo  . Bronchitis 12/25/2011  . Chicken pox as a child  . Chronic atrial fibrillation ([place])    a. 07/2010 TEE/DCCV:  EF 60-65% mild MR.; b. Recurrent Afib--> Amiodarone;  Pradaxa (CHA2DS2VASc = 4);  c. Amio d/c'd 2/2 toxicity - now chronic rate-controlled afib.  . Constipation 11/22/2014  . Contusion of leg, left 01/16/2012  . GERD (gastroesophageal reflux disease)   . History of echocardiogram    a. 07/2015 Echo: EF 60-65%, Ao sclerosis, mild MR, mildly dil LA/RV/RA, mild Tr, PASP 60mmHg.  Marland Kitchen Hyperglycemia 01/10/2010   Qualifier: Diagnosis of  By: Niel Hummer MD, [place] Hyperlipidemia   . Hypertension   . Hyperthyroidism 02/09/2009   Qualifier: Diagnosis of  By: Niel Hummer MD, [place] Hypoxia 04/14/2012  . Intermittent claudication ([place]) 05/22/2012  . Measles as a child  . Medicare annual wellness visit, subsequent 11/27/2014   Follows with pulmonology, Dr Gwenette Greet Follow with cardiology, Dr Rance Muir with Dr Matthew Saras Does not participate in [facility], pap or colonoscopy screening at this time.  . Mumps as a child  . Obese   . Ovarian cyst   . Pelvic mass in female    . Peripheral neuropathy 02/26/2012  . Precancerous skin lesion   . Skin cancer    left temple  . Thyroid disease    hypothroidism  . Vitamin D deficiency     Past Surgical History:  Procedure Laterality Date  . ABDOMINAL HYSTERECTOMY    . CATARACT EXTRACTION     Dr. Satira Sark  . SKIN BIOPSY     left temple. cancerous  . TOTAL HIP ARTHROPLASTY  2003   left    Family History  Problem Relation Age of Onset  . Stroke Mother   . Prostate cancer Father        prostate  . Hypertension Brother   . Cancer Maternal Grandmother   . Hypertension Brother   . Heart disease Brother   . Obesity Brother   . Hypertension Brother   . Hyperlipidemia Brother   . Heart disease Brother   . Kidney disease Brother   . Cancer Daughter        kidney- spine and blood stream  . Heart disease Son        valvular heart disease  . Prostate cancer Brother     Social History   Socioeconomic History  . Marital status: Widowed    Spouse name: Not on file  . Number of children: 54  . Years of education: Not on file  . Highest education level: Not on file  Occupational History  . Occupation: retired    Fish farm manager: RETIRED  Tobacco Use  . Smoking status: Former Smoker    Packs/day: 0.30    Years: 5.00    Pack years: 1.50    Types: Cigarettes    Quit date: 10/28/1973    Years since quitting: 46.1  . Smokeless tobacco: Never Used  Substance and Sexual Activity  . Alcohol use: Yes    Comment: socially  . Drug use: No  . Sexual activity: Not on file  Other Topics Concern  . Not on file  Social History Narrative  . Not on file   Social Determinants of Health   Financial Resource Strain:   . Difficulty of Paying Living Expenses: Not on file  Food Insecurity:   . Worried About Charity fundraiser in the Last Year: Not on file  . Ran Out of Food in the Last Year: Not on file  Transportation Needs:   . Lack of Transportation (Medical): Not on file  . Lack of Transportation (Non-Medical): Not  on file  Physical Activity:   . Days of Exercise per Week: Not on file  . Minutes of Exercise per Session: Not on file  Stress:   . Feeling of Stress : Not on file  Social Connections:   . Frequency of Communication with Friends and Family: Not on file  . Frequency of Social Gatherings with Friends and Family: Not on file  . Attends Religious Services: Not on file  . Active Member of Clubs or Organizations: Not on file  . Attends Archivist Meetings: Not on file  . Marital Status: Not on file  Intimate Partner Violence:   . Fear of Current or Ex-Partner: Not on file  . Emotionally Abused: Not on file  . Physically Abused: Not on file  . Sexually Abused: Not on file    Outpatient Medications Prior to Visit  Medication Sig Dispense Refill  . acetaminophen (TYLENOL) 500 MG tablet Take 3,000 mg by mouth daily.     . Cholecalciferol (VITAMIN D PO) Take 1 capsule by mouth daily.     Marland Kitchen diltiazem (CARDIZEM CD) 360 MG 24 hr capsule TAKE 1 CAPSULE DAILY (NEED APPOINTMENT FOR FURTHER REFILLS) 90 capsule 4  . Docusate Sodium (PHILLIPS STOOL SOFTENER PO) Take 1 tablet by mouth as needed.     Marland Kitchen ELIQUIS 5 MG TABS tablet TAKE 1 TABLET TWICE A DAY 180 tablet 3  . losartan (COZAAR) 25 MG tablet TAKE 1 TABLET EVERY EVENING (NEED APPOINTMENT FOR FURTHER REFILLS) 90 tablet 4  . Probiotic Product (PROBIOTIC DAILY PO) Take 1 tablet by mouth daily.    Marland Kitchen rOPINIRole (REQUIP) 0.5 MG tablet Take 1 tablet (0.5 mg total) by mouth 2 (two) times daily. 180 tablet 1   No facility-administered medications prior to visit.    Allergies  Allergen Reactions  . Amiodarone Other (See Comments)    Amiodarone pulmonary toxicity.  . Aspercreme [Trolamine Salicylate] Other (See Comments)    Pt states it caused redness like it was burning her skin.    Review of Systems  Constitutional: Positive for malaise/fatigue. Negative for fever.  HENT: Negative for congestion.   Eyes: Negative for blurred vision.   Respiratory: Positive for shortness of breath.   Cardiovascular: Positive for leg swelling. Negative for chest pain and palpitations.  Gastrointestinal: Negative for abdominal pain, blood in stool and nausea.  Genitourinary: Positive for frequency. Negative for dysuria.  Musculoskeletal: Negative for falls.  Skin: Negative for  rash.  Neurological: Negative for dizziness, loss of consciousness and headaches.  Endo/Heme/Allergies: Negative for environmental allergies.  Psychiatric/Behavioral: Negative for depression. The patient is not nervous/anxious.        Objective:    Physical Exam Vitals and nursing note reviewed.  Constitutional:      General: She is not in acute distress.    Appearance: She is well-developed.  HENT:     Victor: Normocephalic and atraumatic.     Nose: Nose normal.  Eyes:     General:        Right eye: No discharge.        Left eye: No discharge.  Cardiovascular:     Rate and Rhythm: Normal rate and regular rhythm.     Heart sounds: Murmur present.  Pulmonary:     Effort: Pulmonary effort is normal.     Breath sounds: Normal breath sounds.  Abdominal:     General: Bowel sounds are normal.     Palpations: Abdomen is soft.     Tenderness: There is no abdominal tenderness.  Musculoskeletal:     Cervical back: Normal range of motion and neck supple.     Right lower leg: Edema present.     Left lower leg: Edema present.  Skin:    General: Skin is warm and dry.  Neurological:     Mental Status: She is alert and oriented to person, place, and time.     BP 137/83 (BP Location: Left Arm, Patient Position: Sitting, Cuff Size: Normal)   Pulse 87   Temp 97.8 F (36.6 C) (Temporal)   Resp 18   Wt 230 lb (104.3 kg)   LMP 12/25/2011   SpO2 98%   BMI 36.02 kg/m  Wt Readings from Last 3 Encounters:  12/02/19 230 lb (104.3 kg)  10/28/19 230 lb (104.3 kg)  10/11/19 235 lb (106.6 kg)    Diabetic Foot Exam - Simple   No data filed     Lab Results   Component Value Date   WBC 7.8 12/02/2019   HGB 14.3 12/02/2019   HCT 42.9 12/02/2019   PLT 236.0 12/02/2019   GLUCOSE 111 (H) 12/02/2019   CHOL 151 12/02/2019   TRIG 128.0 12/02/2019   HDL 61.30 12/02/2019   LDLDIRECT 128.4 02/09/2009   LDLCALC 64 12/02/2019   ALT 12 12/02/2019   AST 14 12/02/2019   NA 137 12/02/2019   K 5.4 (H) 12/02/2019   CL 104 12/02/2019   CREATININE 0.84 12/02/2019   BUN 20 12/02/2019   CO2 26 12/02/2019   TSH 3.19 12/02/2019   INR 1.32 08/17/2010   HGBA1C 6.4 12/02/2019    Lab Results  Component Value Date   TSH 3.19 12/02/2019   Lab Results  Component Value Date   WBC 7.8 12/02/2019   HGB 14.3 12/02/2019   HCT 42.9 12/02/2019   MCV 101.1 (H) 12/02/2019   PLT 236.0 12/02/2019   Lab Results  Component Value Date   NA 137 12/02/2019   K 5.4 (H) 12/02/2019   CO2 26 12/02/2019   GLUCOSE 111 (H) 12/02/2019   BUN 20 12/02/2019   CREATININE 0.84 12/02/2019   BILITOT 0.5 12/02/2019   ALKPHOS 76 12/02/2019   AST 14 12/02/2019   ALT 12 12/02/2019   PROT 6.9 12/02/2019   ALBUMIN 4.3 12/02/2019   CALCIUM 9.8 12/02/2019   GFR 63.14 12/02/2019   Lab Results  Component Value Date   CHOL 151 12/02/2019   Lab Results  Component  Value Date   HDL 61.30 12/02/2019   Lab Results  Component Value Date   LDLCALC 64 12/02/2019   Lab Results  Component Value Date   TRIG 128.0 12/02/2019   Lab Results  Component Value Date   CHOLHDL 2 12/02/2019   Lab Results  Component Value Date   HGBA1C 6.4 12/02/2019       Assessment & Plan:   Problem List Items Addressed This Visit    Hyperthyroidism    TSH is WNL      Relevant Orders   TSH (Completed)   Hyperglycemia - Primary    hgba1c acceptable, minimize simple carbs. Increase exercise as tolerated.       Relevant Orders   Comprehensive metabolic panel (Completed)   Hemoglobin A1c (Completed)   Vitamin D deficiency    Supplement and monitor      Relevant Orders   VITAMIN D 25  Hydroxy (Vit-D Deficiency, Fractures) (Completed)   Hyperlipidemia    Encouraged heart healthy diet, increase exercise, avoid trans fats, consider a krill oil cap daily      Relevant Orders   Lipid panel (Completed)   Debility    Is accompanied by her daughter and they are in need of more help in caring for patient in her home. She is need of help with most ADLs at this time. Filled out paperwork to get help her utilize her home health insurance.       Hypertension    Well controlled, no changes to meds. Encouraged heart healthy diet such as the DASH diet and exercise as tolerated.       Relevant Orders   CBC (Completed)   LVH (left ventricular hypertrophy)    Due to numerous factors patient is largely wheelchair bound and in need of significant help with activities of daily living including bathing, dressing, meals and more.       Amiodarone pulmonary toxicity    Continues to struggle with dyspnea          I am having Rochel N. Saltzman maintain her Probiotic Product (PROBIOTIC DAILY PO), Cholecalciferol (VITAMIN D PO), Docusate Sodium (PHILLIPS STOOL SOFTENER PO), acetaminophen, diltiazem, losartan, rOPINIRole, and Eliquis.  No orders of the defined types were placed in this encounter.    Penni Homans, MD

## 2019-12-06 NOTE — Assessment & Plan Note (Signed)
hgba1c acceptable, minimize simple carbs. Increase exercise as tolerated.  

## 2019-12-06 NOTE — Assessment & Plan Note (Signed)
Continues to struggle with dyspnea

## 2019-12-06 NOTE — Assessment & Plan Note (Signed)
Well controlled, no changes to meds. Encouraged heart healthy diet such as the DASH diet and exercise as tolerated.  °

## 2019-12-06 NOTE — Assessment & Plan Note (Signed)
Is accompanied by her daughter and they are in need of more help in caring for patient in her home. She is need of help with most ADLs at this time. Filled out paperwork to get help her utilize her home health insurance.

## 2019-12-09 ENCOUNTER — Encounter: Payer: Self-pay | Admitting: Family Medicine

## 2019-12-10 ENCOUNTER — Encounter: Payer: Self-pay | Admitting: Family Medicine

## 2019-12-13 ENCOUNTER — Other Ambulatory Visit: Payer: Self-pay | Admitting: Family Medicine

## 2019-12-13 MED ORDER — TERBINAFINE HCL 1 % EX CREA: 1.0000 "application " | TOPICAL_CREAM | Freq: Two times a day (BID) | CUTANEOUS | 0 refills | Status: DC

## 2019-12-14 ENCOUNTER — Other Ambulatory Visit: Payer: Self-pay | Admitting: Family Medicine

## 2019-12-14 ENCOUNTER — Ambulatory Visit: Payer: Medicare Other | Admitting: Family Medicine

## 2019-12-14 ENCOUNTER — Encounter: Payer: Self-pay | Admitting: Family Medicine

## 2019-12-14 ENCOUNTER — Other Ambulatory Visit: Payer: Self-pay

## 2019-12-14 DIAGNOSIS — J984 Other disorders of lung: Secondary | ICD-10-CM

## 2019-12-14 DIAGNOSIS — R5381 Other malaise: Secondary | ICD-10-CM

## 2019-12-14 DIAGNOSIS — I4891 Unspecified atrial fibrillation: Secondary | ICD-10-CM

## 2019-12-14 DIAGNOSIS — R2681 Unsteadiness on feet: Secondary | ICD-10-CM

## 2019-12-14 DIAGNOSIS — R739 Hyperglycemia, unspecified: Secondary | ICD-10-CM

## 2019-12-14 DIAGNOSIS — E0590 Thyrotoxicosis, unspecified without thyrotoxic crisis or storm: Secondary | ICD-10-CM

## 2019-12-14 DIAGNOSIS — T462X5A Adverse effect of other antidysrhythmic drugs, initial encounter: Secondary | ICD-10-CM

## 2019-12-14 NOTE — Progress Notes (Signed)
Virtual Visit via Telephone Note  I connected with Natasha N Cortner on 12/14/19 at 11:00 AM EST by telephone and verified that I am speaking with the correct person using two identifiers.  Location: Patient: home  Provider: office    I discussed the limitations, risks, security and privacy concerns of performing an evaluation and management service by telephone and the availability of in person appointments. I also discussed with the patient that there may be a patient responsible charge related to this service. The patient expressed understanding and agreed to proceed.   History of Present Illness: Pt is home and had questions about her home health referral---- pt did not need virtual visit    Observations/Objective:   Assessment and Plan:   Follow Up Instructions:    I discussed the assessment and treatment plan with the patient. The patient was provided an opportunity to ask questions and all were answered. The patient agreed with the plan and demonstrated an understanding of the instructions.   The patient was advised to call back or seek an in-person evaluation if the symptoms worsen or if the condition fails to improve as anticipated.  I provided 5 minutes of non-face-to-face time during this encounter.   Ann Held, DO

## 2019-12-19 ENCOUNTER — Other Ambulatory Visit: Payer: Self-pay | Admitting: Family Medicine

## 2019-12-19 ENCOUNTER — Encounter: Payer: Self-pay | Admitting: Family Medicine

## 2019-12-20 ENCOUNTER — Other Ambulatory Visit: Payer: Self-pay | Admitting: Family Medicine

## 2019-12-20 DIAGNOSIS — R609 Edema, unspecified: Secondary | ICD-10-CM

## 2019-12-20 MED ORDER — FUROSEMIDE 20 MG PO TABS: 20.0000 mg | ORAL_TABLET | Freq: Every day | ORAL | 5 refills | Status: DC | PRN

## 2020-01-16 ENCOUNTER — Other Ambulatory Visit: Payer: Self-pay | Admitting: Family Medicine

## 2020-02-28 ENCOUNTER — Other Ambulatory Visit: Payer: Self-pay | Admitting: Cardiology

## 2020-03-10 ENCOUNTER — Encounter: Payer: Self-pay | Admitting: Family Medicine

## 2020-03-11 ENCOUNTER — Observation Stay (HOSPITAL_COMMUNITY): Payer: Medicare Other

## 2020-03-11 ENCOUNTER — Other Ambulatory Visit: Payer: Self-pay

## 2020-03-11 ENCOUNTER — Emergency Department (HOSPITAL_COMMUNITY): Payer: Medicare Other

## 2020-03-11 ENCOUNTER — Encounter (HOSPITAL_COMMUNITY): Payer: Self-pay | Admitting: Emergency Medicine

## 2020-03-11 ENCOUNTER — Inpatient Hospital Stay (HOSPITAL_COMMUNITY)
Admission: EM | Admit: 2020-03-11 | Discharge: 2020-03-16 | DRG: 641 | Disposition: A | Discharge status: Home Health | Payer: Medicare Other | Attending: Internal Medicine | Admitting: Internal Medicine

## 2020-03-11 DIAGNOSIS — G2581 Restless legs syndrome: Secondary | ICD-10-CM | POA: Diagnosis present

## 2020-03-11 DIAGNOSIS — Z7901 Long term (current) use of anticoagulants: Secondary | ICD-10-CM

## 2020-03-11 DIAGNOSIS — Z8042 Family history of malignant neoplasm of prostate: Secondary | ICD-10-CM

## 2020-03-11 DIAGNOSIS — E875 Hyperkalemia: Secondary | ICD-10-CM | POA: Diagnosis present

## 2020-03-11 DIAGNOSIS — I4819 Other persistent atrial fibrillation: Secondary | ICD-10-CM | POA: Diagnosis present

## 2020-03-11 DIAGNOSIS — Z515 Encounter for palliative care: Secondary | ICD-10-CM

## 2020-03-11 DIAGNOSIS — G629 Polyneuropathy, unspecified: Secondary | ICD-10-CM | POA: Diagnosis present

## 2020-03-11 DIAGNOSIS — R531 Weakness: Secondary | ICD-10-CM

## 2020-03-11 DIAGNOSIS — I110 Hypertensive heart disease with heart failure: Secondary | ICD-10-CM | POA: Diagnosis present

## 2020-03-11 DIAGNOSIS — I5032 Chronic diastolic (congestive) heart failure: Secondary | ICD-10-CM | POA: Diagnosis not present

## 2020-03-11 DIAGNOSIS — J811 Chronic pulmonary edema: Secondary | ICD-10-CM | POA: Diagnosis present

## 2020-03-11 DIAGNOSIS — K219 Gastro-esophageal reflux disease without esophagitis: Secondary | ICD-10-CM | POA: Diagnosis present

## 2020-03-11 DIAGNOSIS — J810 Acute pulmonary edema: Secondary | ICD-10-CM | POA: Diagnosis not present

## 2020-03-11 DIAGNOSIS — R609 Edema, unspecified: Secondary | ICD-10-CM

## 2020-03-11 DIAGNOSIS — I160 Hypertensive urgency: Secondary | ICD-10-CM | POA: Diagnosis present

## 2020-03-11 DIAGNOSIS — B372 Candidiasis of skin and nail: Secondary | ICD-10-CM | POA: Diagnosis present

## 2020-03-11 DIAGNOSIS — M1990 Unspecified osteoarthritis, unspecified site: Secondary | ICD-10-CM | POA: Diagnosis present

## 2020-03-11 DIAGNOSIS — B373 Candidiasis of vulva and vagina: Secondary | ICD-10-CM | POA: Diagnosis not present

## 2020-03-11 DIAGNOSIS — Z87891 Personal history of nicotine dependence: Secondary | ICD-10-CM

## 2020-03-11 DIAGNOSIS — I248 Other forms of acute ischemic heart disease: Secondary | ICD-10-CM | POA: Diagnosis not present

## 2020-03-11 DIAGNOSIS — I4891 Unspecified atrial fibrillation: Secondary | ICD-10-CM | POA: Diagnosis present

## 2020-03-11 DIAGNOSIS — E876 Hypokalemia: Secondary | ICD-10-CM | POA: Diagnosis not present

## 2020-03-11 DIAGNOSIS — R778 Other specified abnormalities of plasma proteins: Secondary | ICD-10-CM | POA: Diagnosis present

## 2020-03-11 DIAGNOSIS — I4811 Longstanding persistent atrial fibrillation: Secondary | ICD-10-CM | POA: Diagnosis not present

## 2020-03-11 DIAGNOSIS — Z83438 Family history of other disorder of lipoprotein metabolism and other lipidemia: Secondary | ICD-10-CM

## 2020-03-11 DIAGNOSIS — G4733 Obstructive sleep apnea (adult) (pediatric): Secondary | ICD-10-CM | POA: Diagnosis present

## 2020-03-11 DIAGNOSIS — M158 Other polyosteoarthritis: Secondary | ICD-10-CM | POA: Diagnosis not present

## 2020-03-11 DIAGNOSIS — G8929 Other chronic pain: Secondary | ICD-10-CM | POA: Diagnosis present

## 2020-03-11 DIAGNOSIS — M25551 Pain in right hip: Secondary | ICD-10-CM

## 2020-03-11 DIAGNOSIS — R079 Chest pain, unspecified: Secondary | ICD-10-CM | POA: Diagnosis not present

## 2020-03-11 DIAGNOSIS — E785 Hyperlipidemia, unspecified: Secondary | ICD-10-CM | POA: Diagnosis present

## 2020-03-11 DIAGNOSIS — Z66 Do not resuscitate: Secondary | ICD-10-CM | POA: Diagnosis present

## 2020-03-11 DIAGNOSIS — E669 Obesity, unspecified: Secondary | ICD-10-CM | POA: Diagnosis present

## 2020-03-11 DIAGNOSIS — I10 Essential (primary) hypertension: Secondary | ICD-10-CM | POA: Diagnosis not present

## 2020-03-11 DIAGNOSIS — Z6838 Body mass index (BMI) 38.0-38.9, adult: Secondary | ICD-10-CM

## 2020-03-11 DIAGNOSIS — Z85828 Personal history of other malignant neoplasm of skin: Secondary | ICD-10-CM

## 2020-03-11 DIAGNOSIS — R32 Unspecified urinary incontinence: Secondary | ICD-10-CM | POA: Diagnosis present

## 2020-03-11 DIAGNOSIS — R0789 Other chest pain: Secondary | ICD-10-CM | POA: Diagnosis not present

## 2020-03-11 DIAGNOSIS — Z7189 Other specified counseling: Secondary | ICD-10-CM

## 2020-03-11 DIAGNOSIS — Z8249 Family history of ischemic heart disease and other diseases of the circulatory system: Secondary | ICD-10-CM

## 2020-03-11 DIAGNOSIS — Z7401 Bed confinement status: Secondary | ICD-10-CM

## 2020-03-11 DIAGNOSIS — E039 Hypothyroidism, unspecified: Secondary | ICD-10-CM | POA: Diagnosis present

## 2020-03-11 DIAGNOSIS — R0602 Shortness of breath: Secondary | ICD-10-CM | POA: Diagnosis not present

## 2020-03-11 DIAGNOSIS — R1084 Generalized abdominal pain: Secondary | ICD-10-CM | POA: Diagnosis not present

## 2020-03-11 DIAGNOSIS — Z888 Allergy status to other drugs, medicaments and biological substances status: Secondary | ICD-10-CM

## 2020-03-11 DIAGNOSIS — Z841 Family history of disorders of kidney and ureter: Secondary | ICD-10-CM

## 2020-03-11 DIAGNOSIS — B3731 Acute candidiasis of vulva and vagina: Secondary | ICD-10-CM | POA: Diagnosis present

## 2020-03-11 DIAGNOSIS — M1611 Unilateral primary osteoarthritis, right hip: Secondary | ICD-10-CM | POA: Diagnosis not present

## 2020-03-11 DIAGNOSIS — Z823 Family history of stroke: Secondary | ICD-10-CM

## 2020-03-11 LAB — BASIC METABOLIC PANEL
Anion gap: 9 (ref 5–15)
BUN: 17 mg/dL (ref 8–23)
CO2: 25 mmol/L (ref 22–32)
Calcium: 9.6 mg/dL (ref 8.9–10.3)
Chloride: 102 mmol/L (ref 98–111)
Creatinine, Ser: 0.93 mg/dL (ref 0.44–1.00)
GFR calc Af Amer: >60 mL/min (ref >60)
GFR calc non Af Amer: 53 mL/min — ABNORMAL LOW (ref >60)
Glucose, Bld: 135 mg/dL — ABNORMAL HIGH (ref 70–99)
Potassium: 6.3 mmol/L (ref 3.5–5.1)
Sodium: 136 mmol/L (ref 135–145)

## 2020-03-11 LAB — CBC
HCT: 43.5 % (ref 36.0–46.0)
Hemoglobin: 13.8 g/dL (ref 12.0–15.0)
MCH: 33.7 pg (ref 26.0–34.0)
MCHC: 31.7 g/dL (ref 30.0–36.0)
MCV: 106.1 fL — ABNORMAL HIGH (ref 80.0–100.0)
Platelets: 184 10*3/uL (ref 150–400)
RBC: 4.1 MIL/uL (ref 3.87–5.11)
RDW: 14.5 % (ref 11.5–15.5)
WBC: 8 10*3/uL (ref 4.0–10.5)
nRBC: 0 % (ref 0.0–0.2)

## 2020-03-11 LAB — URINALYSIS, ROUTINE W REFLEX MICROSCOPIC
Bilirubin Urine: NEGATIVE
Glucose, UA: NEGATIVE mg/dL
Hgb urine dipstick: NEGATIVE
Ketones, ur: NEGATIVE mg/dL
Leukocytes,Ua: NEGATIVE
Nitrite: NEGATIVE
Protein, ur: 100 mg/dL — AB
Specific Gravity, Urine: 1.012 (ref 1.005–1.030)
pH: 5 (ref 5.0–8.0)

## 2020-03-11 LAB — TROPONIN I (HIGH SENSITIVITY)
Troponin I (High Sensitivity): 23 ng/L — ABNORMAL HIGH (ref <18)
Troponin I (High Sensitivity): 25 ng/L — ABNORMAL HIGH (ref <18)

## 2020-03-11 LAB — CBG MONITORING, ED: Glucose-Capillary: 130 mg/dL — ABNORMAL HIGH (ref 70–99)

## 2020-03-11 LAB — POTASSIUM: Potassium: 4 mmol/L (ref 3.5–5.1)

## 2020-03-11 LAB — MAGNESIUM: Magnesium: 1.6 mg/dL — ABNORMAL LOW (ref 1.7–2.4)

## 2020-03-11 LAB — TSH: TSH: 1.991 u[IU]/mL (ref 0.350–4.500)

## 2020-03-11 LAB — BRAIN NATRIURETIC PEPTIDE: B Natriuretic Peptide: 72.5 pg/mL (ref 0.0–100.0)

## 2020-03-11 MED ORDER — TRAMADOL HCL 50 MG PO TABS
50.0000 mg | ORAL_TABLET | Freq: Once | ORAL | Status: AC
  Administered 2020-03-11: 50 mg via ORAL
  Filled 2020-03-11: qty 1

## 2020-03-11 MED ORDER — ONDANSETRON HCL 4 MG/2ML IJ SOLN: 4.0000 mg | Freq: Four times a day (QID) | INTRAMUSCULAR | Status: DC | PRN

## 2020-03-11 MED ORDER — NYSTATIN 100000 UNIT/GM EX POWD
Freq: Once | CUTANEOUS | Status: AC
  Filled 2020-03-11: qty 15

## 2020-03-11 MED ORDER — LOSARTAN POTASSIUM 25 MG PO TABS
25.0000 mg | ORAL_TABLET | Freq: Every day | ORAL | Status: DC
  Administered 2020-03-11 – 2020-03-15 (×5): 25 mg via ORAL
  Filled 2020-03-11 (×5): qty 1

## 2020-03-11 MED ORDER — ALBUTEROL SULFATE (2.5 MG/3ML) 0.083% IN NEBU
2.5000 mg | INHALATION_SOLUTION | Freq: Four times a day (QID) | RESPIRATORY_TRACT | Status: DC | PRN
  Administered 2020-03-13: 2.5 mg via RESPIRATORY_TRACT
  Filled 2020-03-11: qty 3

## 2020-03-11 MED ORDER — SODIUM ZIRCONIUM CYCLOSILICATE 10 G PO PACK
10.0000 g | PACK | Freq: Once | ORAL | Status: AC
  Administered 2020-03-11: 10 g via ORAL
  Filled 2020-03-11: qty 1

## 2020-03-11 MED ORDER — FUROSEMIDE 10 MG/ML IJ SOLN
40.0000 mg | Freq: Once | INTRAMUSCULAR | Status: AC
  Administered 2020-03-11: 40 mg via INTRAVENOUS
  Filled 2020-03-11: qty 4

## 2020-03-11 MED ORDER — DICLOFENAC SODIUM 1 % EX GEL
2.0000 g | Freq: Two times a day (BID) | CUTANEOUS | Status: DC
  Administered 2020-03-14: 2 g via TOPICAL
  Filled 2020-03-11 (×4): qty 100

## 2020-03-11 MED ORDER — MAGNESIUM SULFATE 2 GM/50ML IV SOLN
2.0000 g | Freq: Once | INTRAVENOUS | Status: AC
  Administered 2020-03-11: 2 g via INTRAVENOUS
  Filled 2020-03-11: qty 50

## 2020-03-11 MED ORDER — SODIUM CHLORIDE 0.9 % IV BOLUS
500.0000 mL | Freq: Once | INTRAVENOUS | Status: AC
  Administered 2020-03-11: 500 mL via INTRAVENOUS

## 2020-03-11 MED ORDER — APIXABAN 5 MG PO TABS
5.0000 mg | ORAL_TABLET | Freq: Two times a day (BID) | ORAL | Status: DC
  Administered 2020-03-11 – 2020-03-16 (×10): 5 mg via ORAL
  Filled 2020-03-11 (×10): qty 1

## 2020-03-11 MED ORDER — DILTIAZEM HCL 25 MG/5ML IV SOLN
5.0000 mg | Freq: Once | INTRAVENOUS | Status: AC
  Administered 2020-03-11: 5 mg via INTRAVENOUS
  Filled 2020-03-11: qty 5

## 2020-03-11 MED ORDER — ACETAMINOPHEN 500 MG PO TABS
500.0000 mg | ORAL_TABLET | Freq: Once | ORAL | Status: AC
  Administered 2020-03-11: 500 mg via ORAL
  Filled 2020-03-11: qty 1

## 2020-03-11 MED ORDER — HYDRALAZINE HCL 20 MG/ML IJ SOLN: 5.0000 mg | INTRAMUSCULAR | Status: DC | PRN

## 2020-03-11 MED ORDER — ACETAMINOPHEN 325 MG PO TABS: 650.0000 mg | ORAL_TABLET | Freq: Four times a day (QID) | ORAL | Status: DC | PRN

## 2020-03-11 MED ORDER — ROPINIROLE HCL 0.5 MG PO TABS
0.5000 mg | ORAL_TABLET | Freq: Three times a day (TID) | ORAL | Status: DC | PRN
  Administered 2020-03-11 – 2020-03-13 (×5): 0.5 mg via ORAL
  Filled 2020-03-11 (×7): qty 1

## 2020-03-11 MED ORDER — DILTIAZEM HCL ER COATED BEADS 180 MG PO CP24
360.0000 mg | ORAL_CAPSULE | Freq: Every day | ORAL | Status: DC
  Administered 2020-03-11 – 2020-03-16 (×6): 360 mg via ORAL
  Filled 2020-03-11 (×4): qty 2
  Filled 2020-03-11: qty 1
  Filled 2020-03-11: qty 2

## 2020-03-11 MED ORDER — ACETAMINOPHEN 650 MG RE SUPP: 650.0000 mg | Freq: Four times a day (QID) | RECTAL | Status: DC | PRN

## 2020-03-11 MED ORDER — NYSTATIN 100000 UNIT/GM EX POWD
1.0000 "application " | Freq: Two times a day (BID) | CUTANEOUS | Status: DC
  Administered 2020-03-11 – 2020-03-16 (×10): 1 via TOPICAL
  Filled 2020-03-11: qty 15

## 2020-03-11 MED ORDER — ONDANSETRON HCL 4 MG PO TABS: 4.0000 mg | ORAL_TABLET | Freq: Four times a day (QID) | ORAL | Status: DC | PRN

## 2020-03-11 MED ORDER — SODIUM CHLORIDE 0.9% FLUSH: 3.0000 mL | Freq: Once | INTRAVENOUS | Status: DC

## 2020-03-11 MED ORDER — FENTANYL CITRATE (PF) 100 MCG/2ML IJ SOLN
12.5000 ug | INTRAMUSCULAR | Status: AC
  Administered 2020-03-11: 12.5 ug via INTRAVENOUS
  Filled 2020-03-11: qty 2

## 2020-03-11 NOTE — H&P (Addendum)
History and Physical    Gina Reilly [id number] DOB: 04-27-1924 DOA: 03/11/2020  Referring MD/NP/PA: Alfredia Client, PA-C PCP: Mosie Lukes, MD  Patient coming from: Home (lives alone)  Chief Complaint: Weakness  I have personally briefly reviewed patient's old medical records in [place]   HPI: Gina Reilly is a 86 y.o. female with medical history significant of chronic atrial fibrillation on Eliquis, hypertension, hyperlipidemia, hypothyroidism, and hyperglycemia who present with complaints of worsening weakness.  History is obtained from the patient with assistance of her daughter.  At baseline patient lives alone but over the last 2 weeks had been having a progressive decline.  She complains of worsening joint pain in her right hip and bilateral knees.  Normally she ambulates with the use of a rolling walker, but reports right leg still have been giving out.  This morning she nearly fell while trying to get on the toilet, but her daughter was present to keep her from falling.  Patient reports difficulty with swallowing food stating that it takes a long time to go down her throat.  She does not feel that she has had a stroke and would not really like to have it evaluated further at her age.  She is not on a modified diet.  Denies having any headaches, dizziness, palpitations, shortness of breath, recent falls, or chest pain.  This morning she was not able to take any of her regular medications prior to coming to the hospital.  Patient verifies that if her heart were to stop or she were to die during this hospitalization that she would not want CPR or any resuscitative efforts performed.  She would like to have a DO NOT RESUSCITATE order put in place.  ED Course: On admission into the emergency department patient was noted to be afebrile, pulse up to 147 in atrial fibrillation,  respirations 21-30, and blood pressure 152/131-176/152.  Initial labs showed potassium of 6.3, but  secondary to hemolysis and repeat check was within normal limits at 4.  Note any clear signs of infection.  Chest x-ray noted mild cardiomegaly with increased interstitial markings concerning for mild edema.  Patient was given 40 mg of Lasix IV and 10 g of Lokelma due to initial concern for hyperkalemia  Review of Systems  Constitutional: Positive for malaise/fatigue. Negative for chills and fever.  HENT: Negative for congestion and nosebleeds.   Eyes: Negative for photophobia and pain.  Respiratory: Negative for cough and shortness of breath.   Cardiovascular: Negative for chest pain and leg swelling.  Gastrointestinal: Negative for abdominal pain, diarrhea, nausea and vomiting.  Genitourinary: Negative for dysuria and hematuria.  Musculoskeletal: Positive for joint pain and myalgias. Negative for falls.  Skin: Positive for rash. Negative for itching.  Neurological: Positive for weakness. Negative for dizziness, focal weakness, loss of consciousness and headaches.  Psychiatric/Behavioral: Negative for memory loss and substance abuse.    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 07/2010 TEE/DCCV:  EF 60-65% mild MR.; b. Recurrent Afib--> Amiodarone;  Pradaxa (CHA2DS2VASc = 4);  c. Amio d/c'd 2/2 toxicity - now chronic rate-controlled afib.  Constipation 11/22/2014   Contusion of leg, left 01/16/2012   GERD (gastroesophageal reflux disease)    History of echocardiogram    a. 07/2015 Echo: EF 60-65%, Ao sclerosis, mild MR, mildly dil LA/RV/RA, mild Tr, PASP 57mmHg.   Hyperglycemia 01/10/2010   Qualifier: Diagnosis of  By: Niel Hummer MD, Lorinda Creed    Hyperlipidemia    Hypertension     Hyperthyroidism 02/09/2009   Qualifier: Diagnosis of  By: Niel Hummer MD, Lorinda Creed    Hypoxia 04/14/2012   Intermittent claudication ([place]) 05/22/2012   Measles as a child   Medicare annual wellness visit, subsequent 11/27/2014   Follows with pulmonology, Dr Gwenette Greet Follow with cardiology, Dr Rance Muir with Dr Matthew Saras Does not participate in [facility], pap or colonoscopy screening at this time.   Mumps as a child   Obese    Ovarian cyst    Pelvic mass in female    Peripheral neuropathy 02/26/2012   Precancerous skin lesion    Skin cancer    left temple   Thyroid disease    hypothroidism   Vitamin D deficiency     Past Surgical History:  Procedure Laterality Date   ABDOMINAL HYSTERECTOMY     CATARACT EXTRACTION     Dr. Satira Sark   SKIN BIOPSY     left temple. cancerous   TOTAL HIP ARTHROPLASTY  2003   left     reports that she quit smoking about 46 years ago. Her smoking use included cigarettes. She has a 1.50 pack-year smoking history. She has never used smokeless tobacco. She reports current alcohol use. She reports that she does not use drugs.  Allergies  Allergen Reactions   Amiodarone Other (See Comments)    Amiodarone pulmonary toxicity.   Aspercreme [Trolamine Salicylate] Other (See Comments)    Pt states it caused redness like it was burning her skin.    Family History  Problem Relation Age of Onset   Stroke Mother    Prostate cancer Father        prostate   Hypertension Brother    Cancer Maternal Grandmother    Hypertension Brother    Heart disease Brother    Obesity Brother    Hypertension Brother    Hyperlipidemia Brother    Heart disease Brother    Kidney disease Brother    Cancer Daughter        kidney- spine and blood stream   Heart disease Son        valvular heart disease   Prostate cancer Brother     Prior to Admission medications   Medication Sig Start Date End Date Taking? Authorizing Provider  acetaminophen  (TYLENOL) 500 MG tablet Take 3,000 mg by mouth daily.     [provider]  Cholecalciferol (VITAMIN D PO) Take 1 capsule by mouth daily.     [provider]  diltiazem (CARDIZEM CD) 360 MG 24 hr capsule Take 1 capsule (360 mg total) by mouth daily. Pt needs to make appt with provider for further refills 02/29/20   Lelon Perla, MD  Docusate Sodium (PHILLIPS STOOL SOFTENER PO) Take 1 tablet by mouth as needed.     [provider]  ELIQUIS 5 MG TABS tablet TAKE 1 TABLET TWICE A DAY 11/17/19   Lelon Perla, MD  furosemide (LASIX) 20 MG tablet Take 1 tablet (20 mg total) by mouth daily as needed. 12/20/19   Mosie Lukes, MD  losartan (COZAAR) 25 MG tablet Take 1 tablet (  25 mg total) by mouth daily. Pt needs to make appt with provider for further refills 02/29/20   Lelon Perla, MD  Probiotic Product (PROBIOTIC DAILY PO) Take 1 tablet by mouth daily.    [provider]  rOPINIRole (REQUIP) 0.5 MG tablet TAKE 1 TABLET(0.5 MG) BY MOUTH AT BEDTIME 01/16/20   Mosie Lukes, MD  terbinafine (LAMISIL AT) 1 % cream Apply 1 application topically 2 (two) times daily. 12/13/19   Mosie Lukes, MD  Vitamin D, Ergocalciferol, (DRISDOL) 1.25 MG (50000 UNIT) CAPS capsule Take 1 capsule (50,000 Units total) by mouth every 7 (seven) days. 12/03/19   Mosie Lukes, MD    Physical Exam:  Constitutional: Elderly female currently in no acute distress. Vitals:   03/11/20 1017 03/11/20 1138 03/11/20 1139 03/11/20 1200  BP: (!) 152/131  (!) 158/119 (!) 176/152  Pulse: 100  (!) 133 (!) 147  Resp: (!) 21  (!) 30   Temp: 97.9 F (36.6 C)     SpO2: 97%  95% 94%  Weight:  104.3 kg    Height:  5\' 8"  (1.727 m)     Eyes: PERRL, lids and conjunctivae normal ENMT: Mucous membranes are moist. Posterior pharynx clear of any exudate or lesions. Hard of hearing. Neck: normal, supple, no masses, no thyromegaly Respiratory: clear to auscultation bilaterally, no wheezing, no  crackles. Normal respiratory effort. No accessory muscle use.  Cardiovascular: Irregular irregular, no murmurs / rubs / gallops.  Trace bilateral lower extremity edema. 2+ pedal pulses. No carotid bruits.  Abdomen: no tenderness, no masses palpated. No hepatosplenomegaly. Bowel sounds positive.  Musculoskeletal: no clubbing / cyanosis.  Crepitation noted of the bilateral knees..  Skin: Erythema with satellite lesions underneath the pannus and groin area. Neurologic: CN 2-12 grossly intact. Sensation intact, DTR normal. Strength 5/5 in all 4.  Psychiatric: Normal judgment and insight. Alert and oriented x 3. Normal mood.     Labs on Admission: I have personally reviewed following labs and imaging studies  CBC: Recent Labs  Lab 03/11/20 1023  WBC 8.0  HGB 13.8  HCT 43.5  MCV 106.1*  PLT [id number]   Basic Metabolic Panel: Recent Labs  Lab 03/11/20 1023 03/11/20 1229  NA 136  --   K 6.3* 4.0  CL 102  --   CO2 25  --   GLUCOSE 135*  --   BUN 17  --   CREATININE 0.93  --   CALCIUM 9.6  --    GFR: Estimated Creatinine Clearance: 46.8 mL/min (by C-G formula based on SCr of 0.93 mg/dL). Liver Function Tests: No results for input(s): AST, ALT, ALKPHOS, BILITOT, PROT, ALBUMIN in the last 168 hours. No results for input(s): LIPASE, AMYLASE in the last 168 hours. No results for input(s): AMMONIA in the last 168 hours. Coagulation Profile: No results for input(s): INR, PROTIME in the last 168 hours. Cardiac Enzymes: No results for input(s): CKTOTAL, CKMB, CKMBINDEX, TROPONINI in the last 168 hours. BNP (last 3 results) No results for input(s): PROBNP in the last 8760 hours. HbA1C: No results for input(s): HGBA1C in the last 72 hours. CBG: Recent Labs  Lab 03/11/20 1117  GLUCAP 130*   Lipid Profile: No results for input(s): CHOL, HDL, LDLCALC, TRIG, CHOLHDL, LDLDIRECT in the last 72 hours. Thyroid Function Tests: No results for input(s): TSH, T4TOTAL, FREET4, T3FREE, THYROIDAB  in the last 72 hours. Anemia Panel: No results for input(s): VITAMINB12, FOLATE, FERRITIN, TIBC, IRON, RETICCTPCT in the last 72 hours.  Urine analysis:    Component Value Date/Time   COLORURINE YELLOW 03/11/2020 1229   APPEARANCEUR HAZY (A) 03/11/2020 1229   LABSPEC 1.012 03/11/2020 1229   PHURINE 5.0 03/11/2020 1229   GLUCOSEU NEGATIVE 03/11/2020 1229   GLUCOSEU NEGATIVE 10/14/2016 1355   HGBUR NEGATIVE 03/11/2020 1229   HGBUR negative 04/05/2010 [place] 03/11/2020 1229   BILIRUBINUR neg 12/25/2012 1432   KETONESUR NEGATIVE 03/11/2020 1229   PROTEINUR 100 (A) 03/11/2020 1229   UROBILINOGEN 0.2 10/14/2016 1355   NITRITE NEGATIVE 03/11/2020 1229   LEUKOCYTESUR NEGATIVE 03/11/2020 1229   Sepsis Labs: No results found for this or any previous visit (from the past 240 hour(s)).   Radiological Exams on Admission: DG Chest Port 1 View  Result Date: 03/11/2020 CLINICAL DATA:  Weakness EXAM: PORTABLE CHEST 1 VIEW COMPARISON:  11/11/2017 FINDINGS: Mild cardiomegaly. Atherosclerotic calcification of the aortic knob. Mildly prominent perihilar and bibasilar interstitial markings. No lobar consolidation. No pleural effusion or pneumothorax. Degenerative changes of the bilateral shoulders. IMPRESSION: 1. Mildly prominent perihilar and bibasilar interstitial markings, could reflect mild edema or atypical/viral infection. 2. Mild cardiomegaly. Electronically Signed   By: Davina Poke D.O.   On: 03/11/2020 13:04    EKG: Independently reviewed.  Atrial fibrillation with RVR at 118 bpm  Assessment/Plan Generalized weakness, osteoarthritis: Patient presents with complaints of generalized weakness with leg giving out due to joint pain.  She denied having any falls. -Admit to a telemetry bed -Check TSH   -Check x-rays of the pelvis -PT/OT to eval and treat -Voltaren gel as needed for joint pain -Transitions of care consult for need of home health care   Persistent atrial  fibrillation on chronic anticoagulation: On admission patient was noted to be in A. fib with RVR with heart rates into the 150s.  She is on anticoagulation of Eliquis, but had not taken her morning diltiazem. -Continue Eliquis and diltiazem -Goal potassium 4 and magnesium 2.  Hypertensive urgency: Acute.  On admission patient's blood pressure is elevated up to 179/106.  Home blood pressure medications include diltiazem 360 mg daily and losartan 25 mg daily. -Continue home blood pressure medications -Hydralazine IV as needed for elevated blood pressures  Suspected pulmonary edema: Acute.  There was initial concern for possible edema on chest x-ray.  Suspect secondary to atrial fibrillation with RVR.  Patient does not appear grossly fluid overload.  Last echocardiogram from 07/2015 showed EF of 60-65%.  She received 1 dose of Lasix 40 mg in the ED. patient normally is not on Lasix at home. -Monitor intake and output -Daily weight -BNP was within normal limits 72.5  Elevated troponin: Cardiac troponins appear to be mildly elevated at 23->25.  Patient denies any complaints of chest pain and EKG did not show any acute ischemic changes. -Continue to monitor  Hypomagnesemia: Acute.  Magnesium initially noted to be as low as 1.6. -Give 2 g of magnesium sulfate IV x1 dose now Continue to monitor and replace as needed-  Candidiasis ofskin: Acute.  Noted on physical exam of the pannus and groin area. -Nystatin powder   Restless leg syndrome -Continue ropinol  Obesity: BMI 34.97 kg/m  DO NOT RESUSCITATE: Present on admission  DVT prophylaxis: Eliquis Code Status: DNR Family Communication: Daughter updated bedside Disposition Plan: To be determined Consults called: None Admission status: Observation  Norval Morton MD [facility] Pager [phone]   If 7PM-7AM, please contact night-coverage [email or web address] Password [email or web address]  03/11/2020, 1:52 PM

## 2020-03-11 NOTE — ED Notes (Signed)
Pt states she is having increased trouble in swallowing.

## 2020-03-11 NOTE — ED Provider Notes (Signed)
[facility] EMERGENCY DEPARTMENT Provider Note   CSN: [id number] Arrival date & time: 03/11/20  1005     History Chief Complaint  Patient presents with  . Weakness    Gina Reilly is a 86 y.o. female with pertinent past medical history of arthritis, chronic A. fib on Dilt and Eliquis, hyperlipidemia, hypertension, hypothyroid, hyperglycemia presents emergency department today for weakness for the past 2 weeks. In normal health before this.  Patient is present with one of her daughters who is able to tell most of the history.  Patient has been declining for the past 2 weeks, states that she felt so weak today she had to come in.  Patient did not fall or have near syncope.  No loss of consciousness.  Has been unable to shower for the past couple of days due to her weakness.  Denies any chest pain, shortness of breath, abdominal pain, nausea, vomiting, back pain.  Patient has chronic joint pain in hips and knees.  Patient normally lives at home alone and has meal prep and laundry service, however does not have home health care.  Daughter states that she comes only on the weekends.  They just had a discussion with PCP for goals of care and decided that she needs to be in a skilled nursing facility.  States that she has been urinating normally.  Patient states that she has not been eating as much due to some trouble in swallowing that she has had chronically.  (Saw patient drink Lokelma in the room without any trouble swallowing).  Denies any fevers or chills.  Is medication compliant.  Daughter did not notice any decrease of mental state, states that she has been acting normally besides her weakness. HPI     Past Medical History:  Diagnosis Date  . 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 Anemia   . Apnea, sleep 04/24/2012  . Arthritis    osteo  . Bronchitis 12/25/2011    . Chicken pox as a child  . Chronic atrial fibrillation ([place])    a. 07/2010 TEE/DCCV:  EF 60-65% mild MR.; b. Recurrent Afib--> Amiodarone;  Pradaxa (CHA2DS2VASc = 4);  c. Amio d/c'd 2/2 toxicity - now chronic rate-controlled afib.  . Constipation 11/22/2014  . Contusion of leg, left 01/16/2012  . GERD (gastroesophageal reflux disease)   . History of echocardiogram    a. 07/2015 Echo: EF 60-65%, Ao sclerosis, mild MR, mildly dil LA/RV/RA, mild Tr, PASP 35mmHg.  Marland Kitchen Hyperglycemia 01/10/2010   Qualifier: Diagnosis of  By: Niel Hummer MD, [place] Hyperlipidemia   . Hypertension   . Hyperthyroidism 02/09/2009   Qualifier: Diagnosis of  By: Niel Hummer MD, [place] Hypoxia 04/14/2012  . Intermittent claudication ([place]) 05/22/2012  . Measles as a child  . Medicare annual wellness visit, subsequent 11/27/2014   Follows with pulmonology, Dr Gwenette Greet Follow with cardiology, Dr Rance Muir with Dr Matthew Saras Does not participate in [place], pap or colonoscopy screening at this time.  . Mumps as a child  . Obese   . Ovarian cyst   . Pelvic mass in female   . Peripheral neuropathy 02/26/2012  . Precancerous skin lesion   . Skin cancer    left temple  . Thyroid disease    hypothroidism  . Vitamin D deficiency     Patient Active  Problem List   Diagnosis Date Noted  . RLS (restless legs syndrome) 10/28/2019  . Pain of right hip joint 11/12/2017  . Unsteady gait 11/12/2017  . Fingernail abnormalities 11/11/2017  . Decreased visual acuity 01/25/2016  . Medicare annual wellness visit, subsequent 11/27/2014  . Constipation 11/22/2014  . 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 Intermittent claudication ([place]) 05/22/2012  . Cough 05/18/2012  . Chronic diastolic heart failure ([place]) 05/12/2012  . OSA (obstructive sleep apnea) 04/24/2012  . LVH (left ventricular hypertrophy) 04/24/2012  . Hypertension   . Precancerous skin lesion   . Pelvic mass in female   . Debility 01/10/2011  . ATRIAL  FIBRILLATION 08/14/2010  . URETHRAL CARUNCLE 04/05/2010  . Osteoarthritis 03/23/2010  . Hyperglycemia 01/10/2010  . GERD 01/10/2010  . Hyperthyroidism 02/09/2009  . Vitamin D deficiency 02/09/2009  . Hyperlipidemia 02/09/2009  . Obesity 02/09/2009  . Anemia, unspecified 02/09/2009  . DERMATITIS 02/09/2009  . Arthritis 02/09/2009  . URINARY FREQUENCY 02/09/2009  . UNS ADVRS EFF OTH RX MEDICINAL&BIOLOGICAL SBSTNC 02/09/2009    Past Surgical History:  Procedure Laterality Date  . ABDOMINAL HYSTERECTOMY    . CATARACT EXTRACTION     Dr. Satira Sark  . SKIN BIOPSY     left temple. cancerous  . TOTAL HIP ARTHROPLASTY  2003   left     OB History   No obstetric history on file.     Family History  Problem Relation Age of Onset  . Stroke Mother   . Prostate cancer Father        prostate  . Hypertension Brother   . Cancer Maternal Grandmother   . Hypertension Brother   . Heart disease Brother   . Obesity Brother   . Hypertension Brother   . Hyperlipidemia Brother   . Heart disease Brother   . Kidney disease Brother   . Cancer Daughter        kidney- spine and blood stream  . Heart disease Son        valvular heart disease  . Prostate cancer Brother     Social History   Tobacco Use  . Smoking status: Former Smoker    Packs/day: 0.30    Years: 5.00    Pack years: 1.50    Types: Cigarettes    Quit date: 10/28/1973    Years since quitting: 46.4  . Smokeless tobacco: Never Used  Substance Use Topics  . Alcohol use: Yes    Comment: socially  . Drug use: No    Home Medications Prior to Admission medications   Medication Sig Start Date End Date Taking? Authorizing Provider  acetaminophen (TYLENOL) 500 MG tablet Take 3,000 mg by mouth daily.     [provider]  Cholecalciferol (VITAMIN D PO) Take 1 capsule by mouth daily.     [provider]  diltiazem (CARDIZEM CD) 360 MG 24 hr capsule Take 1 capsule (360 mg total) by mouth daily. Pt needs to make  appt with provider for further refills 02/29/20   Lelon Perla, MD  Docusate Sodium (PHILLIPS STOOL SOFTENER PO) Take 1 tablet by mouth as needed.     [provider]  ELIQUIS 5 MG TABS tablet TAKE 1 TABLET TWICE A DAY 11/17/19   Lelon Perla, MD  furosemide (LASIX) 20 MG tablet Take 1 tablet (20 mg total) by mouth daily as needed. 12/20/19   Mosie Lukes, MD  losartan (COZAAR) 25 MG tablet Take 1 tablet (25 mg total) by mouth daily.  Pt needs to make appt with provider for further refills 02/29/20   Lelon Perla, MD  Probiotic Product (PROBIOTIC DAILY PO) Take 1 tablet by mouth daily.    [provider]  rOPINIRole (REQUIP) 0.5 MG tablet TAKE 1 TABLET(0.5 MG) BY MOUTH AT BEDTIME 01/16/20   Mosie Lukes, MD  terbinafine (LAMISIL AT) 1 % cream Apply 1 application topically 2 (two) times daily. 12/13/19   Mosie Lukes, MD  Vitamin D, Ergocalciferol, (DRISDOL) 1.25 MG (50000 UNIT) CAPS capsule Take 1 capsule (50,000 Units total) by mouth every 7 (seven) days. 12/03/19   Mosie Lukes, MD    Allergies    Amiodarone and Aspercreme [trolamine salicylate]  Review of Systems   Review of Systems  Constitutional: Positive for fatigue. Negative for chills, diaphoresis and fever.  HENT: Negative for congestion, sore throat and trouble swallowing.   Eyes: Negative for pain and visual disturbance.  Respiratory: Negative for cough, shortness of breath and wheezing.   Cardiovascular: Negative for chest pain, palpitations and leg swelling.  Gastrointestinal: Negative for abdominal distention, abdominal pain, diarrhea, nausea and vomiting.  Genitourinary: Negative for difficulty urinating.  Musculoskeletal: Negative for back pain, neck pain and neck stiffness.  Skin: Negative for pallor.  Neurological: Negative for dizziness, tremors, syncope, facial asymmetry, speech difficulty, weakness, light-headedness and headaches.  Psychiatric/Behavioral: Negative for confusion.     Physical Exam Updated Vital Signs BP (!) 158/119 (BP Location: Right Wrist)   Pulse (!) 133   Temp 97.9 F (36.6 C)   Resp (!) 30   Ht 5\' 8"  (1.727 m)   Wt 104.3 kg   LMP 12/25/2011   SpO2 95%   BMI 34.97 kg/m   Physical Exam Constitutional:      General: She is not in acute distress.    Appearance: Normal appearance. She is not ill-appearing, toxic-appearing or diaphoretic.  HENT:     Mouth/Throat:     Mouth: Mucous membranes are moist.     Pharynx: Oropharynx is clear.  Eyes:     General: No scleral icterus.    Extraocular Movements: Extraocular movements intact.     Pupils: Pupils are equal, round, and reactive to light.  Cardiovascular:     Rate and Rhythm: Normal rate and regular rhythm.     Pulses: Normal pulses.     Heart sounds: Normal heart sounds.  Pulmonary:     Effort: Pulmonary effort is normal. No respiratory distress.     Breath sounds: Normal breath sounds. No stridor. No wheezing, rhonchi or rales.  Chest:     Chest wall: No tenderness.  Abdominal:     General: Abdomen is flat. There is no distension.     Palpations: Abdomen is soft.     Tenderness: There is no abdominal tenderness. There is no guarding or rebound.  Musculoskeletal:        General: No swelling or tenderness. Normal range of motion.     Cervical back: Normal range of motion and neck supple. No rigidity.     Right lower leg: No edema.     Left lower leg: No edema.  Skin:    General: Skin is warm and dry.     Capillary Refill: Capillary refill takes less than 2 seconds.     Coloration: Skin is not jaundiced or pale.     Findings: Rash (Rash with erythema and satteline region around groin suggestive of candida ) present. No bruising.  Neurological:     General: No  focal deficit present.     Mental Status: She is alert and oriented to person, place, and time. Mental status is at baseline.     Cranial Nerves: No cranial nerve deficit or facial asymmetry.     Sensory: Sensation is  intact. No sensory deficit.     Motor: Weakness (Pt is unable to lift either leg, Normal strength of bialteral upper extremity ) present. No tremor.  Psychiatric:        Mood and Affect: Mood normal.        Behavior: Behavior normal.     ED Results / Procedures / Treatments   Labs (all labs ordered are listed, but only abnormal results are displayed) Labs Reviewed  BASIC METABOLIC PANEL - Abnormal; Notable for the following components:      Result Value   Potassium 6.3 (*)    Glucose, Bld 135 (*)    GFR calc non Af Amer 53 (*)    All other components within normal limits  CBC - Abnormal; Notable for the following components:   MCV 106.1 (*)    All other components within normal limits  CBG MONITORING, ED - Abnormal; Notable for the following components:   Glucose-Capillary 130 (*)    All other components within normal limits  URINALYSIS, ROUTINE W REFLEX MICROSCOPIC  POTASSIUM    EKG EKG Interpretation  Date/Time:  Saturday Mar 11 2020 10:13:12 EDT Ventricular Rate:  118 PR Interval:    QRS Duration: 66 QT Interval:  324 QTC Calculation: 454 R Axis:   -28 Text Interpretation: Atrial fibrillation with rapid ventricular response Low voltage QRS Septal infarct , age undetermined Abnormal ECG When compared to prior, similar afib with faster rate. No STEMI Confirmed by Antony Blackbird [phone]) on 03/11/2020 11:40:32 AM   Radiology No results found.  Procedures Procedures (including critical care time)  Medications Ordered in ED Medications  sodium chloride flush (NS) 0.9 % injection 3 mL (3 mLs Intravenous Not Given 03/11/20 1214)  sodium chloride 0.9 % bolus 500 mL (500 mLs Intravenous New Bag/Given 03/11/20 1209)  sodium zirconium cyclosilicate (LOKELMA) packet 10 g (10 g Oral Given 03/11/20 1210)  furosemide (LASIX) injection 40 mg (40 mg Intravenous Given 03/11/20 1210)    ED Course  I have reviewed the triage vital signs and the nursing notes.  Pertinent labs &  imaging results that were available during my care of the patient were reviewed by me and considered in my medical decision making (see chart for details).    MDM Rules/Calculators/A&P                     . Keaghan N Duprey is a 86 y.o. female with pertinent past medical history of arthritis, chronic A. fib on eliquis, hyperlipidemia, hypertension, hypothyroid, hyperglycemia presents emergency department today for weakness for the past 2 weeks. PE suggestive of weakness of bilateral lower legs, otherwise normal neuro exam.   CBC with K of 6.3. Pt is not complaining of chest pain, shortness of breath, nausea, vomiting, abdominal pain, AMS. Will initiate Lasix, IV fluids, Lokelma and repeat K. EKG with no changes, pt is in A fib with RVR, has not taken her Dilt yet.   Repeat K 4. K was drawn 20 minutes after initiation of above medications, therefore orginal K was most likely hemolyzed.   Pt with new onset weakness, 27 who lives alone. Pt will need to come in to the hospital for this. Labs with normal CBC and CMP.  Other labs pending. Nystatin powder given for Candida intertriginous folds near the groin.    1:52 PM discussed case with hospitalist, Dr. Tamala Julian who agrees to accept care of patient.  The patient appears reasonably stabilized for admission considering the current resources, flow, and capabilities available in the ED at this time, and I doubt any other [facility] requiring further screening and/or treatment in the ED prior to admission.   I discussed this case with my attending physician, Dr. Gustavus Messing, who cosigned this note including patient's presenting symptoms, physical exam, and planned diagnostics and interventions. Attending physician stated agreement with plan or made changes to plan which were implemented.   Final Clinical Impression(s) / ED Diagnoses Final diagnoses:  None    Rx / DC Orders ED Discharge Orders    None       Alfredia Client, PA-C 03/11/20 2014    Tegeler,  Gwenyth Allegra, MD 03/12/20 [phone]

## 2020-03-11 NOTE — ED Triage Notes (Addendum)
Pt arrives via [facility] from home with c/o generalized weakness that has progressed over the past few days. Pt also endorses joint pain in her legs. Family reports they are trying to have home health care set up for patient. Pt wears 5L O2 at baseline. Pt a/ox4.

## 2020-03-12 DIAGNOSIS — Z6834 Body mass index (BMI) 34.0-34.9, adult: Secondary | ICD-10-CM | POA: Diagnosis not present

## 2020-03-12 DIAGNOSIS — E785 Hyperlipidemia, unspecified: Secondary | ICD-10-CM | POA: Diagnosis present

## 2020-03-12 DIAGNOSIS — K219 Gastro-esophageal reflux disease without esophagitis: Secondary | ICD-10-CM | POA: Diagnosis present

## 2020-03-12 DIAGNOSIS — E039 Hypothyroidism, unspecified: Secondary | ICD-10-CM | POA: Diagnosis present

## 2020-03-12 DIAGNOSIS — E876 Hypokalemia: Secondary | ICD-10-CM | POA: Diagnosis present

## 2020-03-12 DIAGNOSIS — R531 Weakness: Secondary | ICD-10-CM | POA: Diagnosis present

## 2020-03-12 DIAGNOSIS — R778 Other specified abnormalities of plasma proteins: Secondary | ICD-10-CM | POA: Diagnosis not present

## 2020-03-12 DIAGNOSIS — R32 Unspecified urinary incontinence: Secondary | ICD-10-CM | POA: Diagnosis present

## 2020-03-12 DIAGNOSIS — M1990 Unspecified osteoarthritis, unspecified site: Secondary | ICD-10-CM | POA: Diagnosis present

## 2020-03-12 DIAGNOSIS — E875 Hyperkalemia: Secondary | ICD-10-CM | POA: Diagnosis present

## 2020-03-12 DIAGNOSIS — I5032 Chronic diastolic (congestive) heart failure: Secondary | ICD-10-CM | POA: Diagnosis present

## 2020-03-12 DIAGNOSIS — I4891 Unspecified atrial fibrillation: Secondary | ICD-10-CM | POA: Diagnosis not present

## 2020-03-12 DIAGNOSIS — Z66 Do not resuscitate: Secondary | ICD-10-CM

## 2020-03-12 DIAGNOSIS — E669 Obesity, unspecified: Secondary | ICD-10-CM | POA: Diagnosis present

## 2020-03-12 DIAGNOSIS — I160 Hypertensive urgency: Secondary | ICD-10-CM | POA: Diagnosis present

## 2020-03-12 DIAGNOSIS — Z7189 Other specified counseling: Secondary | ICD-10-CM | POA: Diagnosis not present

## 2020-03-12 DIAGNOSIS — J811 Chronic pulmonary edema: Secondary | ICD-10-CM | POA: Diagnosis present

## 2020-03-12 DIAGNOSIS — I10 Essential (primary) hypertension: Secondary | ICD-10-CM | POA: Diagnosis not present

## 2020-03-12 DIAGNOSIS — Z789 Other specified health status: Secondary | ICD-10-CM | POA: Diagnosis not present

## 2020-03-12 DIAGNOSIS — R9389 Abnormal findings on diagnostic imaging of other specified body structures: Secondary | ICD-10-CM

## 2020-03-12 DIAGNOSIS — I4819 Other persistent atrial fibrillation: Secondary | ICD-10-CM | POA: Diagnosis present

## 2020-03-12 DIAGNOSIS — I4811 Longstanding persistent atrial fibrillation: Secondary | ICD-10-CM | POA: Diagnosis not present

## 2020-03-12 DIAGNOSIS — Z515 Encounter for palliative care: Secondary | ICD-10-CM | POA: Diagnosis not present

## 2020-03-12 DIAGNOSIS — B372 Candidiasis of skin and nail: Secondary | ICD-10-CM | POA: Diagnosis present

## 2020-03-12 DIAGNOSIS — Z7401 Bed confinement status: Secondary | ICD-10-CM | POA: Diagnosis not present

## 2020-03-12 DIAGNOSIS — G4733 Obstructive sleep apnea (adult) (pediatric): Secondary | ICD-10-CM | POA: Diagnosis present

## 2020-03-12 DIAGNOSIS — I110 Hypertensive heart disease with heart failure: Secondary | ICD-10-CM | POA: Diagnosis present

## 2020-03-12 DIAGNOSIS — M158 Other polyosteoarthritis: Secondary | ICD-10-CM | POA: Diagnosis not present

## 2020-03-12 DIAGNOSIS — Z7901 Long term (current) use of anticoagulants: Secondary | ICD-10-CM | POA: Diagnosis not present

## 2020-03-12 DIAGNOSIS — G2581 Restless legs syndrome: Secondary | ICD-10-CM | POA: Diagnosis present

## 2020-03-12 DIAGNOSIS — M2550 Pain in unspecified joint: Secondary | ICD-10-CM | POA: Diagnosis not present

## 2020-03-12 DIAGNOSIS — Z6838 Body mass index (BMI) 38.0-38.9, adult: Secondary | ICD-10-CM | POA: Diagnosis not present

## 2020-03-12 DIAGNOSIS — B373 Candidiasis of vulva and vagina: Secondary | ICD-10-CM | POA: Diagnosis present

## 2020-03-12 DIAGNOSIS — I248 Other forms of acute ischemic heart disease: Secondary | ICD-10-CM | POA: Diagnosis not present

## 2020-03-12 DIAGNOSIS — G629 Polyneuropathy, unspecified: Secondary | ICD-10-CM | POA: Diagnosis present

## 2020-03-12 DIAGNOSIS — G8929 Other chronic pain: Secondary | ICD-10-CM | POA: Diagnosis present

## 2020-03-12 LAB — CBC
HCT: 38.7 % (ref 36.0–46.0)
Hemoglobin: 12.3 g/dL (ref 12.0–15.0)
MCH: 32.9 pg (ref 26.0–34.0)
MCHC: 31.8 g/dL (ref 30.0–36.0)
MCV: 103.5 fL — ABNORMAL HIGH (ref 80.0–100.0)
Platelets: 180 10*3/uL (ref 150–400)
RBC: 3.74 MIL/uL — ABNORMAL LOW (ref 3.87–5.11)
RDW: 14.4 % (ref 11.5–15.5)
WBC: 7.5 10*3/uL (ref 4.0–10.5)
nRBC: 0 % (ref 0.0–0.2)

## 2020-03-12 LAB — BASIC METABOLIC PANEL
Anion gap: 9 (ref 5–15)
BUN: 11 mg/dL (ref 8–23)
CO2: 24 mmol/L (ref 22–32)
Calcium: 8.9 mg/dL (ref 8.9–10.3)
Chloride: 103 mmol/L (ref 98–111)
Creatinine, Ser: 0.84 mg/dL (ref 0.44–1.00)
GFR calc Af Amer: >60 mL/min (ref >60)
GFR calc non Af Amer: 59 mL/min — ABNORMAL LOW (ref >60)
Glucose, Bld: 125 mg/dL — ABNORMAL HIGH (ref 70–99)
Potassium: 3.3 mmol/L — ABNORMAL LOW (ref 3.5–5.1)
Sodium: 136 mmol/L (ref 135–145)

## 2020-03-12 LAB — HEPATIC FUNCTION PANEL
ALT: 18 U/L (ref 0–44)
AST: 20 U/L (ref 15–41)
Albumin: 3.3 g/dL — ABNORMAL LOW (ref 3.5–5.0)
Alkaline Phosphatase: 46 U/L (ref 38–126)
Bilirubin, Direct: 0.1 mg/dL (ref 0.0–0.2)
Indirect Bilirubin: 1 mg/dL — ABNORMAL HIGH (ref 0.3–0.9)
Total Bilirubin: 1.1 mg/dL (ref 0.3–1.2)
Total Protein: 5.8 g/dL — ABNORMAL LOW (ref 6.5–8.1)

## 2020-03-12 LAB — MAGNESIUM: Magnesium: 1.9 mg/dL (ref 1.7–2.4)

## 2020-03-12 MED ORDER — ACETAMINOPHEN 500 MG PO TABS
1000.0000 mg | ORAL_TABLET | Freq: Four times a day (QID) | ORAL | Status: DC
  Administered 2020-03-12 – 2020-03-16 (×18): 1000 mg via ORAL
  Filled 2020-03-12 (×19): qty 2

## 2020-03-12 MED ORDER — POTASSIUM CHLORIDE CRYS ER 20 MEQ PO TBCR
40.0000 meq | EXTENDED_RELEASE_TABLET | ORAL | Status: AC
  Administered 2020-03-12 (×2): 40 meq via ORAL
  Filled 2020-03-12 (×2): qty 2

## 2020-03-12 NOTE — Evaluation (Signed)
Occupational Therapy Evaluation Patient Details Name: Gina Reilly MRN: [id number] DOB: 12-Aug-1924 Today's Date: 03/12/2020    History of Present Illness Gina Reilly is a 86 y.o. female with medical history significant of chronic atrial fibrillation on Eliquis, hypertension, hyperlipidemia, hypothyroidism, and hyperglycemia who present with complaints of worsening weakness.   Clinical Impression   PTA pt living alone, mostly independent for BADL/IADL. Pt has history of worsening arthritic R knee and hip pain. She now states that it is unable to bear weight and is 10/10 pain. At time of eval, pt completing bed mobility at max A +2. Once coming to EOB (able to tolerate <5 minutes of sitting), pt very anxious and having little tolerance for pain. Sheets were changed due to them being soiled with urine with assist from both OT, PT, daughter, and CNA. Pt rolled in bed with max A +2 for peri care and bed change. She was not willing or able to attempt standing this session. Throughout session, pt and daughter requested we focus on "small goals" to help motivate the patient. Education given to daughter on high level care needs and that pt will need 24/7 physical assist if she were to return home. Recommend SNF for continued progression of BADL prior to returning home, but unsure if pt and daughter will comply. She will then need max HH services and the following DME listed below. Will continue to follow acutely.    Follow Up Recommendations  SNF;Supervision/Assistance - 24 hour;Other (comment)(unless family willing and able to provide 24/7 total care)    Equipment Recommendations  3 in 1 bedside commode;Wheelchair (measurements OT);Wheelchair cushion (measurements OT);Hospital bed;Other (comment)(hoyer lift with hoyer lift pads)    Recommendations for Other Services       Precautions / Restrictions Precautions Precautions: Fall Precaution Comments: painful RLE Restrictions Weight Bearing  Restrictions: No      Mobility Bed Mobility Overal bed mobility: Needs Assistance Bed Mobility: Rolling;Supine to Sit;Sit to Supine Rolling: Max assist;+2 for physical assistance   Supine to sit: Max assist;+2 for physical assistance;+2 for safety/equipment;HOB elevated Sit to supine: Total assist;+2 for physical assistance;+2 for safety/equipment   General bed mobility comments: as dtr states goal is to return home with palliative care, attempted to educate pt on use of bed controls to maximize independence; with HOB elevated, and +2 and max verbal and tactile cues and increased time, able to assist to EOB.  Pt in increased pain and unable to bear wt on R hip due to pain, tolerated long enough to change urine-soaked sheets and then assisted back to supine; rolling L/R with assist, rails, and bed pad and CNA arrived to assist with remainder of care needs and repositioning. Pt tearful and anxious during, became relaxed and comfortable once repositioned well.   Transfers                 General transfer comment: unable    Balance Overall balance assessment: History of Falls;Needs assistance Sitting-balance support: Bilateral upper extremity supported;Feet supported Sitting balance-Leahy Scale: Poor                                     ADL either performed or assessed with clinical judgement   ADL Overall ADL's : Needs assistance/impaired Eating/Feeding: Set up;Bed level   Grooming: Set up;Bed level   Upper Body Bathing: Moderate assistance;Sitting;Bed level   Lower Body Bathing: Total assistance;Sitting/lateral leans;Bed level  Upper Body Dressing : Moderate assistance;Sitting;Bed level   Lower Body Dressing: Total assistance;Bed level Lower Body Dressing Details (indicate cue type and reason): with with extreme RLE pain limiting ability to engage in LB BADL without total assist   Toilet Transfer Details (indicate cue type and reason): unable to attempt  due to pt anxiety. Transfer was max A +2 to come to EOB, pt insistent her RLE does not bear weight anymore and does not have the strength and endurance to scoot to a chair Toileting- Clothing Manipulation and Hygiene: Total assistance Toileting - Clothing Manipulation Details (indicate cue type and reason): incontinent of urine with soiled sheets     Functional mobility during ADLs: Maximal assistance;+2 for physical assistance;+2 for safety/equipment(EOB only)       Vision Baseline Vision/History: Wears glasses Wears Glasses: At all times Patient Visual Report: No change from baseline       Perception     Praxis      Pertinent Vitals/Pain Pain Assessment: Faces Faces Pain Scale: Hurts worst Pain Location: RLE, esp hip>knee Pain Descriptors / Indicators: Crying;Grimacing;Guarding;Moaning Pain Intervention(s): Limited activity within patient's tolerance;Monitored during session;Repositioned;Utilized relaxation techniques     Hand Dominance     Extremity/Trunk Assessment Upper Extremity Assessment Upper Extremity Assessment: Generalized weakness   Lower Extremity Assessment Lower Extremity Assessment: Defer to PT evaluation RLE Deficits / Details: OA-related pain in hip and knee primarily resulting in inability to move against gravity and minimal ability to slide abd/add; does flex/extend ankle RLE: Unable to fully assess due to pain       Communication Communication Communication: No difficulties   Cognition Arousal/Alertness: Awake/alert Behavior During Therapy: Anxious Overall Cognitive Status: Within Functional Limits for tasks assessed                                 General Comments: Pt anxious and uncomfortable on arrival, attempting to scoot up in bed unsuccessfully; anxious and fearful about pain with movement, daughter actively encouraging and motivating patient; once repositioned in supine in dry sheets pt became relaxed and less anxious    General Comments  Bed soaked in urine, sheets and gown changed and CNA repositioned purewick to manage. Discussion with daughter about goals of care, she would like to take pt home, and is also concerned about pt truly understanding extent of deficits, asks to keep focus on simple goals to motivate patient.  Unclear if, after seeing how difficult pt mobility was, if daughter is also aware of the caregiver burden. See clinical impression statement re: recommendations for postacute rehab needs;    Exercises Exercises: Other exercises Other Exercises Other Exercises: instructed pt to engage in DF and quad sets frequently   Shoulder Instructions      Home Living Family/patient expects to be discharged to:: Unsure Living Arrangements: Alone Available Help at Discharge: Family;Available 24 hours/day Type of Home: House                           Additional Comments: Daughter is SLP, goal is to return home after palliative care c/s, see other notes. Full set up not recieved due to nature of session. Facility may need to be considered      Prior Functioning/Environment Level of Independence: Independent        Comments: recent progressive weakness due to arthritic pain in RLE        OT Problem  List: Decreased strength;Decreased knowledge of use of DME or AE;Decreased knowledge of precautions;Decreased activity tolerance;Impaired balance (sitting and/or standing);Cardiopulmonary status limiting activity;Pain      OT Treatment/Interventions: Self-care/ADL training;Therapeutic exercise;Patient/family education;Balance training;Energy conservation;Therapeutic activities;DME and/or AE instruction    OT Goals(Current goals can be found in the care plan section) Acute Rehab OT Goals Patient Stated Goal: no pain, to be able to use the bathroom OT Goal Formulation: With patient/family Time For Goal Achievement: 03/26/20 Potential to Achieve Goals: Good  OT Frequency: Min 2X/week    Barriers to D/C:            Co-evaluation PT/OT/SLP Co-Evaluation/Treatment: Yes Reason for Co-Treatment: Necessary to address cognition/behavior during functional activity;For patient/therapist safety;To address functional/ADL transfers PT goals addressed during session: Mobility/safety with mobility;Balance OT goals addressed during session: ADL's and self-care;Proper use of Adaptive equipment and DME;Strengthening/ROM      AM-PAC OT "6 Clicks" Daily Activity     Outcome Measure Help from another person eating meals?: A Little Help from another person taking care of personal grooming?: A Little Help from another person toileting, which includes using toliet, bedpan, or urinal?: Total Help from another person bathing (including washing, rinsing, drying)?: A Lot Help from another person to put on and taking off regular upper body clothing?: A Lot Help from another person to put on and taking off regular lower body clothing?: Total 6 Click Score: 12   End of Session Nurse Communication: Mobility status  Activity Tolerance: Patient limited by pain Patient left: in bed;with call bell/phone within reach;with family/visitor present  OT Visit Diagnosis: Unsteadiness on feet (R26.81);Other abnormalities of gait and mobility (R26.89);Muscle weakness (generalized) (M62.81);History of falling (Z91.81);Pain Pain - Right/Left: Right Pain - part of body: Hip;Knee;Leg                Time: [id number] OT Time Calculation (min): 42 min Charges:  OT General Charges $OT Visit: 1 Visit OT Evaluation $OT Eval Moderate Complexity: Harper, MSOT, OTR/L [facility] Office Number: [phone] Pager: [phone]  Zenovia Jarred 03/12/2020, 1:52 PM

## 2020-03-12 NOTE — TOC Initial Note (Signed)
Transition of Care [facility]) - Initial/Assessment Note    Patient Details  Name: Gina Reilly MRN: [id number] Date of Birth: 18-Aug-1924  Transition [facility]) CM/SW Contact:    Carles Collet, RN Phone Number: 03/12/2020, 4:29 PM  Clinical Narrative:               Patient from home, lives alone. Information provided by daughters at bedside. Was set up from PCP office for [facility] services, and patient declined further services after initial eval. Patient has aid 4 days a week for three hours a day. Other supported care devided amongst family members. Patient has 2 hospital beds at home and is not using either. Discussed getting trapeze for bed from [employer]. Patient has home oxygen. Daughters are interested in patient returning home at DC, and establishing [facility] services. TOC will continue to follow.      Expected Discharge Plan: [place]     Patient Goals and CMS Choice        Expected Discharge Plan and Services Expected Discharge Plan: [place]       Living arrangements for the past 2 months: Single Family Home                                      Prior Living Arrangements/Services Living arrangements for the past 2 months: Single Family Home Lives with:: Self                   Activities of Daily Living Home Assistive Devices/Equipment: Shower chair with back, Grab bars in shower, Eyeglasses, Environmental consultant (specify type), Cane (specify quad or straight), Wheelchair ADL Screening (condition at time of admission) Patient's cognitive ability adequate to safely complete daily activities?: Yes Is the patient deaf or have difficulty hearing?: Yes Does the patient have difficulty seeing, even when wearing glasses/contacts?: No Does the patient have difficulty concentrating, remembering, or making decisions?: No Patient able to express need for assistance with ADLs?: Yes Does the patient have difficulty dressing or bathing?:  Yes Independently performs ADLs?: Yes (appropriate for developmental age) Does the patient have difficulty walking or climbing stairs?: Yes Weakness of Legs: Both Weakness of Arms/Hands: Both  Permission Sought/Granted                  Emotional Assessment              Admission diagnosis:  Weakness [R53.1] Generalized weakness [R53.1] Patient Active Problem List   Diagnosis Date Noted  . Generalized weakness 03/11/2020  . Candidiasis of genitalia in female 03/11/2020  . Elevated troponin 03/11/2020  . Hypomagnesemia 03/11/2020  . RLS (restless legs syndrome) 10/28/2019  . Pain of right hip joint 11/12/2017  . Unsteady gait 11/12/2017  . Fingernail abnormalities 11/11/2017  . Decreased visual acuity 01/25/2016  . Medicare annual wellness visit, subsequent 11/27/2014  . Constipation 11/22/2014  . Pulmonary edema 06/16/2012  . Intermittent claudication ([place]) 05/22/2012  . Cough 05/18/2012  . Chronic diastolic heart failure ([place]) 05/12/2012  . OSA (obstructive sleep apnea) 04/24/2012  . LVH (left ventricular hypertrophy) 04/24/2012  . Hypertension   . Precancerous skin lesion   . Pelvic mass in female   . Debility 01/10/2011  . ATRIAL FIBRILLATION 08/14/2010  . URETHRAL CARUNCLE 04/05/2010  . Osteoarthritis 03/23/2010  . Hyperglycemia 01/10/2010  . GERD 01/10/2010  . Hyperthyroidism 02/09/2009  . Vitamin D deficiency  02/09/2009  . Hyperlipidemia 02/09/2009  . Obesity 02/09/2009  . Anemia, unspecified 02/09/2009  . DERMATITIS 02/09/2009  . Arthritis 02/09/2009  . URINARY FREQUENCY 02/09/2009  . UNS ADVRS EFF OTH RX MEDICINAL&BIOLOGICAL SBSTNC 02/09/2009   PCP:  Mosie Lukes, MD Pharmacy:   [place], [place] [address] [place] [address] Phone: [phone] Fax: [place], [place] [address] Phone: [phone] Fax: [phone]     Social Determinants of Health (SDOH) Interventions    Readmission Risk Interventions No flowsheet data found.

## 2020-03-12 NOTE — Evaluation (Signed)
Physical Therapy Evaluation Patient Details Name: Gina Reilly MRN: [id number] DOB: February 21, 1924 Today's Date: 03/12/2020   History of Present Illness  Gina Reilly is a 86 y.o. female with medical history significant of chronic atrial fibrillation on Eliquis, hypertension, hyperlipidemia, hypothyroidism, and hyperglycemia who present with complaints of worsening weakness.    Clinical Impression  Pt presents with profound deficits of mobility due to pain, weakness, limited ROM, resulting in inability to reposition self in bed and unable to tolerate sitting upright.  Pt will benefit from PT to address deficits with goal of restoring some level of independence in this 86 year old patient.  Awaiting palliative care c/s which should assist pt and very involved daughter in establishing realistic goals of care.  Recommend PT as part of postacute care plan, and should pt/daughter choose to return home, they will need full support as pt currently needs maximal to total assist for these basic tasks.  If necessary assist at home is not sufficient to protect caregivers and meet pt needs for safety and quality of life, recommend pursue placement options in community.  PT will follow in acute setting.      Follow Up Recommendations Other (comment)(TBD, if home then [facility] services)    [place] [facility] bed;Wheelchair (measurements PT);Wheelchair cushion (measurements PT)    Recommendations for Other Services       Precautions / Restrictions Precautions Precautions: Fall Precaution Comments: painful RLE Restrictions Weight Bearing Restrictions: No      Mobility  Bed Mobility Overal bed mobility: Needs Assistance Bed Mobility: Rolling;Supine to Sit;Sit to Supine Rolling: Max assist;+2 for physical assistance   Supine to sit: Max assist;+2 for physical assistance;+2 for safety/equipment;HOB elevated Sit to supine: Total assist;+2 for physical assistance;+2 for safety/equipment    General bed mobility comments: as dtr states goal is to return home with palliative care, attempted to educate pt on use of bed controls to maximize independence; with HOB elevated, and +2 and max verbal and tactile cues and increased time, able to assist to EOB.  Pt in increased pain and unable to bear wt on R hip due to pain, tolerated long enough to change urine-soaked sheets and then assisted back to supine; rolling L/R with assist, rails, and bed pad and CNA arrived to assist with remainder of care needs and repositioning. Pt tearful and anxious during, became relaxed and comfortable once repositioned well.   Transfers                 General transfer comment: unable  Ambulation/Gait             General Gait Details: unable  Stairs            Wheelchair Mobility    Modified Rankin (Stroke Patients Only)       Balance Overall balance assessment: History of Falls                                           Pertinent Vitals/Pain Pain Assessment: Faces Faces Pain Scale: Hurts worst Pain Location: RLE, esp hip>knee Pain Descriptors / Indicators: Crying;Grimacing;Guarding;Moaning Pain Intervention(s): Limited activity within patient's tolerance;Monitored during session;Repositioned;Utilized relaxation techniques    Home Living Family/patient expects to be discharged to:: Unsure Living Arrangements: Alone Available Help at Discharge: Family;Available 24 hours/day Type of Home: House           Additional Comments:  Daughter is SLP, goal is to return home after palliative care c/s, see other notes. Full set up not recieved due to nature of session. Facility may need to be considered    Prior Function Level of Independence: Independent         Comments: recent progressive weakness due to arthritic pain in RLE     Hand Dominance        Extremity/Trunk Assessment   Upper Extremity Assessment Upper Extremity Assessment: Generalized  weakness    Lower Extremity Assessment Lower Extremity Assessment: Defer to PT evaluation RLE Deficits / Details: OA-related pain in hip and knee primarily resulting in inability to move against gravity and minimal ability to slide abd/add; does flex/extend ankle RLE: Unable to fully assess due to pain       Communication   Communication: No difficulties  Cognition Arousal/Alertness: Awake/alert Behavior During Therapy: Anxious Overall Cognitive Status: Within Functional Limits for tasks assessed                                 General Comments: Pt anxious and uncomfortable on arrival, attempting to scoot up in bed unsuccessfully; anxious and fearful about pain with movement, daughter actively encouraging and motivating patient; once repositioned in supine in dry sheets pt became relaxed and less anxious      General Comments General comments (skin integrity, edema, etc.): Bed soaked in urine, sheets and gown changed and CNA repositioned purewick to manage. Discussion with daughter about goals of care, she would like to take pt home, and is also concerned about pt truly understanding extent of deficits, asks to keep focus on simple goals to motivate patient.  Unclear if, after seeing how difficult pt mobility was, if daughter is also aware of the caregiver burden. See clinical impression statement re: recommendations for postacute rehab needs;    Exercises Other Exercises Other Exercises: instructed pt to engage in DF and quad sets frequently   Assessment/Plan    PT Assessment Patient needs continued PT services  PT Problem List Pain;Decreased mobility;Decreased activity tolerance;Decreased range of motion;Decreased strength       PT Treatment Interventions Functional mobility training;Therapeutic activities;Therapeutic exercise;Patient/family education;Other (comment)(pain management?)    PT Goals (Current goals can be found in the Care Plan section)  Acute Rehab  PT Goals Patient Stated Goal: no pain, to be able to use the bathroom PT Goal Formulation: With patient/family Time For Goal Achievement: 03/26/20 Potential to Achieve Goals: Fair    Frequency Min 3X/week   Barriers to discharge   will need max support at d/c    Co-evaluation PT/OT/SLP Co-Evaluation/Treatment: Yes Reason for Co-Treatment: Necessary to address cognition/behavior during functional activity;For patient/therapist safety;To address functional/ADL transfers PT goals addressed during session: Mobility/safety with mobility;Balance OT goals addressed during session: ADL's and self-care;Proper use of Adaptive equipment and DME;Strengthening/ROM       AM-PAC PT "6 Clicks" Mobility  Outcome Measure Help needed turning from your back to your side while in a flat bed without using bedrails?: Total Help needed moving from lying on your back to sitting on the side of a flat bed without using bedrails?: Total Help needed moving to and from a bed to a chair (including a wheelchair)?: Total Help needed standing up from a chair using your arms (e.g., wheelchair or bedside chair)?: Total Help needed to walk in hospital room?: Total Help needed climbing 3-5 steps with a railing? : Total 6 Click  Score: 6    End of Session   Activity Tolerance: Patient limited by pain Patient left: in bed;with call bell/phone within reach;with family/visitor present;with nursing/sitter in room Nurse Communication: Mobility status;Precautions PT Visit Diagnosis: Muscle weakness (generalized) (M62.81);Pain;Difficulty in walking, not elsewhere classified (R26.2) Pain - Right/Left: Right Pain - part of body: Leg    Time: [id number] PT Time Calculation (min) (ACUTE ONLY): 45 min   Charges:   PT Evaluation $PT Eval Moderate Complexity: 1 Mod PT Treatments $Therapeutic Activity: 8-22 mins        Kearney Hard, PT, DPT, MS Board Certified Geriatric Clinical Specialist   Herbie Drape 03/12/2020, 1:48 PM

## 2020-03-12 NOTE — Progress Notes (Signed)
PROGRESS NOTE  Gina Reilly [id number] DOB: November 18, 1923   PCP: Mosie Lukes, MD  Patient is from: Home.  Lives alone.  Uses walker and cane at baseline. Has aid that comes to her house 4 days a week.  DOA: 03/11/2020 LOS: 0  Brief Narrative / Interim history: 86 year old female with chronic A. fib on Eliquis, HTN, HLD, OSA, hypothyroidism, osteoarthritis and restless leg syndrome presenting with progressive generalized weakness, right hip and knee pain and dysphagia.  In ED, noted to be in A. fib with RVR with HR in 150s.  BP elevated.  CXR with mild cardiomegaly and mild edema.  Received IV Lasix 40 mg x 1 and admitted for generalized weakness, afib with RVR and pain  Subjective: Seen and examined earlier this morning. No major events overnight or this morning. She continues to feel week. Reports pain her right hip. She denies chest pain, SOB, GI or UTI symptoms. She is not interested in surgical evaluation or aggressive measures. She has been managing her pain with Tylenol around the clock and occasional aleve at home. Patient and family interested in palliative care input.   Objective: Vitals:   03/12/20 0429 03/12/20 0752 03/12/20 1300 03/12/20 1342  BP: (!) 151/88 (!) 148/72  (!) 140/99  Pulse: 71 85 67 67  Resp: 18 18 18 18   Temp: (!) 97.3 F (36.3 C) 98.5 F (36.9 C)  (!) 97.5 F (36.4 C)  TempSrc: Oral Oral Oral Oral  SpO2: 99% 99%  94%  Weight: 112.6 kg     Height:        Intake/Output Summary (Last 24 hours) at 03/12/2020 1755 Last data filed at 03/12/2020 1300 Gross per 24 hour  Intake 600 ml  Output 1400 ml  Net -800 ml   Filed Weights   03/11/20 1138 03/12/20 0429  Weight: 104.3 kg 112.6 kg    Examination:  GENERAL: No apparent distress.  Nontoxic. HEENT: MMM.  Vision and hearing grossly intact.  NECK: Supple.  No apparent JVD.  RESP: on RA.  No IWOB.  Fair aeration bilaterally. CVS:  RRR. Heart sounds normal.  ABD/GI/GU: BS+. Abd soft, NTND.    MSK/EXT:  Moves extremities. Moves BLE but limited by pain in right SKIN: no apparent skin lesion or wound NEURO: Awake, alert and oriented appropriately.  No apparent focal neuro deficit. PSYCH: Calm. Normal affect.   Procedures:  None  Microbiology summarized: None  Assessment & Plan: Generalized weakness- chronic issue progressing.   Her A. fib could play some role.  She is also on high-dose Cardizem which could contribute.  CBC and TSH within normal.  Electrolytes benign.  Patient lives alone.  Uses walker and cane at baseline. -PT/OT -Palliative care consulted  Persistent A. Fib with RVR: RVR resolved.  On Cardizem CD and Eliquis at home. -Continue home meds -Follow echocardiogram  Abnormal chest x-ray-some concern about pulmonary congestion/edema.  No respiratory issue.  On room air.  BNP within normal.  Received IV Lasix x1 -Follow echocardiogram  Uncontrolled hypertension: BP elevated but improved.  On Cardizem and losartan at home. -Continue home meds -As needed hydralazine  Hyperkalemia: Now with hypokalemia likely from [facility].  -Replenish and recheck  Chronic pain/osteoarthritis: Chronic issue.  Has been managing with scheduled Tylenol at home.  Right hip x-ray with osteoarthritis but no acute finding.  She is not interested in surgical evaluation which is reasonable given her age.  -Continue Tylenol 1 g 4 times daily -Check LFT -Palliative care consulted  Elevated troponin: Likely demand ischemia from A. Fib/RVR.  No chest pain.  No acute ischemic finding on EKG. -No further work-up  Restless leg syndrome -Continue home Requip  Cutaneous candidiasis of the pannus and groin area -Continue nystatin  Class I obesity: Body mass index is 37.74 kg/m.   Goal of care/DNR/DNI-appropriate.  Patient and family interested in palliative care input as well. -Palliative care consulted          DVT prophylaxis: On Eliquis for A. fib Code Status: DNR/DNI Family  Communication: Updated patient's daughter at bedside. Status is: Observation  Patient will be moved to inpatient status Discharge barrier: Electrolyte derangement, safe disposition, generalized weakness with very limited mobility due to pain and high risk of fall. Therapy recommended DME including hospital bed that need to be delivered prior to discharge.  Echocardiogram pending  Dispo: The patient is from: Home              Anticipated d/c is to: To be determined              Anticipated d/c date is: 1 day              Patient currently is medically stable to d/c.       Consultants:  PMT   Sch Meds:  Scheduled Meds: . acetaminophen  1,000 mg Oral Q6H  . apixaban  5 mg Oral BID  . diclofenac Sodium  2 g Topical BID  . diltiazem  360 mg Oral Daily  . losartan  25 mg Oral QHS  . nystatin  1 application Topical BID  . sodium chloride flush  3 mL Intravenous Once   Continuous Infusions: PRN Meds:.albuterol, hydrALAZINE, ondansetron **OR** ondansetron (ZOFRAN) IV, rOPINIRole  Antimicrobials: Anti-infectives (From admission, onward)   None       I have personally reviewed the following labs and images: CBC: Recent Labs  Lab 03/11/20 1023 03/12/20 0332  WBC 8.0 7.5  HGB 13.8 12.3  HCT 43.5 38.7  MCV 106.1* 103.5*  PLT 184 180   BMP &GFR Recent Labs  Lab 03/11/20 1023 03/11/20 1229 03/11/20 1458 03/12/20 0332  NA 136  --   --  136  K 6.3* 4.0  --  3.3*  CL 102  --   --  103  CO2 25  --   --  24  GLUCOSE 135*  --   --  125*  BUN 17  --   --  11  CREATININE 0.93  --   --  0.84  CALCIUM 9.6  --   --  8.9  MG  --   --  1.6* 1.9   Estimated Creatinine Clearance: 53.9 mL/min (by C-G formula based on SCr of 0.84 mg/dL). Liver & Pancreas: Recent Labs  Lab 03/12/20 0332  AST 20  ALT 18  ALKPHOS 46  BILITOT 1.1  PROT 5.8*  ALBUMIN 3.3*   No results for input(s): LIPASE, AMYLASE in the last 168 hours. No results for input(s): AMMONIA in the last 168  hours. Diabetic: No results for input(s): HGBA1C in the last 72 hours. Recent Labs  Lab 03/11/20 1117  GLUCAP 130*   Cardiac Enzymes: No results for input(s): CKTOTAL, CKMB, CKMBINDEX, TROPONINI in the last 168 hours. No results for input(s): PROBNP in the last 8760 hours. Coagulation Profile: No results for input(s): INR, PROTIME in the last 168 hours. Thyroid Function Tests: Recent Labs    03/11/20 1715  TSH 1.991   Lipid Profile: No results  for input(s): CHOL, HDL, LDLCALC, TRIG, CHOLHDL, LDLDIRECT in the last 72 hours. Anemia Panel: No results for input(s): VITAMINB12, FOLATE, FERRITIN, TIBC, IRON, RETICCTPCT in the last 72 hours. Urine analysis:    Component Value Date/Time   COLORURINE YELLOW 03/11/2020 1229   APPEARANCEUR HAZY (A) 03/11/2020 1229   LABSPEC 1.012 03/11/2020 1229   PHURINE 5.0 03/11/2020 1229   GLUCOSEU NEGATIVE 03/11/2020 1229   GLUCOSEU NEGATIVE 10/14/2016 1355   HGBUR NEGATIVE 03/11/2020 1229   HGBUR negative 04/05/2010 [place] 03/11/2020 1229   BILIRUBINUR neg 12/25/2012 1432   KETONESUR NEGATIVE 03/11/2020 1229   PROTEINUR 100 (A) 03/11/2020 1229   UROBILINOGEN 0.2 10/14/2016 1355   NITRITE NEGATIVE 03/11/2020 1229   LEUKOCYTESUR NEGATIVE 03/11/2020 1229   Sepsis Labs: Invalid input(s): PROCALCITONIN, [place]  Microbiology: No results found for this or any previous visit (from the past 240 hour(s)).  Radiology Studies: DG Pelvis 1-2 Views  Result Date: 03/11/2020 CLINICAL DATA:  Right hip pain EXAM: PELVIS - 1-VIEW COMPARISON:  06/08/2018 FINDINGS: Pelvic ring is intact. Left hip prosthesis is again noted. Progressive degenerative changes of the right hip are noted with increased sclerosis and subchondral cyst formation. No acute fracture is seen. No soft tissue abnormality is noted. IMPRESSION: Progressive degenerative changes on the right. Electronically Signed   By: Inez Catalina M.D.   On: 03/11/2020 20:02     45 minutes with more than 50% spent in reviewing records, counseling patient/family and coordinating care.  Agapito Hanway T. [place]  If 7PM-7AM, please contact night-coverage [email or web address] Password [facility] 03/12/2020, 5:55 PM

## 2020-03-13 ENCOUNTER — Inpatient Hospital Stay (HOSPITAL_COMMUNITY): Payer: Medicare Other

## 2020-03-13 DIAGNOSIS — Z515 Encounter for palliative care: Secondary | ICD-10-CM

## 2020-03-13 DIAGNOSIS — Z7189 Other specified counseling: Secondary | ICD-10-CM

## 2020-03-13 DIAGNOSIS — G2581 Restless legs syndrome: Secondary | ICD-10-CM

## 2020-03-13 DIAGNOSIS — I4891 Unspecified atrial fibrillation: Secondary | ICD-10-CM

## 2020-03-13 LAB — RENAL FUNCTION PANEL
Albumin: 3.4 g/dL — ABNORMAL LOW (ref 3.5–5.0)
Anion gap: 7 (ref 5–15)
BUN: 16 mg/dL (ref 8–23)
CO2: 24 mmol/L (ref 22–32)
Calcium: 9.1 mg/dL (ref 8.9–10.3)
Chloride: 102 mmol/L (ref 98–111)
Creatinine, Ser: 0.85 mg/dL (ref 0.44–1.00)
GFR calc Af Amer: >60 mL/min (ref >60)
GFR calc non Af Amer: 59 mL/min — ABNORMAL LOW (ref >60)
Glucose, Bld: 164 mg/dL — ABNORMAL HIGH (ref 70–99)
Phosphorus: 4.1 mg/dL (ref 2.5–4.6)
Potassium: 4.6 mmol/L (ref 3.5–5.1)
Sodium: 133 mmol/L — ABNORMAL LOW (ref 135–145)

## 2020-03-13 LAB — MAGNESIUM: Magnesium: 1.9 mg/dL (ref 1.7–2.4)

## 2020-03-13 LAB — ECHOCARDIOGRAM LIMITED
Height: 68 in
Weight: 4007.08 oz

## 2020-03-13 LAB — HEMOGLOBIN A1C
Hgb A1c MFr Bld: 6.2 % — ABNORMAL HIGH (ref 4.8–5.6)
Mean Plasma Glucose: 131 mg/dL

## 2020-03-13 MED ORDER — ROPINIROLE HCL 0.5 MG PO TABS: 0.5000 mg | ORAL_TABLET | Freq: Every day | ORAL | Status: DC

## 2020-03-13 MED ORDER — SENNA 8.6 MG PO TABS
2.0000 | ORAL_TABLET | Freq: Every day | ORAL | Status: DC
  Administered 2020-03-13 – 2020-03-15 (×3): 17.2 mg via ORAL
  Filled 2020-03-13 (×3): qty 2

## 2020-03-13 MED ORDER — OXYCODONE HCL 5 MG PO TABS
5.0000 mg | ORAL_TABLET | ORAL | Status: DC | PRN
  Administered 2020-03-13 (×2): 5 mg via ORAL
  Filled 2020-03-13 (×2): qty 1

## 2020-03-13 MED ORDER — ROPINIROLE HCL 0.5 MG PO TABS
0.5000 mg | ORAL_TABLET | Freq: Every day | ORAL | Status: DC
  Administered 2020-03-13: 0.5 mg via ORAL
  Filled 2020-03-13: qty 1

## 2020-03-13 MED ORDER — POLYETHYLENE GLYCOL 3350 17 G PO PACK: 17.0000 g | PACK | Freq: Every day | ORAL | Status: DC | PRN

## 2020-03-13 MED ORDER — PERFLUTREN LIPID MICROSPHERE
1.0000 mL | INTRAVENOUS | Status: AC | PRN
  Administered 2020-03-13: 2 mL via INTRAVENOUS
  Filled 2020-03-13: qty 10

## 2020-03-13 NOTE — Progress Notes (Signed)
New Palliative referral:   Hydrologist [facility])  Hospital Liaison: RN note         Notified by [facility] manager of patient/family request for [facility] Palliative services at home after discharge.         [place] Palliative team will follow up with patient after discharge.         Please call with any hospice or palliative related questions.         Thank you for this referral.         Farrel Gordon, RN, CCM  [place] (listed on [place] under Hospice/[facility])    [phone]

## 2020-03-13 NOTE — Progress Notes (Signed)
   03/13/20 1913  Vitals  BP (!) 143/93  MAP (mmHg) 109  BP Location Right Wrist  BP Method Automatic  Patient Position (if appropriate) Sitting  Pulse Rate (!) 106  Pulse Rate Source Monitor  Resp (!) 22  Oxygen Therapy  SpO2 98 %  O2 Device Nasal Cannula  O2 Flow Rate (L/min) 2 L/min  MEWS Score  MEWS Temp 0  MEWS Systolic 0  MEWS Pulse 1  MEWS RR 1  MEWS LOC 0  MEWS Score 2  MEWS Score Color Yellow  Patient called to front desk complaining od SOB. Expiatory wheezes heard on auscultation PRN albuterol given. Placed back on 2L nasal canula. Will continue to monitor patient.

## 2020-03-13 NOTE — TOC Progression Note (Signed)
Transition of Care (TOC) - Progression Note  Marvetta Gibbons RN, BSN Transitions of Care Unit 4E- RN Case Manager [phone] Cross Coverage for [place]   Patient Details  Name: Gina Reilly MRN: [id number] Date of Birth: [birth year]  [facility]) CM/SW Contact  Dahlia Client, Romeo Rabon, RN Phone Number: 03/13/2020, 3:19 PM  Clinical Narrative:    Noted orders for [facility], DME and referral for PC needs, spoke with pt and daughters at the bedside for transition of care needs. Discussed DME needs- pt already has DME in the home, including hospital bed that they got from [facility] however they are not pleased with bed- and pt prefers to stay in her old bed for now. Both daughters confirm that they also have DME orders from PCP if needed. At this point in time they are declining DME needs and do not want to move forward with DME for transition home. Also discussed HH needs and PC referral.- list provided for [facility] choice Per CMS guidelines from medicare.gov website with star ratings (copy placed in shadow chart)- daughters to review, they are also looking into 24/7 private duty- requesting info on local agencies- provided them with info on some local options along with web search info. Pt has a longterm care insurance policy that is already activated that she has been using PTA.  Per daughter's choice they would like to use [facility] for PC needs- referral called to Audrea Muscat with [facility]- they will reach out to pt/family for PC f/u.  Also discussed possible STSNF stay prior to going home- daughters would like to look at options for this- preference would be [facility] or [facility]- explained to them that it would be unlikely to get a bed offer to either one of these facilities and they are open to an open bed search in [place] to assist in sending out FL2.  If pt return's home will need to f/u on [facility] choice- pt will need EMS transport.    Expected Discharge Plan: [place] Barriers to Discharge: Continued Medical Work up  Expected Discharge Plan and Services Expected Discharge Plan: [place]   Discharge Planning Services: CM Consult Post Acute Care Choice: Durable Medical Equipment, Home Health Living arrangements for the past 2 months: Single Family Home                 DME Arranged: Bedside commode, Wheelchair manual, Hospital bed DME Agency: (declined DME at this time)       HH Arranged: PT, OT, RN, Nurse's Aide           Social Determinants of Health (SDOH) Interventions    Readmission Risk Interventions No flowsheet data found.

## 2020-03-13 NOTE — Progress Notes (Signed)
  Echocardiogram 2D Echocardiogram has been performed.  [place] Muscato A Shannie Kontos 03/13/2020, 2:08 PM

## 2020-03-13 NOTE — Progress Notes (Signed)
PROGRESS NOTE  Gina Reilly HTD:[id number] DOB: Jan 17, 1924   PCP: Mosie Lukes, MD  Patient is from: Home.  Lives alone.  Uses walker and cane at baseline. Has aid that comes to her house 4 days a week.  DOA: 03/11/2020 LOS: 1  Brief Narrative / Interim history: 86 year old female with chronic A. fib on Eliquis, HTN, HLD, OSA, hypothyroidism, osteoarthritis and restless leg syndrome presenting with progressive generalized weakness, right hip and knee pain and dysphagia.  In ED, noted to be in A. fib with RVR with HR in 150s.  BP elevated.  CXR with mild cardiomegaly and mild edema.  Received IV Lasix 40 mg x 1 and admitted for generalized weakness, afib with RVR and pain.   Palliative care consulted and met with patient and family.  Started on oxycodone 5 mg every 3 hours as needed for severe pain.  Evaluated by therapy who recommended home health, hospital bed, wheelchair and 3 in 1 commode.   Subjective: Seen and examined earlier this morning.  No major events overnight of this morning.  Continues to endorse intermittent pain especially with restless leg syndrome.  No other complaints.  Denies chest pain, dyspnea, dizziness, GI or UTI symptoms.   Objective: Vitals:   03/12/20 1342 03/12/20 2058 03/13/20 0500 03/13/20 0510  BP: (!) 140/99 (!) 126/97  137/90  Pulse: 67 97  (!) 102  Resp: _0 Temp: (!) 97.5 F (36.4 C) 97.6 F (36.4 C)  (!) 97.5 F (36.4 C)  TempSrc: Oral Oral  Oral  SpO2: 94% 97%  95%  Weight:   113.6 kg   Height:        Intake/Output Summary (Last 24 hours) at 03/13/2020 1255 Last data filed at 03/13/2020 0512 Gross per 24 hour  Intake 360 ml  Output 1050 ml  Net -690 ml   Filed Weights   03/11/20 1138 03/12/20 0429 03/13/20 0500  Weight: 104.3 kg 112.6 kg 113.6 kg    Examination:  GENERAL: No apparent distress.  Nontoxic. HEENT: MMM.  Vision and hearing grossly intact.  NECK: Supple.  No apparent JVD.  RESP: on RA.  No IWOB.  Fair  aeration bilaterally. CVS:  RRR. Heart sounds normal.  ABD/GI/GU: BS+. Abd soft, NTND.  MSK/EXT:  Moves extremities. Moves BLE but limited by pain in right SKIN: no apparent skin lesion or wound NEURO: Awake, alert and oriented appropriately.  No apparent focal neuro deficit. PSYCH: Calm. Normal affect.   GENERAL: No apparent distress.  Nontoxic. HEENT: MMM.  Vision and hearing grossly intact.  NECK: Supple.  No apparent JVD.  RESP: 95% on 2 L.  No IWOB.  Fair aeration bilaterally. CVS: Irregular rhythm.  Slightly tachycardic. Heart sounds normal.  ABD/GI/GU: BS+. Abd soft, NTND.  MSK/EXT:  Moves extremities. No apparent deformity.  Trace edema. SKIN: Erythematous skin lesion over inguinal skin folds NEURO: Awake, alert and oriented appropriately.  No apparent focal neuro deficit. PSYCH: Calm. Normal affect.  Procedures:  None  Microbiology summarized: None  Assessment & Plan: Generalized weakness- chronic issue progressing.   Her A. fib could play some role.  She is also on high-dose Cardizem which could contribute.  CBC and TSH within normal.  Electrolytes benign.  Patient lives alone.  Uses walker and cane at baseline. -Appreciate help by palliative care and therapy.   -Therapy recommended SNF but patient and family prefers to go home -[facility] PT/OT/RN/aide/hospital bed/wheelchair/bedside commode ordered.  Persistent A. Fib with RVR: RVR resolved.  On Cardizem CD and Eliquis at home. -Continue home meds -Follow echocardiogram  Abnormal chest x-ray-some concern about pulmonary congestion/edema.  No respiratory issue.  On room air.  BNP within normal.  Received IV Lasix x1 -Follow echocardiogram  Uncontrolled hypertension: Normotensive this morning.  On Cardizem and losartan at home. -Continue home meds -As needed hydralazine  Hyperkalemia/hypokalemia/hypomagnesemia: Resolved. -Replenish and recheck  Acute on chronic pain/osteoarthritis: Chronic issue.  Has been managing with  scheduled Tylenol at home.  Right hip x-ray with osteoarthritis but no acute finding.  She is not interested in surgical evaluation which is reasonable given her age.  -Continue Tylenol 1 g 4 times daily-LFT within normal -Appreciate help by palliative care-started oxycodone 5 mg every 3 hours as needed  Elevated troponin: Likely demand ischemia from A. Fib/RVR.  No chest pain.  No acute ischemic finding on EKG. -Follow echocardiogram.  Restless leg syndrome -Continue home Requip  Cutaneous candidiasis of the pannus and groin area -Continue nystatin  Class I obesity: Body mass index is 38.08 kg/m.   Goal of care/DNR/DNI-appropriate.  Patient and family interested in palliative care input as well. -Appreciate input by palliative care -Outpatient referral to [facility] ordered by palliative care.          DVT prophylaxis: On Eliquis for A. fib Code Status: DNR/DNI Family Communication: Updated patient's daughters at bedside. Status is: Observation  Patient will be moved to inpatient status Discharge barrier: safe disposition, generalized weakness with very limited mobility due to pain and high risk of fall.  Echocardiogram pending.  Appropriate DME needs to be delivered prior to discharge.  Palliative care fine-tuning pain regimen and dose.  Dispo: The patient is from: Home              Anticipated d/c is to: Home              Anticipated d/c date is: 1 day              Patient currently is not medically stable to d/c.       Consultants:  PMT   Sch Meds:  Scheduled Meds: . acetaminophen  1,000 mg Oral Q6H  . apixaban  5 mg Oral BID  . diclofenac Sodium  2 g Topical BID  . diltiazem  360 mg Oral Daily  . losartan  25 mg Oral QHS  . nystatin  1 application Topical BID  . [START ON 03/14/2020] rOPINIRole  0.5 mg Oral QHS  . senna  2 tablet Oral QHS  . sodium chloride flush  3 mL Intravenous Once   Continuous Infusions: PRN Meds:.albuterol, hydrALAZINE,  ondansetron **OR** ondansetron (ZOFRAN) IV, oxyCODONE  Antimicrobials: Anti-infectives (From admission, onward)   None       I have personally reviewed the following labs and images: CBC: Recent Labs  Lab 03/11/20 1023 03/12/20 0332  WBC 8.0 7.5  HGB 13.8 12.3  HCT 43.5 38.7  MCV 106.1* 103.5*  PLT 184 180   BMP &GFR Recent Labs  Lab 03/11/20 1023 03/11/20 1229 03/11/20 1458 03/12/20 0332 03/13/20 0506  NA 136  --   --  136 133*  K 6.3* 4.0  --  3.3* 4.6  CL 102  --   --  103 102  CO2 25  --   --  24 24  GLUCOSE 135*  --   --  125* 164*  BUN 17  --   --  11 16  CREATININE 0.93  --   --  0.84  0.85  CALCIUM 9.6  --   --  8.9 9.1  MG  --   --  1.6* 1.9 1.9  PHOS  --   --   --   --  4.1   Estimated Creatinine Clearance: 53.5 mL/min (by C-G formula based on SCr of 0.85 mg/dL). Liver & Pancreas: Recent Labs  Lab 03/12/20 0332 03/13/20 0506  AST 20  --   ALT 18  --   ALKPHOS 46  --   BILITOT 1.1  --   PROT 5.8*  --   ALBUMIN 3.3* 3.4*   No results for input(s): LIPASE, AMYLASE in the last 168 hours. No results for input(s): AMMONIA in the last 168 hours. Diabetic: No results for input(s): HGBA1C in the last 72 hours. Recent Labs  Lab 03/11/20 1117  GLUCAP 130*   Cardiac Enzymes: No results for input(s): CKTOTAL, CKMB, CKMBINDEX, TROPONINI in the last 168 hours. No results for input(s): PROBNP in the last 8760 hours. Coagulation Profile: No results for input(s): INR, PROTIME in the last 168 hours. Thyroid Function Tests: Recent Labs    03/11/20 1715  TSH 1.991   Lipid Profile: No results for input(s): CHOL, HDL, LDLCALC, TRIG, CHOLHDL, LDLDIRECT in the last 72 hours. Anemia Panel: No results for input(s): VITAMINB12, FOLATE, FERRITIN, TIBC, IRON, RETICCTPCT in the last 72 hours. Urine analysis:    Component Value Date/Time   COLORURINE YELLOW 03/11/2020 1229   APPEARANCEUR HAZY (A) 03/11/2020 1229   LABSPEC 1.012 03/11/2020 1229   PHURINE 5.0  03/11/2020 1229   GLUCOSEU NEGATIVE 03/11/2020 1229   GLUCOSEU NEGATIVE 10/14/2016 1355   HGBUR NEGATIVE 03/11/2020 1229   HGBUR negative 04/05/2010 [place] 03/11/2020 1229   BILIRUBINUR neg 12/25/2012 1432   KETONESUR NEGATIVE 03/11/2020 1229   PROTEINUR 100 (A) 03/11/2020 1229   UROBILINOGEN 0.2 10/14/2016 1355   NITRITE NEGATIVE 03/11/2020 1229   LEUKOCYTESUR NEGATIVE 03/11/2020 1229   Sepsis Labs: Invalid input(s): PROCALCITONIN, [place]  Microbiology: No results found for this or any previous visit (from the past 240 hour(s)).  Radiology Studies: No results found.  Keli Buehner T. Arnett  If 7PM-7AM, please contact night-coverage [email or web address] Password [facility] 03/13/2020, 12:55 PM

## 2020-03-13 NOTE — Consult Note (Signed)
Consultation Note Date: 03/13/2020   Patient Name: Gina Reilly  DOB: 06-19-1924  MRN: [id number]  Age / Sex: 86 y.o., female  PCP: Mosie Lukes, MD Referring Physician: Mercy Riding, MD  Reason for Consultation: Establishing goals of care  HPI/Patient Profile: 86 y.o. female  with past medical history of a fib on eliquis, HTN, HLD, OSA, hypothyroidism, OA, and RLS admitted on 03/11/2020 with weakness, R hip pain, and bilateral knee pain. Also complains of dysphagia.  Labs okay. Some pulmonary edema on initial chest x-ray - received one dose of lasix, on room air. She has refused surgical evaluation for MSK pain.  PMT consulted for [place] and symptom management.   Clinical Assessment and Goals of Care: I have reviewed medical records including EPIC notes, labs and imaging, received report from RN, assessed the patient and then met with patient and her 2 daughters, Hassan Rowan and Kennyth Lose,  to discuss diagnosis prognosis, GOC, EOL wishes, disposition and options.  I introduced Palliative Medicine as specialized medical care for people living with serious illness. It focuses on providing relief from the symptoms and stress of a serious illness. The goal is to improve quality of life for both the patient and the family.  Patient has 2 daughters at the bedside - has had 9 children total, 17 grandchildren - 6 daughters still living.   They tell me that patient has been living at home alone but has been getting progressively weaker and slower. She had a near fall prior to admission. Her pain in her joints has increased as well.    We discussed patient's current illness and what it means in the larger context of patient's on-going co-morbidities.   I attempted to elicit values and goals of care important to the patient.  After thorough discussion with patient and weigh in from family members - goals can be summarized as prioritizing QUALITY of life and going  home. She is not interested in aggressive measures - not even physical therapy. They want to maintain current status and if she declines further, focus purely on comfort. They are not interested in rehabilitation.   We discuss her symptoms. She first tells me about her dysphagia - we discuss options of SLP evaluation for education on managing dysphagia - she declines. We discuss that this would be minimally invasive and more so for education and information for her about safe swallowing practices - she again declines and daughters agree.   Next we discuss her pain regimen - she explains restless leg syndrome and pain in bilateral knees and R hip - unable to tolerate weight bearing due to pain. She is hopeful for improvement in function with improvement of symptom management. She tells me she has been taking ropinarole as needed - possibly more than prescribed. We discussed concern of overuse of ropinirole - this was making her very sleepy. She agrees to scheduled dose and not using PRN.   She has scheduled tylenol - 4g/day - discussed possible decrease to 3g/day but will try to get pain better first. Discussed starting oxycodone - she is somewhat hesitant to start narcotic. Discussed starting low dose PRN and see how she tolerates over the next 24 hours. She and daughters all agree to this. Discussed side effect of constipation and initiation of scheduled senna at bedtime. Also discussed possibility of some nausea and use of PRN ondansetron. Discussed this side effect will improve with time.  Discussed with patient/family the importance of continued conversation with family and the  medical providers regarding overall plan of care and treatment options, ensuring decisions are within the context of the patient's values and GOCs.    Hospice and Palliative Care services outpatient were explained and offered. Discussed that patient's goals of care are in line with hospice services however I am not sure that she  qualifies for there services yet. Discussed when she may become eligible. Discussed palliative care following outpatient and helping to transition to hospice once more appropriate. Patient and family agreeable to this.  Patient is hopeful to go home with extra support - they share about resources and LTC insurance - will ask [facility] team to discuss discharge plan with family with hopes for enough support at home.   Questions and concerns were addressed. The family was encouraged to call with questions or concerns.   Primary Decision Maker HCPOA - daughters 1. Brenda 2. Jennifer   SUMMARY OF RECOMMENDATIONS   - goals clear for no aggressive interventions, focus on good symptom management and promoting quality of life, patient hopeful to return home - Change ropinirole to one scheduled dose at night instead of PRN use d/t somnolence - Add oxycodone 5 mg PRN q3hr - will assess use and tolerance tomorrow prior to discharge - may change to long-acting depending on amount needed - Add senna 2 tabs at bedtime scheduled, PRN miralax, PRN zofran - Discussed with TOC team need for care at home - they will discuss with family - patient and family interested in hospice but likely not yet eligible - will ask outpatient palliative to follow and help transition to hospice when more appropriate  Code Status/Advance Care Planning:  DNR  Discharge Planning: Home with [place] and pallaitive     Primary Diagnoses: Present on Admission: . Obesity . ATRIAL FIBRILLATION . Osteoarthritis . Candidiasis of genitalia in female . Elevated troponin . Pulmonary edema . Hypomagnesemia . Hypokalemia   I have reviewed the medical record, interviewed the patient and family, and examined the patient. The following aspects are pertinent.  Past Medical History:  Diagnosis Date  . 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 Anemia   . Apnea, sleep 04/24/2012  . Arthritis    osteo  . Bronchitis 12/25/2011  . Chicken pox as a child  . Chronic atrial fibrillation ([place])    a. 07/2010 TEE/DCCV:  EF 60-65% mild MR.; b. Recurrent Afib--> Amiodarone;  Pradaxa (CHA2DS2VASc = 4);  c. Amio d/c'd 2/2 toxicity - now chronic rate-controlled afib.  . Constipation 11/22/2014  . Contusion of leg, left 01/16/2012  . GERD (gastroesophageal reflux disease)   . History of echocardiogram    a. 07/2015 Echo: EF 60-65%, Ao sclerosis, mild MR, mildly dil LA/RV/RA, mild Tr, PASP 33mHg.  .Marland KitchenHyperglycemia 01/10/2010   Qualifier: Diagnosis of  By: SNiel HummerMD, WYubaHyperlipidemia   . Hypertension   . Hyperthyroidism 02/09/2009   Qualifier: Diagnosis of  By: SNiel HummerMD, WPaoniaHypoxia 04/14/2012  . [place]ent claudication [place] 05/22/2012  . Measles as a child  . Medicare annual wellness visit, subsequent 11/27/2014   Follows with pulmonology, Dr CGwenette GreetFollow with cardiology, Dr CRance Muirwith Dr HMatthew SarasDoes not participate in [facility] or colonoscopy screening at this time.  . Mumps as a child  . Obese   . Ovarian cyst   . Pelvic mass  in female   . Peripheral neuropathy 02/26/2012  . Precancerous skin lesion   . Skin cancer    left temple  . Thyroid disease    hypothroidism  . Vitamin D deficiency    Social History   Socioeconomic History  . Marital status: Widowed    Spouse name: Not on file  . Number of children: 31  . Years of education: Not on file  . Highest education level: Not on file  Occupational History  . Occupation: retired    Fish farm manager: RETIRED  Tobacco Use  . Smoking status: Former Smoker    Packs/day: 0.30    Years: 5.00    Pack years: 1.50    Types: Cigarettes    Quit date: 10/28/1973    Years since quitting: 46.4  . Smokeless tobacco: Never Used  Substance and Sexual Activity  . Alcohol use: Yes    Comment: socially  . Drug use: No  . Sexual  activity: Not on file  Other Topics Concern  . Not on file  Social History Narrative  . Not on file   Social Determinants of Health   Financial Resource Strain:   . Difficulty of Paying Living Expenses:   Food Insecurity:   . Worried About Charity fundraiser in the Last Year:   . Arboriculturist in the Last Year:   Transportation Needs:   . Film/video editor (Medical):   Marland Kitchen Lack of Transportation (Non-Medical):   Physical Activity:   . Days of Exercise per Week:   . Minutes of Exercise per Session:   Stress:   . Feeling of Stress :   Social Connections:   . Frequency of Communication with Friends and Family:   . Frequency of Social Gatherings with Friends and Family:   . Attends Religious Services:   . Active Member of Clubs or Organizations:   . Attends Archivist Meetings:   Marland Kitchen Marital Status:    Family History  Problem Relation Age of Onset  . Stroke Mother   . Prostate cancer Father        prostate  . Hypertension Brother   . Cancer Maternal Grandmother   . Hypertension Brother   . Heart disease Brother   . Obesity Brother   . Hypertension Brother   . Hyperlipidemia Brother   . Heart disease Brother   . Kidney disease Brother   . Cancer Daughter        kidney- spine and blood stream  . Heart disease Son        valvular heart disease  . Prostate cancer Brother    Scheduled Meds: . acetaminophen  1,000 mg Oral Q6H  . apixaban  5 mg Oral BID  . diclofenac Sodium  2 g Topical BID  . diltiazem  360 mg Oral Daily  . losartan  25 mg Oral QHS  . nystatin  1 application Topical BID  . [START ON 03/14/2020] rOPINIRole  0.5 mg Oral QHS  . senna  2 tablet Oral QHS  . sodium chloride flush  3 mL Intravenous Once   Continuous Infusions: PRN Meds:.albuterol, hydrALAZINE, ondansetron **OR** ondansetron (ZOFRAN) IV, oxyCODONE, perflutren lipid microspheres (DEFINITY) IV suspension Allergies  Allergen Reactions  . Amiodarone Other (See Comments)     Amiodarone pulmonary toxicity.  . Aspercreme [Trolamine Salicylate] Other (See Comments)    Pt states it caused redness like it was burning her skin.   Review of Systems  Constitutional: Positive for activity change.  Gastrointestinal:       Dysphagia  Musculoskeletal: Positive for arthralgias and gait problem.    Physical Exam Constitutional:      General: She is not in acute distress. Pulmonary:     Effort: Pulmonary effort is normal. No respiratory distress.     Breath sounds: Normal breath sounds.  Skin:    General: Skin is warm and dry.  Neurological:     Mental Status: She is alert and oriented to person, place, and time.  Psychiatric:        Mood and Affect: Mood normal.        Behavior: Behavior normal.        Thought Content: Thought content normal.     Vital Signs: BP 137/90 (BP Location: Left Wrist)   Pulse (!) 102   Temp (!) 97.5 F (36.4 C) (Oral)   Resp 20   Ht 5' 8"  (1.727 m)   Wt 113.6 kg   LMP 12/25/2011   SpO2 95%   BMI 38.08 kg/m  Pain Scale: 0-10   Pain Score: 8    SpO2: SpO2: 95 % O2 Device:SpO2: 95 % O2 Flow Rate: .   IO: Intake/output summary:   Intake/Output Summary (Last 24 hours) at 03/13/2020 1407 Last data filed at 03/13/2020 8301 Gross per 24 hour  Intake 240 ml  Output 1050 ml  Net -810 ml    LBM: Last BM Date: 03/12/20 Baseline Weight: Weight: 104.3 kg Most recent weight: Weight: 113.6 kg     Palliative Assessment/Data: PPS 40%    Time Total: 120 minutes Greater than 50%  of this time was spent counseling and coordinating care related to the above assessment and plan.  Juel Burrow, DNP, AGNP-C Palliative Medicine Team [phone] Pager: [phone]

## 2020-03-14 MED ORDER — OXYCODONE HCL 5 MG PO TABS: 2.5000 mg | ORAL_TABLET | Freq: Three times a day (TID) | ORAL | Status: DC | PRN

## 2020-03-14 MED ORDER — TRAMADOL HCL 50 MG PO TABS
50.0000 mg | ORAL_TABLET | Freq: Two times a day (BID) | ORAL | Status: DC | PRN
  Administered 2020-03-15 – 2020-03-16 (×2): 50 mg via ORAL
  Filled 2020-03-14 (×2): qty 1

## 2020-03-14 MED ORDER — ROPINIROLE HCL ER 4 MG PO TB24
4.0000 mg | ORAL_TABLET | Freq: Every day | ORAL | Status: DC
  Administered 2020-03-14 – 2020-03-15 (×2): 4 mg via ORAL
  Filled 2020-03-14 (×3): qty 1

## 2020-03-14 NOTE — TOC Progression Note (Addendum)
Transition of Care [facility]) - Progression Note    Patient Details  Name: Gina Reilly MRN: [id number] Date of Birth: Oct 05, 1924  Transition of Care [facility]) CM/SW Contact  Gina Mayo, RN Phone Number: 03/14/2020, 1:56 PM  Clinical Narrative:    NCM contacted patient's daughter, Gina Reilly, she states that Gina Reilly is here in the room so NCM went to room and put [place] on speaker, they were discussing the plan, they would like for a SNF bed to  Be started on in case they decided not to do private duty and the preferred ones are [facility] and [facility] and the patient states which ever one we can get will be fine with her.  CSW has started on the beds search.  Gina Reilly states that they are working with Product/process development scientist for private duty but they may not have enough staff.  NCM asked Gina Reilly if they still want the hospital bed, she states yes, and she would be the contact person [phone].  [facility] does not have any hospital beds. Gina Reilly does not have any hospital beds, [facility] just gave away the last hospital bed they had.  [facility] has one but states it will need to be prior authorized which will take 7 days and if they want it before the prior auth then it will cost 205.00 up front. NCM will let Gina Reilly know this information.    16:33 NCM spoke with Gina Reilly and informed her of the above information. She states they will just keep the bed they have and work with it.  She states the only DME they will need is the [facility] and a transport chair.   Patient will need to be transported by ambulance at 11 am on Thursday. They have gotten private duty set up for patient and they will not be there until 4pm on Thursday so they would like to be dc then. NCM made referral to [facility] with [facility] for bsc and transport chair, he states will be quicker if they can pick DME up here in her room and take it home.  NCM informed Gina Reilly of this information and she will plan to pick up bsc and transport chair tomorrow.  A hoyer  lift was ordered for patient also.   Also daughter , Gina Reilly wants patient to have purewick , Dr. Pennie Banter gave verbal order for this for 3 month period.  The company will fax the script over for Dr. Pennie Banter to sign.  Awaiting fax.   Expected Discharge Plan: [place] Barriers to Discharge: Continued Medical Work up  Expected Discharge Plan and Services Expected Discharge Plan: [place]   Discharge Planning Services: CM Consult Post Acute Care Choice: Durable Medical Equipment, Home Health Living arrangements for the past 2 months: Single Family Home                 DME Arranged: Bedside commode, Wheelchair manual, Hospital bed DME Agency: (declined DME at this time)       HH Arranged: PT, OT, RN, Nurse's Aide           Social Determinants of Health (SDOH) Interventions    Readmission Risk Interventions No flowsheet data found.

## 2020-03-14 NOTE — Progress Notes (Signed)
PROGRESS NOTE  Gina Reilly NZV:[id number] DOB: 14-Aug-1924   PCP: Mosie Lukes, MD  Patient is from: Home.  Lives alone.  Uses walker and cane at baseline. Has aid that comes to her house 4 days a week.  DOA: 03/11/2020 LOS: 2  Brief Narrative / Interim history: 86 year old female with chronic A. fib on Eliquis, HTN, HLD, OSA, hypothyroidism, osteoarthritis and restless leg syndrome presenting with progressive generalized weakness, right hip and knee pain and dysphagia.  In ED, noted to be in A. fib with RVR with HR in 150s.  BP elevated.  CXR with mild cardiomegaly and mild edema.  Received IV Lasix 40 mg x 1 and admitted for generalized weakness, afib with RVR and pain.  RVR resolved.  Echocardiogram without significant finding.  Palliative care consulted and met with patient and family.  Started on oxycodone 5 mg every 3 hours as needed for severe pain.  She received 2 doses on 5/17 and had significant nausea and vomiting.  Oxycodone discontinued.  Now she is on Tylenol 1 g 4 times daily, tramadol 50 mg twice daily as needed and oxycodone 2.5 mg every 8 hours as needed.  PMT also increased Requip to 4 mg nightly.  Plan is to titrate pain medications and discharged on appropriate regimen once DME delivered to her house.  Patient and family refused SNF. Home health, hospital bed, wheelchair and 3 in 1 commode ordered as recommended by therapy.    Subjective: Seen and examined earlier this morning.  No major events overnight of this morning.  She had significant nausea and vomiting late yesterday afternoon after 2 doses of oxycodone 5 mg.  She slept well overnight.  No complaint this morning.  Pain fairly controlled.  Denies chest pain, dyspnea, nausea, vomiting or abdominal pain.  Objective: Vitals:   03/13/20 2317 03/14/20 0318 03/14/20 0934 03/14/20 1100  BP: 132/89 (!) 155/98 (!) 145/92   Pulse: 73 80 64   Resp: _0 Temp: 97.7 F (36.5 C) 97.8 F (36.6 C) 97.8 F (36.6 C)    TempSrc: Oral Oral Oral   SpO2: 97% 98% 97%   Weight:  53.3 kg  112 kg  Height:    _1  (1.727 m)    Intake/Output Summary (Last 24 hours) at 03/14/2020 1124 Last data filed at 03/14/2020 0803 Gross per 24 hour  Intake 480 ml  Output 2150 ml  Net -1670 ml   Filed Weights   03/13/20 0500 03/14/20 0318 03/14/20 1100  Weight: 113.6 kg 53.3 kg 112 kg    Examination:  GENERAL: No apparent distress.  Nontoxic. HEENT: MMM.  Vision and hearing grossly intact.  NECK: Supple.  No apparent JVD.  RESP: 98% on 2 L.  No IWOB.  Fair aeration bilaterally. CVS: Irregular rhythm.  Slightly tachycardic. Heart sounds normal.  ABD/GI/GU: BS+. Abd soft, NTND.  MSK/EXT:  Moves extremities.  Moves BLE but limited by pain in right. SKIN: no apparent skin lesion or wound NEURO: Awake, alert and oriented appropriately.  No apparent focal neuro deficit. PSYCH: Calm. Normal affect.  Procedures:  None  Microbiology summarized: None  Assessment & Plan: Generalized weakness- chronic issue progressing.   Her A. fib could play some role.  She is also on high-dose Cardizem which could contribute.  CBC and TSH within normal.  Electrolytes benign.  Patient lives alone.  Uses walker and cane at baseline. -Appreciate help by palliative care and therapy.   -Therapy recommended SNF but patient  and family prefers to go home -[facility] PT/OT/RN/aide/hospital bed/wheelchair/bedside commode ordered.  Acute on chronic pain/osteoarthritis: Chronic issue.  Has been managing with scheduled Tylenol at home.  Right hip x-ray with osteoarthritis but no acute finding.  She is not interested in surgical evaluation which is reasonable given her age.  -Continue Tylenol 1 g 4 times daily-LFT within normal -Tramadol 50 mg q12 hrs PRN moderate pain and oxycodone 2.5 mg q8 hrs PRN severe pain -Adjust pain medications as she tolerates for pain control -Appreciate help and guidance by PMT  Persistent A. Fib with RVR: RVR resolved.  On  Cardizem CD and Eliquis at home.  Echocardiogram without significant finding. -Continue home meds  Abnormal chest x-ray-some concern about pulmonary congestion/edema.  No respiratory issue.  On room air.  BNP within normal.  Received IV Lasix x1, Echocardiogram without significant finding.  Uncontrolled hypertension: Normotensive for most part.  On Cardizem and losartan at home. -Continue home meds -As needed hydralazine  Hyperkalemia/hypokalemia/hypomagnesemia: Resolved. -Replenish and recheck   Elevated troponin: Likely demand ischemia from A. Fib/RVR.  No chest pain.  No acute ischemic finding on EKG. -Follow echocardiogram.  Restless leg syndrome -Requip from 0.5 mg TID>>23m XR HS per PMT  Cutaneous candidiasis of the pannus and groin area -Continue nystatin  Class I obesity: Body mass index is 37.54 kg/m.   Goal of care/DNR/DNI-appropriate.  Patient and family interested in palliative care input as well. -Appreciate input by palliative care -Outpatient referral to [facility] ordered by palliative care.          DVT prophylaxis: On Eliquis for A. fib Code Status: DNR/DNI Family Communication: Updated patient's daughters, BHassan Rowanand JKennyth Loseover the phone Status is: Inpatient  Patient will be moved to inpatient status Discharge barrier: Uncontrolled pain, safe disposition, generalized weakness with very limited mobility due to pain and high risk of fall.  Adjusting pain meds.  Waiting on DME.    Dispo: The patient is from: Home              Anticipated d/c is to: Home              Anticipated d/c date is: 1 day              Patient currently is not medically stable to d/c.       Consultants:  PMT   Sch Meds:  Scheduled Meds: . acetaminophen  1,000 mg Oral Q6H  . apixaban  5 mg Oral BID  . diclofenac Sodium  2 g Topical BID  . diltiazem  360 mg Oral Daily  . losartan  25 mg Oral QHS  . nystatin  1 application Topical BID  . rOPINIRole  4 mg Oral QHS    . senna  2 tablet Oral QHS  . sodium chloride flush  3 mL Intravenous Once   Continuous Infusions: PRN Meds:.albuterol, hydrALAZINE, ondansetron **OR** ondansetron (ZOFRAN) IV, oxyCODONE, polyethylene glycol, traMADol  Antimicrobials: Anti-infectives (From admission, onward)   None       I have personally reviewed the following labs and images: CBC: Recent Labs  Lab 03/11/20 1023 03/12/20 0332  WBC 8.0 7.5  HGB 13.8 12.3  HCT 43.5 38.7  MCV 106.1* 103.5*  PLT 184 180   BMP &GFR Recent Labs  Lab 03/11/20 1023 03/11/20 1229 03/11/20 1458 03/12/20 0332 03/13/20 0506  NA 136  --   --  136 133*  K 6.3* 4.0  --  3.3* 4.6  CL 102  --   --  103 102  CO2 25  --   --  24 24  GLUCOSE 135*  --   --  125* 164*  BUN 17  --   --  11 16  CREATININE 0.93  --   --  0.84 0.85  CALCIUM 9.6  --   --  8.9 9.1  MG  --   --  1.6* 1.9 1.9  PHOS  --   --   --   --  4.1   Estimated Creatinine Clearance: 53.1 mL/min (by C-G formula based on SCr of 0.85 mg/dL). Liver & Pancreas: Recent Labs  Lab 03/12/20 0332 03/13/20 0506  AST 20  --   ALT 18  --   ALKPHOS 46  --   BILITOT 1.1  --   PROT 5.8*  --   ALBUMIN 3.3* 3.4*   No results for input(s): LIPASE, AMYLASE in the last 168 hours. No results for input(s): AMMONIA in the last 168 hours. Diabetic: Recent Labs    03/13/20 0845  HGBA1C 6.2*   Recent Labs  Lab 03/11/20 1117  GLUCAP 130*   Cardiac Enzymes: No results for input(s): CKTOTAL, CKMB, CKMBINDEX, TROPONINI in the last 168 hours. No results for input(s): PROBNP in the last 8760 hours. Coagulation Profile: No results for input(s): INR, PROTIME in the last 168 hours. Thyroid Function Tests: Recent Labs    03/11/20 1715  TSH 1.991   Lipid Profile: No results for input(s): CHOL, HDL, LDLCALC, TRIG, CHOLHDL, LDLDIRECT in the last 72 hours. Anemia Panel: No results for input(s): VITAMINB12, FOLATE, FERRITIN, TIBC, IRON, RETICCTPCT in the last 72 hours. Urine  analysis:    Component Value Date/Time   COLORURINE YELLOW 03/11/2020 1229   APPEARANCEUR HAZY (A) 03/11/2020 1229   LABSPEC 1.012 03/11/2020 1229   PHURINE 5.0 03/11/2020 1229   GLUCOSEU NEGATIVE 03/11/2020 1229   GLUCOSEU NEGATIVE 10/14/2016 1355   HGBUR NEGATIVE 03/11/2020 1229   HGBUR negative 04/05/2010 [place] 03/11/2020 1229   BILIRUBINUR neg 12/25/2012 1432   KETONESUR NEGATIVE 03/11/2020 1229   PROTEINUR 100 (A) 03/11/2020 1229   UROBILINOGEN 0.2 10/14/2016 1355   NITRITE NEGATIVE 03/11/2020 1229   LEUKOCYTESUR NEGATIVE 03/11/2020 1229   Sepsis Labs: Invalid input(s): PROCALCITONIN, [place]  Microbiology: No results found for this or any previous visit (from the past 240 hour(s)).  Radiology Studies: ECHOCARDIOGRAM LIMITED  Result Date: 03/13/2020    ECHOCARDIOGRAM LIMITED REPORT   Patient Name:   Gina Reilly Date of Exam: 03/13/2020 Medical Rec #:  [id number]    Height:       68.0 in Accession #:    [id number]   Weight:       250.4 lb Date of Birth:  18-Apr-1924    BSA:          2.249 m Patient Age:    20 years     BP:           137/90 mmHg Patient Gender: F            HR:           102 bpm. Exam Location:  Inpatient Procedure: Limited Echo, Color Doppler, Cardiac Doppler and Intracardiac            Opacification Agent Indications:    Atrial Fibrillation 427.31 / I48.91  History:        Patient has prior history of Echocardiogram examinations, most  recent 08/22/2015. LVH; Risk Factors:Elevated troponin, Sleep                 Apnea and Hypertension.  Sonographer:    Vikki Ports Turrentine Referring Phys: [id number] Mercy Riding  Sonographer Comments: Technically difficult study due to patient positioning and extreme sensitivity to probe in all windows. IMPRESSIONS  1. Left ventricular ejection fraction, by estimation, is 55 to 60%. The left ventricle has normal function. Left ventricular diastolic function could not be evaluated.  2. Right  ventricular systolic function was not well visualized. The right ventricular size is moderately enlarged.  3. Left atrial size was mildly dilated.  4. The mitral valve is grossly normal. Trivial mitral valve regurgitation. No evidence of mitral stenosis.  5. The aortic valve is tricuspid. Aortic valve regurgitation is not visualized. Mild aortic valve sclerosis is present, with no evidence of aortic valve stenosis. Comparison(s): No significant change from prior study. FINDINGS  Left Ventricle: Left ventricular ejection fraction, by estimation, is 55 to 60%. The left ventricle has normal function. Definity contrast agent was given IV to delineate the left ventricular endocardial borders. The left ventricular internal cavity size was normal in size. There is no left ventricular hypertrophy. Left ventricular diastolic function could not be evaluated due to atrial fibrillation. Right Ventricle: The right ventricular size is moderately enlarged. Right vetricular wall thickness was not assessed. Right ventricular systolic function was not well visualized. Left Atrium: Left atrial size was mildly dilated. Right Atrium: Right atrial size was not well visualized. Pericardium: There is no evidence of pericardial effusion. Mitral Valve: The mitral valve is grossly normal. Moderate to severe mitral annular calcification. Trivial mitral valve regurgitation. No evidence of mitral valve stenosis. Tricuspid Valve: The tricuspid valve is normal in structure. Tricuspid valve regurgitation is trivial. No evidence of tricuspid stenosis. Aortic Valve: The aortic valve is tricuspid. . There is mild thickening and mild calcification of the aortic valve. Aortic valve regurgitation is not visualized. Mild aortic valve sclerosis is present, with no evidence of aortic valve stenosis. There is mild thickening of the aortic valve. There is mild calcification of the aortic valve. Pulmonic Valve: The pulmonic valve was not well visualized.  Pulmonic valve regurgitation is not visualized. No evidence of pulmonic stenosis. IAS/Shunts: The interatrial septum was not assessed.  LEFT VENTRICLE PLAX 2D LVIDd:         4.20 cm LVIDs:         3.10 cm LV PW:         0.90 cm LV IVS:        0.90 cm LVOT diam:     2.00 cm LVOT Area:     3.14 cm  LEFT ATRIUM           Index LA diam:      3.90 cm 1.73 cm/m LA Vol (A4C): 67.1 ml 29.84 ml/m   AORTA Ao Root diam: 3.10 cm MITRAL VALVE                TRICUSPID VALVE MV Area (PHT): 3.77 cm     TR Peak grad:   16.0 mmHg MV Decel Time: 201 msec     TR Vmax:        200.00 cm/s MV E velocity: 116.33 cm/s                             SHUNTS  Systemic Diam: 2.00 cm Buford Dresser MD Electronically signed by Buford Dresser MD Signature Date/Time: 03/13/2020/9:51:21 PM    Final     Nayab Aten T. [place]  If 7PM-7AM, please contact night-coverage [email or web address] Password [facility] 03/14/2020, 11:24 AM

## 2020-03-14 NOTE — Progress Notes (Signed)
Daily Progress Note   Patient Name: AHILYN GINGRAS       Date: 03/14/2020 DOB: [birth year]  Age: 86 y.o. MRN#: [id number] Attending Physician: Mercy Riding, MD Primary Care Physician: Mosie Lukes, MD Admit Date: 03/11/2020  Reason for Consultation/Follow-up: Establishing goals of care  Subjective: Did not like oxycodone. Tells me it made her feel like she was dying. Does not want to try anymore or any other opioids.  Length of Stay: 2  Current Medications: Scheduled Meds:  . acetaminophen  1,000 mg Oral Q6H  . apixaban  5 mg Oral BID  . diclofenac Sodium  2 g Topical BID  . diltiazem  360 mg Oral Daily  . losartan  25 mg Oral QHS  . nystatin  1 application Topical BID  . rOPINIRole  4 mg Oral QHS  . senna  2 tablet Oral QHS  . sodium chloride flush  3 mL Intravenous Once    Continuous Infusions:   PRN Meds: albuterol, hydrALAZINE, ondansetron **OR** ondansetron (ZOFRAN) IV, oxyCODONE, polyethylene glycol, traMADol  Physical Exam Constitutional:      General: She is not in acute distress. Pulmonary:     Effort: Pulmonary effort is normal. No respiratory distress.  Skin:    General: Skin is warm and dry.  Neurological:     Mental Status: She is alert and oriented to person, place, and time.  Psychiatric:        Mood and Affect: Mood normal.        Behavior: Behavior normal.             Vital Signs: BP (!) 145/92 (BP Location: Right Arm)   Pulse 64   Temp 97.8 F (36.6 C) (Oral)   Resp 20   Ht 5\' 8"  (1.727 m)   Wt 53.3 kg   LMP 12/25/2011   SpO2 97%   BMI 17.88 kg/m  SpO2: SpO2: 97 % O2 Device: O2 Device: Nasal Cannula O2 Flow Rate: O2 Flow Rate (L/min): 2 L/min  Intake/output summary:   Intake/Output Summary (Last 24 hours) at 03/14/2020 1031 Last data filed at  03/14/2020 [id number] Gross per 24 hour  Intake 480 ml  Output 2150 ml  Net -1670 ml   LBM: Last BM Date: 03/11/20 Baseline Weight: Weight: 104.3 kg Most recent weight: Weight: 53.3 kg       Palliative Assessment/Data: PPS 30%    Flowsheet Rows     Most Recent Value  Intake Tab  Referral Department  Hospitalist  Unit at Time of Referral  Cardiac/Telemetry Unit  Palliative Care Primary Diagnosis  Pain  Date Notified  03/12/20  Palliative Care Type  New Palliative care  Reason for referral  Pain, Clarify Goals of Care  Date of Admission  03/11/20  Date first seen by Palliative Care  03/13/20  # of days Palliative referral response time  1 Day(s)  # of days IP prior to Palliative referral  1  Clinical Assessment  Palliative Performance Scale Score  40%  Psychosocial & Spiritual Assessment  Palliative Care Outcomes  Patient/Family meeting held?  Yes  Who was at the meeting?  patient and daughters  Palliative Care Outcomes  Improved pain interventions, Improved  non-pain symptom therapy, Clarified goals of care, Counseled regarding hospice, Provided advance care planning, Provided psychosocial or spiritual support, Changed to focus on comfort, Linked to palliative care logitudinal support      Patient Active Problem List   Diagnosis Date Noted  . Goals of care, counseling/discussion   . Palliative care by specialist   . Hypokalemia 03/12/2020  . Generalized weakness 03/11/2020  . Candidiasis of genitalia in female 03/11/2020  . Elevated troponin 03/11/2020  . Hypomagnesemia 03/11/2020  . RLS (restless legs syndrome) 10/28/2019  . Pain of right hip joint 11/12/2017  . Unsteady gait 11/12/2017  . Fingernail abnormalities 11/11/2017  . Decreased visual acuity 01/25/2016  . Medicare annual wellness visit, subsequent 11/27/2014  . Constipation 11/22/2014  . Pulmonary edema 06/16/2012  . Intermittent claudication ([place]) 05/22/2012  . Cough 05/18/2012  . Chronic diastolic heart  failure ([place]) 05/12/2012  . OSA (obstructive sleep apnea) 04/24/2012  . LVH (left ventricular hypertrophy) 04/24/2012  . Hypertension   . Precancerous skin lesion   . Pelvic mass in female   . Debility 01/10/2011  . ATRIAL FIBRILLATION 08/14/2010  . URETHRAL CARUNCLE 04/05/2010  . Osteoarthritis 03/23/2010  . Hyperglycemia 01/10/2010  . GERD 01/10/2010  . Hyperthyroidism 02/09/2009  . Vitamin D deficiency 02/09/2009  . Hyperlipidemia 02/09/2009  . Obesity 02/09/2009  . Anemia, unspecified 02/09/2009  . DERMATITIS 02/09/2009  . Arthritis 02/09/2009  . URINARY FREQUENCY 02/09/2009  . UNS ADVRS EFF OTH RX MEDICINAL&BIOLOGICAL SBSTNC 02/09/2009    Palliative Care Assessment & Plan   HPI: 86 y.o. female  with past medical history of a fib on eliquis, HTN, HLD, OSA, hypothyroidism, OA, and RLS admitted on 03/11/2020 with weakness, R hip pain, and bilateral knee pain. Also complains of dysphagia.  Labs okay. Some pulmonary edema on initial chest x-ray - received one dose of lasix, on room air. She has refused surgical evaluation for MSK pain.  PMT consulted for [place] and symptom management.   Assessment: Ms. Kamerman tells me she did not like oxycodone at all and asks for it to be stopped. She does not want to trial any other opioids. She would like to continue her tylenol and ropinirole. She'd like to take ropinirole throughout the day. We discussed that typically this medication is only given at bedtime. We discussed a trial of long-acting ropinirole that she takes one dose at night. She is agreeable to trying this. Discussed with daughter as well who agrees.  Discussed with Dr. Cyndia Skeeters - he has added 2.5 oxycodone and tramadol.   Family concerned about discharge plan - talking to case management and social work. We discussed role of outpatient palliative care.   Recommendations/Plan:  GOALS: NO aggressive inventions, focus on quality of life and symptom management  Trial long acting  ropinirole at night  Continue tylenol  Palliative to follow outpatient - patient interested in hospice services when eligible  Code Status:  DNR  Prognosis:   Unable to determine  Discharge Planning:  To Be Determined  Care plan was discussed with patient, daughter Kennyth Lose, Dr. Cyndia Skeeters  Thank you for allowing the Palliative Medicine Team to assist in the care of this patient.   Total Time 25 minutes Prolonged Time Billed  no       Greater than 50%  of this time was spent counseling and coordinating care related to the above assessment and plan.  Juel Burrow, DNP, [facility] Palliative Medicine Team Team Phone # [phone]  Pager [phone]

## 2020-03-14 NOTE — Progress Notes (Signed)
    Durable Medical Equipment  (From admission, onward)         Start     Ordered   03/13/20 1035  For home use only DME Bedside commode  Once    Question:  Patient needs a bedside commode to treat with the following condition  Answer:  Unsteady gait   03/13/20 1036   03/13/20 1035  For home use only DME lightweight manual wheelchair with seat cushion  Once    Comments: Patient suffers from home which impairs their ability to perform daily activities like bathing, dressing, feeding, grooming and toileting in the home.  A cane, crutch or walker will not resolve  issue with performing activities of daily living. A wheelchair will allow patient to safely perform daily activities. Patient is not able to propel themselves in the home using a standard weight wheelchair due to arm weakness, endurance and general weakness. Patient can self propel in the lightweight wheelchair. Length of need Lifetime. Accessories: elevating leg rests (ELRs), wheel locks, extensions and anti-tippers.   03/13/20 1036   03/13/20 1034  For home use only DME Hospital bed  Once    Question Answer Comment  Length of Need Lifetime   The above medical condition requires: Patient requires the ability to reposition frequently   [place] must be elevated greater than: 30 degrees   Bed type Semi-electric   [employer] Yes   Trapeze Bar Yes   Support Surface: Gel Overlay      03/13/20 1033

## 2020-03-14 NOTE — Progress Notes (Signed)
Physical Therapy Treatment Patient Details Name: Gina Reilly MRN: [id number] DOB: 09-13-1924 Today's Date: 03/14/2020    History of Present Illness Gina Reilly is a 86 y.o. female with medical history significant of chronic atrial fibrillation on Eliquis, hypertension, hyperlipidemia, hypothyroidism, and hyperglycemia who present with complaints of worsening weakness.    PT Comments    Patient received in bed, agreeable to PT session. She demonstrates improved functional mobility this visit. Somewhat self limiting due to pain in right LE. Reports skin is tender when attempting to assist her. She requires encouragement to do as much as she can. Requiring mod +2 assist for all bed mobility. She is unable/unwilling to attempt fully sitting up on the side of the bed, but got partially sitting while leaning to her left side. Patient will continue to benefit from skilled PT while here to improve her strength and functional independence to reduce caregiver burden.       Follow Up Recommendations  Home health PT;Supervision/Assistance - 24 hour     Equipment Recommendations  Wheelchair (measurements PT);Wheelchair cushion (measurements PT)    Recommendations for Other Services       Precautions / Restrictions Restrictions Weight Bearing Restrictions: No    Mobility  Bed Mobility Overal bed mobility: Needs Assistance Bed Mobility: Rolling;Supine to Sit;Sit to Supine Rolling: Mod assist   Supine to sit: Mod assist;+2 for physical assistance Sit to supine: Mod assist;+2 for physical assistance   General bed mobility comments: patient was able to get to semi- seated position leaning on her left side. She was unwilling to attempt sitting all the way up. She was able to assist with all mobility and did well scooting up in bed in supine using bed rails.  Transfers                 General transfer comment: unable  Ambulation/Gait             General Gait Details:  unable   Stairs             Wheelchair Mobility    Modified Rankin (Stroke Patients Only)       Balance Overall balance assessment: History of Falls;Needs assistance Sitting-balance support: Bilateral upper extremity supported Sitting balance-Leahy Scale: Poor Sitting balance - Comments: unable/unwilling to attempt sitting fully upright on edge of bed. Postural control: Left lateral lean                                  Cognition Arousal/Alertness: Awake/alert Behavior During Therapy: WFL for tasks assessed/performed Overall Cognitive Status: Within Functional Limits for tasks assessed                                 General Comments: patient cooperative and very pleasant. Somewhat self limiting due to pain/fear of pain.      Exercises Other Exercises Other Exercises: LAQ while legs dangling off side of bed. x 5 reps each    General Comments        Pertinent Vitals/Pain Pain Assessment: Faces Faces Pain Scale: Hurts little more Pain Location: skin, right hip and knee Pain Descriptors / Indicators: Tender;Discomfort Pain Intervention(s): Monitored during session;Repositioned;Premedicated before session    Home Living                      Prior Function  PT Goals (current goals can now be found in the care plan section) Acute Rehab PT Goals Patient Stated Goal: no pain, to be able to use the bathroom PT Goal Formulation: With patient/family Time For Goal Achievement: 03/26/20 Potential to Achieve Goals: Fair Progress towards PT goals: Progressing toward goals    Frequency    Min 3X/week      PT Plan Current plan remains appropriate    Co-evaluation PT/OT/SLP Co-Evaluation/Treatment: Yes Reason for Co-Treatment: For patient/therapist safety;To address functional/ADL transfers PT goals addressed during session: Mobility/safety with mobility OT goals addressed during session: ADL's and self-care       AM-PAC PT "6 Clicks" Mobility   Outcome Measure  Help needed turning from your back to your side while in a flat bed without using bedrails?: A Lot Help needed moving from lying on your back to sitting on the side of a flat bed without using bedrails?: A Lot Help needed moving to and from a bed to a chair (including a wheelchair)?: Total Help needed standing up from a chair using your arms (e.g., wheelchair or bedside chair)?: Total Help needed to walk in hospital room?: Total Help needed climbing 3-5 steps with a railing? : Total 6 Click Score: 8    End of Session   Activity Tolerance: Patient limited by fatigue;Patient limited by pain Patient left: in bed;with call bell/phone within reach Nurse Communication: Mobility status PT Visit Diagnosis: Muscle weakness (generalized) (M62.81);Pain;Other abnormalities of gait and mobility (R26.89) Pain - Right/Left: Right Pain - part of body: Hip;Leg     Time: [id number] PT Time Calculation (min) (ACUTE ONLY): 31 min  Charges:  $Therapeutic Activity: 8-22 mins                     [employer], PT, GCS 03/14/20,3:01 PM

## 2020-03-14 NOTE — NC FL2 (Signed)
[place] LEVEL OF CARE SCREENING TOOL     IDENTIFICATION  Patient Name: Gina Reilly Birthdate: 10-02-1924 Sex: female Admission Date (Current Location): 03/11/2020  [facility] [place] and [place] Number:  Herbalist and Address:  [facility], [place] [address], [place], [place] [address]      Provider Number: [id number]  Attending Physician Name and Address:  Mercy Riding, MD  Relative Name and Phone Number:  Hassan Rowan (daughter) [phone]    Current Level of Care: Hospital Recommended Level of Care: [place] Prior Approval Number:    Date Approved/Denied:   PASRR Number: [id number]  Discharge Plan: SNF    Current Diagnoses: Patient Active Problem List   Diagnosis Date Noted  . Goals of care, counseling/discussion   . Palliative care by specialist   . Hypokalemia 03/12/2020  . Generalized weakness 03/11/2020  . Candidiasis of genitalia in female 03/11/2020  . Elevated troponin 03/11/2020  . Hypomagnesemia 03/11/2020  . RLS (restless legs syndrome) 10/28/2019  . Pain of right hip joint 11/12/2017  . Unsteady gait 11/12/2017  . Fingernail abnormalities 11/11/2017  . Decreased visual acuity 01/25/2016  . Medicare annual wellness visit, subsequent 11/27/2014  . Constipation 11/22/2014  . Pulmonary edema 06/16/2012  . Intermittent claudication ([place]) 05/22/2012  . Cough 05/18/2012  . Chronic diastolic heart failure ([place]) 05/12/2012  . OSA (obstructive sleep apnea) 04/24/2012  . LVH (left ventricular hypertrophy) 04/24/2012  . Hypertension   . Precancerous skin lesion   . Pelvic mass in female   . Debility 01/10/2011  . ATRIAL FIBRILLATION 08/14/2010  . URETHRAL CARUNCLE 04/05/2010  . Osteoarthritis 03/23/2010  . Hyperglycemia 01/10/2010  . GERD 01/10/2010  . Hyperthyroidism 02/09/2009  . Vitamin D deficiency 02/09/2009  . Hyperlipidemia 02/09/2009  . Obesity 02/09/2009  . Anemia, unspecified 02/09/2009   . DERMATITIS 02/09/2009  . Arthritis 02/09/2009  . URINARY FREQUENCY 02/09/2009  . UNS ADVRS EFF OTH RX MEDICINAL&BIOLOGICAL SBSTNC 02/09/2009    Orientation RESPIRATION BLADDER Height & Weight     Self, Time, Situation, Place  O2(2L nasal cannula) Incontinent, External catheter Weight: 117 lb 9.6 oz (53.3 kg) Height:  5\' 8"  (172.7 cm)  BEHAVIORAL SYMPTOMS/MOOD NEUROLOGICAL BOWEL NUTRITION STATUS      Continent Diet(see discharge summary)  AMBULATORY STATUS COMMUNICATION OF NEEDS Skin   Limited Assist Verbally Other (Comment)(MASD buttocks, rash on groin)                       Personal Care Assistance Level of Assistance  Bathing, Dressing, Total care, Feeding Bathing Assistance: Limited assistance Feeding assistance: Independent Dressing Assistance: Limited assistance Total Care Assistance: Limited assistance   Functional Limitations Info  Sight, Speech, Hearing Sight Info: Impaired Hearing Info: Impaired Speech Info: Adequate    SPECIAL CARE FACTORS FREQUENCY  PT (By licensed PT), OT (By licensed OT)     PT Frequency: min 5x weekly OT Frequency: min 5x weekly            Contractures Contractures Info: Not present    Additional Factors Info  Code Status, Allergies Code Status Info: DNR Allergies Info: Amiodarone, Aspercreme (trolamine salicylate)           Current Medications (03/14/2020):  This is the current hospital active medication list Current Facility-Administered Medications  Medication Dose Route Frequency Provider Last Rate Last Admin  . acetaminophen (TYLENOL) tablet 1,000 mg  1,000 mg Oral Q6H Gonfa, Taye T, MD   1,000 mg  at 03/14/20 0930  . albuterol (PROVENTIL) (2.5 MG/3ML) 0.083% nebulizer solution 2.5 mg  2.5 mg Nebulization Q6H PRN Fuller Plan A, MD   2.5 mg at 03/13/20 1918  . apixaban (ELIQUIS) tablet 5 mg  5 mg Oral BID Fuller Plan A, MD   5 mg at 03/14/20 0931  . diclofenac Sodium (VOLTAREN) 1 % topical gel 2 g  2 g Topical  BID Fuller Plan A, MD   2 g at 03/14/20 0931  . diltiazem (CARDIZEM CD) 24 hr capsule 360 mg  360 mg Oral Daily Smith, Rondell A, MD   360 mg at 03/14/20 0931  . hydrALAZINE (APRESOLINE) injection 5 mg  5 mg Intravenous Q2H PRN Fuller Plan A, MD      . losartan (COZAAR) tablet 25 mg  25 mg Oral QHS Smith, Rondell A, MD   25 mg at 03/13/20 2127  . nystatin (MYCOSTATIN/NYSTOP) topical powder 1 application  1 application Topical BID Norval Morton, MD   1 application at [id number]  . ondansetron (ZOFRAN) tablet 4 mg  4 mg Oral Q6H PRN Fuller Plan A, MD       Or  . ondansetron (ZOFRAN) injection 4 mg  4 mg Intravenous Q6H PRN Smith, Rondell A, MD      . oxyCODONE (Oxy IR/ROXICODONE) immediate release tablet 2.5 mg  2.5 mg Oral Q8H PRN Gonfa, Taye T, MD      . polyethylene glycol (MIRALAX / GLYCOLAX) packet 17 g  17 g Oral Daily PRN Philis Pique, NP      . rOPINIRole (REQUIP XL) 24 hr tablet 4 mg  4 mg Oral QHS Shaffer, Johnell Comings, NP      . senna (SENOKOT) tablet 17.2 mg  2 tablet Oral QHS Philis Pique, NP   17.2 mg at 03/13/20 2126  . sodium chloride flush (NS) 0.9 % injection 3 mL  3 mL Intravenous Once Smith, Rondell A, MD      . traMADol (ULTRAM) tablet 50 mg  50 mg Oral Q12H PRN Mercy Riding, MD         Discharge Medications: Please see discharge summary for a list of discharge medications.  Relevant Imaging Results:  Relevant Lab Results:   Additional Information SSN: [id number]  Alberteen Sam, LCSW

## 2020-03-14 NOTE — Progress Notes (Signed)
Occupational Therapy Treatment Patient Details Name: Gina Reilly MRN: [id number] DOB: 26-Jan-1924 Today's Date: 03/14/2020    History of present illness Gina Reilly is a 86 y.o. female with medical history significant of chronic atrial fibrillation on Eliquis, hypertension, hyperlipidemia, hypothyroidism, and hyperglycemia who present with complaints of worsening weakness.   OT comments  Pt seen in conjunction with PT to progress functional mobility. Overall, pt requires MOD A +2 to transition from supine<>sit with increased time and step by step cues with heavy use of bed features. Pt declined sitting fully upright pt preferred leaning to L against elevated HOB. Pt completed seated grooming tasks at EOB with set-up assist however pt leaning heavily against L HOB during tasks. Pts family wanting to take pt home although SNF continues to be most appropriate DC plan based on pts level of function. Discussed DME needs with pt and encouraged pt to work on progressing mobility at home with [facility]. Will continue to follow acutely per POC.    Follow Up Recommendations  SNF;Supervision/Assistance - 24 hour;Other (comment)(unless family willing and able to provide 24/7 total care)    Equipment Recommendations  3 in 1 bedside commode;Wheelchair (measurements OT);Wheelchair cushion (measurements OT);Hospital bed;Other (comment)(hoyer lift with hoyer lift pads)    Recommendations for Other Services      Precautions / Restrictions Precautions Precautions: Fall Precaution Comments: painful R hip; skin sensitive to touch Restrictions Weight Bearing Restrictions: No       Mobility Bed Mobility Overal bed mobility: Needs Assistance Bed Mobility: Rolling;Supine to Sit;Sit to Supine Rolling: Mod assist   Supine to sit: Mod assist;+2 for physical assistance Sit to supine: Mod assist;+2 for physical assistance   General bed mobility comments: patient was able to get to semi- seated position leaning on  her left side. She was unwilling to attempt sitting all the way up. She was able to assist with all mobility and did well scooting up in bed in supine using bed rails.  Transfers                 General transfer comment: unable as pt declines    Balance Overall balance assessment: History of Falls;Needs assistance Sitting-balance support: Bilateral upper extremity supported;Feet supported Sitting balance-Leahy Scale: Poor Sitting balance - Comments: unable/unwilling to attempt sitting fully upright on edge of bed. Postural control: Left lateral lean                                 ADL either performed or assessed with clinical judgement   ADL Overall ADL's : Needs assistance/impaired     Grooming: Wash/dry face;Sitting;Brushing hair;Supervision/safety;Set up Grooming Details (indicate cue type and reason): from EOB with pt leaning to L side with HOB all the way elevated; education provided on DME needs related to grooming tasks that pt could utilize at home                   Toilet Transfer Details (indicate cue type and reason): pt declined OOB transfer; seems to be slightly self limiting; MOD A +2 to come to EOB with HOB elevated         Functional mobility during ADLs: Moderate assistance;+2 for physical assistance;+2 for safety/equipment;Cueing for safety;Cueing for sequencing(bed mobility only) General ADL Comments: pt appears to be slightly self limiting declining OOB mobility or attempting to shift trunk from leaning to pts L side against elevated HOB. pt required increased time  and encouragment to transition EOB with MOD A+2. Continued to encourage pt to work towards pivotingto [facility] at home as pt reports she doesn't want to have to use bed pan     Vision Baseline Vision/History: Wears glasses Wears Glasses: At all times Patient Visual Report: No change from baseline     Perception     Praxis      Cognition Arousal/Alertness:  Awake/alert Behavior During Therapy: WFL for tasks assessed/performed;Anxious Overall Cognitive Status: Within Functional Limits for tasks assessed                                 General Comments: patient cooperative and very pleasant. Somewhat self limiting due to pain/fear of pain.        Exercises Other Exercises Other Exercises: LAQ while legs dangling off side of bed. x 5 reps each   Shoulder Instructions       General Comments pt gown noted to be wet; assisted pt with changing gown from semireclined position. Education on appropriate DME pt could utilize at home as pt daughter reports she will likely stay in bed most of the day. Encouraged pt to try to work with [facility] to be able to at least pivot to [facility] or laterally scoot over to 3n1    Pertinent Vitals/ Pain       Pain Assessment: Faces Faces Pain Scale: Hurts little more Pain Location: skin, right hip and knee Pain Descriptors / Indicators: Tender;Discomfort Pain Intervention(s): Limited activity within patient's tolerance;Monitored during session  Home Living                                          Prior Functioning/Environment              Frequency  Min 2X/week        Progress Toward Goals  OT Goals(current goals can now be found in the care plan section)  Progress towards OT goals: Progressing toward goals  Acute Rehab OT Goals Patient Stated Goal: no pain, to be able to use the bathroom OT Goal Formulation: With patient/family Time For Goal Achievement: 03/26/20 Potential to Achieve Goals: Good  Plan Discharge plan remains appropriate    Co-evaluation    PT/OT/SLP Co-Evaluation/Treatment: Yes Reason for Co-Treatment: For patient/therapist safety;To address functional/ADL transfers PT goals addressed during session: Mobility/safety with mobility OT goals addressed during session: ADL's and self-care      AM-PAC OT "6 Clicks" Daily Activity     Outcome  Measure   Help from another person eating meals?: A Little Help from another person taking care of personal grooming?: A Little Help from another person toileting, which includes using toliet, bedpan, or urinal?: Total Help from another person bathing (including washing, rinsing, drying)?: A Lot Help from another person to put on and taking off regular upper body clothing?: A Lot Help from another person to put on and taking off regular lower body clothing?: Total 6 Click Score: 12    End of Session    OT Visit Diagnosis: Unsteadiness on feet (R26.81);Other abnormalities of gait and mobility (R26.89);Muscle weakness (generalized) (M62.81);History of falling (Z91.81);Pain Pain - Right/Left: Right Pain - part of body: Hip;Knee;Leg   Activity Tolerance Patient limited by pain;Patient limited by fatigue   Patient Left in bed;with call bell/phone within reach;with bed alarm set  Nurse Communication          Time: [id number] OT Time Calculation (min): 31 min  Charges: OT General Charges $OT Visit: 1 Visit OT Treatments $Self Care/Home Management : 8-22 mins  Lanier Clam., COTA/L Acute Rehabilitation Services [phone]    Ihor Gully 03/14/2020, 3:29 PM

## 2020-03-14 NOTE — Plan of Care (Signed)

## 2020-03-15 LAB — RENAL FUNCTION PANEL
Albumin: 3.4 g/dL — ABNORMAL LOW (ref 3.5–5.0)
Anion gap: 9 (ref 5–15)
BUN: 11 mg/dL (ref 8–23)
CO2: 25 mmol/L (ref 22–32)
Calcium: 9.3 mg/dL (ref 8.9–10.3)
Chloride: 102 mmol/L (ref 98–111)
Creatinine, Ser: 0.72 mg/dL (ref 0.44–1.00)
GFR calc Af Amer: >60 mL/min (ref >60)
GFR calc non Af Amer: >60 mL/min (ref >60)
Glucose, Bld: 129 mg/dL — ABNORMAL HIGH (ref 70–99)
Phosphorus: 4.2 mg/dL (ref 2.5–4.6)
Potassium: 4.7 mmol/L (ref 3.5–5.1)
Sodium: 136 mmol/L (ref 135–145)

## 2020-03-15 LAB — CBC
HCT: 41.6 % (ref 36.0–46.0)
Hemoglobin: 13.1 g/dL (ref 12.0–15.0)
MCH: 33.9 pg (ref 26.0–34.0)
MCHC: 31.5 g/dL (ref 30.0–36.0)
MCV: 107.8 fL — ABNORMAL HIGH (ref 80.0–100.0)
Platelets: 211 10*3/uL (ref 150–400)
RBC: 3.86 MIL/uL — ABNORMAL LOW (ref 3.87–5.11)
RDW: 14.9 % (ref 11.5–15.5)
WBC: 8.1 10*3/uL (ref 4.0–10.5)
nRBC: 0 % (ref 0.0–0.2)

## 2020-03-15 LAB — CK: Total CK: 133 U/L (ref 38–234)

## 2020-03-15 LAB — MAGNESIUM: Magnesium: 2 mg/dL (ref 1.7–2.4)

## 2020-03-15 MED ORDER — VITAMIN B-12 100 MCG PO TABS
100.0000 ug | ORAL_TABLET | Freq: Every day | ORAL | Status: DC
  Administered 2020-03-15 – 2020-03-16 (×2): 100 ug via ORAL
  Filled 2020-03-15 (×3): qty 1

## 2020-03-15 MED ORDER — VITAMIN D 25 MCG (1000 UNIT) PO TABS
2000.0000 [IU] | ORAL_TABLET | Freq: Every day | ORAL | Status: DC
  Administered 2020-03-15 – 2020-03-16 (×2): 2000 [IU] via ORAL
  Filled 2020-03-15 (×4): qty 2

## 2020-03-15 NOTE — Progress Notes (Addendum)
PROGRESS NOTE    Gina Reilly  HGD:[id number] DOB: [birth year] DOA: 03/11/2020 PCP: Mosie Lukes, MD   Brief Narrative: 86 year old with past medical history significant for chronic A. fib on Eliquis, hypertension, HLD, OSA, hypothyroidism, osteoarthritis and restless leg syndrome who presented with progressive generalized weakness, right hip and knee pain and dysphagia.  In the ED patient was noted to be in A. fib with RVR with heart rate in the 150.  Blood pressure was elevated.  Chest x-ray with mild cardiomegaly and mild edema.  Received IV Lasix x1 and admitted for generalized weakness, A. fib RVR and pain.  A. fib RVR has resolved.  Echocardiogram without significant findings.  Palliative care consulted and met with patient and family.  Goal is to make sure patient is comfortable.  Patient was a started on oxycodone 5 mg every 3 hours as needed for severe pain.  She received 2 doses on 5/17 and had significant nausea and vomiting.  Patient was then started on a scheduled Tylenol, tramadol 50 mg twice daily as needed and oxycodone dose was reduced to 2.5 mg every 8 hours as needed.  She is doing better with current regimen. Patient Requip was increased as well.  Plan is to proceed with discharge on 5/20 after a safe discharge plan is in place.   Assessment & Plan:   Principal Problem:   Generalized weakness Active Problems:   Obesity   ATRIAL FIBRILLATION   Osteoarthritis   Pulmonary edema   Candidiasis of genitalia in female   Elevated troponin   Hypomagnesemia   Hypokalemia   Goals of care, counseling/discussion   Palliative care by specialist  1-Generalized  weakness: Her A. fib could have play a role.  Medication as well like Cardizem. TSH within normal limits. Palliative care consulted, appreciate their assistance.  Main goal is to make sure that patient is comfortable. I will resume vitamin D and start vitamin B12.  2-Acute on  chronic pain/osteoarthritis,  chronic: Right hip x-ray with osteoarthritis but no acute finding. Continue with Tylenol scheduled, tramadol 50 mg twice daily as needed Oxycodone low-dose 2.5 every 8 hours as needed.  3-Persistent A. fib with RVR: Resolved continue with Cardizem and Eliquis  4-Abnormal chest x-ray some concern about pulmonary edema congestion: Received a dose of IV Laxis. Oxygen for comfort. Negative 6 L without diuretics. Hold diuretics for now.  Uncontrolled hypertension: Continue with Cardizem and losartan Hypomagnesemia: Resolved Mild elevation of troponin: likely demand ischemia related to A. fib RVR. Restless leg syndrome:  Requip changed to 4 mg extended release at bedtime Cutaneous candidiasis of the pannus and groin area: Continue with nystatin Macrocytosis; start B 12 supplement.  Hyperkalemia; resolved.  Urinary incontinence, bed bound; will need female external catheter   Device to help prevent bedsore.   Estimated body mass index is 37.48 kg/m as calculated from the following:   Height as of this encounter: _0  (1.727 m).   Weight as of this encounter: 111.8 kg.   DVT prophylaxis: Eliquis Code Status: DNR Family Communication: Daughter who was at bedside Disposition Plan:  Status is: Inpatient, arranging equipment and around the clock [facility] care.   Remains inpatient appropriate because:Unsafe d/c plan   Dispo: The patient is from: Home              Anticipated d/c is to: Home              Anticipated d/c date is: 1 day  Patient currently is not medically stable to d/c. currently arranging safe discharge plan.         Consultants:   Palliative care team  Procedures:   ECHO; Left ventricular ejection fraction, by estimation, is 55 to 60%. The  left ventricle has normal function. Left ventricular diastolic function  could not be evaluated.  2. Right ventricular systolic function was not well visualized. The right  ventricular size is moderately  enlarged.  3. Left atrial size was mildly dilated.  4. The mitral valve is grossly normal. Trivial mitral valve  regurgitation. No evidence of mitral stenosis.  5. The aortic valve is tricuspid. Aortic valve regurgitation is not  visualized. Mild aortic valve sclerosis is present, with no evidence of  aortic valve stenosis.   Antimicrobials:  none  Subjective: Patient is a sleepy, she open eyes to voice.  She was able to sleep on and off last night.  She thinks that she is going to be sleepy during the day today. She feels that she is not getting enough oxygen, she was using 5 L of oxygen at home when EMS arrived.  She is currently on 2 L of oxygen, although she remove it because she was feeling that she was not getting enough.  I put her back and increase it to 3 L and her oxygen saturation was 97%.  She feels comfortable with 3 L currently.  Denies worsening dyspnea.   Objective: Vitals:   03/14/20 2010 03/15/20 0433 03/15/20 0438 03/15/20 1155  BP: 140/77 137/78  (!) 155/96  Pulse: 61 71  80  Resp: _0 Temp: 97.7 F (36.5 C) 97.9 F (36.6 C)  97.9 F (36.6 C)  TempSrc: Oral Oral  Oral  SpO2: 95% 95%  97%  Weight:   111.8 kg   Height:        Intake/Output Summary (Last 24 hours) at 03/15/2020 1402 Last data filed at 03/15/2020 1303 Gross per 24 hour  Intake 480 ml  Output 2650 ml  Net -2170 ml   Filed Weights   03/14/20 1100 03/14/20 1153 03/15/20 0438  Weight: 112 kg 112.4 kg 111.8 kg    Examination:  General exam: Appears calm and comfortable  Respiratory system: Clear to auscultation. Respiratory effort normal. Cardiovascular system: S1 & S2 heard, RRR.   Gastrointestinal system: Abdomen is nondistended, soft and nontender. No organomegaly or masses felt. Normal bowel sounds heard. Central nervous system: Sleepy, open eyes to voice, answer few questions Extremities: Trace edema   Data Reviewed: I have personally reviewed following labs and imaging  studies  CBC: Recent Labs  Lab 03/11/20 1023 03/12/20 0332 03/15/20 0410  WBC 8.0 7.5 8.1  HGB 13.8 12.3 13.1  HCT 43.5 38.7 41.6  MCV 106.1* 103.5* 107.8*  PLT 184 180 712   Basic Metabolic Panel: Recent Labs  Lab 03/11/20 1023 03/11/20 1229 03/11/20 1458 03/12/20 0332 03/13/20 0506 03/15/20 0410 03/15/20 0736  NA 136  --   --  136 133*  --  136  K 6.3* 4.0  --  3.3* 4.6  --  4.7  CL 102  --   --  103 102  --  102  CO2 25  --   --  24 24  --  25  GLUCOSE 135*  --   --  125* 164*  --  129*  BUN 17  --   --  11 16  --  11  CREATININE 0.93  --   --  0.84 0.85  --  0.72  CALCIUM 9.6  --   --  8.9 9.1  --  9.3  MG  --   --  1.6* 1.9 1.9 2.0  --   PHOS  --   --   --   --  4.1  --  4.2   GFR: Estimated Creatinine Clearance: 56.4 mL/min (by C-G formula based on SCr of 0.72 mg/dL). Liver Function Tests: Recent Labs  Lab 03/12/20 0332 03/13/20 0506 03/15/20 0736  AST 20  --   --   ALT 18  --   --   ALKPHOS 46  --   --   BILITOT 1.1  --   --   PROT 5.8*  --   --   ALBUMIN 3.3* 3.4* 3.4*   No results for input(s): LIPASE, AMYLASE in the last 168 hours. No results for input(s): AMMONIA in the last 168 hours. Coagulation Profile: No results for input(s): INR, PROTIME in the last 168 hours. Cardiac Enzymes: Recent Labs  Lab 03/15/20 0410  CKTOTAL 133   BNP (last 3 results) No results for input(s): PROBNP in the last 8760 hours. HbA1C: Recent Labs    03/13/20 0845  HGBA1C 6.2*   CBG: Recent Labs  Lab 03/11/20 1117  GLUCAP 130*   Lipid Profile: No results for input(s): CHOL, HDL, LDLCALC, TRIG, CHOLHDL, LDLDIRECT in the last 72 hours. Thyroid Function Tests: No results for input(s): TSH, T4TOTAL, FREET4, T3FREE, THYROIDAB in the last 72 hours. Anemia Panel: No results for input(s): VITAMINB12, FOLATE, FERRITIN, TIBC, IRON, RETICCTPCT in the last 72 hours. Sepsis Labs: No results for input(s): PROCALCITON, LATICACIDVEN in the last 168 hours.  No  results found for this or any previous visit (from the past 240 hour(s)).       Radiology Studies: ECHOCARDIOGRAM LIMITED  Result Date: 03/13/2020    ECHOCARDIOGRAM LIMITED REPORT   Patient Name:   PLEASANT BRITZ Wedig Date of Exam: 03/13/2020 Medical Rec #:  [id number]    Height:       68.0 in Accession #:    [id number]   Weight:       250.4 lb Date of Birth:  1924-01-06    BSA:          2.249 m Patient Age:    36 years     BP:           137/90 mmHg Patient Gender: F            HR:           102 bpm. Exam Location:  Inpatient Procedure: Limited Echo, Color Doppler, Cardiac Doppler and Intracardiac            Opacification Agent Indications:    Atrial Fibrillation 427.31 / I48.91  History:        Patient has prior history of Echocardiogram examinations, most                 recent 08/22/2015. LVH; Risk Factors:Elevated troponin, Sleep                 Apnea and Hypertension.  Sonographer:    Vikki Ports Turrentine Referring Phys: [id number] [facility]  Sonographer Comments: Technically difficult study due to patient positioning and extreme sensitivity to probe in all windows. IMPRESSIONS  1. Left ventricular ejection fraction, by estimation, is 55 to 60%. The left ventricle has normal function. Left ventricular diastolic function could not be evaluated.  2. Right ventricular systolic function was  not well visualized. The right ventricular size is moderately enlarged.  3. Left atrial size was mildly dilated.  4. The mitral valve is grossly normal. Trivial mitral valve regurgitation. No evidence of mitral stenosis.  5. The aortic valve is tricuspid. Aortic valve regurgitation is not visualized. Mild aortic valve sclerosis is present, with no evidence of aortic valve stenosis. Comparison(s): No significant change from prior study. FINDINGS  Left Ventricle: Left ventricular ejection fraction, by estimation, is 55 to 60%. The left ventricle has normal function. Definity contrast agent was given IV to delineate the left  ventricular endocardial borders. The left ventricular internal cavity size was normal in size. There is no left ventricular hypertrophy. Left ventricular diastolic function could not be evaluated due to atrial fibrillation. Right Ventricle: The right ventricular size is moderately enlarged. Right vetricular wall thickness was not assessed. Right ventricular systolic function was not well visualized. Left Atrium: Left atrial size was mildly dilated. Right Atrium: Right atrial size was not well visualized. Pericardium: There is no evidence of pericardial effusion. Mitral Valve: The mitral valve is grossly normal. Moderate to severe mitral annular calcification. Trivial mitral valve regurgitation. No evidence of mitral valve stenosis. Tricuspid Valve: The tricuspid valve is normal in structure. Tricuspid valve regurgitation is trivial. No evidence of tricuspid stenosis. Aortic Valve: The aortic valve is tricuspid. . There is mild thickening and mild calcification of the aortic valve. Aortic valve regurgitation is not visualized. Mild aortic valve sclerosis is present, with no evidence of aortic valve stenosis. There is mild thickening of the aortic valve. There is mild calcification of the aortic valve. Pulmonic Valve: The pulmonic valve was not well visualized. Pulmonic valve regurgitation is not visualized. No evidence of pulmonic stenosis. IAS/Shunts: The interatrial septum was not assessed.  LEFT VENTRICLE PLAX 2D LVIDd:         4.20 cm LVIDs:         3.10 cm LV PW:         0.90 cm LV IVS:        0.90 cm LVOT diam:     2.00 cm LVOT Area:     3.14 cm  LEFT ATRIUM           Index LA diam:      3.90 cm 1.73 cm/m LA Vol (A4C): 67.1 ml 29.84 ml/m   AORTA Ao Root diam: 3.10 cm MITRAL VALVE                TRICUSPID VALVE MV Area (PHT): 3.77 cm     TR Peak grad:   16.0 mmHg MV Decel Time: 201 msec     TR Vmax:        200.00 cm/s MV E velocity: 116.33 cm/s                             SHUNTS                              Systemic Diam: 2.00 cm Buford Dresser MD Electronically signed by Buford Dresser MD Signature Date/Time: 03/13/2020/9:51:21 PM    Final         Scheduled Meds: . acetaminophen  1,000 mg Oral Q6H  . apixaban  5 mg Oral BID  . diclofenac Sodium  2 g Topical BID  . diltiazem  360 mg Oral Daily  . losartan  25 mg Oral QHS  .  nystatin  1 application Topical BID  . rOPINIRole  4 mg Oral QHS  . senna  2 tablet Oral QHS  . sodium chloride flush  3 mL Intravenous Once  . vitamin B-12  100 mcg Oral Daily  . Vitamin D  2,000 Units Oral Daily   Continuous Infusions:   LOS: 3 days    Time spent: 35 minutes.     Elmarie Shiley, MD [facility]   If 7PM-7AM, please contact night-coverage [email or web address]  03/15/2020, 2:02 PM

## 2020-03-15 NOTE — Progress Notes (Signed)
Daily Progress Note   Patient Name: Gina Reilly       Date: 03/15/2020 DOB: 08-19-1924  Age: 86 y.o. MRN#: [id number] Attending Physician: Elmarie Shiley, MD Primary Care Physician: Mosie Lukes, MD Admit Date: 03/11/2020  Reason for Consultation/Follow-up: Establishing goals of care  Subjective: Sleeping soundly - did not wake. Daughter at bedside. Daughter with concerns about equipment setup at home - to talk to case manager later today. No symptom management concerns.   Length of Stay: 3  Current Medications: Scheduled Meds:  . acetaminophen  1,000 mg Oral Q6H  . apixaban  5 mg Oral BID  . diclofenac Sodium  2 g Topical BID  . diltiazem  360 mg Oral Daily  . losartan  25 mg Oral QHS  . nystatin  1 application Topical BID  . rOPINIRole  4 mg Oral QHS  . senna  2 tablet Oral QHS  . sodium chloride flush  3 mL Intravenous Once    Continuous Infusions:   PRN Meds: albuterol, hydrALAZINE, ondansetron **OR** ondansetron (ZOFRAN) IV, oxyCODONE, polyethylene glycol, traMADol  Physical Exam Constitutional:      General: She is not in acute distress.    Comments: Sleeping soundly  Pulmonary:     Effort: Pulmonary effort is normal. No respiratory distress.  Skin:    General: Skin is warm and dry.  Neurological:     Mental Status: She is oriented to person, place, and time.  Psychiatric:        Mood and Affect: Mood normal.        Behavior: Behavior normal.             Vital Signs: BP 137/78 (BP Location: Left Wrist)   Pulse 71   Temp 97.9 F (36.6 C) (Oral)   Resp 19   Ht 5\' 8"  (1.727 m)   Wt 111.8 kg   LMP 12/25/2011   SpO2 95%   BMI 37.48 kg/m  SpO2: SpO2: 95 % O2 Device: O2 Device: Nasal Cannula O2 Flow Rate: O2 Flow Rate (L/min): 2 L/min  Intake/output summary:     Intake/Output Summary (Last 24 hours) at 03/15/2020 [id number] Last data filed at 03/15/2020 [id number] Gross per 24 hour  Intake 480 ml  Output 2750 ml  Net -2270 ml   LBM: Last BM Date: 03/14/20 Baseline Weight: Weight: 104.3 kg Most recent weight: Weight: 111.8 kg       Palliative Assessment/Data: PPS 30%    Flowsheet Rows     Most Recent Value  Intake Tab  Referral Department  Hospitalist  Unit at Time of Referral  Cardiac/Telemetry Unit  Palliative Care Primary Diagnosis  Pain  Date Notified  03/12/20  Palliative Care Type  New Palliative care  Reason for referral  Pain, Clarify Goals of Care  Date of Admission  03/11/20  Date first seen by Palliative Care  03/13/20  # of days Palliative referral response time  1 Day(s)  # of days IP prior to Palliative referral  1  Clinical Assessment  Palliative Performance Scale Score  40%  Psychosocial & Spiritual Assessment  Palliative Care Outcomes  Patient/Family meeting held?  Yes  Who was at the meeting?  patient  and daughters  Palliative Care Outcomes  Improved pain interventions, Improved non-pain symptom therapy, Clarified goals of care, Counseled regarding hospice, Provided advance care planning, Provided psychosocial or spiritual support, Changed to focus on comfort, Linked to palliative care logitudinal support      Patient Active Problem List   Diagnosis Date Noted  . Goals of care, counseling/discussion   . Palliative care by specialist   . Hypokalemia 03/12/2020  . Generalized weakness 03/11/2020  . Candidiasis of genitalia in female 03/11/2020  . Elevated troponin 03/11/2020  . Hypomagnesemia 03/11/2020  . RLS (restless legs syndrome) 10/28/2019  . Pain of right hip joint 11/12/2017  . Unsteady gait 11/12/2017  . Fingernail abnormalities 11/11/2017  . Decreased visual acuity 01/25/2016  . Medicare annual wellness visit, subsequent 11/27/2014  . Constipation 11/22/2014  . Pulmonary edema 06/16/2012  . Intermittent  claudication ([place]) 05/22/2012  . Cough 05/18/2012  . Chronic diastolic heart failure ([place]) 05/12/2012  . OSA (obstructive sleep apnea) 04/24/2012  . LVH (left ventricular hypertrophy) 04/24/2012  . Hypertension   . Precancerous skin lesion   . Pelvic mass in female   . Debility 01/10/2011  . ATRIAL FIBRILLATION 08/14/2010  . URETHRAL CARUNCLE 04/05/2010  . Osteoarthritis 03/23/2010  . Hyperglycemia 01/10/2010  . GERD 01/10/2010  . Hyperthyroidism 02/09/2009  . Vitamin D deficiency 02/09/2009  . Hyperlipidemia 02/09/2009  . Obesity 02/09/2009  . Anemia, unspecified 02/09/2009  . DERMATITIS 02/09/2009  . Arthritis 02/09/2009  . URINARY FREQUENCY 02/09/2009  . UNS ADVRS EFF OTH RX MEDICINAL&BIOLOGICAL SBSTNC 02/09/2009    Palliative Care Assessment & Plan   HPI: 86 y.o. female  with past medical history of a fib on eliquis, HTN, HLD, OSA, hypothyroidism, OA, and RLS admitted on 03/11/2020 with weakness, R hip pain, and bilateral knee pain. Also complains of dysphagia.  Labs okay. Some pulmonary edema on initial chest x-ray - received one dose of lasix, on room air. She has refused surgical evaluation for MSK pain.  PMT consulted for [place] and symptom management.   Assessment: [name] is sleeping soundly. Appears comfortable. Discussed symptom management plan with daughter - she is happy with plan. Case management working with daughter for setup at home. Daughter has lots of questions about navigating transition home. She has my contact info for any further palliative concerns.   Recommendations/Plan:  GOALS: NO aggressive inventions, focus on quality of life and symptom management  Trial long acting ropinirole at night  Continue tylenol  Palliative to follow outpatient - patient interested in hospice services when eligible  Code Status:  DNR  Prognosis:   Unable to determine  Discharge Planning:  To Be Determined  Care plan was discussed with daughter Kennyth Lose, Dr.  Tyrell Antonio  Thank you for allowing the Palliative Medicine Team to assist in the care of this patient.   Total Time 15 minutes Prolonged Time Billed  no       Greater than 50%  of this time was spent counseling and coordinating care related to the above assessment and plan.   Juel Burrow, DNP, [facility] Palliative Medicine Team Team Phone # [phone]  Pager [phone]

## 2020-03-15 NOTE — Progress Notes (Signed)
    Durable Medical Equipment  (From admission, onward)         Start     Ordered   03/14/20 1642  For home use only DME Other see comment  Once    Comments: [place]ansport chair  Question:  Length of Need  Answer:  Lifetime   03/14/20 1642   03/13/20 1035  For home use only DME Bedside commode  Once    Question:  Patient needs a bedside commode to treat with the following condition  Answer:  Unsteady gait   03/13/20 1036   03/13/20 1034  For home use only DME Hospital bed  Once    Question Answer Comment  Length of Need Lifetime   The above medical condition requires: Patient requires the ability to reposition frequently   Kashani must be elevated greater than: 30 degrees   Bed type Semi-electric   [employer] Yes   Trapeze Bar Yes   Support Surface: Gel Overlay      03/13/20 1033   Unscheduled  For home use only DME lightweight manual wheelchair with seat cushion  Once    Comments: Patient suffers from weakness  which impairs their ability to perform daily activities like bathing, dressing, feeding, grooming and toileting in the home.  A cane, crutch or walker will not resolve  issue with performing activities of daily living. A transport chair will allow patient to safely perform daily activities. Patient is not able to propel themselves due to weakness but does have a caregiver willing and able to propel them. Length of need Lifetime.  TRANSPORT CHAIR   03/15/20 0940

## 2020-03-16 MED ORDER — TRAMADOL HCL 50 MG PO TABS: 50.0000 mg | ORAL_TABLET | Freq: Two times a day (BID) | ORAL | 0 refills | Status: DC | PRN

## 2020-03-16 MED ORDER — POLYETHYLENE GLYCOL 3350 17 G PO PACK: 17.0000 g | PACK | Freq: Every day | ORAL | 0 refills | Status: AC | PRN

## 2020-03-16 MED ORDER — ONDANSETRON HCL 4 MG PO TABS: 4.0000 mg | ORAL_TABLET | Freq: Four times a day (QID) | ORAL | 0 refills | Status: AC | PRN

## 2020-03-16 MED ORDER — ACETAMINOPHEN 500 MG PO TABS: 1000.0000 mg | ORAL_TABLET | Freq: Four times a day (QID) | ORAL | 0 refills | Status: DC

## 2020-03-16 MED ORDER — BISACODYL 10 MG RE SUPP: 10.0000 mg | Freq: Every day | RECTAL | Status: DC | PRN

## 2020-03-16 MED ORDER — NYSTATIN 100000 UNIT/GM EX POWD: 1.0000 "application " | Freq: Two times a day (BID) | CUTANEOUS | 0 refills | Status: AC

## 2020-03-16 MED ORDER — OXYCODONE HCL 5 MG PO TABS: 2.5000 mg | ORAL_TABLET | Freq: Three times a day (TID) | ORAL | 0 refills | Status: DC | PRN

## 2020-03-16 MED ORDER — DICLOFENAC SODIUM 1 % EX GEL: 2.0000 g | Freq: Two times a day (BID) | CUTANEOUS | 0 refills | Status: DC

## 2020-03-16 MED ORDER — SENNA 8.6 MG PO TABS: 2.0000 | ORAL_TABLET | Freq: Every day | ORAL | 0 refills | Status: DC

## 2020-03-16 MED ORDER — CYANOCOBALAMIN 100 MCG PO TABS: 100.0000 ug | ORAL_TABLET | Freq: Every day | ORAL | 0 refills | Status: AC

## 2020-03-16 MED ORDER — BISACODYL 10 MG RE SUPP: 10.0000 mg | Freq: Every day | RECTAL | 0 refills | Status: AC | PRN

## 2020-03-16 MED ORDER — TRAMADOL HCL 50 MG PO TABS: 50.0000 mg | ORAL_TABLET | Freq: Once | ORAL | Status: AC

## 2020-03-16 MED ORDER — ROPINIROLE HCL ER 4 MG PO TB24: 4.0000 mg | ORAL_TABLET | Freq: Every day | ORAL | 0 refills | Status: DC

## 2020-03-16 MED ORDER — FUROSEMIDE 20 MG PO TABS: 20.0000 mg | ORAL_TABLET | Freq: Every day | ORAL | 5 refills | Status: AC | PRN

## 2020-03-16 NOTE — Discharge Summary (Signed)
Physician Discharge Summary  Gina Reilly [id number]:[id number] DOB: [birth year] DOA: 03/11/2020  PCP: Mosie Lukes, MD  Admit date: 03/11/2020 Discharge date: 03/16/2020  Admitted From: Home  Disposition: Home with palliative care follow up/.   Recommendations for Outpatient Follow-up:  1. Patient to be discharge home with 24 hours care, Palliative care follow up at home as well.  2. Main goal of care is make sure patient is comfortable, pain management, quality of life.  3. Delirium Precaution: maintain day-night routine. Lights on during day, frequent orientation. No interruptions at night for good sleep. 4. Continue Patient on 3 L oxygen continue.  5. For pain management: tramadol 50 Mg BID PRN. Patient also has available low dose Oxycodone 2.5 mg every 8 Hours PRN.  6. For urinary incontinence, may use female external catheter on and off. Avoid continue use.  7. If patient further decline, family is interested in Hospice care.   Home Health: yes.  Equipment/Devices: Bedside commode, Hospital bed, [employer], Systems analyst. Light manual wheel chair.   Discharge Condition: Stable.  CODE STATUS; DNR Diet recommendation: Heart Healthy  Brief/Interim Summary: 86 year old with past medical history significant for chronic A. fib on Eliquis, hypertension, HLD, OSA, hypothyroidism, osteoarthritis and restless leg syndrome who presented with progressive generalized weakness, right hip and knee pain and dysphagia.  In the ED patient was noted to be in A. fib with RVR with heart rate in the 150.  Blood pressure was elevated.  Chest x-ray with mild cardiomegaly and mild edema.  Received IV Lasix x1 and admitted for generalized weakness, A. fib RVR and pain.  A. fib RVR has resolved.  Echocardiogram without significant findings.  Palliative care consulted and met with patient and family.  Goal is to make sure patient is comfortable.  Patient was a started on oxycodone 5 mg every 3 hours as needed  for severe pain.  She received 2 doses on 5/17 and had significant nausea and vomiting.  Patient was then started on a scheduled Tylenol, tramadol 50 mg twice daily as needed and oxycodone dose was reduced to 2.5 mg every 8 hours as needed.  She is doing better with current regimen. Patient Requip was change to extended release.   1-Generalized  weakness: Her A. fib could have play a role.  Medication as well like Cardizem. TSH within normal limits. Palliative care consulted, appreciate their assistance.  Main goal is to make sure that patient is comfortable. Continue with vitamin D and start vitamin B12.  2-Acute on  chronic pain/osteoarthritis, chronic: Right hip x-ray with osteoarthritis but no acute finding. Continue with Tylenol scheduled, tramadol 50 mg twice daily as needed Oxycodone low-dose 2.5 every 8 hours as needed. Patient denies pain this am., her pain is worse when she moves.   3-Persistent A. fib with RVR: Resolved continue with Cardizem and Eliquis  4-Abnormal chest x-ray some concern about pulmonary edema congestion: Received a dose of IV [place]. Oxygen for comfort. Negative 6 L without diuretics. Could use lasix PRN for comfort, worsening edema, dyspnea.  Oxygen 3 L for comfort.   Uncontrolled hypertension: Continue with Cardizem and losartan Hypomagnesemia: Resolved Mild elevation of troponin: likely demand ischemia related to A. fib RVR. Restless leg syndrome:  Requip changed to 4 mg extended release at bedtime Cutaneous candidiasis of the pannus and groin area: Continue with nystatin Macrocytosis; started  B 12 supplement.  Hyperkalemia; resolved.  Urinary incontinence, bed bound; May use  female external catheter as needed, avoid continue use  over  24 hours. Device to help prevent bedsore.   Discharge Diagnoses:  Principal Problem:   Generalized weakness Active Problems:   Obesity   ATRIAL FIBRILLATION   Osteoarthritis   Pulmonary edema   Candidiasis  of genitalia in female   Elevated troponin   Hypomagnesemia   Hypokalemia   Goals of care, counseling/discussion   Palliative care by specialist    Discharge Instructions  Discharge Instructions    Diet - low sodium heart healthy   Complete by: As directed      Allergies as of 03/16/2020      Reactions   Amiodarone Other (See Comments)   Amiodarone pulmonary toxicity.   Aspercreme [trolamine Salicylate] Other (See Comments)   Pt states it caused redness like it was burning her skin.      Medication List    STOP taking these medications   rOPINIRole 0.5 MG tablet Commonly known as: REQUIP Replaced by: rOPINIRole 4 MG 24 hr tablet     TAKE these medications   acetaminophen 500 MG tablet Commonly known as: TYLENOL Take 2 tablets (1,000 mg total) by mouth every 6 (six) hours. What changed:   when to take this  reasons to take this   bisacodyl 10 MG suppository Commonly known as: DULCOLAX Place 1 suppository (10 mg total) rectally daily as needed for moderate constipation.   cyanocobalamin 100 MCG tablet Take 1 tablet (100 mcg total) by mouth daily.   diclofenac Sodium 1 % Gel Commonly known as: VOLTAREN Apply 2 g topically 2 (two) times daily.   diltiazem 360 MG 24 hr capsule Commonly known as: CARDIZEM CD Take 1 capsule (360 mg total) by mouth daily. Pt needs to make appt with provider for further refills What changed: additional instructions   Eliquis 5 MG Tabs tablet Generic drug: apixaban TAKE 1 TABLET TWICE A DAY What changed: how much to take   furosemide 20 MG tablet Commonly known as: LASIX Take 1 tablet (20 mg total) by mouth daily as needed for fluid or edema. What changed: reasons to take this   losartan 25 MG tablet Commonly known as: COZAAR Take 1 tablet (25 mg total) by mouth daily. Pt needs to make appt with provider for further refills   nystatin powder Commonly known as: MYCOSTATIN/NYSTOP Apply 1 application topically 2 (two) times  daily.   ondansetron 4 MG tablet Commonly known as: ZOFRAN Take 1 tablet (4 mg total) by mouth every 6 (six) hours as needed for nausea.   oxyCODONE 5 MG immediate release tablet Commonly known as: Oxy IR/ROXICODONE Take 0.5 tablets (2.5 mg total) by mouth every 8 (eight) hours as needed for severe pain or breakthrough pain.   polyethylene glycol 17 g packet Commonly known as: MIRALAX / GLYCOLAX Take 17 g by mouth daily as needed for mild constipation.   rOPINIRole 4 MG 24 hr tablet Commonly known as: REQUIP XL Take 1 tablet (4 mg total) by mouth at bedtime. Replaces: rOPINIRole 0.5 MG tablet   senna 8.6 MG Tabs tablet Commonly known as: SENOKOT Take 2 tablets (17.2 mg total) by mouth at bedtime.   traMADol 50 MG tablet Commonly known as: ULTRAM Take 1 tablet (50 mg total) by mouth every 12 (twelve) hours as needed for moderate pain.   Vitamin D 50 MCG (2000 UT) Caps Take 2,000 Units by mouth daily.            Durable Medical Equipment  (From admission, onward)  Start     Ordered   03/15/20 1003  For home use only DME Other see comment  Once    Comments: Hoyer lift.  Question:  Length of Need  Answer:  6 Months   03/15/20 1003   03/15/20 0935  For home use only DME lightweight manual wheelchair with seat cushion  Once    Comments: Patient suffers from weakness  which impairs their ability to perform daily activities like bathing, dressing, feeding, grooming and toileting in the home.  A cane, crutch or walker will not resolve  issue with performing activities of daily living. A transport chair will allow patient to safely perform daily activities. Patient is not able to propel themselves due to weakness but does have a caregiver willing and able to propel them. Length of need Lifetime.  TRANSPORT CHAIR   03/15/20 0940   03/14/20 1642  For home use only DME Other see comment  Once    Comments: TRansport chair  Question:  Length of Need  Answer:  Lifetime    03/14/20 1642   03/13/20 1035  For home use only DME Bedside commode  Once    Question:  Patient needs a bedside commode to treat with the following condition  Answer:  Unsteady gait   03/13/20 1036   03/13/20 1034  For home use only DME Hospital bed  Once    Question Answer Comment  Length of Need Lifetime   The above medical condition requires: Patient requires the ability to reposition frequently   [place] must be elevated greater than: 30 degrees   Bed type Semi-electric   [employer] Yes   Trapeze Bar Yes   Support Surface: Gel Overlay      03/13/20 1033         Follow-up Information    [facility] Follow up.   Why: transport chair, bedside commode,hoyer lift, these will be delivered to patient's home Contact information: [address] [place] [address] [phone]        Mosie Lukes, MD On 03/30/2020.   Specialty: Family Medicine Why: @3pm  Contact information: [address] [place] [address] [phone]          Allergies  Allergen Reactions  . Amiodarone Other (See Comments)    Amiodarone pulmonary toxicity.  . Aspercreme [Trolamine Salicylate] Other (See Comments)    Pt states it caused redness like it was burning her skin.    Consultations:  Palliative care   Procedures/Studies: DG Pelvis 1-2 Views  Result Date: 03/11/2020 CLINICAL DATA:  Right hip pain EXAM: PELVIS - 1-VIEW COMPARISON:  06/08/2018 FINDINGS: Pelvic ring is intact. Left hip prosthesis is again noted. Progressive degenerative changes of the right hip are noted with increased sclerosis and subchondral cyst formation. No acute fracture is seen. No soft tissue abnormality is noted. IMPRESSION: Progressive degenerative changes on the right. Electronically Signed   By: Inez Catalina M.D.   On: 03/11/2020 20:02   DG Chest Port 1 View  Result Date: 03/11/2020 CLINICAL DATA:  Weakness EXAM: PORTABLE CHEST 1 VIEW COMPARISON:  11/11/2017 FINDINGS: Mild  cardiomegaly. Atherosclerotic calcification of the aortic knob. Mildly prominent perihilar and bibasilar interstitial markings. No lobar consolidation. No pleural effusion or pneumothorax. Degenerative changes of the bilateral shoulders. IMPRESSION: 1. Mildly prominent perihilar and bibasilar interstitial markings, could reflect mild edema or atypical/viral infection. 2. Mild cardiomegaly. Electronically Signed   By: Davina Poke D.O.   On: 03/11/2020 13:04  ECHOCARDIOGRAM LIMITED  Result Date: 03/13/2020    ECHOCARDIOGRAM LIMITED REPORT   Patient Name:   MARNIE FAZZINO Bewley Date of Exam: 03/13/2020 Medical Rec #:  [id number]    Height:       68.0 in Accession #:    [id number]   Weight:       250.4 lb Date of Birth:  01/07/1924    BSA:          2.249 m Patient Age:    72 years     BP:           137/90 mmHg Patient Gender: F            HR:           102 bpm. Exam Location:  Inpatient Procedure: Limited Echo, Color Doppler, Cardiac Doppler and Intracardiac            Opacification Agent Indications:    Atrial Fibrillation 427.31 / I48.91  History:        Patient has prior history of Echocardiogram examinations, most                 recent 08/22/2015. LVH; Risk Factors:Elevated troponin, Sleep                 Apnea and Hypertension.  Sonographer:    Vikki Ports Turrentine Referring Phys: [id number] Mer[facility] Riding  Sonographer Comments: Technically difficult study due to patient positioning and extreme sensitivity to probe in all windows. IMPRESSIONS  1. Left ventricular ejection fraction, by estimation, is 55 to 60%. The left ventricle has normal function. Left ventricular diastolic function could not be evaluated.  2. Right ventricular systolic function was not well visualized. The right ventricular size is moderately enlarged.  3. Left atrial size was mildly dilated.  4. The mitral valve is grossly normal. Trivial mitral valve regurgitation. No evidence of mitral stenosis.  5. The aortic valve is tricuspid. Aortic valve  regurgitation is not visualized. Mild aortic valve sclerosis is present, with no evidence of aortic valve stenosis. Comparison(s): No significant change from prior study. FINDINGS  Left Ventricle: Left ventricular ejection fraction, by estimation, is 55 to 60%. The left ventricle has normal function. Definity contrast agent was given IV to delineate the left ventricular endocardial borders. The left ventricular internal cavity size was normal in size. There is no left ventricular hypertrophy. Left ventricular diastolic function could not be evaluated due to atrial fibrillation. Right Ventricle: The right ventricular size is moderately enlarged. Right vetricular wall thickness was not assessed. Right ventricular systolic function was not well visualized. Left Atrium: Left atrial size was mildly dilated. Right Atrium: Right atrial size was not well visualized. Pericardium: There is no evidence of pericardial effusion. Mitral Valve: The mitral valve is grossly normal. Moderate to severe mitral annular calcification. Trivial mitral valve regurgitation. No evidence of mitral valve stenosis. Tricuspid Valve: The tricuspid valve is normal in structure. Tricuspid valve regurgitation is trivial. No evidence of tricuspid stenosis. Aortic Valve: The aortic valve is tricuspid. . There is mild thickening and mild calcification of the aortic valve. Aortic valve regurgitation is not visualized. Mild aortic valve sclerosis is present, with no evidence of aortic valve stenosis. There is mild thickening of the aortic valve. There is mild calcification of the aortic valve. Pulmonic Valve: The pulmonic valve was not well visualized. Pulmonic valve regurgitation is not visualized. No evidence of pulmonic stenosis. IAS/Shunts: The interatrial septum was not assessed.  LEFT VENTRICLE PLAX 2D LVIDd:  4.20 cm LVIDs:         3.10 cm LV PW:         0.90 cm LV IVS:        0.90 cm LVOT diam:     2.00 cm LVOT Area:     3.14 cm  LEFT  ATRIUM           Index LA diam:      3.90 cm 1.73 cm/m LA Vol (A4C): 67.1 ml 29.84 ml/m   AORTA Ao Root diam: 3.10 cm MITRAL VALVE                TRICUSPID VALVE MV Area (PHT): 3.77 cm     TR Peak grad:   16.0 mmHg MV Decel Time: 201 msec     TR Vmax:        200.00 cm/s MV E velocity: 116.33 cm/s                             SHUNTS                             Systemic Diam: 2.00 cm Buford Dresser MD Electronically signed by Buford Dresser MD Signature Date/Time: 03/13/2020/9:51:21 PM    Final      Subjective: Sleepy, wake up, open  eyes to voice. Answer few questions. Denies dyspnea, or pain.    Discharge Exam: Vitals:   03/16/20 0400 03/16/20 0839  BP: (!) 144/89 (!) 156/82  Pulse: 67 72  Resp: 16 18  Temp: 97.6 F (36.4 C) (!) 97.5 F (36.4 C)  SpO2: 98% 99%     General: Pt is alert, awake, not in acute distress Cardiovascular: RRR, S1/S2 +, no rubs, no gallops Respiratory: CTA bilaterally, no wheezing, no rhonchi Abdominal: Soft, NT, ND, bowel sounds + Extremities: no edema, no cyanosis    The results of significant diagnostics from this hospitalization (including imaging, microbiology, ancillary and laboratory) are listed below for reference.     Microbiology: No results found for this or any previous visit (from the past 240 hour(s)).   Labs: BNP (last 3 results) Recent Labs    03/11/20 1458  BNP 07.1   Basic Metabolic Panel: Recent Labs  Lab 03/11/20 1023 03/11/20 1229 03/11/20 1458 03/12/20 0332 03/13/20 0506 03/15/20 0410 03/15/20 0736  NA 136  --   --  136 133*  --  136  K 6.3* 4.0  --  3.3* 4.6  --  4.7  CL 102  --   --  103 102  --  102  CO2 25  --   --  24 24  --  25  GLUCOSE 135*  --   --  125* 164*  --  129*  BUN 17  --   --  11 16  --  11  CREATININE 0.93  --   --  0.84 0.85  --  0.72  CALCIUM 9.6  --   --  8.9 9.1  --  9.3  MG  --   --  1.6* 1.9 1.9 2.0  --   PHOS  --   --   --   --  4.1  --  4.2   Liver Function  Tests: Recent Labs  Lab 03/12/20 0332 03/13/20 0506 03/15/20 0736  AST 20  --   --   ALT 18  --   --  ALKPHOS 46  --   --   BILITOT 1.1  --   --   PROT 5.8*  --   --   ALBUMIN 3.3* 3.4* 3.4*   No results for input(s): LIPASE, AMYLASE in the last 168 hours. No results for input(s): AMMONIA in the last 168 hours. CBC: Recent Labs  Lab 03/11/20 1023 03/12/20 0332 03/15/20 0410  WBC 8.0 7.5 8.1  HGB 13.8 12.3 13.1  HCT 43.5 38.7 41.6  MCV 106.1* 103.5* 107.8*  PLT 184 180 211   Cardiac Enzymes: Recent Labs  Lab 03/15/20 0410  CKTOTAL 133   BNP: Invalid input(s): POCBNP CBG: Recent Labs  Lab 03/11/20 1117  GLUCAP 130*   D-Dimer No results for input(s): DDIMER in the last 72 hours. Hgb A1c No results for input(s): HGBA1C in the last 72 hours. Lipid Profile No results for input(s): CHOL, HDL, LDLCALC, TRIG, CHOLHDL, LDLDIRECT in the last 72 hours. Thyroid function studies No results for input(s): TSH, T4TOTAL, T3FREE, THYROIDAB in the last 72 hours.  Invalid input(s): FREET3 Anemia work up No results for input(s): VITAMINB12, FOLATE, FERRITIN, TIBC, IRON, RETICCTPCT in the last 72 hours. Urinalysis    Component Value Date/Time   COLORURINE YELLOW 03/11/2020 1229   APPEARANCEUR HAZY (A) 03/11/2020 1229   LABSPEC 1.012 03/11/2020 1229   PHURINE 5.0 03/11/2020 1229   GLUCOSEU NEGATIVE 03/11/2020 1229   GLUCOSEU NEGATIVE 10/14/2016 1355   HGBUR NEGATIVE 03/11/2020 1229   HGBUR negative 04/05/2010 1025   BILIRUBINUR NEGATIVE 03/11/2020 1229   BILIRUBINUR neg 12/25/2012 1432   KETONESUR NEGATIVE 03/11/2020 1229   PROTEINUR 100 (A) 03/11/2020 1229   UROBILINOGEN 0.2 10/14/2016 1355   NITRITE NEGATIVE 03/11/2020 1229   LEUKOCYTESUR NEGATIVE 03/11/2020 1229   Sepsis Labs Invalid input(s): PROCALCITONIN,  WBC,  LACTICIDVEN Microbiology No results found for this or any previous visit (from the past 240 hour(s)).   Time coordinating discharge: 40  minutes  SIGNED:   Elmarie Shiley, MD  [facility]

## 2020-03-16 NOTE — Care Management Important Message (Signed)
Important Message  Patient Details  Name: DILLYNN SCHULLO MRN: [id number] Date of Birth: 12-18-1923   Medicare Important Message Given:  Yes     Shelda Altes 03/16/2020, 12:24 PM

## 2020-03-16 NOTE — Plan of Care (Signed)

## 2020-03-16 NOTE — TOC Transition Note (Addendum)
Transition of Care [facility]) - CM/SW Discharge Note   Patient Details  Name: Gina Reilly MRN: [id number] Date of Birth: 11/01/1923  Transition of Care [facility]) CM/SW Contact:  Zenon Mayo, RN Phone Number: 03/16/2020, 9:18 AM   Clinical Narrative:    Patient for dc today, NCM spoke with daughter Kennyth Lose this am, she would like [facility] transport to be set up for 2 pm , to get as close to 4:30 as possible, since that is what time the private duty person is coming over.  NCM contacted Christa See  with [facility] palliative to let them know patient is for dc today. Patient is on 3 liters of oxygen , she has oxygen at home per daughter.  They will have private duty care with [employer].  [employer] will be delivering bsc, transport chair and hoyer lift to the home.    Final next level of care: [place] Barriers to Discharge: No Barriers Identified   Patient Goals and CMS Choice Patient states their goals for this hospitalization and ongoing recovery are:: would like to return home CMS Medicare.gov Compare Post Acute Care list provided to:: Patient Represenative (must comment) Choice offered to / list presented to : Adult Children  Discharge Placement                       Discharge Plan and Services   Discharge Planning Services: CM Consult Post Acute Care Choice: Durable Medical Equipment, Home Health          DME Arranged: Bedside commode(transport chair, hoyer lift) DME Agency: [employer] Date DME Agency Contacted: 03/14/20 Time DME Agency Contacted: 1300 Representative spoke with at DME Agency: [place]: (private duty with Product/process development scientist)          Social Determinants of Health ([place]) Interventions     Readmission Risk Interventions No flowsheet data found.

## 2020-03-17 ENCOUNTER — Encounter: Payer: Self-pay | Admitting: Family Medicine

## 2020-03-17 DIAGNOSIS — I10 Essential (primary) hypertension: Secondary | ICD-10-CM | POA: Diagnosis not present

## 2020-03-17 DIAGNOSIS — G9009 Other idiopathic peripheral autonomic neuropathy: Secondary | ICD-10-CM | POA: Diagnosis not present

## 2020-03-17 DIAGNOSIS — D649 Anemia, unspecified: Secondary | ICD-10-CM | POA: Diagnosis not present

## 2020-03-17 DIAGNOSIS — Z515 Encounter for palliative care: Secondary | ICD-10-CM | POA: Diagnosis not present

## 2020-03-17 DIAGNOSIS — E785 Hyperlipidemia, unspecified: Secondary | ICD-10-CM | POA: Diagnosis not present

## 2020-03-17 DIAGNOSIS — I4891 Unspecified atrial fibrillation: Secondary | ICD-10-CM | POA: Diagnosis not present

## 2020-03-17 DIAGNOSIS — M1990 Unspecified osteoarthritis, unspecified site: Secondary | ICD-10-CM | POA: Diagnosis not present

## 2020-03-17 DIAGNOSIS — I110 Hypertensive heart disease with heart failure: Secondary | ICD-10-CM | POA: Diagnosis not present

## 2020-03-17 DIAGNOSIS — E039 Hypothyroidism, unspecified: Secondary | ICD-10-CM | POA: Diagnosis not present

## 2020-03-17 NOTE — Telephone Encounter (Signed)
Placed in folder to be signed

## 2020-03-20 ENCOUNTER — Telehealth: Payer: Self-pay

## 2020-03-20 DIAGNOSIS — E785 Hyperlipidemia, unspecified: Secondary | ICD-10-CM | POA: Diagnosis not present

## 2020-03-20 DIAGNOSIS — I110 Hypertensive heart disease with heart failure: Secondary | ICD-10-CM | POA: Diagnosis not present

## 2020-03-20 DIAGNOSIS — I10 Essential (primary) hypertension: Secondary | ICD-10-CM | POA: Diagnosis not present

## 2020-03-20 DIAGNOSIS — G9009 Other idiopathic peripheral autonomic neuropathy: Secondary | ICD-10-CM | POA: Diagnosis not present

## 2020-03-20 DIAGNOSIS — E039 Hypothyroidism, unspecified: Secondary | ICD-10-CM | POA: Diagnosis not present

## 2020-03-20 DIAGNOSIS — D649 Anemia, unspecified: Secondary | ICD-10-CM | POA: Diagnosis not present

## 2020-03-20 NOTE — Telephone Encounter (Signed)
[place] Name [place] Name [place] Number [phone] Patient Name Gina Reilly Patient DOB 05/04/1924 Reason for Call Request to speak to Physician Initial Comment Caller states he needs an order for hospice evaluation

## 2020-03-21 NOTE — Telephone Encounter (Signed)
Please give them a VO for hospice evaluation and find out if they still need to speak with me

## 2020-03-22 NOTE — Telephone Encounter (Signed)
Spoke with Georgina Peer and she stated that she spoke with someone in the office on Monday and she has been admitted to hospice.

## 2020-03-23 DIAGNOSIS — G9009 Other idiopathic peripheral autonomic neuropathy: Secondary | ICD-10-CM | POA: Diagnosis not present

## 2020-03-23 DIAGNOSIS — I110 Hypertensive heart disease with heart failure: Secondary | ICD-10-CM | POA: Diagnosis not present

## 2020-03-23 DIAGNOSIS — E039 Hypothyroidism, unspecified: Secondary | ICD-10-CM | POA: Diagnosis not present

## 2020-03-23 DIAGNOSIS — D649 Anemia, unspecified: Secondary | ICD-10-CM | POA: Diagnosis not present

## 2020-03-23 DIAGNOSIS — E785 Hyperlipidemia, unspecified: Secondary | ICD-10-CM | POA: Diagnosis not present

## 2020-03-23 DIAGNOSIS — I10 Essential (primary) hypertension: Secondary | ICD-10-CM | POA: Diagnosis not present

## 2020-03-30 ENCOUNTER — Telehealth (INDEPENDENT_AMBULATORY_CARE_PROVIDER_SITE_OTHER): Admitting: Family Medicine

## 2020-03-30 VITALS — BP 131/76 | HR 86 | Temp 97.2°F | Resp 18

## 2020-03-30 DIAGNOSIS — I4811 Longstanding persistent atrial fibrillation: Secondary | ICD-10-CM

## 2020-03-30 DIAGNOSIS — R739 Hyperglycemia, unspecified: Secondary | ICD-10-CM

## 2020-03-30 DIAGNOSIS — Z515 Encounter for palliative care: Secondary | ICD-10-CM | POA: Diagnosis not present

## 2020-03-30 DIAGNOSIS — R5381 Other malaise: Secondary | ICD-10-CM | POA: Diagnosis not present

## 2020-03-30 DIAGNOSIS — Z7189 Other specified counseling: Secondary | ICD-10-CM

## 2020-03-30 DIAGNOSIS — R059 Cough, unspecified: Secondary | ICD-10-CM

## 2020-03-30 DIAGNOSIS — R05 Cough: Secondary | ICD-10-CM

## 2020-03-30 DIAGNOSIS — R2681 Unsteadiness on feet: Secondary | ICD-10-CM

## 2020-03-30 DIAGNOSIS — I10 Essential (primary) hypertension: Secondary | ICD-10-CM

## 2020-03-30 DIAGNOSIS — R531 Weakness: Secondary | ICD-10-CM | POA: Diagnosis not present

## 2020-04-02 NOTE — Assessment & Plan Note (Signed)
Follows with [facility] and this PMD has agreed to stay on as primary provider during her care. They are also bringing in aides so she has 24/7 care. Her strength is improving since her recent hospitalization and she is eating better as well. Will set up a PT/OT evaluation to work on fall risk and conditioning. Spent 45 minutes with the patient and her daughter as we developed care plan

## 2020-04-02 NOTE — Assessment & Plan Note (Addendum)
Set up PT/OT, she notes right hip/knee/leg pain with movement.

## 2020-04-02 NOTE — Assessment & Plan Note (Signed)
Monitor and report any concerns 

## 2020-04-02 NOTE — Progress Notes (Addendum)
Virtual Visit via Video Note  I connected with Gina Reilly on 03/30/20 at  3:00 PM EDT by a video enabled telemedicine application and verified that I am speaking with the correct person using two identifiers.  Location: Patient: home Provider: office, patient, her daughter and provider were in visit.    I discussed the limitations of evaluation and management by telemedicine and the availability of in person appointments. The patient expressed understanding and agreed to proceed. Kem Boroughs, CMA was able to set up patient on a video visit.     Subjective:    Patient ID: Gina Reilly, female    DOB: 1924/03/25, 86 y.o.   MRN: [id number]  No chief complaint on file.   HPI Patient is in today for follow up on chronic medical concerns and to discuss her recent hospitalization. She had an inpatient Palliative care consult and she is now back home with [facility] palliative care services. She is receiving care from [facility] caregivers and her daughters so she has 24/7 care her daughter Kennyth Lose is with her now and her daughter Helene Kelp will take over next. She continues to struggle with right leg pain with movement. No recent fall or trauma. Denies CP/palp/SOB/HA/congestion/fevers/GI or GU c/o. Taking meds as prescribed. She is eating better and feeling stronger since she got home.   Past Medical History:  Diagnosis Date  . 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 Anemia   . Apnea, sleep 04/24/2012  . Arthritis    osteo  . Bronchitis 12/25/2011  . Chicken pox as a child  . Chronic atrial fibrillation ([place])    a. 07/2010 TEE/DCCV:  EF 60-65% mild MR.; b. Recurrent Afib--> Amiodarone;  Pradaxa (CHA2DS2VASc = 4);  c. Amio d/c'd 2/2 toxicity - now chronic rate-controlled afib.  . Constipation 11/22/2014  . Contusion of leg, left 01/16/2012  . GERD (gastroesophageal reflux disease)    . History of echocardiogram    a. 07/2015 Echo: EF 60-65%, Ao sclerosis, mild MR, mildly dil LA/RV/RA, mild Tr, PASP 48mmHg.  Marland Kitchen Hyperglycemia 01/10/2010   Qualifier: Diagnosis of  By: Niel Hummer MD, [place] Hyperlipidemia   . Hypertension   . Hyperthyroidism 02/09/2009   Qualifier: Diagnosis of  By: Niel Hummer MD, [place] Hypoxia 04/14/2012  . Intermittent claudication ([place]) 05/22/2012  . Measles as a child  . Medicare annual wellness visit, subsequent 11/27/2014   Follows with pulmonology, Dr Gwenette Greet Follow with cardiology, Dr Rance Muir with Dr Matthew Saras Does not participate in [facility], pap or colonoscopy screening at this time.  . Mumps as a child  . Obese   . Ovarian cyst   . Pelvic mass in female   . Peripheral neuropathy 02/26/2012  . Precancerous skin lesion   . Skin cancer    left temple  . Thyroid disease    hypothroidism  . Vitamin D deficiency     Past Surgical History:  Procedure Laterality Date  . ABDOMINAL HYSTERECTOMY    . CATARACT EXTRACTION     Dr. Satira Sark  . SKIN BIOPSY     left temple. cancerous  . TOTAL HIP ARTHROPLASTY  2003   left    Family History  Problem Relation Age of Onset  . Stroke Mother   . Prostate cancer Father        prostate  . Hypertension Brother   .  Cancer Maternal Grandmother   . Hypertension Brother   . Heart disease Brother   . Obesity Brother   . Hypertension Brother   . Hyperlipidemia Brother   . Heart disease Brother   . Kidney disease Brother   . Cancer Daughter        kidney- spine and blood stream  . Heart disease Son        valvular heart disease  . Prostate cancer Brother     Social History   Socioeconomic History  . Marital status: Widowed    Spouse name: Not on file  . Number of children: 25  . Years of education: Not on file  . Highest education level: Not on file  Occupational History  . Occupation: retired    Fish farm manager: RETIRED  Tobacco Use  . Smoking status: Former Smoker    Packs/day:  0.30    Years: 5.00    Pack years: 1.50    Types: Cigarettes    Quit date: 10/28/1973    Years since quitting: 46.4  . Smokeless tobacco: Never Used  Substance and Sexual Activity  . Alcohol use: Yes    Comment: socially  . Drug use: No  . Sexual activity: Not on file  Other Topics Concern  . Not on file  Social History Narrative  . Not on file   Social Determinants of Health   Financial Resource Strain:   . Difficulty of Paying Living Expenses:   Food Insecurity:   . Worried About Charity fundraiser in the Last Year:   . Arboriculturist in the Last Year:   Transportation Needs:   . Film/video editor (Medical):   Marland Kitchen Lack of Transportation (Non-Medical):   Physical Activity:   . Days of Exercise per Week:   . Minutes of Exercise per Session:   Stress:   . Feeling of Stress :   Social Connections:   . Frequency of Communication with Friends and Family:   . Frequency of Social Gatherings with Friends and Family:   . Attends Religious Services:   . Active Member of Clubs or Organizations:   . Attends Archivist Meetings:   Marland Kitchen Marital Status:   Intimate Partner Violence:   . Fear of Current or Ex-Partner:   . Emotionally Abused:   Marland Kitchen Physically Abused:   . Sexually Abused:     Outpatient Medications Prior to Visit  Medication Sig Dispense Refill  . acetaminophen (TYLENOL) 650 MG CR tablet Take 650 mg by mouth every 6 (six) hours as needed for pain.    . bisacodyl (DULCOLAX) 10 MG suppository Place 1 suppository (10 mg total) rectally daily as needed for moderate constipation. 12 suppository 0  . Cholecalciferol (VITAMIN D) 50 MCG (2000 UT) CAPS Take 2,000 Units by mouth daily.    . diclofenac Sodium (VOLTAREN) 1 % GEL Apply 2 g topically 2 (two) times daily. 350 g 0  . diltiazem (CARDIZEM CD) 360 MG 24 hr capsule Take 1 capsule (360 mg total) by mouth daily. Pt needs to make appt with provider for further refills (Patient taking differently: Take 360 mg by  mouth daily. ) 90 capsule 0  . ELIQUIS 5 MG TABS tablet TAKE 1 TABLET TWICE A DAY (Patient taking differently: Take 5 mg by mouth 2 (two) times daily. ) 180 tablet 3  . furosemide (LASIX) 20 MG tablet Take 1 tablet (20 mg total) by mouth daily as needed for fluid or edema. 30 tablet 5  .  losartan (COZAAR) 25 MG tablet Take 1 tablet (25 mg total) by mouth daily. Pt needs to make appt with provider for further refills 90 tablet 0  . nystatin (MYCOSTATIN/NYSTOP) powder Apply 1 application topically 2 (two) times daily. 15 g 0  . ondansetron (ZOFRAN) 4 MG tablet Take 1 tablet (4 mg total) by mouth every 6 (six) hours as needed for nausea. 20 tablet 0  . polyethylene glycol (MIRALAX / GLYCOLAX) 17 g packet Take 17 g by mouth daily as needed for mild constipation. 14 each 0  . rOPINIRole (REQUIP XL) 4 MG 24 hr tablet Take 1 tablet (4 mg total) by mouth at bedtime. 30 tablet 0  . senna (SENOKOT) 8.6 MG TABS tablet Take 2 tablets (17.2 mg total) by mouth at bedtime. 120 tablet 0  . traMADol (ULTRAM) 50 MG tablet Take 1 tablet (50 mg total) by mouth every 12 (twelve) hours as needed for moderate pain. (Patient taking differently: Take 50 mg by mouth every 8 (eight) hours as needed for moderate pain. ) 30 tablet 0  . vitamin B-12 100 MCG tablet Take 1 tablet (100 mcg total) by mouth daily. 30 tablet 0  . acetaminophen (TYLENOL) 500 MG tablet Take 2 tablets (1,000 mg total) by mouth every 6 (six) hours. 30 tablet 0  . oxyCODONE (OXY IR/ROXICODONE) 5 MG immediate release tablet Take 0.5 tablets (2.5 mg total) by mouth every 8 (eight) hours as needed for severe pain or breakthrough pain. 30 tablet 0   No facility-administered medications prior to visit.    Allergies  Allergen Reactions  . Amiodarone Other (See Comments)    Amiodarone pulmonary toxicity.  . Aspercreme [Trolamine Salicylate] Other (See Comments)    Pt states it caused redness like it was burning her skin.    Review of Systems    Constitutional: Positive for malaise/fatigue. Negative for fever.  HENT: Negative for congestion.   Eyes: Negative for blurred vision.  Respiratory: Negative for shortness of breath.   Cardiovascular: Negative for chest pain, palpitations and leg swelling.  Gastrointestinal: Negative for abdominal pain, blood in stool and nausea.  Genitourinary: Negative for dysuria and frequency.  Musculoskeletal: Positive for joint pain and myalgias. Negative for falls.  Skin: Negative for rash.  Neurological: Positive for weakness. Negative for dizziness, loss of consciousness and headaches.  Endo/Heme/Allergies: Negative for environmental allergies.  Psychiatric/Behavioral: Negative for depression. The patient is not nervous/anxious.        Objective:    Physical Exam Constitutional:      Appearance: Normal appearance. She is not ill-appearing.  HENT:     Iribe: Normocephalic and atraumatic.     Right Ear: External ear normal.     Left Ear: External ear normal.     Nose: Nose normal.  Pulmonary:     Effort: Pulmonary effort is normal.  Neurological:     Mental Status: She is alert and oriented to person, place, and time.  Psychiatric:        Behavior: Behavior normal.     BP 131/76   Pulse 86   Temp (!) 97.2 F (36.2 C)   Resp 18   LMP 12/25/2011   SpO2 93%  Wt Readings from Last 3 Encounters:  03/16/20 248 lb (112.5 kg)  12/02/19 230 lb (104.3 kg)  10/28/19 230 lb (104.3 kg)    Diabetic Foot Exam - Simple   No data filed     Lab Results  Component Value Date   WBC 8.1 03/15/2020  HGB 13.1 03/15/2020   HCT 41.6 03/15/2020   PLT 211 03/15/2020   GLUCOSE 129 (H) 03/15/2020   CHOL 151 12/02/2019   TRIG 128.0 12/02/2019   HDL 61.30 12/02/2019   LDLDIRECT 128.4 02/09/2009   LDLCALC 64 12/02/2019   ALT 18 03/12/2020   AST 20 03/12/2020   NA 136 03/15/2020   K 4.7 03/15/2020   CL 102 03/15/2020   CREATININE 0.72 03/15/2020   BUN 11 03/15/2020   CO2 25 03/15/2020    TSH 1.991 03/11/2020   INR 1.32 08/17/2010   HGBA1C 6.2 (H) 03/13/2020    Lab Results  Component Value Date   TSH 1.991 03/11/2020   Lab Results  Component Value Date   WBC 8.1 03/15/2020   HGB 13.1 03/15/2020   HCT 41.6 03/15/2020   MCV 107.8 (H) 03/15/2020   PLT 211 03/15/2020   Lab Results  Component Value Date   NA 136 03/15/2020   K 4.7 03/15/2020   CO2 25 03/15/2020   GLUCOSE 129 (H) 03/15/2020   BUN 11 03/15/2020   CREATININE 0.72 03/15/2020   BILITOT 1.1 03/12/2020   ALKPHOS 46 03/12/2020   AST 20 03/12/2020   ALT 18 03/12/2020   PROT 5.8 (L) 03/12/2020   ALBUMIN 3.4 (L) 03/15/2020   CALCIUM 9.3 03/15/2020   ANIONGAP 9 03/15/2020   GFR 63.14 12/02/2019   Lab Results  Component Value Date   CHOL 151 12/02/2019   Lab Results  Component Value Date   HDL 61.30 12/02/2019   Lab Results  Component Value Date   LDLCALC 64 12/02/2019   Lab Results  Component Value Date   TRIG 128.0 12/02/2019   Lab Results  Component Value Date   CHOLHDL 2 12/02/2019   Lab Results  Component Value Date   HGBA1C 6.2 (H) 03/13/2020       Assessment & Plan:   Problem List Items Addressed This Visit    Hyperglycemia    hgba1c acceptable, minimize simple carbs. Increase exercise as tolerated.       ATRIAL FIBRILLATION   Relevant Orders   Ambulatory referral to [place]    Set up PT/OT, she notes right hip/knee/leg pain with movement.       Hypertension    Monitor and report any concerns.      Cough   Unsteady gait - Primary   Relevant Orders   Ambulatory referral to [place]   Generalized weakness   Relevant Orders   Ambulatory referral to [place] of care, counseling/discussion   Palliative care by specialist    Follows with [facility] and this PMD has agreed to stay on as primary provider during her care. They are also bringing in aides so she has 24/7 care. Her strength is improving since her recent hospitalization and she  is eating better as well. Will set up a PT/OT evaluation to work on fall risk and conditioning. Spent 45 minutes with the patient and her daughter as we developed care plan         I have discontinued Zakya N. Grunden's oxyCODONE. I am also having her maintain her Eliquis, losartan, diltiazem, Vitamin D, furosemide, rOPINIRole, bisacodyl, diclofenac Sodium, nystatin, ondansetron, polyethylene glycol, senna, traMADol, cyanocobalamin, and acetaminophen.  No orders of the defined types were placed in this encounter.    Penni Homans, MD   I discussed the assessment and treatment plan with the patient. The patient was provided an opportunity to ask  questions and all were answered. The patient agreed with the plan and demonstrated an understanding of the instructions.   The patient was advised to call back or seek an in-person evaluation if the symptoms worsen or if the condition fails to improve as anticipated.  I provided 45 minutes of non-face-to-face time during this encounter.   Penni Homans, MD

## 2020-04-02 NOTE — Assessment & Plan Note (Signed)
hgba1c acceptable, minimize simple carbs. Increase exercise as tolerated.  

## 2020-04-05 ENCOUNTER — Encounter: Payer: Self-pay | Admitting: Family Medicine

## 2020-04-21 ENCOUNTER — Ambulatory Visit: Admitting: *Deleted

## 2020-04-22 ENCOUNTER — Other Ambulatory Visit: Payer: Self-pay | Admitting: Family Medicine

## 2020-04-24 NOTE — Telephone Encounter (Signed)
Looks like patient was in the hospital 03/11/20 and she was changed to Requip XL 4mg .  Do you want to continue on the 4mg  or change back.  It looks like they only gave her a 30 day supply and that she probably went back on this original dose of Requip 0.5mg .

## 2020-04-27 DIAGNOSIS — I110 Hypertensive heart disease with heart failure: Secondary | ICD-10-CM | POA: Diagnosis not present

## 2020-04-27 DIAGNOSIS — I10 Essential (primary) hypertension: Secondary | ICD-10-CM | POA: Diagnosis not present

## 2020-04-27 DIAGNOSIS — G9009 Other idiopathic peripheral autonomic neuropathy: Secondary | ICD-10-CM | POA: Diagnosis not present

## 2020-04-27 DIAGNOSIS — M1990 Unspecified osteoarthritis, unspecified site: Secondary | ICD-10-CM | POA: Diagnosis not present

## 2020-04-27 DIAGNOSIS — D649 Anemia, unspecified: Secondary | ICD-10-CM | POA: Diagnosis not present

## 2020-04-27 DIAGNOSIS — E039 Hypothyroidism, unspecified: Secondary | ICD-10-CM | POA: Diagnosis not present

## 2020-04-27 DIAGNOSIS — Z515 Encounter for palliative care: Secondary | ICD-10-CM | POA: Diagnosis not present

## 2020-04-27 DIAGNOSIS — I4891 Unspecified atrial fibrillation: Secondary | ICD-10-CM | POA: Diagnosis not present

## 2020-04-27 DIAGNOSIS — E785 Hyperlipidemia, unspecified: Secondary | ICD-10-CM | POA: Diagnosis not present

## 2020-04-28 ENCOUNTER — Telehealth: Payer: Self-pay | Admitting: Family Medicine

## 2020-04-28 NOTE — Telephone Encounter (Signed)
rOPINIRole (REQUIP) 0.5 MG tablet [[id number]] rOPINIRole (REQUIP XL) 4 MG 24 hr tablet [[id number]]    [facility] both prescription, should they fill both   Please advise

## 2020-04-28 NOTE — Telephone Encounter (Signed)
[facility] stated that they called patient and she stated that she is on both mgs and medication has been sent out to patient.

## 2020-05-04 DIAGNOSIS — E039 Hypothyroidism, unspecified: Secondary | ICD-10-CM | POA: Diagnosis not present

## 2020-05-04 DIAGNOSIS — I10 Essential (primary) hypertension: Secondary | ICD-10-CM | POA: Diagnosis not present

## 2020-05-04 DIAGNOSIS — I110 Hypertensive heart disease with heart failure: Secondary | ICD-10-CM | POA: Diagnosis not present

## 2020-05-04 DIAGNOSIS — G9009 Other idiopathic peripheral autonomic neuropathy: Secondary | ICD-10-CM | POA: Diagnosis not present

## 2020-05-04 DIAGNOSIS — E785 Hyperlipidemia, unspecified: Secondary | ICD-10-CM | POA: Diagnosis not present

## 2020-05-04 DIAGNOSIS — D649 Anemia, unspecified: Secondary | ICD-10-CM | POA: Diagnosis not present

## 2020-05-06 DIAGNOSIS — I110 Hypertensive heart disease with heart failure: Secondary | ICD-10-CM | POA: Diagnosis not present

## 2020-05-06 DIAGNOSIS — E039 Hypothyroidism, unspecified: Secondary | ICD-10-CM | POA: Diagnosis not present

## 2020-05-06 DIAGNOSIS — D649 Anemia, unspecified: Secondary | ICD-10-CM | POA: Diagnosis not present

## 2020-05-06 DIAGNOSIS — E785 Hyperlipidemia, unspecified: Secondary | ICD-10-CM | POA: Diagnosis not present

## 2020-05-06 DIAGNOSIS — I10 Essential (primary) hypertension: Secondary | ICD-10-CM | POA: Diagnosis not present

## 2020-05-06 DIAGNOSIS — G9009 Other idiopathic peripheral autonomic neuropathy: Secondary | ICD-10-CM | POA: Diagnosis not present

## 2020-05-11 ENCOUNTER — Telehealth (INDEPENDENT_AMBULATORY_CARE_PROVIDER_SITE_OTHER): Payer: TRICARE For Life (TFL) | Admitting: Family Medicine

## 2020-05-11 ENCOUNTER — Other Ambulatory Visit: Payer: Self-pay

## 2020-05-11 DIAGNOSIS — M158 Other polyosteoarthritis: Secondary | ICD-10-CM

## 2020-05-11 DIAGNOSIS — I110 Hypertensive heart disease with heart failure: Secondary | ICD-10-CM | POA: Diagnosis not present

## 2020-05-11 DIAGNOSIS — D649 Anemia, unspecified: Secondary | ICD-10-CM | POA: Diagnosis not present

## 2020-05-11 DIAGNOSIS — E785 Hyperlipidemia, unspecified: Secondary | ICD-10-CM | POA: Diagnosis not present

## 2020-05-11 DIAGNOSIS — E559 Vitamin D deficiency, unspecified: Secondary | ICD-10-CM

## 2020-05-11 DIAGNOSIS — R739 Hyperglycemia, unspecified: Secondary | ICD-10-CM | POA: Diagnosis not present

## 2020-05-11 DIAGNOSIS — I10 Essential (primary) hypertension: Secondary | ICD-10-CM

## 2020-05-11 DIAGNOSIS — G9009 Other idiopathic peripheral autonomic neuropathy: Secondary | ICD-10-CM | POA: Diagnosis not present

## 2020-05-11 DIAGNOSIS — Z515 Encounter for palliative care: Secondary | ICD-10-CM

## 2020-05-11 DIAGNOSIS — E039 Hypothyroidism, unspecified: Secondary | ICD-10-CM | POA: Diagnosis not present

## 2020-05-11 NOTE — Assessment & Plan Note (Signed)
minimize simple carbs. Increase exercise as tolerated. Continue current meds  

## 2020-05-11 NOTE — Assessment & Plan Note (Signed)
Monitor and report any concerns. no changes to meds. Encouraged heart healthy diet such as the DASH diet and exercise as tolerated.  

## 2020-05-11 NOTE — Assessment & Plan Note (Signed)
Patient is being managed well at home and is essentially bed bound but otherwise doing well. Her daughters alternate caring for her and she is stable

## 2020-05-11 NOTE — Assessment & Plan Note (Signed)
Pain is well controlled with tylenol 650 mg qid and Tramadol 50 mg tid. No changes

## 2020-05-11 NOTE — Progress Notes (Signed)
Virtual Visit via Video Note  I connected with Gina Reilly on 05/11/20 at  2:20 PM EDT by a video enabled telemedicine application and verified that I am speaking with the correct person using two identifiers.  Location: Patient: home with daughter, Gina Reilly Provider: office, both patient and provider in visit   I discussed the limitations of evaluation and management by telemedicine and the availability of in person appointments. The patient expressed understanding and agreed to proceed. Kem Boroughs, CMA was able to get the patient set up on a video visit   Subjective:    Patient ID: Gina Reilly, female    DOB: 1924/03/17, 87 y.o.   MRN: [id number]  No chief complaint on file.   HPI Patient is in today for follow up. She is accompanied by her daughter and very comfortable in her hospital bed at home. They report she is doing well. No recent febrile illness or hospitalizations. She is experiencing adequate pain control with her current regimen of alternating Tylenol an dTramadol. She is eating well. Denies CP/palp/SOB/HA/congestion/fevers/GI or GU c/o. Taking meds as prescribed.  Past Medical History:  Diagnosis Date  . 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 Anemia   . Apnea, sleep 04/24/2012  . Arthritis    osteo  . Bronchitis 12/25/2011  . Chicken pox as a child  . Chronic atrial fibrillation ([place])    a. 07/2010 TEE/DCCV:  EF 60-65% mild MR.; b. Recurrent Afib--> Amiodarone;  Pradaxa (CHA2DS2VASc = 4);  c. Amio d/c'd 2/2 toxicity - now chronic rate-controlled afib.  . Constipation 11/22/2014  . Contusion of leg, left 01/16/2012  . GERD (gastroesophageal reflux disease)   . History of echocardiogram    a. 07/2015 Echo: EF 60-65%, Ao sclerosis, mild MR, mildly dil LA/RV/RA, mild Tr, PASP 37mmHg.  Marland Kitchen Hyperglycemia 01/10/2010   Qualifier: Diagnosis of  By: Niel Hummer  MD, [place] Hyperlipidemia   . Hypertension   . Hyperthyroidism 02/09/2009   Qualifier: Diagnosis of  By: Niel Hummer MD, [place] Hypoxia 04/14/2012  . Intermittent claudication ([place]) 05/22/2012  . Measles as a child  . Medicare annual wellness visit, subsequent 11/27/2014   Follows with pulmonology, Dr Gwenette Greet Follow with cardiology, Dr Rance Muir with Dr Matthew Saras Does not participate in [facility], pap or colonoscopy screening at this time.  . Mumps as a child  . Obese   . Ovarian cyst   . Pelvic mass in female   . Peripheral neuropathy 02/26/2012  . Precancerous skin lesion   . Skin cancer    left temple  . Thyroid disease    hypothroidism  . Vitamin D deficiency     Past Surgical History:  Procedure Laterality Date  . ABDOMINAL HYSTERECTOMY    . CATARACT EXTRACTION     Dr. Satira Sark  . SKIN BIOPSY     left temple. cancerous  . TOTAL HIP ARTHROPLASTY  2003   left    Family History  Problem Relation Age of Onset  . Stroke Mother   . Prostate cancer Father        prostate  . Hypertension Brother   . Cancer Maternal Grandmother   . Hypertension Brother   . Heart disease Brother   . Obesity Brother   . Hypertension Brother   . Hyperlipidemia Brother   . Heart disease Brother   .  Kidney disease Brother   . Cancer Daughter        kidney- spine and blood stream  . Heart disease Son        valvular heart disease  . Prostate cancer Brother     Social History   Socioeconomic History  . Marital status: Widowed    Spouse name: Not on file  . Number of children: 22  . Years of education: Not on file  . Highest education level: Not on file  Occupational History  . Occupation: retired    Fish farm manager: RETIRED  Tobacco Use  . Smoking status: Former Smoker    Packs/day: 0.30    Years: 5.00    Pack years: 1.50    Types: Cigarettes    Quit date: 10/28/1973    Years since quitting: 46.5  . Smokeless tobacco: Never Used  Substance and Sexual Activity  . Alcohol use:  Yes    Comment: socially  . Drug use: No  . Sexual activity: Not on file  Other Topics Concern  . Not on file  Social History Narrative  . Not on file   Social Determinants of Health   Financial Resource Strain:   . Difficulty of Paying Living Expenses:   Food Insecurity:   . Worried About Charity fundraiser in the Last Year:   . Arboriculturist in the Last Year:   Transportation Needs:   . Film/video editor (Medical):   Marland Kitchen Lack of Transportation (Non-Medical):   Physical Activity:   . Days of Exercise per Week:   . Minutes of Exercise per Session:   Stress:   . Feeling of Stress :   Social Connections:   . Frequency of Communication with Friends and Family:   . Frequency of Social Gatherings with Friends and Family:   . Attends Religious Services:   . Active Member of Clubs or Organizations:   . Attends Archivist Meetings:   Marland Kitchen Marital Status:   Intimate Partner Violence:   . Fear of Current or Ex-Partner:   . Emotionally Abused:   Marland Kitchen Physically Abused:   . Sexually Abused:     Outpatient Medications Prior to Visit  Medication Sig Dispense Refill  . acetaminophen (TYLENOL) 650 MG CR tablet Take 650 mg by mouth every 6 (six) hours as needed for pain.    . bisacodyl (DULCOLAX) 10 MG suppository Place 1 suppository (10 mg total) rectally daily as needed for moderate constipation. 12 suppository 0  . Cholecalciferol (VITAMIN D) 50 MCG (2000 UT) CAPS Take 2,000 Units by mouth daily.    Marland Kitchen diltiazem (CARDIZEM CD) 360 MG 24 hr capsule Take 1 capsule (360 mg total) by mouth daily. Pt needs to make appt with provider for further refills (Patient taking differently: Take 360 mg by mouth daily. ) 90 capsule 0  . ELIQUIS 5 MG TABS tablet TAKE 1 TABLET TWICE A DAY (Patient taking differently: Take 5 mg by mouth 2 (two) times daily. ) 180 tablet 3  . furosemide (LASIX) 20 MG tablet Take 1 tablet (20 mg total) by mouth daily as needed for fluid or edema. 30 tablet 5  .  LORazepam (ATIVAN) 0.5 MG tablet Take 0.5 mg by mouth every 4 (four) hours as needed.    Marland Kitchen losartan (COZAAR) 25 MG tablet Take 1 tablet (25 mg total) by mouth daily. Pt needs to make appt with provider for further refills 90 tablet 0  . nystatin (MYCOSTATIN/NYSTOP) powder Apply 1 application  topically 2 (two) times daily. 15 g 0  . ondansetron (ZOFRAN) 4 MG tablet Take 1 tablet (4 mg total) by mouth every 6 (six) hours as needed for nausea. 20 tablet 0  . polyethylene glycol (MIRALAX / GLYCOLAX) 17 g packet Take 17 g by mouth daily as needed for mild constipation. 14 each 0  . rOPINIRole (REQUIP XL) 4 MG 24 hr tablet Take 1 tablet (4 mg total) by mouth at bedtime. 30 tablet 0  . senna (SENOKOT) 8.6 MG TABS tablet Take 2 tablets (17.2 mg total) by mouth at bedtime. 120 tablet 0  . traMADol (ULTRAM) 50 MG tablet Take 1 tablet (50 mg total) by mouth every 12 (twelve) hours as needed for moderate pain. (Patient taking differently: Take 50 mg by mouth every 8 (eight) hours as needed for moderate pain. ) 30 tablet 0  . vitamin B-12 100 MCG tablet Take 1 tablet (100 mcg total) by mouth daily. 30 tablet 0  . Morphine Sulfate (MORPHINE CONCENTRATE) 10 mg / 0.5 ml concentrated solution SMARTSIG:5 Milligram(s) By Mouth Every 4 Hours PRN (Patient not taking: Reported on 05/11/2020)    . diclofenac Sodium (VOLTAREN) 1 % GEL Apply 2 g topically 2 (two) times daily. 350 g 0  . OXYCODONE HCL PO Take by mouth. Prn (Patient not taking: Reported on 05/11/2020)    . rOPINIRole (REQUIP) 0.5 MG tablet TAKE 1 TABLET TWICE A DAY 180 tablet 3   No facility-administered medications prior to visit.    Allergies  Allergen Reactions  . Amiodarone Other (See Comments)    Amiodarone pulmonary toxicity.  . Aspercreme [Trolamine Salicylate] Other (See Comments)    Pt states it caused redness like it was burning her skin.    Review of Systems  Constitutional: Positive for malaise/fatigue. Negative for fever.  HENT: Negative  for congestion.   Eyes: Negative for blurred vision.  Respiratory: Positive for shortness of breath.   Cardiovascular: Negative for chest pain, palpitations and leg swelling.  Gastrointestinal: Negative for abdominal pain, blood in stool and nausea.  Genitourinary: Negative for dysuria and frequency.  Musculoskeletal: Positive for back pain and joint pain. Negative for falls.  Skin: Negative for rash.  Neurological: Negative for dizziness, loss of consciousness and headaches.  Endo/Heme/Allergies: Negative for environmental allergies.  Psychiatric/Behavioral: Negative for depression. The patient is not nervous/anxious.        Objective:    Physical Exam Constitutional:      Appearance: Normal appearance. She is not ill-appearing.  HENT:     [place]: Normocephalic and atraumatic.     Right Ear: External ear normal.     Left Ear: External ear normal.     Nose: Nose normal.  Pulmonary:     Eff[place]: Pulmonary effort is normal.  Neurological:     Mental Status: She is alert and oriented to person, place, and time.  Psychiatric:        Behavior: Behavior normal.     BP 125/71   Pulse 68   Temp (!) 97.3 F (36.3 C)   LMP 12/25/2011   SpO2 96%  Wt Readings from Last 3 Encounters:  03/16/20 248 lb (112.5 kg)  12/02/19 230 lb (104.3 kg)  10/28/19 230 lb (104.3 kg)    Diabetic Foot Exam - Simple   No data filed     Lab Results  Component Value Date   WBC 8.1 03/15/2020   HGB 13.1 03/15/2020   HCT 41.6 03/15/2020   PLT 211 03/15/2020  GLUCOSE 129 (H) 03/15/2020   CHOL 151 12/02/2019   TRIG 128.0 12/02/2019   HDL 61.30 12/02/2019   LDLDIRECT 128.4 02/09/2009   LDLCALC 64 12/02/2019   ALT 18 03/12/2020   AST 20 03/12/2020   NA 136 03/15/2020   K 4.7 03/15/2020   CL 102 03/15/2020   CREATININE 0.72 03/15/2020   BUN 11 03/15/2020   CO2 25 03/15/2020   TSH 1.991 03/11/2020   INR 1.32 08/17/2010   HGBA1C 6.2 (H) 03/13/2020    Lab Results  Component Value Date    TSH 1.991 03/11/2020   Lab Results  Component Value Date   WBC 8.1 03/15/2020   HGB 13.1 03/15/2020   HCT 41.6 03/15/2020   MCV 107.8 (H) 03/15/2020   PLT 211 03/15/2020   Lab Results  Component Value Date   NA 136 03/15/2020   K 4.7 03/15/2020   CO2 25 03/15/2020   GLUCOSE 129 (H) 03/15/2020   BUN 11 03/15/2020   CREATININE 0.72 03/15/2020   BILITOT 1.1 03/12/2020   ALKPHOS 46 03/12/2020   AST 20 03/12/2020   ALT 18 03/12/2020   PROT 5.8 (L) 03/12/2020   ALBUMIN 3.4 (L) 03/15/2020   CALCIUM 9.3 03/15/2020   ANIONGAP 9 03/15/2020   GFR 63.14 12/02/2019   Lab Results  Component Value Date   CHOL 151 12/02/2019   Lab Results  Component Value Date   HDL 61.30 12/02/2019   Lab Results  Component Value Date   LDLCALC 64 12/02/2019   Lab Results  Component Value Date   TRIG 128.0 12/02/2019   Lab Results  Component Value Date   CHOLHDL 2 12/02/2019   Lab Results  Component Value Date   HGBA1C 6.2 (H) 03/13/2020       Assessment & Plan:   Problem List Items Addressed This Visit    Hyperglycemia    minimize simple carbs. Increase exercise as tolerated. Continue current meds      Vitamin D deficiency    Supplement and monitor      Osteoarthritis    Pain is well controlled with tylenol 650 mg qid and Tramadol 50 mg tid. No changes      Relevant Medications   Morphine Sulfate (MORPHINE CONCENTRATE) 10 mg / 0.5 ml concentrated solution   Hypertension    Monitor and report any concerns no changes to meds. Encouraged heart healthy diet such as the DASH diet and exercise as tolerated      Palliative care by specialist    Patient is being managed well at home and is essentially bed bound but otherwise doing well. Her daughters alternate caring for her and she is stable         I have discontinued Lempi N. Klostermann's diclofenac Sodium and OXYCODONE HCL PO. I am also having her maintain her Eliquis, losartan, diltiazem, Vitamin D, furosemide, rOPINIRole,  bisacodyl, nystatin, ondansetron, polyethylene glycol, senna, traMADol, cyanocobalamin, acetaminophen, LORazepam, and morphine CONCENTRATE.  No orders of the defined types were placed in this encounter.     I discussed the assessment and treatment plan with the patient. The patient was provided an opportunity to ask questions and all were answered. The patient agreed with the plan and demonstrated an understanding of the instructions.   The patient was advised to call back or seek an in-person evaluation if the symptoms worsen or if the condition fails to improve as anticipated.  I provided 20 minutes of non-face-to-face time during this encounter.   Penni Homans,  MD

## 2020-05-11 NOTE — Assessment & Plan Note (Signed)
Supplement and monitor 

## 2020-05-12 ENCOUNTER — Telehealth: Payer: Self-pay | Admitting: *Deleted

## 2020-05-12 NOTE — Telephone Encounter (Signed)
Left message on machine to call back   Needs to schedule for virtual visit in 8-12 weeks for follow up.

## 2020-05-16 DIAGNOSIS — E039 Hypothyroidism, unspecified: Secondary | ICD-10-CM | POA: Diagnosis not present

## 2020-05-16 DIAGNOSIS — I110 Hypertensive heart disease with heart failure: Secondary | ICD-10-CM | POA: Diagnosis not present

## 2020-05-16 DIAGNOSIS — I10 Essential (primary) hypertension: Secondary | ICD-10-CM | POA: Diagnosis not present

## 2020-05-16 DIAGNOSIS — E785 Hyperlipidemia, unspecified: Secondary | ICD-10-CM | POA: Diagnosis not present

## 2020-05-16 DIAGNOSIS — D649 Anemia, unspecified: Secondary | ICD-10-CM | POA: Diagnosis not present

## 2020-05-16 DIAGNOSIS — G9009 Other idiopathic peripheral autonomic neuropathy: Secondary | ICD-10-CM | POA: Diagnosis not present

## 2020-05-19 DIAGNOSIS — E785 Hyperlipidemia, unspecified: Secondary | ICD-10-CM | POA: Diagnosis not present

## 2020-05-19 DIAGNOSIS — E039 Hypothyroidism, unspecified: Secondary | ICD-10-CM | POA: Diagnosis not present

## 2020-05-19 DIAGNOSIS — I110 Hypertensive heart disease with heart failure: Secondary | ICD-10-CM | POA: Diagnosis not present

## 2020-05-19 DIAGNOSIS — D649 Anemia, unspecified: Secondary | ICD-10-CM | POA: Diagnosis not present

## 2020-05-19 DIAGNOSIS — G9009 Other idiopathic peripheral autonomic neuropathy: Secondary | ICD-10-CM | POA: Diagnosis not present

## 2020-05-19 DIAGNOSIS — I10 Essential (primary) hypertension: Secondary | ICD-10-CM | POA: Diagnosis not present

## 2020-05-23 DIAGNOSIS — G9009 Other idiopathic peripheral autonomic neuropathy: Secondary | ICD-10-CM | POA: Diagnosis not present

## 2020-05-23 DIAGNOSIS — E785 Hyperlipidemia, unspecified: Secondary | ICD-10-CM | POA: Diagnosis not present

## 2020-05-23 DIAGNOSIS — I110 Hypertensive heart disease with heart failure: Secondary | ICD-10-CM | POA: Diagnosis not present

## 2020-05-23 DIAGNOSIS — E039 Hypothyroidism, unspecified: Secondary | ICD-10-CM | POA: Diagnosis not present

## 2020-05-23 DIAGNOSIS — I10 Essential (primary) hypertension: Secondary | ICD-10-CM | POA: Diagnosis not present

## 2020-05-23 DIAGNOSIS — D649 Anemia, unspecified: Secondary | ICD-10-CM | POA: Diagnosis not present

## 2020-05-28 ENCOUNTER — Other Ambulatory Visit: Payer: Self-pay | Admitting: Cardiology

## 2020-05-28 DIAGNOSIS — Z515 Encounter for palliative care: Secondary | ICD-10-CM | POA: Diagnosis not present

## 2020-05-28 DIAGNOSIS — I10 Essential (primary) hypertension: Secondary | ICD-10-CM | POA: Diagnosis not present

## 2020-05-28 DIAGNOSIS — I4891 Unspecified atrial fibrillation: Secondary | ICD-10-CM | POA: Diagnosis not present

## 2020-05-28 DIAGNOSIS — D649 Anemia, unspecified: Secondary | ICD-10-CM | POA: Diagnosis not present

## 2020-05-28 DIAGNOSIS — M1990 Unspecified osteoarthritis, unspecified site: Secondary | ICD-10-CM | POA: Diagnosis not present

## 2020-05-28 DIAGNOSIS — E785 Hyperlipidemia, unspecified: Secondary | ICD-10-CM | POA: Diagnosis not present

## 2020-05-28 DIAGNOSIS — G9009 Other idiopathic peripheral autonomic neuropathy: Secondary | ICD-10-CM | POA: Diagnosis not present

## 2020-05-28 DIAGNOSIS — E039 Hypothyroidism, unspecified: Secondary | ICD-10-CM | POA: Diagnosis not present

## 2020-05-28 DIAGNOSIS — I110 Hypertensive heart disease with heart failure: Secondary | ICD-10-CM | POA: Diagnosis not present

## 2020-05-30 DIAGNOSIS — D649 Anemia, unspecified: Secondary | ICD-10-CM | POA: Diagnosis not present

## 2020-05-30 DIAGNOSIS — E785 Hyperlipidemia, unspecified: Secondary | ICD-10-CM | POA: Diagnosis not present

## 2020-05-30 DIAGNOSIS — E039 Hypothyroidism, unspecified: Secondary | ICD-10-CM | POA: Diagnosis not present

## 2020-05-30 DIAGNOSIS — I110 Hypertensive heart disease with heart failure: Secondary | ICD-10-CM | POA: Diagnosis not present

## 2020-05-30 DIAGNOSIS — I10 Essential (primary) hypertension: Secondary | ICD-10-CM | POA: Diagnosis not present

## 2020-05-30 DIAGNOSIS — G9009 Other idiopathic peripheral autonomic neuropathy: Secondary | ICD-10-CM | POA: Diagnosis not present

## 2020-05-31 DIAGNOSIS — I10 Essential (primary) hypertension: Secondary | ICD-10-CM | POA: Diagnosis not present

## 2020-05-31 DIAGNOSIS — E785 Hyperlipidemia, unspecified: Secondary | ICD-10-CM | POA: Diagnosis not present

## 2020-05-31 DIAGNOSIS — I110 Hypertensive heart disease with heart failure: Secondary | ICD-10-CM | POA: Diagnosis not present

## 2020-05-31 DIAGNOSIS — G9009 Other idiopathic peripheral autonomic neuropathy: Secondary | ICD-10-CM | POA: Diagnosis not present

## 2020-05-31 DIAGNOSIS — E039 Hypothyroidism, unspecified: Secondary | ICD-10-CM | POA: Diagnosis not present

## 2020-05-31 DIAGNOSIS — D649 Anemia, unspecified: Secondary | ICD-10-CM | POA: Diagnosis not present

## 2020-06-05 DIAGNOSIS — G9009 Other idiopathic peripheral autonomic neuropathy: Secondary | ICD-10-CM | POA: Diagnosis not present

## 2020-06-05 DIAGNOSIS — D649 Anemia, unspecified: Secondary | ICD-10-CM | POA: Diagnosis not present

## 2020-06-05 DIAGNOSIS — E785 Hyperlipidemia, unspecified: Secondary | ICD-10-CM | POA: Diagnosis not present

## 2020-06-05 DIAGNOSIS — E039 Hypothyroidism, unspecified: Secondary | ICD-10-CM | POA: Diagnosis not present

## 2020-06-05 DIAGNOSIS — I110 Hypertensive heart disease with heart failure: Secondary | ICD-10-CM | POA: Diagnosis not present

## 2020-06-05 DIAGNOSIS — I10 Essential (primary) hypertension: Secondary | ICD-10-CM | POA: Diagnosis not present

## 2020-06-13 DIAGNOSIS — I10 Essential (primary) hypertension: Secondary | ICD-10-CM | POA: Diagnosis not present

## 2020-06-13 DIAGNOSIS — E785 Hyperlipidemia, unspecified: Secondary | ICD-10-CM | POA: Diagnosis not present

## 2020-06-13 DIAGNOSIS — G9009 Other idiopathic peripheral autonomic neuropathy: Secondary | ICD-10-CM | POA: Diagnosis not present

## 2020-06-13 DIAGNOSIS — D649 Anemia, unspecified: Secondary | ICD-10-CM | POA: Diagnosis not present

## 2020-06-13 DIAGNOSIS — I110 Hypertensive heart disease with heart failure: Secondary | ICD-10-CM | POA: Diagnosis not present

## 2020-06-13 DIAGNOSIS — E039 Hypothyroidism, unspecified: Secondary | ICD-10-CM | POA: Diagnosis not present

## 2020-06-14 DIAGNOSIS — I10 Essential (primary) hypertension: Secondary | ICD-10-CM | POA: Diagnosis not present

## 2020-06-14 DIAGNOSIS — E785 Hyperlipidemia, unspecified: Secondary | ICD-10-CM | POA: Diagnosis not present

## 2020-06-14 DIAGNOSIS — E039 Hypothyroidism, unspecified: Secondary | ICD-10-CM | POA: Diagnosis not present

## 2020-06-14 DIAGNOSIS — I110 Hypertensive heart disease with heart failure: Secondary | ICD-10-CM | POA: Diagnosis not present

## 2020-06-14 DIAGNOSIS — G9009 Other idiopathic peripheral autonomic neuropathy: Secondary | ICD-10-CM | POA: Diagnosis not present

## 2020-06-14 DIAGNOSIS — D649 Anemia, unspecified: Secondary | ICD-10-CM | POA: Diagnosis not present

## 2020-06-21 DIAGNOSIS — G9009 Other idiopathic peripheral autonomic neuropathy: Secondary | ICD-10-CM | POA: Diagnosis not present

## 2020-06-21 DIAGNOSIS — I110 Hypertensive heart disease with heart failure: Secondary | ICD-10-CM | POA: Diagnosis not present

## 2020-06-21 DIAGNOSIS — E785 Hyperlipidemia, unspecified: Secondary | ICD-10-CM | POA: Diagnosis not present

## 2020-06-21 DIAGNOSIS — I10 Essential (primary) hypertension: Secondary | ICD-10-CM | POA: Diagnosis not present

## 2020-06-21 DIAGNOSIS — D649 Anemia, unspecified: Secondary | ICD-10-CM | POA: Diagnosis not present

## 2020-06-21 DIAGNOSIS — E039 Hypothyroidism, unspecified: Secondary | ICD-10-CM | POA: Diagnosis not present

## 2020-06-25 DIAGNOSIS — D649 Anemia, unspecified: Secondary | ICD-10-CM | POA: Diagnosis not present

## 2020-06-25 DIAGNOSIS — E039 Hypothyroidism, unspecified: Secondary | ICD-10-CM | POA: Diagnosis not present

## 2020-06-25 DIAGNOSIS — G9009 Other idiopathic peripheral autonomic neuropathy: Secondary | ICD-10-CM | POA: Diagnosis not present

## 2020-06-25 DIAGNOSIS — I10 Essential (primary) hypertension: Secondary | ICD-10-CM | POA: Diagnosis not present

## 2020-06-25 DIAGNOSIS — E785 Hyperlipidemia, unspecified: Secondary | ICD-10-CM | POA: Diagnosis not present

## 2020-06-25 DIAGNOSIS — I110 Hypertensive heart disease with heart failure: Secondary | ICD-10-CM | POA: Diagnosis not present

## 2020-06-28 DIAGNOSIS — Z515 Encounter for palliative care: Secondary | ICD-10-CM | POA: Diagnosis not present

## 2020-06-28 DIAGNOSIS — I110 Hypertensive heart disease with heart failure: Secondary | ICD-10-CM | POA: Diagnosis not present

## 2020-06-28 DIAGNOSIS — M1990 Unspecified osteoarthritis, unspecified site: Secondary | ICD-10-CM | POA: Diagnosis not present

## 2020-06-28 DIAGNOSIS — E785 Hyperlipidemia, unspecified: Secondary | ICD-10-CM | POA: Diagnosis not present

## 2020-06-28 DIAGNOSIS — D649 Anemia, unspecified: Secondary | ICD-10-CM | POA: Diagnosis not present

## 2020-06-28 DIAGNOSIS — I4891 Unspecified atrial fibrillation: Secondary | ICD-10-CM | POA: Diagnosis not present

## 2020-06-28 DIAGNOSIS — E039 Hypothyroidism, unspecified: Secondary | ICD-10-CM | POA: Diagnosis not present

## 2020-06-28 DIAGNOSIS — G9009 Other idiopathic peripheral autonomic neuropathy: Secondary | ICD-10-CM | POA: Diagnosis not present

## 2020-06-28 DIAGNOSIS — I10 Essential (primary) hypertension: Secondary | ICD-10-CM | POA: Diagnosis not present

## 2020-07-06 ENCOUNTER — Other Ambulatory Visit: Payer: Self-pay

## 2020-07-06 ENCOUNTER — Telehealth (INDEPENDENT_AMBULATORY_CARE_PROVIDER_SITE_OTHER): Admitting: Family Medicine

## 2020-07-06 DIAGNOSIS — E559 Vitamin D deficiency, unspecified: Secondary | ICD-10-CM | POA: Diagnosis not present

## 2020-07-06 DIAGNOSIS — I10 Essential (primary) hypertension: Secondary | ICD-10-CM

## 2020-07-06 DIAGNOSIS — M1990 Unspecified osteoarthritis, unspecified site: Secondary | ICD-10-CM | POA: Diagnosis not present

## 2020-07-06 DIAGNOSIS — I110 Hypertensive heart disease with heart failure: Secondary | ICD-10-CM | POA: Diagnosis not present

## 2020-07-06 DIAGNOSIS — D649 Anemia, unspecified: Secondary | ICD-10-CM | POA: Diagnosis not present

## 2020-07-06 DIAGNOSIS — E039 Hypothyroidism, unspecified: Secondary | ICD-10-CM | POA: Diagnosis not present

## 2020-07-06 DIAGNOSIS — I4811 Longstanding persistent atrial fibrillation: Secondary | ICD-10-CM

## 2020-07-06 DIAGNOSIS — R739 Hyperglycemia, unspecified: Secondary | ICD-10-CM

## 2020-07-06 DIAGNOSIS — G9009 Other idiopathic peripheral autonomic neuropathy: Secondary | ICD-10-CM | POA: Diagnosis not present

## 2020-07-06 DIAGNOSIS — E785 Hyperlipidemia, unspecified: Secondary | ICD-10-CM | POA: Diagnosis not present

## 2020-07-06 MED ORDER — TRAMADOL HCL 50 MG PO TABS: 50.0000 mg | ORAL_TABLET | Freq: Three times a day (TID) | ORAL | Status: DC | PRN

## 2020-07-10 NOTE — Progress Notes (Signed)
Virtual Visit via Video Note  I connected with Gina Reilly on 07/06/20 at  3:00 PM EDT by a video enabled telemedicine application and verified that I am speaking with the correct person using two identifiers.  Location: Patient: home, with daughter, patient, daughter and provider in visit Provider: home   I discussed the limitations of evaluation and management by telemedicine and the availability of in person appointments. The patient expressed understanding and agreed to proceed. CMA was able to get patient set up on a video visit    Subjective:    Patient ID: Gina Reilly, female    DOB: 1923/12/08, 87 y.o.   MRN: [id number]  Chief Complaint  Patient presents with  . followup joint concerns    HPI Patient is in today for follow up on chronic medical concerns. No recent febrile illness or hospitalizations. She is accompanied by her daughter. They are caring for her at home with 24 hour help and she is doing well. She is alternating Tramadol and Tylenol with good pain control. No new concerns. Denies CP/palp/SOB/HA/congestion/fevers/GI or GU c/o. Taking meds as prescribed  Past Medical History:  Diagnosis Date  . 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 Anemia   . Apnea, sleep 04/24/2012  . Arthritis    osteo  . Bronchitis 12/25/2011  . Chicken pox as a child  . Chronic atrial fibrillation ([place])    a. 07/2010 TEE/DCCV:  EF 60-65% mild MR.; b. Recurrent Afib--> Amiodarone;  Pradaxa (CHA2DS2VASc = 4);  c. Amio d/c'd 2/2 toxicity - now chronic rate-controlled afib.  . Constipation 11/22/2014  . Contusion of leg, left 01/16/2012  . GERD (gastroesophageal reflux disease)   . History of echocardiogram    a. 07/2015 Echo: EF 60-65%, Ao sclerosis, mild MR, mildly dil LA/RV/RA, mild Tr, PASP 44mmHg.  Marland Kitchen Hyperglycemia 01/10/2010   Qualifier: Diagnosis of  By: Niel Hummer MD, [place] Hyperlipidemia   . Hypertension   . Hyperthyroidism 02/09/2009   Qualifier: Diagnosis of  By: Niel Hummer MD, [place] Hypoxia 04/14/2012  . Intermittent claudication ([place]) 05/22/2012  . Measles as a child  . Medicare annual wellness visit, subsequent 11/27/2014   Follows with pulmonology, Dr Gwenette Greet Follow with cardiology, Dr Rance Muir with Dr Matthew Saras Does not participate in [facility], pap or colonoscopy screening at this time.  . Mumps as a child  . Obese   . Ovarian cyst   . Pelvic mass in female   . Peripheral neuropathy 02/26/2012  . Precancerous skin lesion   . Skin cancer    left temple  . Thyroid disease    hypothroidism  . Vitamin D deficiency     Past Surgical History:  Procedure Laterality Date  . ABDOMINAL HYSTERECTOMY    . CATARACT EXTRACTION     Dr. Satira Sark  . SKIN BIOPSY     left temple. cancerous  . TOTAL HIP ARTHROPLASTY  2003   left    Family History  Problem Relation Age of Onset  . Stroke Mother   . Prostate cancer Father        prostate  . Hypertension Brother   . Cancer Maternal Grandmother   . Hypertension Brother   . Heart disease Brother   . Obesity Brother   . Hypertension Brother   . Hyperlipidemia Brother   . Heart disease  Brother   . Kidney disease Brother   . Cancer Daughter        kidney- spine and blood stream  . Heart disease Son        valvular heart disease  . Prostate cancer Brother     Social History   Socioeconomic History  . Marital status: Widowed    Spouse name: Not on file  . Number of children: 13  . Years of education: Not on file  . Highest education level: Not on file  Occupational History  . Occupation: retired    Fish farm manager: RETIRED  Tobacco Use  . Smoking status: Former Smoker    Packs/day: 0.30    Years: 5.00    Pack years: 1.50    Types: Cigarettes    Quit date: 10/28/1973    Years since quitting: 46.7  . Smokeless tobacco: Never Used  Substance and Sexual Activity  . Alcohol use: Yes     Comment: socially  . Drug use: No  . Sexual activity: Not on file  Other Topics Concern  . Not on file  Social History Narrative  . Not on file   Social Determinants of Health   Financial Resource Strain:   . Difficulty of Paying Living Expenses: Not on file  Food Insecurity:   . Worried About Charity fundraiser in the Last Year: Not on file  . Ran Out of Food in the Last Year: Not on file  Transportation Needs:   . Lack of Transportation (Medical): Not on file  . Lack of Transportation (Non-Medical): Not on file  Physical Activity:   . Days of Exercise per Week: Not on file  . Minutes of Exercise per Session: Not on file  Stress:   . Feeling of Stress : Not on file  Social Connections:   . Frequency of Communication with Friends and Family: Not on file  . Frequency of Social Gatherings with Friends and Family: Not on file  . Attends Religious Services: Not on file  . Active Member of Clubs or Organizations: Not on file  . Attends Archivist Meetings: Not on file  . Marital Status: Not on file  Intimate Partner Violence:   . Fear of Current or Ex-Partner: Not on file  . Emotionally Abused: Not on file  . Physically Abused: Not on file  . Sexually Abused: Not on file    Outpatient Medications Prior to Visit  Medication Sig Dispense Refill  . acetaminophen (TYLENOL) 650 MG CR tablet Take 650 mg by mouth every 6 (six) hours as needed for pain.    . bisacodyl (DULCOLAX) 10 MG suppository Place 1 suppository (10 mg total) rectally daily as needed for moderate constipation. 12 suppository 0  . Cholecalciferol (VITAMIN D) 50 MCG (2000 UT) CAPS Take 2,000 Units by mouth daily.    Marland Kitchen diltiazem (CARDIZEM CD) 360 MG 24 hr capsule TAKE 1 CAPSULE DAILY (NEED TO MAKE APPOINTMENT WITH PROVIDER FOR FURTHER REFILLS) 90 capsule 1  . ELIQUIS 5 MG TABS tablet TAKE 1 TABLET TWICE A DAY (Patient taking differently: Take 5 mg by mouth 2 (two) times daily. ) 180 tablet 3  . furosemide  (LASIX) 20 MG tablet Take 1 tablet (20 mg total) by mouth daily as needed for fluid or edema. 30 tablet 5  . LORazepam (ATIVAN) 0.5 MG tablet Take 0.5 mg by mouth every 4 (four) hours as needed.    Marland Kitchen losartan (COZAAR) 25 MG tablet TAKE 1 TABLET DAILY (PATIENT NEEDS  TO MAKE APPOINTMENT WITH PROVIDER FOR FURTHER REFILLS) 90 tablet 1  . Morphine Sulfate (MORPHINE CONCENTRATE) 10 mg / 0.5 ml concentrated solution SMARTSIG:5 Milligram(s) By Mouth Every 4 Hours PRN    . nystatin (MYCOSTATIN/NYSTOP) powder Apply 1 application topically 2 (two) times daily. 15 g 0  . ondansetron (ZOFRAN) 4 MG tablet Take 1 tablet (4 mg total) by mouth every 6 (six) hours as needed for nausea. 20 tablet 0  . polyethylene glycol (MIRALAX / GLYCOLAX) 17 g packet Take 17 g by mouth daily as needed for mild constipation. 14 each 0  . rOPINIRole (REQUIP XL) 4 MG 24 hr tablet Take 1 tablet (4 mg total) by mouth at bedtime. 30 tablet 0  . senna (SENOKOT) 8.6 MG TABS tablet Take 2 tablets (17.2 mg total) by mouth at bedtime. 120 tablet 0  . vitamin B-12 100 MCG tablet Take 1 tablet (100 mcg total) by mouth daily. 30 tablet 0  . traMADol (ULTRAM) 50 MG tablet Take 1 tablet (50 mg total) by mouth every 12 (twelve) hours as needed for moderate pain. (Patient taking differently: Take 50 mg by mouth every 8 (eight) hours as needed for moderate pain. ) 30 tablet 0   No facility-administered medications prior to visit.    Allergies  Allergen Reactions  . Amiodarone Other (See Comments)    Amiodarone pulmonary toxicity.  . Aspercreme [Trolamine Salicylate] Other (See Comments)    Pt states it caused redness like it was burning her skin.    Review of Systems  Constitutional: Positive for malaise/fatigue. Negative for fever.  HENT: Negative for congestion.   Eyes: Negative for blurred vision.  Respiratory: Positive for shortness of breath.   Cardiovascular: Negative for chest pain, palpitations and leg swelling.  Gastrointestinal:  Negative for abdominal pain, blood in stool and nausea.  Genitourinary: Negative for dysuria and frequency.  Musculoskeletal: Positive for back pain. Negative for falls.  Skin: Negative for rash.  Neurological: Negative for dizziness, loss of consciousness and headaches.  Endo/Heme/Allergies: Negative for environmental allergies.  Psychiatric/Behavioral: Negative for depression. The patient is not nervous/anxious.        Objective:    Physical Exam Constitutional:      Appearance: Normal appearance. She is not ill-appearing.  HENT:     [place]: Normocephalic and atraumatic.     Right Ear: External ear normal.     Left Ear: External ear normal.     Nose: Nose normal.  Eyes:     General:        Right eye: No discharge.        Left eye: No discharge.  Pulmonary:     Effort: Pulmonary effort is normal.  Neurological:     Mental Status: She is alert and oriented to person, place, and time.  Psychiatric:        Behavior: Behavior normal.     Ht 5\' 7"  (1.702 m)   LMP 12/25/2011   BMI 38.84 kg/m  Wt Readings from Last 3 Encounters:  03/16/20 248 lb (112.5 kg)  12/02/19 230 lb (104.3 kg)  10/28/19 230 lb (104.3 kg)    Diabetic Foot Exam - Simple   No data filed     Lab Results  Component Value Date   WBC 8.1 03/15/2020   HGB 13.1 03/15/2020   HCT 41.6 03/15/2020   PLT 211 03/15/2020   GLUCOSE 129 (H) 03/15/2020   CHOL 151 12/02/2019   TRIG 128.0 12/02/2019   HDL 61.30 12/02/2019  LDLDIRECT 128.4 02/09/2009   LDLCALC 64 12/02/2019   ALT 18 03/12/2020   AST 20 03/12/2020   NA 136 03/15/2020   K 4.7 03/15/2020   CL 102 03/15/2020   CREATININE 0.72 03/15/2020   BUN 11 03/15/2020   CO2 25 03/15/2020   TSH 1.991 03/11/2020   INR 1.32 08/17/2010   HGBA1C 6.2 (H) 03/13/2020    Lab Results  Component Value Date   TSH 1.991 03/11/2020   Lab Results  Component Value Date   WBC 8.1 03/15/2020   HGB 13.1 03/15/2020   HCT 41.6 03/15/2020   MCV 107.8 (H)  03/15/2020   PLT 211 03/15/2020   Lab Results  Component Value Date   NA 136 03/15/2020   K 4.7 03/15/2020   CO2 25 03/15/2020   GLUCOSE 129 (H) 03/15/2020   BUN 11 03/15/2020   CREATININE 0.72 03/15/2020   BILITOT 1.1 03/12/2020   ALKPHOS 46 03/12/2020   AST 20 03/12/2020   ALT 18 03/12/2020   PROT 5.8 (L) 03/12/2020   ALBUMIN 3.4 (L) 03/15/2020   CALCIUM 9.3 03/15/2020   ANIONGAP 9 03/15/2020   GFR 63.14 12/02/2019   Lab Results  Component Value Date   CHOL 151 12/02/2019   Lab Results  Component Value Date   HDL 61.30 12/02/2019   Lab Results  Component Value Date   LDLCALC 64 12/02/2019   Lab Results  Component Value Date   TRIG 128.0 12/02/2019   Lab Results  Component Value Date   CHOLHDL 2 12/02/2019   Lab Results  Component Value Date   HGBA1C 6.2 (H) 03/13/2020       Assessment & Plan:   Problem List Items Addressed This Visit    Hyperglycemia    hgba1c acceptable, minimize simple carbs. Increase exercise as tolerated.       Vitamin D deficiency    Supplement and monitor      ATRIAL FIBRILLATION    End stage heart disease. Patient is at home with 24 hour care and she is doing well although she is bed bound at this time.       Arthritis    They are alternating Tramadol and Tylenol on a schedule and that has improved her pain control substantially. No changes. No side effects noted      Relevant Medications   traMADol (ULTRAM) 50 MG tablet   Hypertension    Monitor and report any concerns. no changes to meds. Encouraged heart healthy diet such as the DASH diet and exercise as tolerated.       Hypomagnesemia    Supplement and monitor         I have changed Vance N. Kuenzi's traMADol. I am also having her maintain her Eliquis, Vitamin D, furosemide, rOPINIRole, bisacodyl, nystatin, ondansetron, polyethylene glycol, senna, cyanocobalamin, acetaminophen, LORazepam, morphine CONCENTRATE, diltiazem, and losartan.  Meds ordered this  encounter  Medications  . traMADol (ULTRAM) 50 MG tablet    Sig: Take 1 tablet (50 mg total) by mouth every 8 (eight) hours as needed for moderate pain.     I discussed the assessment and treatment plan with the patient. The patient was provided an opportunity to ask questions and all were answered. The patient agreed with the plan and demonstrated an understanding of the instructions.   The patient was advised to call back or seek an in-person evaluation if the symptoms worsen or if the condition fails to improve as anticipated.  I provided 20 minutes of non-face-to-face  time during this encounter.   Penni Homans, MD

## 2020-07-10 NOTE — Assessment & Plan Note (Signed)
Monitor and report any concerns. no changes to meds. Encouraged heart healthy diet such as the DASH diet and exercise as tolerated.  

## 2020-07-10 NOTE — Assessment & Plan Note (Signed)
hgba1c acceptable, minimize simple carbs. Increase exercise as tolerated.  

## 2020-07-10 NOTE — Assessment & Plan Note (Signed)
Supplement and monitor 

## 2020-07-10 NOTE — Assessment & Plan Note (Signed)
End stage heart disease. Patient is at home with 24 hour care and she is doing well although she is bed bound at this time.

## 2020-07-10 NOTE — Assessment & Plan Note (Signed)
They are alternating Tramadol and Tylenol on a schedule and that has improved her pain control substantially. No changes. No side effects noted

## 2020-07-11 ENCOUNTER — Encounter: Payer: Self-pay | Admitting: Family Medicine

## 2020-07-12 DIAGNOSIS — G9009 Other idiopathic peripheral autonomic neuropathy: Secondary | ICD-10-CM | POA: Diagnosis not present

## 2020-07-12 DIAGNOSIS — I10 Essential (primary) hypertension: Secondary | ICD-10-CM | POA: Diagnosis not present

## 2020-07-12 DIAGNOSIS — D649 Anemia, unspecified: Secondary | ICD-10-CM | POA: Diagnosis not present

## 2020-07-12 DIAGNOSIS — E039 Hypothyroidism, unspecified: Secondary | ICD-10-CM | POA: Diagnosis not present

## 2020-07-12 DIAGNOSIS — E785 Hyperlipidemia, unspecified: Secondary | ICD-10-CM | POA: Diagnosis not present

## 2020-07-12 DIAGNOSIS — I110 Hypertensive heart disease with heart failure: Secondary | ICD-10-CM | POA: Diagnosis not present

## 2020-07-17 DIAGNOSIS — D649 Anemia, unspecified: Secondary | ICD-10-CM | POA: Diagnosis not present

## 2020-07-17 DIAGNOSIS — G9009 Other idiopathic peripheral autonomic neuropathy: Secondary | ICD-10-CM | POA: Diagnosis not present

## 2020-07-17 DIAGNOSIS — I110 Hypertensive heart disease with heart failure: Secondary | ICD-10-CM | POA: Diagnosis not present

## 2020-07-17 DIAGNOSIS — E785 Hyperlipidemia, unspecified: Secondary | ICD-10-CM | POA: Diagnosis not present

## 2020-07-17 DIAGNOSIS — E039 Hypothyroidism, unspecified: Secondary | ICD-10-CM | POA: Diagnosis not present

## 2020-07-17 DIAGNOSIS — I10 Essential (primary) hypertension: Secondary | ICD-10-CM | POA: Diagnosis not present

## 2020-07-18 DIAGNOSIS — I110 Hypertensive heart disease with heart failure: Secondary | ICD-10-CM | POA: Diagnosis not present

## 2020-07-18 DIAGNOSIS — I10 Essential (primary) hypertension: Secondary | ICD-10-CM | POA: Diagnosis not present

## 2020-07-18 DIAGNOSIS — E785 Hyperlipidemia, unspecified: Secondary | ICD-10-CM | POA: Diagnosis not present

## 2020-07-18 DIAGNOSIS — G9009 Other idiopathic peripheral autonomic neuropathy: Secondary | ICD-10-CM | POA: Diagnosis not present

## 2020-07-18 DIAGNOSIS — E039 Hypothyroidism, unspecified: Secondary | ICD-10-CM | POA: Diagnosis not present

## 2020-07-18 DIAGNOSIS — D649 Anemia, unspecified: Secondary | ICD-10-CM | POA: Diagnosis not present

## 2020-07-20 DIAGNOSIS — I110 Hypertensive heart disease with heart failure: Secondary | ICD-10-CM | POA: Diagnosis not present

## 2020-07-20 DIAGNOSIS — E785 Hyperlipidemia, unspecified: Secondary | ICD-10-CM | POA: Diagnosis not present

## 2020-07-20 DIAGNOSIS — E039 Hypothyroidism, unspecified: Secondary | ICD-10-CM | POA: Diagnosis not present

## 2020-07-20 DIAGNOSIS — G9009 Other idiopathic peripheral autonomic neuropathy: Secondary | ICD-10-CM | POA: Diagnosis not present

## 2020-07-20 DIAGNOSIS — D649 Anemia, unspecified: Secondary | ICD-10-CM | POA: Diagnosis not present

## 2020-07-20 DIAGNOSIS — I10 Essential (primary) hypertension: Secondary | ICD-10-CM | POA: Diagnosis not present

## 2020-07-21 DIAGNOSIS — E785 Hyperlipidemia, unspecified: Secondary | ICD-10-CM | POA: Diagnosis not present

## 2020-07-21 DIAGNOSIS — I10 Essential (primary) hypertension: Secondary | ICD-10-CM | POA: Diagnosis not present

## 2020-07-21 DIAGNOSIS — E039 Hypothyroidism, unspecified: Secondary | ICD-10-CM | POA: Diagnosis not present

## 2020-07-21 DIAGNOSIS — I110 Hypertensive heart disease with heart failure: Secondary | ICD-10-CM | POA: Diagnosis not present

## 2020-07-21 DIAGNOSIS — G9009 Other idiopathic peripheral autonomic neuropathy: Secondary | ICD-10-CM | POA: Diagnosis not present

## 2020-07-21 DIAGNOSIS — D649 Anemia, unspecified: Secondary | ICD-10-CM | POA: Diagnosis not present

## 2020-07-25 DIAGNOSIS — E785 Hyperlipidemia, unspecified: Secondary | ICD-10-CM | POA: Diagnosis not present

## 2020-07-25 DIAGNOSIS — I110 Hypertensive heart disease with heart failure: Secondary | ICD-10-CM | POA: Diagnosis not present

## 2020-07-25 DIAGNOSIS — I10 Essential (primary) hypertension: Secondary | ICD-10-CM | POA: Diagnosis not present

## 2020-07-25 DIAGNOSIS — E039 Hypothyroidism, unspecified: Secondary | ICD-10-CM | POA: Diagnosis not present

## 2020-07-25 DIAGNOSIS — D649 Anemia, unspecified: Secondary | ICD-10-CM | POA: Diagnosis not present

## 2020-07-25 DIAGNOSIS — G9009 Other idiopathic peripheral autonomic neuropathy: Secondary | ICD-10-CM | POA: Diagnosis not present

## 2020-07-27 DIAGNOSIS — I110 Hypertensive heart disease with heart failure: Secondary | ICD-10-CM | POA: Diagnosis not present

## 2020-07-27 DIAGNOSIS — E039 Hypothyroidism, unspecified: Secondary | ICD-10-CM | POA: Diagnosis not present

## 2020-07-27 DIAGNOSIS — G9009 Other idiopathic peripheral autonomic neuropathy: Secondary | ICD-10-CM | POA: Diagnosis not present

## 2020-07-27 DIAGNOSIS — E785 Hyperlipidemia, unspecified: Secondary | ICD-10-CM | POA: Diagnosis not present

## 2020-07-27 DIAGNOSIS — I10 Essential (primary) hypertension: Secondary | ICD-10-CM | POA: Diagnosis not present

## 2020-07-27 DIAGNOSIS — D649 Anemia, unspecified: Secondary | ICD-10-CM | POA: Diagnosis not present

## 2020-07-28 DIAGNOSIS — E785 Hyperlipidemia, unspecified: Secondary | ICD-10-CM | POA: Diagnosis not present

## 2020-07-28 DIAGNOSIS — I110 Hypertensive heart disease with heart failure: Secondary | ICD-10-CM | POA: Diagnosis not present

## 2020-07-28 DIAGNOSIS — G9009 Other idiopathic peripheral autonomic neuropathy: Secondary | ICD-10-CM | POA: Diagnosis not present

## 2020-07-28 DIAGNOSIS — Z515 Encounter for palliative care: Secondary | ICD-10-CM | POA: Diagnosis not present

## 2020-07-28 DIAGNOSIS — M1990 Unspecified osteoarthritis, unspecified site: Secondary | ICD-10-CM | POA: Diagnosis not present

## 2020-07-28 DIAGNOSIS — D649 Anemia, unspecified: Secondary | ICD-10-CM | POA: Diagnosis not present

## 2020-07-28 DIAGNOSIS — E039 Hypothyroidism, unspecified: Secondary | ICD-10-CM | POA: Diagnosis not present

## 2020-07-28 DIAGNOSIS — I10 Essential (primary) hypertension: Secondary | ICD-10-CM | POA: Diagnosis not present

## 2020-07-28 DIAGNOSIS — I4891 Unspecified atrial fibrillation: Secondary | ICD-10-CM | POA: Diagnosis not present

## 2020-08-03 DIAGNOSIS — D649 Anemia, unspecified: Secondary | ICD-10-CM | POA: Diagnosis not present

## 2020-08-03 DIAGNOSIS — E039 Hypothyroidism, unspecified: Secondary | ICD-10-CM | POA: Diagnosis not present

## 2020-08-03 DIAGNOSIS — G9009 Other idiopathic peripheral autonomic neuropathy: Secondary | ICD-10-CM | POA: Diagnosis not present

## 2020-08-03 DIAGNOSIS — I110 Hypertensive heart disease with heart failure: Secondary | ICD-10-CM | POA: Diagnosis not present

## 2020-08-03 DIAGNOSIS — E785 Hyperlipidemia, unspecified: Secondary | ICD-10-CM | POA: Diagnosis not present

## 2020-08-03 DIAGNOSIS — I10 Essential (primary) hypertension: Secondary | ICD-10-CM | POA: Diagnosis not present

## 2020-08-04 DIAGNOSIS — G9009 Other idiopathic peripheral autonomic neuropathy: Secondary | ICD-10-CM | POA: Diagnosis not present

## 2020-08-04 DIAGNOSIS — E039 Hypothyroidism, unspecified: Secondary | ICD-10-CM | POA: Diagnosis not present

## 2020-08-04 DIAGNOSIS — E785 Hyperlipidemia, unspecified: Secondary | ICD-10-CM | POA: Diagnosis not present

## 2020-08-04 DIAGNOSIS — D649 Anemia, unspecified: Secondary | ICD-10-CM | POA: Diagnosis not present

## 2020-08-04 DIAGNOSIS — I110 Hypertensive heart disease with heart failure: Secondary | ICD-10-CM | POA: Diagnosis not present

## 2020-08-04 DIAGNOSIS — I10 Essential (primary) hypertension: Secondary | ICD-10-CM | POA: Diagnosis not present

## 2020-08-09 DIAGNOSIS — D649 Anemia, unspecified: Secondary | ICD-10-CM | POA: Diagnosis not present

## 2020-08-09 DIAGNOSIS — I110 Hypertensive heart disease with heart failure: Secondary | ICD-10-CM | POA: Diagnosis not present

## 2020-08-09 DIAGNOSIS — E039 Hypothyroidism, unspecified: Secondary | ICD-10-CM | POA: Diagnosis not present

## 2020-08-09 DIAGNOSIS — I10 Essential (primary) hypertension: Secondary | ICD-10-CM | POA: Diagnosis not present

## 2020-08-09 DIAGNOSIS — G9009 Other idiopathic peripheral autonomic neuropathy: Secondary | ICD-10-CM | POA: Diagnosis not present

## 2020-08-09 DIAGNOSIS — E785 Hyperlipidemia, unspecified: Secondary | ICD-10-CM | POA: Diagnosis not present

## 2020-08-10 DIAGNOSIS — D649 Anemia, unspecified: Secondary | ICD-10-CM | POA: Diagnosis not present

## 2020-08-10 DIAGNOSIS — E785 Hyperlipidemia, unspecified: Secondary | ICD-10-CM | POA: Diagnosis not present

## 2020-08-10 DIAGNOSIS — G9009 Other idiopathic peripheral autonomic neuropathy: Secondary | ICD-10-CM | POA: Diagnosis not present

## 2020-08-10 DIAGNOSIS — I10 Essential (primary) hypertension: Secondary | ICD-10-CM | POA: Diagnosis not present

## 2020-08-10 DIAGNOSIS — I110 Hypertensive heart disease with heart failure: Secondary | ICD-10-CM | POA: Diagnosis not present

## 2020-08-10 DIAGNOSIS — E039 Hypothyroidism, unspecified: Secondary | ICD-10-CM | POA: Diagnosis not present

## 2020-08-16 DIAGNOSIS — G9009 Other idiopathic peripheral autonomic neuropathy: Secondary | ICD-10-CM | POA: Diagnosis not present

## 2020-08-16 DIAGNOSIS — I110 Hypertensive heart disease with heart failure: Secondary | ICD-10-CM | POA: Diagnosis not present

## 2020-08-16 DIAGNOSIS — D649 Anemia, unspecified: Secondary | ICD-10-CM | POA: Diagnosis not present

## 2020-08-16 DIAGNOSIS — I10 Essential (primary) hypertension: Secondary | ICD-10-CM | POA: Diagnosis not present

## 2020-08-16 DIAGNOSIS — E039 Hypothyroidism, unspecified: Secondary | ICD-10-CM | POA: Diagnosis not present

## 2020-08-16 DIAGNOSIS — E785 Hyperlipidemia, unspecified: Secondary | ICD-10-CM | POA: Diagnosis not present

## 2020-08-18 DIAGNOSIS — D649 Anemia, unspecified: Secondary | ICD-10-CM | POA: Diagnosis not present

## 2020-08-18 DIAGNOSIS — E039 Hypothyroidism, unspecified: Secondary | ICD-10-CM | POA: Diagnosis not present

## 2020-08-18 DIAGNOSIS — I10 Essential (primary) hypertension: Secondary | ICD-10-CM | POA: Diagnosis not present

## 2020-08-18 DIAGNOSIS — G9009 Other idiopathic peripheral autonomic neuropathy: Secondary | ICD-10-CM | POA: Diagnosis not present

## 2020-08-18 DIAGNOSIS — E785 Hyperlipidemia, unspecified: Secondary | ICD-10-CM | POA: Diagnosis not present

## 2020-08-18 DIAGNOSIS — I110 Hypertensive heart disease with heart failure: Secondary | ICD-10-CM | POA: Diagnosis not present

## 2020-08-22 DIAGNOSIS — E785 Hyperlipidemia, unspecified: Secondary | ICD-10-CM | POA: Diagnosis not present

## 2020-08-22 DIAGNOSIS — D649 Anemia, unspecified: Secondary | ICD-10-CM | POA: Diagnosis not present

## 2020-08-22 DIAGNOSIS — I10 Essential (primary) hypertension: Secondary | ICD-10-CM | POA: Diagnosis not present

## 2020-08-22 DIAGNOSIS — E039 Hypothyroidism, unspecified: Secondary | ICD-10-CM | POA: Diagnosis not present

## 2020-08-22 DIAGNOSIS — G9009 Other idiopathic peripheral autonomic neuropathy: Secondary | ICD-10-CM | POA: Diagnosis not present

## 2020-08-22 DIAGNOSIS — I110 Hypertensive heart disease with heart failure: Secondary | ICD-10-CM | POA: Diagnosis not present

## 2020-08-23 DIAGNOSIS — D649 Anemia, unspecified: Secondary | ICD-10-CM | POA: Diagnosis not present

## 2020-08-23 DIAGNOSIS — E785 Hyperlipidemia, unspecified: Secondary | ICD-10-CM | POA: Diagnosis not present

## 2020-08-23 DIAGNOSIS — E039 Hypothyroidism, unspecified: Secondary | ICD-10-CM | POA: Diagnosis not present

## 2020-08-23 DIAGNOSIS — I110 Hypertensive heart disease with heart failure: Secondary | ICD-10-CM | POA: Diagnosis not present

## 2020-08-23 DIAGNOSIS — G9009 Other idiopathic peripheral autonomic neuropathy: Secondary | ICD-10-CM | POA: Diagnosis not present

## 2020-08-23 DIAGNOSIS — I10 Essential (primary) hypertension: Secondary | ICD-10-CM | POA: Diagnosis not present

## 2020-08-24 DIAGNOSIS — E785 Hyperlipidemia, unspecified: Secondary | ICD-10-CM | POA: Diagnosis not present

## 2020-08-24 DIAGNOSIS — E039 Hypothyroidism, unspecified: Secondary | ICD-10-CM | POA: Diagnosis not present

## 2020-08-24 DIAGNOSIS — D649 Anemia, unspecified: Secondary | ICD-10-CM | POA: Diagnosis not present

## 2020-08-24 DIAGNOSIS — I10 Essential (primary) hypertension: Secondary | ICD-10-CM | POA: Diagnosis not present

## 2020-08-24 DIAGNOSIS — I110 Hypertensive heart disease with heart failure: Secondary | ICD-10-CM | POA: Diagnosis not present

## 2020-08-24 DIAGNOSIS — G9009 Other idiopathic peripheral autonomic neuropathy: Secondary | ICD-10-CM | POA: Diagnosis not present

## 2020-08-28 DIAGNOSIS — D649 Anemia, unspecified: Secondary | ICD-10-CM | POA: Diagnosis not present

## 2020-08-28 DIAGNOSIS — M1990 Unspecified osteoarthritis, unspecified site: Secondary | ICD-10-CM | POA: Diagnosis not present

## 2020-08-28 DIAGNOSIS — E039 Hypothyroidism, unspecified: Secondary | ICD-10-CM | POA: Diagnosis not present

## 2020-08-28 DIAGNOSIS — E785 Hyperlipidemia, unspecified: Secondary | ICD-10-CM | POA: Diagnosis not present

## 2020-08-28 DIAGNOSIS — Z515 Encounter for palliative care: Secondary | ICD-10-CM | POA: Diagnosis not present

## 2020-08-28 DIAGNOSIS — G9009 Other idiopathic peripheral autonomic neuropathy: Secondary | ICD-10-CM | POA: Diagnosis not present

## 2020-08-28 DIAGNOSIS — I4891 Unspecified atrial fibrillation: Secondary | ICD-10-CM | POA: Diagnosis not present

## 2020-08-28 DIAGNOSIS — I110 Hypertensive heart disease with heart failure: Secondary | ICD-10-CM | POA: Diagnosis not present

## 2020-08-28 DIAGNOSIS — I10 Essential (primary) hypertension: Secondary | ICD-10-CM | POA: Diagnosis not present

## 2020-08-31 DIAGNOSIS — D649 Anemia, unspecified: Secondary | ICD-10-CM | POA: Diagnosis not present

## 2020-08-31 DIAGNOSIS — I10 Essential (primary) hypertension: Secondary | ICD-10-CM | POA: Diagnosis not present

## 2020-08-31 DIAGNOSIS — I110 Hypertensive heart disease with heart failure: Secondary | ICD-10-CM | POA: Diagnosis not present

## 2020-08-31 DIAGNOSIS — G9009 Other idiopathic peripheral autonomic neuropathy: Secondary | ICD-10-CM | POA: Diagnosis not present

## 2020-08-31 DIAGNOSIS — E039 Hypothyroidism, unspecified: Secondary | ICD-10-CM | POA: Diagnosis not present

## 2020-08-31 DIAGNOSIS — E785 Hyperlipidemia, unspecified: Secondary | ICD-10-CM | POA: Diagnosis not present

## 2020-09-08 DIAGNOSIS — G9009 Other idiopathic peripheral autonomic neuropathy: Secondary | ICD-10-CM | POA: Diagnosis not present

## 2020-09-08 DIAGNOSIS — E039 Hypothyroidism, unspecified: Secondary | ICD-10-CM | POA: Diagnosis not present

## 2020-09-08 DIAGNOSIS — D649 Anemia, unspecified: Secondary | ICD-10-CM | POA: Diagnosis not present

## 2020-09-08 DIAGNOSIS — I10 Essential (primary) hypertension: Secondary | ICD-10-CM | POA: Diagnosis not present

## 2020-09-08 DIAGNOSIS — I110 Hypertensive heart disease with heart failure: Secondary | ICD-10-CM | POA: Diagnosis not present

## 2020-09-08 DIAGNOSIS — E785 Hyperlipidemia, unspecified: Secondary | ICD-10-CM | POA: Diagnosis not present

## 2020-09-11 DIAGNOSIS — E039 Hypothyroidism, unspecified: Secondary | ICD-10-CM | POA: Diagnosis not present

## 2020-09-11 DIAGNOSIS — D649 Anemia, unspecified: Secondary | ICD-10-CM | POA: Diagnosis not present

## 2020-09-11 DIAGNOSIS — E785 Hyperlipidemia, unspecified: Secondary | ICD-10-CM | POA: Diagnosis not present

## 2020-09-11 DIAGNOSIS — I10 Essential (primary) hypertension: Secondary | ICD-10-CM | POA: Diagnosis not present

## 2020-09-11 DIAGNOSIS — G9009 Other idiopathic peripheral autonomic neuropathy: Secondary | ICD-10-CM | POA: Diagnosis not present

## 2020-09-11 DIAGNOSIS — I110 Hypertensive heart disease with heart failure: Secondary | ICD-10-CM | POA: Diagnosis not present

## 2020-09-15 DIAGNOSIS — E785 Hyperlipidemia, unspecified: Secondary | ICD-10-CM | POA: Diagnosis not present

## 2020-09-15 DIAGNOSIS — I10 Essential (primary) hypertension: Secondary | ICD-10-CM | POA: Diagnosis not present

## 2020-09-15 DIAGNOSIS — D649 Anemia, unspecified: Secondary | ICD-10-CM | POA: Diagnosis not present

## 2020-09-15 DIAGNOSIS — G9009 Other idiopathic peripheral autonomic neuropathy: Secondary | ICD-10-CM | POA: Diagnosis not present

## 2020-09-15 DIAGNOSIS — I110 Hypertensive heart disease with heart failure: Secondary | ICD-10-CM | POA: Diagnosis not present

## 2020-09-15 DIAGNOSIS — E039 Hypothyroidism, unspecified: Secondary | ICD-10-CM | POA: Diagnosis not present

## 2020-09-18 DIAGNOSIS — I110 Hypertensive heart disease with heart failure: Secondary | ICD-10-CM | POA: Diagnosis not present

## 2020-09-18 DIAGNOSIS — G9009 Other idiopathic peripheral autonomic neuropathy: Secondary | ICD-10-CM | POA: Diagnosis not present

## 2020-09-18 DIAGNOSIS — I10 Essential (primary) hypertension: Secondary | ICD-10-CM | POA: Diagnosis not present

## 2020-09-18 DIAGNOSIS — E039 Hypothyroidism, unspecified: Secondary | ICD-10-CM | POA: Diagnosis not present

## 2020-09-18 DIAGNOSIS — D649 Anemia, unspecified: Secondary | ICD-10-CM | POA: Diagnosis not present

## 2020-09-18 DIAGNOSIS — E785 Hyperlipidemia, unspecified: Secondary | ICD-10-CM | POA: Diagnosis not present

## 2020-09-20 DIAGNOSIS — G9009 Other idiopathic peripheral autonomic neuropathy: Secondary | ICD-10-CM | POA: Diagnosis not present

## 2020-09-20 DIAGNOSIS — E039 Hypothyroidism, unspecified: Secondary | ICD-10-CM | POA: Diagnosis not present

## 2020-09-20 DIAGNOSIS — I110 Hypertensive heart disease with heart failure: Secondary | ICD-10-CM | POA: Diagnosis not present

## 2020-09-20 DIAGNOSIS — I10 Essential (primary) hypertension: Secondary | ICD-10-CM | POA: Diagnosis not present

## 2020-09-20 DIAGNOSIS — E785 Hyperlipidemia, unspecified: Secondary | ICD-10-CM | POA: Diagnosis not present

## 2020-09-20 DIAGNOSIS — D649 Anemia, unspecified: Secondary | ICD-10-CM | POA: Diagnosis not present

## 2020-09-25 DIAGNOSIS — I110 Hypertensive heart disease with heart failure: Secondary | ICD-10-CM | POA: Diagnosis not present

## 2020-09-25 DIAGNOSIS — E039 Hypothyroidism, unspecified: Secondary | ICD-10-CM | POA: Diagnosis not present

## 2020-09-25 DIAGNOSIS — G9009 Other idiopathic peripheral autonomic neuropathy: Secondary | ICD-10-CM | POA: Diagnosis not present

## 2020-09-25 DIAGNOSIS — E785 Hyperlipidemia, unspecified: Secondary | ICD-10-CM | POA: Diagnosis not present

## 2020-09-25 DIAGNOSIS — I10 Essential (primary) hypertension: Secondary | ICD-10-CM | POA: Diagnosis not present

## 2020-09-25 DIAGNOSIS — D649 Anemia, unspecified: Secondary | ICD-10-CM | POA: Diagnosis not present

## 2020-09-27 DIAGNOSIS — I110 Hypertensive heart disease with heart failure: Secondary | ICD-10-CM | POA: Diagnosis not present

## 2020-09-27 DIAGNOSIS — I509 Heart failure, unspecified: Secondary | ICD-10-CM | POA: Diagnosis not present

## 2020-09-27 DIAGNOSIS — D649 Anemia, unspecified: Secondary | ICD-10-CM | POA: Diagnosis not present

## 2020-09-27 DIAGNOSIS — I4891 Unspecified atrial fibrillation: Secondary | ICD-10-CM | POA: Diagnosis not present

## 2020-09-27 DIAGNOSIS — Z515 Encounter for palliative care: Secondary | ICD-10-CM | POA: Diagnosis not present

## 2020-09-27 DIAGNOSIS — G9009 Other idiopathic peripheral autonomic neuropathy: Secondary | ICD-10-CM | POA: Diagnosis not present

## 2020-09-27 DIAGNOSIS — M1990 Unspecified osteoarthritis, unspecified site: Secondary | ICD-10-CM | POA: Diagnosis not present

## 2020-09-27 DIAGNOSIS — E039 Hypothyroidism, unspecified: Secondary | ICD-10-CM | POA: Diagnosis not present

## 2020-09-27 DIAGNOSIS — E785 Hyperlipidemia, unspecified: Secondary | ICD-10-CM | POA: Diagnosis not present

## 2020-09-28 ENCOUNTER — Encounter: Payer: Self-pay | Admitting: Family Medicine

## 2020-09-28 DIAGNOSIS — E785 Hyperlipidemia, unspecified: Secondary | ICD-10-CM | POA: Diagnosis not present

## 2020-09-28 DIAGNOSIS — G9009 Other idiopathic peripheral autonomic neuropathy: Secondary | ICD-10-CM | POA: Diagnosis not present

## 2020-09-28 DIAGNOSIS — E039 Hypothyroidism, unspecified: Secondary | ICD-10-CM | POA: Diagnosis not present

## 2020-09-28 DIAGNOSIS — I4891 Unspecified atrial fibrillation: Secondary | ICD-10-CM | POA: Diagnosis not present

## 2020-09-28 DIAGNOSIS — I110 Hypertensive heart disease with heart failure: Secondary | ICD-10-CM | POA: Diagnosis not present

## 2020-09-28 DIAGNOSIS — D649 Anemia, unspecified: Secondary | ICD-10-CM | POA: Diagnosis not present

## 2020-09-28 NOTE — Telephone Encounter (Signed)
Called vm message lfet with that appointment date and time. And call back number

## 2020-09-30 DIAGNOSIS — I4891 Unspecified atrial fibrillation: Secondary | ICD-10-CM | POA: Diagnosis not present

## 2020-09-30 DIAGNOSIS — E039 Hypothyroidism, unspecified: Secondary | ICD-10-CM | POA: Diagnosis not present

## 2020-09-30 DIAGNOSIS — I110 Hypertensive heart disease with heart failure: Secondary | ICD-10-CM | POA: Diagnosis not present

## 2020-09-30 DIAGNOSIS — D649 Anemia, unspecified: Secondary | ICD-10-CM | POA: Diagnosis not present

## 2020-09-30 DIAGNOSIS — G9009 Other idiopathic peripheral autonomic neuropathy: Secondary | ICD-10-CM | POA: Diagnosis not present

## 2020-09-30 DIAGNOSIS — E785 Hyperlipidemia, unspecified: Secondary | ICD-10-CM | POA: Diagnosis not present

## 2020-10-05 DIAGNOSIS — I110 Hypertensive heart disease with heart failure: Secondary | ICD-10-CM | POA: Diagnosis not present

## 2020-10-05 DIAGNOSIS — D649 Anemia, unspecified: Secondary | ICD-10-CM | POA: Diagnosis not present

## 2020-10-05 DIAGNOSIS — E039 Hypothyroidism, unspecified: Secondary | ICD-10-CM | POA: Diagnosis not present

## 2020-10-05 DIAGNOSIS — E785 Hyperlipidemia, unspecified: Secondary | ICD-10-CM | POA: Diagnosis not present

## 2020-10-05 DIAGNOSIS — I4891 Unspecified atrial fibrillation: Secondary | ICD-10-CM | POA: Diagnosis not present

## 2020-10-05 DIAGNOSIS — G9009 Other idiopathic peripheral autonomic neuropathy: Secondary | ICD-10-CM | POA: Diagnosis not present

## 2020-10-06 ENCOUNTER — Telehealth: Payer: TRICARE For Life (TFL) | Admitting: Family Medicine

## 2020-10-11 DIAGNOSIS — I110 Hypertensive heart disease with heart failure: Secondary | ICD-10-CM | POA: Diagnosis not present

## 2020-10-11 DIAGNOSIS — E785 Hyperlipidemia, unspecified: Secondary | ICD-10-CM | POA: Diagnosis not present

## 2020-10-11 DIAGNOSIS — D649 Anemia, unspecified: Secondary | ICD-10-CM | POA: Diagnosis not present

## 2020-10-11 DIAGNOSIS — I4891 Unspecified atrial fibrillation: Secondary | ICD-10-CM | POA: Diagnosis not present

## 2020-10-11 DIAGNOSIS — E039 Hypothyroidism, unspecified: Secondary | ICD-10-CM | POA: Diagnosis not present

## 2020-10-11 DIAGNOSIS — G9009 Other idiopathic peripheral autonomic neuropathy: Secondary | ICD-10-CM | POA: Diagnosis not present

## 2020-10-16 ENCOUNTER — Telehealth: Payer: TRICARE For Life (TFL) | Admitting: Family Medicine

## 2020-10-17 DIAGNOSIS — I110 Hypertensive heart disease with heart failure: Secondary | ICD-10-CM | POA: Diagnosis not present

## 2020-10-17 DIAGNOSIS — I4891 Unspecified atrial fibrillation: Secondary | ICD-10-CM | POA: Diagnosis not present

## 2020-10-17 DIAGNOSIS — E039 Hypothyroidism, unspecified: Secondary | ICD-10-CM | POA: Diagnosis not present

## 2020-10-17 DIAGNOSIS — G9009 Other idiopathic peripheral autonomic neuropathy: Secondary | ICD-10-CM | POA: Diagnosis not present

## 2020-10-17 DIAGNOSIS — E785 Hyperlipidemia, unspecified: Secondary | ICD-10-CM | POA: Diagnosis not present

## 2020-10-17 DIAGNOSIS — D649 Anemia, unspecified: Secondary | ICD-10-CM | POA: Diagnosis not present

## 2020-10-18 DIAGNOSIS — I110 Hypertensive heart disease with heart failure: Secondary | ICD-10-CM | POA: Diagnosis not present

## 2020-10-18 DIAGNOSIS — E785 Hyperlipidemia, unspecified: Secondary | ICD-10-CM | POA: Diagnosis not present

## 2020-10-18 DIAGNOSIS — E039 Hypothyroidism, unspecified: Secondary | ICD-10-CM | POA: Diagnosis not present

## 2020-10-18 DIAGNOSIS — D649 Anemia, unspecified: Secondary | ICD-10-CM | POA: Diagnosis not present

## 2020-10-18 DIAGNOSIS — G9009 Other idiopathic peripheral autonomic neuropathy: Secondary | ICD-10-CM | POA: Diagnosis not present

## 2020-10-18 DIAGNOSIS — I4891 Unspecified atrial fibrillation: Secondary | ICD-10-CM | POA: Diagnosis not present

## 2020-10-19 DIAGNOSIS — D649 Anemia, unspecified: Secondary | ICD-10-CM | POA: Diagnosis not present

## 2020-10-19 DIAGNOSIS — E039 Hypothyroidism, unspecified: Secondary | ICD-10-CM | POA: Diagnosis not present

## 2020-10-19 DIAGNOSIS — G9009 Other idiopathic peripheral autonomic neuropathy: Secondary | ICD-10-CM | POA: Diagnosis not present

## 2020-10-19 DIAGNOSIS — E785 Hyperlipidemia, unspecified: Secondary | ICD-10-CM | POA: Diagnosis not present

## 2020-10-19 DIAGNOSIS — I110 Hypertensive heart disease with heart failure: Secondary | ICD-10-CM | POA: Diagnosis not present

## 2020-10-19 DIAGNOSIS — I4891 Unspecified atrial fibrillation: Secondary | ICD-10-CM | POA: Diagnosis not present

## 2020-10-23 DIAGNOSIS — G9009 Other idiopathic peripheral autonomic neuropathy: Secondary | ICD-10-CM | POA: Diagnosis not present

## 2020-10-23 DIAGNOSIS — I110 Hypertensive heart disease with heart failure: Secondary | ICD-10-CM | POA: Diagnosis not present

## 2020-10-23 DIAGNOSIS — E039 Hypothyroidism, unspecified: Secondary | ICD-10-CM | POA: Diagnosis not present

## 2020-10-23 DIAGNOSIS — E785 Hyperlipidemia, unspecified: Secondary | ICD-10-CM | POA: Diagnosis not present

## 2020-10-23 DIAGNOSIS — D649 Anemia, unspecified: Secondary | ICD-10-CM | POA: Diagnosis not present

## 2020-10-23 DIAGNOSIS — I4891 Unspecified atrial fibrillation: Secondary | ICD-10-CM | POA: Diagnosis not present

## 2020-10-26 DIAGNOSIS — I110 Hypertensive heart disease with heart failure: Secondary | ICD-10-CM | POA: Diagnosis not present

## 2020-10-26 DIAGNOSIS — E039 Hypothyroidism, unspecified: Secondary | ICD-10-CM | POA: Diagnosis not present

## 2020-10-26 DIAGNOSIS — D649 Anemia, unspecified: Secondary | ICD-10-CM | POA: Diagnosis not present

## 2020-10-26 DIAGNOSIS — G9009 Other idiopathic peripheral autonomic neuropathy: Secondary | ICD-10-CM | POA: Diagnosis not present

## 2020-10-26 DIAGNOSIS — I4891 Unspecified atrial fibrillation: Secondary | ICD-10-CM | POA: Diagnosis not present

## 2020-10-26 DIAGNOSIS — E785 Hyperlipidemia, unspecified: Secondary | ICD-10-CM | POA: Diagnosis not present

## 2020-10-28 DIAGNOSIS — G9009 Other idiopathic peripheral autonomic neuropathy: Secondary | ICD-10-CM | POA: Diagnosis not present

## 2020-10-28 DIAGNOSIS — M1990 Unspecified osteoarthritis, unspecified site: Secondary | ICD-10-CM | POA: Diagnosis not present

## 2020-10-28 DIAGNOSIS — I509 Heart failure, unspecified: Secondary | ICD-10-CM | POA: Diagnosis not present

## 2020-10-28 DIAGNOSIS — E039 Hypothyroidism, unspecified: Secondary | ICD-10-CM | POA: Diagnosis not present

## 2020-10-28 DIAGNOSIS — I4891 Unspecified atrial fibrillation: Secondary | ICD-10-CM | POA: Diagnosis not present

## 2020-10-28 DIAGNOSIS — Z515 Encounter for palliative care: Secondary | ICD-10-CM | POA: Diagnosis not present

## 2020-10-28 DIAGNOSIS — I110 Hypertensive heart disease with heart failure: Secondary | ICD-10-CM | POA: Diagnosis not present

## 2020-10-28 DIAGNOSIS — E785 Hyperlipidemia, unspecified: Secondary | ICD-10-CM | POA: Diagnosis not present

## 2020-10-28 DIAGNOSIS — D649 Anemia, unspecified: Secondary | ICD-10-CM | POA: Diagnosis not present

## 2020-10-30 DIAGNOSIS — D649 Anemia, unspecified: Secondary | ICD-10-CM | POA: Diagnosis not present

## 2020-10-30 DIAGNOSIS — E039 Hypothyroidism, unspecified: Secondary | ICD-10-CM | POA: Diagnosis not present

## 2020-10-30 DIAGNOSIS — G9009 Other idiopathic peripheral autonomic neuropathy: Secondary | ICD-10-CM | POA: Diagnosis not present

## 2020-10-30 DIAGNOSIS — E785 Hyperlipidemia, unspecified: Secondary | ICD-10-CM | POA: Diagnosis not present

## 2020-10-30 DIAGNOSIS — I509 Heart failure, unspecified: Secondary | ICD-10-CM | POA: Diagnosis not present

## 2020-10-30 DIAGNOSIS — I110 Hypertensive heart disease with heart failure: Secondary | ICD-10-CM | POA: Diagnosis not present

## 2020-11-02 ENCOUNTER — Telehealth (INDEPENDENT_AMBULATORY_CARE_PROVIDER_SITE_OTHER): Payer: Medicare Other | Admitting: Family Medicine

## 2020-11-02 ENCOUNTER — Other Ambulatory Visit: Payer: Self-pay

## 2020-11-02 ENCOUNTER — Encounter: Payer: Self-pay | Admitting: Family Medicine

## 2020-11-02 DIAGNOSIS — G9009 Other idiopathic peripheral autonomic neuropathy: Secondary | ICD-10-CM | POA: Diagnosis not present

## 2020-11-02 DIAGNOSIS — I4811 Longstanding persistent atrial fibrillation: Secondary | ICD-10-CM

## 2020-11-02 DIAGNOSIS — Z515 Encounter for palliative care: Secondary | ICD-10-CM | POA: Diagnosis not present

## 2020-11-02 DIAGNOSIS — R5381 Other malaise: Secondary | ICD-10-CM

## 2020-11-02 DIAGNOSIS — E785 Hyperlipidemia, unspecified: Secondary | ICD-10-CM | POA: Diagnosis not present

## 2020-11-02 DIAGNOSIS — I10 Essential (primary) hypertension: Secondary | ICD-10-CM

## 2020-11-02 DIAGNOSIS — D649 Anemia, unspecified: Secondary | ICD-10-CM | POA: Diagnosis not present

## 2020-11-02 DIAGNOSIS — I110 Hypertensive heart disease with heart failure: Secondary | ICD-10-CM | POA: Diagnosis not present

## 2020-11-02 DIAGNOSIS — M158 Other polyosteoarthritis: Secondary | ICD-10-CM | POA: Diagnosis not present

## 2020-11-02 DIAGNOSIS — E039 Hypothyroidism, unspecified: Secondary | ICD-10-CM | POA: Diagnosis not present

## 2020-11-02 DIAGNOSIS — I509 Heart failure, unspecified: Secondary | ICD-10-CM | POA: Diagnosis not present

## 2020-11-05 NOTE — Assessment & Plan Note (Signed)
Monitor intermittently and report any concerns.

## 2020-11-05 NOTE — Progress Notes (Signed)
Virtual Visit via Video Note  I connected with Regena N Brammer on 11/05/20 at  2:40 PM EST by a video enabled telemedicine application and verified that I am speaking with the correct person using two identifiers.  Location: Patient: home, patient, daughter and provider in visit Provider: office   I discussed the limitations of evaluation and management by telemedicine and the availability of in person appointments. The patient expressed understanding and agreed to proceed. S Chism, CMA was able to get the patient set up on a video visit.   Subjective:    Patient ID: Gina Reilly, female    DOB: Aug 06, 1924, 87 y.o.   MRN: [id number]  Chief Complaint  Patient presents with  . Follow-up    HPI Patient is in today for follow up on chronic medical concerns. She continues to be well cared for at home by her daughters and the hospice team. She has no acute complaints. No recent febrile illness or hospitalizations. She continues to be largely bedbound. Her greatest complaint is pain. She notes she has daily arthritic pain diffusely but on days when the weather is changing her pain is overwhelming. They are using a combination of Tylenol, tramadol and topical treatments and that does manage her pain adequately most days. Except for when the weather changes. Denies CP/palp/HA/congestion/fevers/GI or GU c/o. Taking meds as prescribed  Past Medical History:  Diagnosis Date  . 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 Anemia   . Apnea, sleep 04/24/2012  . Arthritis    osteo  . Bronchitis 12/25/2011  . Chicken pox as a child  . Chronic atrial fibrillation ([place])    a. 07/2010 TEE/DCCV:  EF 60-65% mild MR.; b. Recurrent Afib--> Amiodarone;  Pradaxa (CHA2DS2VASc = 4);  c. Amio d/c'd 2/2 toxicity - now chronic rate-controlled afib.  . Constipation 11/22/2014  . Contusion of leg, left 01/16/2012  .  GERD (gastroesophageal reflux disease)   . History of echocardiogram    a. 07/2015 Echo: EF 60-65%, Ao sclerosis, mild MR, mildly dil LA/RV/RA, mild Tr, PASP 83mmHg.  Marland Kitchen Hyperglycemia 01/10/2010   Qualifier: Diagnosis of  By: Niel Hummer MD, [place] Hyperlipidemia   . Hypertension   . Hyperthyroidism 02/09/2009   Qualifier: Diagnosis of  By: Niel Hummer MD, [place] Hypoxia 04/14/2012  . Intermittent claudication ([place]) 05/22/2012  . Measles as a child  . Medicare annual wellness visit, subsequent 11/27/2014   Follows with pulmonology, Dr Gwenette Greet Follow with cardiology, Dr Rance Muir with Dr Matthew Saras Does not participate in [facility], pap or colonoscopy screening at this time.  . Mumps as a child  . Obese   . Ovarian cyst   . Pelvic mass in female   . Peripheral neuropathy 02/26/2012  . Precancerous skin lesion   . Skin cancer    left temple  . Thyroid disease    hypothroidism  . Vitamin D deficiency     Past Surgical History:  Procedure Laterality Date  . ABDOMINAL HYSTERECTOMY    . CATARACT EXTRACTION     Dr. Satira Sark  . SKIN BIOPSY     left temple. cancerous  . TOTAL HIP ARTHROPLASTY  2003   left    Family History  Problem Relation Age of Onset  . Stroke Mother   . Prostate cancer Father        prostate  .  Hypertension Brother   . Cancer Maternal Grandmother   . Hypertension Brother   . Heart disease Brother   . Obesity Brother   . Hypertension Brother   . Hyperlipidemia Brother   . Heart disease Brother   . Kidney disease Brother   . Cancer Daughter        kidney- spine and blood stream  . Heart disease Son        valvular heart disease  . Prostate cancer Brother     Social History   Socioeconomic History  . Marital status: Widowed    Spouse name: Not on file  . Number of children: 78  . Years of education: Not on file  . Highest education level: Not on file  Occupational History  . Occupation: retired    Fish farm manager: RETIRED  Tobacco Use  . Smoking  status: Former Smoker    Packs/day: 0.30    Years: 5.00    Pack years: 1.50    Types: Cigarettes    Quit date: 10/28/1973    Years since quitting: 47.0  . Smokeless tobacco: Never Used  Substance and Sexual Activity  . Alcohol use: Yes    Comment: socially  . Drug use: No  . Sexual activity: Not on file  Other Topics Concern  . Not on file  Social History Narrative  . Not on file   Social Determinants of Health   Financial Resource Strain: Not on file  Food Insecurity: Not on file  Transportation Needs: Not on file  Physical Activity: Not on file  Stress: Not on file  Social Connections: Not on file  Intimate Partner Violence: Not on file    Outpatient Medications Prior to Visit  Medication Sig Dispense Refill  . acetaminophen (TYLENOL) 650 MG CR tablet Take 650 mg by mouth every 6 (six) hours as needed for pain.    . bisacodyl (DULCOLAX) 10 MG suppository Place 1 suppository (10 mg total) rectally daily as needed for moderate constipation. 12 suppository 0  . Cholecalciferol (VITAMIN D) 50 MCG (2000 UT) CAPS Take 2,000 Units by mouth daily.    Marland Kitchen diltiazem (CARDIZEM CD) 360 MG 24 hr capsule TAKE 1 CAPSULE DAILY (NEED TO MAKE APPOINTMENT WITH PROVIDER FOR FURTHER REFILLS) 90 capsule 1  . ELIQUIS 5 MG TABS tablet TAKE 1 TABLET TWICE A DAY (Patient taking differently: Take 5 mg by mouth 2 (two) times daily.) 180 tablet 3  . furosemide (LASIX) 20 MG tablet Take 1 tablet (20 mg total) by mouth daily as needed for fluid or edema. 30 tablet 5  . LORazepam (ATIVAN) 0.5 MG tablet Take 0.5 mg by mouth every 4 (four) hours as needed.    Marland Kitchen losartan (COZAAR) 25 MG tablet TAKE 1 TABLET DAILY (PATIENT NEEDS TO MAKE APPOINTMENT WITH PROVIDER FOR FURTHER REFILLS) 90 tablet 1  . Morphine Sulfate (MORPHINE CONCENTRATE) 10 mg / 0.5 ml concentrated solution SMARTSIG:5 Milligram(s) By Mouth Every 4 Hours PRN    . nystatin (MYCOSTATIN/NYSTOP) powder Apply 1 application topically 2 (two) times daily.  15 g 0  . ondansetron (ZOFRAN) 4 MG tablet Take 1 tablet (4 mg total) by mouth every 6 (six) hours as needed for nausea. 20 tablet 0  . polyethylene glycol (MIRALAX / GLYCOLAX) 17 g packet Take 17 g by mouth daily as needed for mild constipation. 14 each 0  . rOPINIRole (REQUIP XL) 4 MG 24 hr tablet Take 1 tablet (4 mg total) by mouth at bedtime. 30 tablet 0  .  senna (SENOKOT) 8.6 MG TABS tablet Take 2 tablets (17.2 mg total) by mouth at bedtime. 120 tablet 0  . traMADol (ULTRAM) 50 MG tablet Take 1 tablet (50 mg total) by mouth every 8 (eight) hours as needed for moderate pain.    . vitamin B-12 100 MCG tablet Take 1 tablet (100 mcg total) by mouth daily. 30 tablet 0   No facility-administered medications prior to visit.    Allergies  Allergen Reactions  . Amiodarone Other (See Comments)    Amiodarone pulmonary toxicity.  . Aspercreme [Trolamine Salicylate] Other (See Comments)    Pt states it caused redness like it was burning her skin.    Review of Systems  Constitutional: Positive for malaise/fatigue. Negative for fever.  HENT: Negative for congestion.   Eyes: Negative for blurred vision.  Respiratory: Positive for shortness of breath.   Cardiovascular: Negative for chest pain, palpitations and leg swelling.  Gastrointestinal: Negative for abdominal pain, blood in stool and nausea.  Genitourinary: Negative for dysuria and frequency.  Musculoskeletal: Positive for back pain, joint pain and myalgias. Negative for falls.  Skin: Negative for rash.  Neurological: Negative for dizziness, loss of consciousness and headaches.  Endo/Heme/Allergies: Negative for environmental allergies.  Psychiatric/Behavioral: Negative for depression. The patient is not nervous/anxious.        Objective:    Physical Exam Constitutional:      Appearance: Normal appearance. She is not ill-appearing.  HENT:     [place]: Normocephalic and atraumatic.     Right Ear: There is no impacted cerumen.      Left Ear: There is no impacted cerumen.     Nose: Nose normal.  Eyes:     General:        Right eye: No discharge.        Left eye: Discharge present. Pulmonary:     Effort: Pulmonary effort is normal.  Neurological:     Mental Status: She is alert and oriented to person, place, and time.  Psychiatric:        Behavior: Behavior normal.     LMP 12/25/2011  Wt Readings from Last 3 Encounters:  03/16/20 248 lb (112.5 kg)  12/02/19 230 lb (104.3 kg)  10/28/19 230 lb (104.3 kg)    Diabetic Foot Exam - Simple   No data filed    Lab Results  Component Value Date   WBC 8.1 03/15/2020   HGB 13.1 03/15/2020   HCT 41.6 03/15/2020   PLT 211 03/15/2020   GLUCOSE 129 (H) 03/15/2020   CHOL 151 12/02/2019   TRIG 128.0 12/02/2019   HDL 61.30 12/02/2019   LDLDIRECT 128.4 02/09/2009   LDLCALC 64 12/02/2019   ALT 18 03/12/2020   AST 20 03/12/2020   NA 136 03/15/2020   K 4.7 03/15/2020   CL 102 03/15/2020   CREATININE 0.72 03/15/2020   BUN 11 03/15/2020   CO2 25 03/15/2020   TSH 1.991 03/11/2020   INR 1.32 08/17/2010   HGBA1C 6.2 (H) 03/13/2020    Lab Results  Component Value Date   TSH 1.991 03/11/2020   Lab Results  Component Value Date   WBC 8.1 03/15/2020   HGB 13.1 03/15/2020   HCT 41.6 03/15/2020   MCV 107.8 (H) 03/15/2020   PLT 211 03/15/2020   Lab Results  Component Value Date   NA 136 03/15/2020   K 4.7 03/15/2020   CO2 25 03/15/2020   GLUCOSE 129 (H) 03/15/2020   BUN 11 03/15/2020   CREATININE 0.72  03/15/2020   BILITOT 1.1 03/12/2020   ALKPHOS 46 03/12/2020   AST 20 03/12/2020   ALT 18 03/12/2020   PROT 5.8 (L) 03/12/2020   ALBUMIN 3.4 (L) 03/15/2020   CALCIUM 9.3 03/15/2020   ANIONGAP 9 03/15/2020   GFR 63.14 12/02/2019   Lab Results  Component Value Date   CHOL 151 12/02/2019   Lab Results  Component Value Date   HDL 61.30 12/02/2019   Lab Results  Component Value Date   LDLCALC 64 12/02/2019   Lab Results  Component Value Date    TRIG 128.0 12/02/2019   Lab Results  Component Value Date   CHOLHDL 2 12/02/2019   Lab Results  Component Value Date   HGBA1C 6.2 (H) 03/13/2020       Assessment & Plan:   Problem List Items Addressed This Visit    ATRIAL FIBRILLATION    Asymptomatic no changes.       Osteoarthritis    Struggles with daily pain but flares especially when the weather is changing. Continue Tylenol tid and topical Lidocaine gel and consider CBD on bad days.      Debility    She spends most of her time in bed with care from her family. She is doing well most days.       Hypertension    Monitor intermittently and report any concerns.      Hospice care    She continues to be well cared for at home with Hospice and the care of her daughters, no acute concerns.          I am having Vertie N. Quizhpi maintain her Eliquis, Vitamin D, furosemide, rOPINIRole, bisacodyl, nystatin, ondansetron, polyethylene glycol, senna, cyanocobalamin, acetaminophen, LORazepam, morphine CONCENTRATE, diltiazem, losartan, and traMADol.  No orders of the defined types were placed in this encounter.    I discussed the assessment and treatment plan with the patient. The patient was provided an opportunity to ask questions and all were answered. The patient agreed with the plan and demonstrated an understanding of the instructions.   The patient was advised to call back or seek an in-person evaluation if the symptoms worsen or if the condition fails to improve as anticipated.  I provided 20 minutes of non-face-to-face time during this encounter.   Penni Homans, MD

## 2020-11-05 NOTE — Assessment & Plan Note (Signed)
She spends most of her time in bed with care from her family. She is doing well most days.

## 2020-11-05 NOTE — Assessment & Plan Note (Signed)
Asymptomatic no changes 

## 2020-11-05 NOTE — Assessment & Plan Note (Signed)
Struggles with daily pain but flares especially when the weather is changing. Continue Tylenol tid and topical Lidocaine gel and consider CBD on bad days.

## 2020-11-05 NOTE — Assessment & Plan Note (Signed)
She continues to be well cared for at home with Hospice and the care of her daughters, no acute concerns.

## 2020-11-09 DIAGNOSIS — E785 Hyperlipidemia, unspecified: Secondary | ICD-10-CM | POA: Diagnosis not present

## 2020-11-09 DIAGNOSIS — I110 Hypertensive heart disease with heart failure: Secondary | ICD-10-CM | POA: Diagnosis not present

## 2020-11-09 DIAGNOSIS — I509 Heart failure, unspecified: Secondary | ICD-10-CM | POA: Diagnosis not present

## 2020-11-09 DIAGNOSIS — D649 Anemia, unspecified: Secondary | ICD-10-CM | POA: Diagnosis not present

## 2020-11-09 DIAGNOSIS — E039 Hypothyroidism, unspecified: Secondary | ICD-10-CM | POA: Diagnosis not present

## 2020-11-09 DIAGNOSIS — G9009 Other idiopathic peripheral autonomic neuropathy: Secondary | ICD-10-CM | POA: Diagnosis not present

## 2020-11-13 ENCOUNTER — Other Ambulatory Visit: Payer: Self-pay | Admitting: Cardiology

## 2020-11-14 DIAGNOSIS — I509 Heart failure, unspecified: Secondary | ICD-10-CM | POA: Diagnosis not present

## 2020-11-14 DIAGNOSIS — D649 Anemia, unspecified: Secondary | ICD-10-CM | POA: Diagnosis not present

## 2020-11-14 DIAGNOSIS — E785 Hyperlipidemia, unspecified: Secondary | ICD-10-CM | POA: Diagnosis not present

## 2020-11-14 DIAGNOSIS — G9009 Other idiopathic peripheral autonomic neuropathy: Secondary | ICD-10-CM | POA: Diagnosis not present

## 2020-11-14 DIAGNOSIS — I110 Hypertensive heart disease with heart failure: Secondary | ICD-10-CM | POA: Diagnosis not present

## 2020-11-14 DIAGNOSIS — E039 Hypothyroidism, unspecified: Secondary | ICD-10-CM | POA: Diagnosis not present

## 2020-11-17 DIAGNOSIS — E785 Hyperlipidemia, unspecified: Secondary | ICD-10-CM | POA: Diagnosis not present

## 2020-11-17 DIAGNOSIS — D649 Anemia, unspecified: Secondary | ICD-10-CM | POA: Diagnosis not present

## 2020-11-17 DIAGNOSIS — E039 Hypothyroidism, unspecified: Secondary | ICD-10-CM | POA: Diagnosis not present

## 2020-11-17 DIAGNOSIS — I509 Heart failure, unspecified: Secondary | ICD-10-CM | POA: Diagnosis not present

## 2020-11-17 DIAGNOSIS — I110 Hypertensive heart disease with heart failure: Secondary | ICD-10-CM | POA: Diagnosis not present

## 2020-11-17 DIAGNOSIS — G9009 Other idiopathic peripheral autonomic neuropathy: Secondary | ICD-10-CM | POA: Diagnosis not present

## 2020-11-20 DIAGNOSIS — E039 Hypothyroidism, unspecified: Secondary | ICD-10-CM | POA: Diagnosis not present

## 2020-11-20 DIAGNOSIS — G9009 Other idiopathic peripheral autonomic neuropathy: Secondary | ICD-10-CM | POA: Diagnosis not present

## 2020-11-20 DIAGNOSIS — I110 Hypertensive heart disease with heart failure: Secondary | ICD-10-CM | POA: Diagnosis not present

## 2020-11-20 DIAGNOSIS — D649 Anemia, unspecified: Secondary | ICD-10-CM | POA: Diagnosis not present

## 2020-11-20 DIAGNOSIS — I509 Heart failure, unspecified: Secondary | ICD-10-CM | POA: Diagnosis not present

## 2020-11-20 DIAGNOSIS — E785 Hyperlipidemia, unspecified: Secondary | ICD-10-CM | POA: Diagnosis not present

## 2020-11-21 DIAGNOSIS — I110 Hypertensive heart disease with heart failure: Secondary | ICD-10-CM | POA: Diagnosis not present

## 2020-11-21 DIAGNOSIS — G9009 Other idiopathic peripheral autonomic neuropathy: Secondary | ICD-10-CM | POA: Diagnosis not present

## 2020-11-21 DIAGNOSIS — E039 Hypothyroidism, unspecified: Secondary | ICD-10-CM | POA: Diagnosis not present

## 2020-11-21 DIAGNOSIS — E785 Hyperlipidemia, unspecified: Secondary | ICD-10-CM | POA: Diagnosis not present

## 2020-11-21 DIAGNOSIS — I509 Heart failure, unspecified: Secondary | ICD-10-CM | POA: Diagnosis not present

## 2020-11-21 DIAGNOSIS — D649 Anemia, unspecified: Secondary | ICD-10-CM | POA: Diagnosis not present

## 2020-11-27 ENCOUNTER — Other Ambulatory Visit: Payer: Self-pay | Admitting: Cardiology

## 2020-11-27 DIAGNOSIS — D649 Anemia, unspecified: Secondary | ICD-10-CM | POA: Diagnosis not present

## 2020-11-27 DIAGNOSIS — G9009 Other idiopathic peripheral autonomic neuropathy: Secondary | ICD-10-CM | POA: Diagnosis not present

## 2020-11-27 DIAGNOSIS — E785 Hyperlipidemia, unspecified: Secondary | ICD-10-CM | POA: Diagnosis not present

## 2020-11-27 DIAGNOSIS — I110 Hypertensive heart disease with heart failure: Secondary | ICD-10-CM | POA: Diagnosis not present

## 2020-11-27 DIAGNOSIS — I509 Heart failure, unspecified: Secondary | ICD-10-CM | POA: Diagnosis not present

## 2020-11-27 DIAGNOSIS — E039 Hypothyroidism, unspecified: Secondary | ICD-10-CM | POA: Diagnosis not present

## 2020-11-28 DIAGNOSIS — G9009 Other idiopathic peripheral autonomic neuropathy: Secondary | ICD-10-CM | POA: Diagnosis not present

## 2020-11-28 DIAGNOSIS — Z515 Encounter for palliative care: Secondary | ICD-10-CM | POA: Diagnosis not present

## 2020-11-28 DIAGNOSIS — I110 Hypertensive heart disease with heart failure: Secondary | ICD-10-CM | POA: Diagnosis not present

## 2020-11-28 DIAGNOSIS — D649 Anemia, unspecified: Secondary | ICD-10-CM | POA: Diagnosis not present

## 2020-11-28 DIAGNOSIS — E039 Hypothyroidism, unspecified: Secondary | ICD-10-CM | POA: Diagnosis not present

## 2020-11-28 DIAGNOSIS — I509 Heart failure, unspecified: Secondary | ICD-10-CM | POA: Diagnosis not present

## 2020-11-28 DIAGNOSIS — I4891 Unspecified atrial fibrillation: Secondary | ICD-10-CM | POA: Diagnosis not present

## 2020-11-28 DIAGNOSIS — M1990 Unspecified osteoarthritis, unspecified site: Secondary | ICD-10-CM | POA: Diagnosis not present

## 2020-11-28 DIAGNOSIS — E785 Hyperlipidemia, unspecified: Secondary | ICD-10-CM | POA: Diagnosis not present

## 2020-12-05 DIAGNOSIS — G9009 Other idiopathic peripheral autonomic neuropathy: Secondary | ICD-10-CM | POA: Diagnosis not present

## 2020-12-05 DIAGNOSIS — I110 Hypertensive heart disease with heart failure: Secondary | ICD-10-CM | POA: Diagnosis not present

## 2020-12-05 DIAGNOSIS — D649 Anemia, unspecified: Secondary | ICD-10-CM | POA: Diagnosis not present

## 2020-12-05 DIAGNOSIS — E785 Hyperlipidemia, unspecified: Secondary | ICD-10-CM | POA: Diagnosis not present

## 2020-12-05 DIAGNOSIS — I509 Heart failure, unspecified: Secondary | ICD-10-CM | POA: Diagnosis not present

## 2020-12-05 DIAGNOSIS — E039 Hypothyroidism, unspecified: Secondary | ICD-10-CM | POA: Diagnosis not present

## 2020-12-07 DIAGNOSIS — I110 Hypertensive heart disease with heart failure: Secondary | ICD-10-CM | POA: Diagnosis not present

## 2020-12-07 DIAGNOSIS — G9009 Other idiopathic peripheral autonomic neuropathy: Secondary | ICD-10-CM | POA: Diagnosis not present

## 2020-12-07 DIAGNOSIS — E039 Hypothyroidism, unspecified: Secondary | ICD-10-CM | POA: Diagnosis not present

## 2020-12-07 DIAGNOSIS — D649 Anemia, unspecified: Secondary | ICD-10-CM | POA: Diagnosis not present

## 2020-12-07 DIAGNOSIS — I509 Heart failure, unspecified: Secondary | ICD-10-CM | POA: Diagnosis not present

## 2020-12-07 DIAGNOSIS — E785 Hyperlipidemia, unspecified: Secondary | ICD-10-CM | POA: Diagnosis not present

## 2020-12-12 DIAGNOSIS — I110 Hypertensive heart disease with heart failure: Secondary | ICD-10-CM | POA: Diagnosis not present

## 2020-12-12 DIAGNOSIS — E039 Hypothyroidism, unspecified: Secondary | ICD-10-CM | POA: Diagnosis not present

## 2020-12-12 DIAGNOSIS — D649 Anemia, unspecified: Secondary | ICD-10-CM | POA: Diagnosis not present

## 2020-12-12 DIAGNOSIS — E785 Hyperlipidemia, unspecified: Secondary | ICD-10-CM | POA: Diagnosis not present

## 2020-12-12 DIAGNOSIS — G9009 Other idiopathic peripheral autonomic neuropathy: Secondary | ICD-10-CM | POA: Diagnosis not present

## 2020-12-12 DIAGNOSIS — I509 Heart failure, unspecified: Secondary | ICD-10-CM | POA: Diagnosis not present

## 2020-12-14 DIAGNOSIS — G9009 Other idiopathic peripheral autonomic neuropathy: Secondary | ICD-10-CM | POA: Diagnosis not present

## 2020-12-14 DIAGNOSIS — E785 Hyperlipidemia, unspecified: Secondary | ICD-10-CM | POA: Diagnosis not present

## 2020-12-14 DIAGNOSIS — I509 Heart failure, unspecified: Secondary | ICD-10-CM | POA: Diagnosis not present

## 2020-12-14 DIAGNOSIS — I110 Hypertensive heart disease with heart failure: Secondary | ICD-10-CM | POA: Diagnosis not present

## 2020-12-14 DIAGNOSIS — D649 Anemia, unspecified: Secondary | ICD-10-CM | POA: Diagnosis not present

## 2020-12-14 DIAGNOSIS — E039 Hypothyroidism, unspecified: Secondary | ICD-10-CM | POA: Diagnosis not present

## 2020-12-19 DIAGNOSIS — I110 Hypertensive heart disease with heart failure: Secondary | ICD-10-CM | POA: Diagnosis not present

## 2020-12-19 DIAGNOSIS — G9009 Other idiopathic peripheral autonomic neuropathy: Secondary | ICD-10-CM | POA: Diagnosis not present

## 2020-12-19 DIAGNOSIS — I509 Heart failure, unspecified: Secondary | ICD-10-CM | POA: Diagnosis not present

## 2020-12-19 DIAGNOSIS — E039 Hypothyroidism, unspecified: Secondary | ICD-10-CM | POA: Diagnosis not present

## 2020-12-19 DIAGNOSIS — D649 Anemia, unspecified: Secondary | ICD-10-CM | POA: Diagnosis not present

## 2020-12-19 DIAGNOSIS — E785 Hyperlipidemia, unspecified: Secondary | ICD-10-CM | POA: Diagnosis not present

## 2020-12-25 DIAGNOSIS — D649 Anemia, unspecified: Secondary | ICD-10-CM | POA: Diagnosis not present

## 2020-12-25 DIAGNOSIS — G9009 Other idiopathic peripheral autonomic neuropathy: Secondary | ICD-10-CM | POA: Diagnosis not present

## 2020-12-25 DIAGNOSIS — I509 Heart failure, unspecified: Secondary | ICD-10-CM | POA: Diagnosis not present

## 2020-12-25 DIAGNOSIS — I110 Hypertensive heart disease with heart failure: Secondary | ICD-10-CM | POA: Diagnosis not present

## 2020-12-25 DIAGNOSIS — E039 Hypothyroidism, unspecified: Secondary | ICD-10-CM | POA: Diagnosis not present

## 2020-12-25 DIAGNOSIS — E785 Hyperlipidemia, unspecified: Secondary | ICD-10-CM | POA: Diagnosis not present

## 2020-12-26 DIAGNOSIS — D649 Anemia, unspecified: Secondary | ICD-10-CM | POA: Diagnosis not present

## 2020-12-26 DIAGNOSIS — E785 Hyperlipidemia, unspecified: Secondary | ICD-10-CM | POA: Diagnosis not present

## 2020-12-26 DIAGNOSIS — E039 Hypothyroidism, unspecified: Secondary | ICD-10-CM | POA: Diagnosis not present

## 2020-12-26 DIAGNOSIS — Z515 Encounter for palliative care: Secondary | ICD-10-CM | POA: Diagnosis not present

## 2020-12-26 DIAGNOSIS — I509 Heart failure, unspecified: Secondary | ICD-10-CM | POA: Diagnosis not present

## 2020-12-26 DIAGNOSIS — M1990 Unspecified osteoarthritis, unspecified site: Secondary | ICD-10-CM | POA: Diagnosis not present

## 2020-12-26 DIAGNOSIS — I4891 Unspecified atrial fibrillation: Secondary | ICD-10-CM | POA: Diagnosis not present

## 2020-12-26 DIAGNOSIS — G9009 Other idiopathic peripheral autonomic neuropathy: Secondary | ICD-10-CM | POA: Diagnosis not present

## 2020-12-26 DIAGNOSIS — I110 Hypertensive heart disease with heart failure: Secondary | ICD-10-CM | POA: Diagnosis not present

## 2021-01-01 DIAGNOSIS — G9009 Other idiopathic peripheral autonomic neuropathy: Secondary | ICD-10-CM | POA: Diagnosis not present

## 2021-01-01 DIAGNOSIS — I110 Hypertensive heart disease with heart failure: Secondary | ICD-10-CM | POA: Diagnosis not present

## 2021-01-01 DIAGNOSIS — I509 Heart failure, unspecified: Secondary | ICD-10-CM | POA: Diagnosis not present

## 2021-01-01 DIAGNOSIS — E785 Hyperlipidemia, unspecified: Secondary | ICD-10-CM | POA: Diagnosis not present

## 2021-01-01 DIAGNOSIS — D649 Anemia, unspecified: Secondary | ICD-10-CM | POA: Diagnosis not present

## 2021-01-01 DIAGNOSIS — E039 Hypothyroidism, unspecified: Secondary | ICD-10-CM | POA: Diagnosis not present

## 2021-01-02 DIAGNOSIS — I509 Heart failure, unspecified: Secondary | ICD-10-CM | POA: Diagnosis not present

## 2021-01-02 DIAGNOSIS — G9009 Other idiopathic peripheral autonomic neuropathy: Secondary | ICD-10-CM | POA: Diagnosis not present

## 2021-01-02 DIAGNOSIS — E039 Hypothyroidism, unspecified: Secondary | ICD-10-CM | POA: Diagnosis not present

## 2021-01-02 DIAGNOSIS — E785 Hyperlipidemia, unspecified: Secondary | ICD-10-CM | POA: Diagnosis not present

## 2021-01-02 DIAGNOSIS — I110 Hypertensive heart disease with heart failure: Secondary | ICD-10-CM | POA: Diagnosis not present

## 2021-01-02 DIAGNOSIS — D649 Anemia, unspecified: Secondary | ICD-10-CM | POA: Diagnosis not present

## 2021-01-05 DIAGNOSIS — E039 Hypothyroidism, unspecified: Secondary | ICD-10-CM | POA: Diagnosis not present

## 2021-01-05 DIAGNOSIS — G9009 Other idiopathic peripheral autonomic neuropathy: Secondary | ICD-10-CM | POA: Diagnosis not present

## 2021-01-05 DIAGNOSIS — I110 Hypertensive heart disease with heart failure: Secondary | ICD-10-CM | POA: Diagnosis not present

## 2021-01-05 DIAGNOSIS — I509 Heart failure, unspecified: Secondary | ICD-10-CM | POA: Diagnosis not present

## 2021-01-05 DIAGNOSIS — E785 Hyperlipidemia, unspecified: Secondary | ICD-10-CM | POA: Diagnosis not present

## 2021-01-05 DIAGNOSIS — D649 Anemia, unspecified: Secondary | ICD-10-CM | POA: Diagnosis not present

## 2021-01-09 DIAGNOSIS — I509 Heart failure, unspecified: Secondary | ICD-10-CM | POA: Diagnosis not present

## 2021-01-09 DIAGNOSIS — E039 Hypothyroidism, unspecified: Secondary | ICD-10-CM | POA: Diagnosis not present

## 2021-01-09 DIAGNOSIS — D649 Anemia, unspecified: Secondary | ICD-10-CM | POA: Diagnosis not present

## 2021-01-09 DIAGNOSIS — I110 Hypertensive heart disease with heart failure: Secondary | ICD-10-CM | POA: Diagnosis not present

## 2021-01-09 DIAGNOSIS — G9009 Other idiopathic peripheral autonomic neuropathy: Secondary | ICD-10-CM | POA: Diagnosis not present

## 2021-01-09 DIAGNOSIS — E785 Hyperlipidemia, unspecified: Secondary | ICD-10-CM | POA: Diagnosis not present

## 2021-01-16 DIAGNOSIS — D649 Anemia, unspecified: Secondary | ICD-10-CM | POA: Diagnosis not present

## 2021-01-16 DIAGNOSIS — I509 Heart failure, unspecified: Secondary | ICD-10-CM | POA: Diagnosis not present

## 2021-01-16 DIAGNOSIS — G9009 Other idiopathic peripheral autonomic neuropathy: Secondary | ICD-10-CM | POA: Diagnosis not present

## 2021-01-16 DIAGNOSIS — E785 Hyperlipidemia, unspecified: Secondary | ICD-10-CM | POA: Diagnosis not present

## 2021-01-16 DIAGNOSIS — E039 Hypothyroidism, unspecified: Secondary | ICD-10-CM | POA: Diagnosis not present

## 2021-01-16 DIAGNOSIS — I110 Hypertensive heart disease with heart failure: Secondary | ICD-10-CM | POA: Diagnosis not present

## 2021-01-23 DIAGNOSIS — E785 Hyperlipidemia, unspecified: Secondary | ICD-10-CM | POA: Diagnosis not present

## 2021-01-23 DIAGNOSIS — I509 Heart failure, unspecified: Secondary | ICD-10-CM | POA: Diagnosis not present

## 2021-01-23 DIAGNOSIS — E039 Hypothyroidism, unspecified: Secondary | ICD-10-CM | POA: Diagnosis not present

## 2021-01-23 DIAGNOSIS — D649 Anemia, unspecified: Secondary | ICD-10-CM | POA: Diagnosis not present

## 2021-01-23 DIAGNOSIS — G9009 Other idiopathic peripheral autonomic neuropathy: Secondary | ICD-10-CM | POA: Diagnosis not present

## 2021-01-23 DIAGNOSIS — I110 Hypertensive heart disease with heart failure: Secondary | ICD-10-CM | POA: Diagnosis not present

## 2021-01-24 DIAGNOSIS — I509 Heart failure, unspecified: Secondary | ICD-10-CM | POA: Diagnosis not present

## 2021-01-24 DIAGNOSIS — G9009 Other idiopathic peripheral autonomic neuropathy: Secondary | ICD-10-CM | POA: Diagnosis not present

## 2021-01-24 DIAGNOSIS — E039 Hypothyroidism, unspecified: Secondary | ICD-10-CM | POA: Diagnosis not present

## 2021-01-24 DIAGNOSIS — D649 Anemia, unspecified: Secondary | ICD-10-CM | POA: Diagnosis not present

## 2021-01-24 DIAGNOSIS — E785 Hyperlipidemia, unspecified: Secondary | ICD-10-CM | POA: Diagnosis not present

## 2021-01-24 DIAGNOSIS — I110 Hypertensive heart disease with heart failure: Secondary | ICD-10-CM | POA: Diagnosis not present

## 2021-01-26 DIAGNOSIS — D649 Anemia, unspecified: Secondary | ICD-10-CM | POA: Diagnosis not present

## 2021-01-26 DIAGNOSIS — I4891 Unspecified atrial fibrillation: Secondary | ICD-10-CM | POA: Diagnosis not present

## 2021-01-26 DIAGNOSIS — M1990 Unspecified osteoarthritis, unspecified site: Secondary | ICD-10-CM | POA: Diagnosis not present

## 2021-01-26 DIAGNOSIS — E039 Hypothyroidism, unspecified: Secondary | ICD-10-CM | POA: Diagnosis not present

## 2021-01-26 DIAGNOSIS — E785 Hyperlipidemia, unspecified: Secondary | ICD-10-CM | POA: Diagnosis not present

## 2021-01-26 DIAGNOSIS — I110 Hypertensive heart disease with heart failure: Secondary | ICD-10-CM | POA: Diagnosis not present

## 2021-01-26 DIAGNOSIS — G9009 Other idiopathic peripheral autonomic neuropathy: Secondary | ICD-10-CM | POA: Diagnosis not present

## 2021-01-26 DIAGNOSIS — I509 Heart failure, unspecified: Secondary | ICD-10-CM | POA: Diagnosis not present

## 2021-01-26 DIAGNOSIS — Z515 Encounter for palliative care: Secondary | ICD-10-CM | POA: Diagnosis not present

## 2021-01-30 DIAGNOSIS — E785 Hyperlipidemia, unspecified: Secondary | ICD-10-CM | POA: Diagnosis not present

## 2021-01-30 DIAGNOSIS — I509 Heart failure, unspecified: Secondary | ICD-10-CM | POA: Diagnosis not present

## 2021-01-30 DIAGNOSIS — I110 Hypertensive heart disease with heart failure: Secondary | ICD-10-CM | POA: Diagnosis not present

## 2021-01-30 DIAGNOSIS — E039 Hypothyroidism, unspecified: Secondary | ICD-10-CM | POA: Diagnosis not present

## 2021-01-30 DIAGNOSIS — D649 Anemia, unspecified: Secondary | ICD-10-CM | POA: Diagnosis not present

## 2021-01-30 DIAGNOSIS — G9009 Other idiopathic peripheral autonomic neuropathy: Secondary | ICD-10-CM | POA: Diagnosis not present

## 2021-02-05 DIAGNOSIS — E039 Hypothyroidism, unspecified: Secondary | ICD-10-CM | POA: Diagnosis not present

## 2021-02-05 DIAGNOSIS — E785 Hyperlipidemia, unspecified: Secondary | ICD-10-CM | POA: Diagnosis not present

## 2021-02-05 DIAGNOSIS — G9009 Other idiopathic peripheral autonomic neuropathy: Secondary | ICD-10-CM | POA: Diagnosis not present

## 2021-02-05 DIAGNOSIS — D649 Anemia, unspecified: Secondary | ICD-10-CM | POA: Diagnosis not present

## 2021-02-05 DIAGNOSIS — I509 Heart failure, unspecified: Secondary | ICD-10-CM | POA: Diagnosis not present

## 2021-02-05 DIAGNOSIS — I110 Hypertensive heart disease with heart failure: Secondary | ICD-10-CM | POA: Diagnosis not present

## 2021-02-06 DIAGNOSIS — E785 Hyperlipidemia, unspecified: Secondary | ICD-10-CM | POA: Diagnosis not present

## 2021-02-06 DIAGNOSIS — I110 Hypertensive heart disease with heart failure: Secondary | ICD-10-CM | POA: Diagnosis not present

## 2021-02-06 DIAGNOSIS — D649 Anemia, unspecified: Secondary | ICD-10-CM | POA: Diagnosis not present

## 2021-02-06 DIAGNOSIS — I509 Heart failure, unspecified: Secondary | ICD-10-CM | POA: Diagnosis not present

## 2021-02-06 DIAGNOSIS — G9009 Other idiopathic peripheral autonomic neuropathy: Secondary | ICD-10-CM | POA: Diagnosis not present

## 2021-02-06 DIAGNOSIS — E039 Hypothyroidism, unspecified: Secondary | ICD-10-CM | POA: Diagnosis not present

## 2021-02-08 DIAGNOSIS — I110 Hypertensive heart disease with heart failure: Secondary | ICD-10-CM | POA: Diagnosis not present

## 2021-02-08 DIAGNOSIS — I509 Heart failure, unspecified: Secondary | ICD-10-CM | POA: Diagnosis not present

## 2021-02-08 DIAGNOSIS — D649 Anemia, unspecified: Secondary | ICD-10-CM | POA: Diagnosis not present

## 2021-02-08 DIAGNOSIS — G9009 Other idiopathic peripheral autonomic neuropathy: Secondary | ICD-10-CM | POA: Diagnosis not present

## 2021-02-08 DIAGNOSIS — E039 Hypothyroidism, unspecified: Secondary | ICD-10-CM | POA: Diagnosis not present

## 2021-02-08 DIAGNOSIS — E785 Hyperlipidemia, unspecified: Secondary | ICD-10-CM | POA: Diagnosis not present

## 2021-02-09 ENCOUNTER — Other Ambulatory Visit: Payer: Self-pay | Admitting: Cardiology

## 2021-02-09 DIAGNOSIS — I110 Hypertensive heart disease with heart failure: Secondary | ICD-10-CM | POA: Diagnosis not present

## 2021-02-09 DIAGNOSIS — E039 Hypothyroidism, unspecified: Secondary | ICD-10-CM | POA: Diagnosis not present

## 2021-02-09 DIAGNOSIS — I509 Heart failure, unspecified: Secondary | ICD-10-CM | POA: Diagnosis not present

## 2021-02-09 DIAGNOSIS — G9009 Other idiopathic peripheral autonomic neuropathy: Secondary | ICD-10-CM | POA: Diagnosis not present

## 2021-02-09 DIAGNOSIS — E785 Hyperlipidemia, unspecified: Secondary | ICD-10-CM | POA: Diagnosis not present

## 2021-02-09 DIAGNOSIS — D649 Anemia, unspecified: Secondary | ICD-10-CM | POA: Diagnosis not present

## 2021-02-09 NOTE — Telephone Encounter (Signed)
Prescription refill request for Eliquis received. Indication:atrial fibrillation Last office visit: Needs OV Scr:0.84  5/21 Age: 87 Weight:112.5 kg  Prescription refilled

## 2021-02-13 ENCOUNTER — Telehealth (INDEPENDENT_AMBULATORY_CARE_PROVIDER_SITE_OTHER): Payer: Medicare Other | Admitting: Family Medicine

## 2021-02-13 ENCOUNTER — Other Ambulatory Visit: Payer: Self-pay

## 2021-02-13 DIAGNOSIS — E0590 Thyrotoxicosis, unspecified without thyrotoxic crisis or storm: Secondary | ICD-10-CM | POA: Diagnosis not present

## 2021-02-13 DIAGNOSIS — I10 Essential (primary) hypertension: Secondary | ICD-10-CM | POA: Diagnosis not present

## 2021-02-13 DIAGNOSIS — E559 Vitamin D deficiency, unspecified: Secondary | ICD-10-CM | POA: Diagnosis not present

## 2021-02-13 DIAGNOSIS — E039 Hypothyroidism, unspecified: Secondary | ICD-10-CM | POA: Diagnosis not present

## 2021-02-13 DIAGNOSIS — M1990 Unspecified osteoarthritis, unspecified site: Secondary | ICD-10-CM

## 2021-02-13 DIAGNOSIS — G9009 Other idiopathic peripheral autonomic neuropathy: Secondary | ICD-10-CM | POA: Diagnosis not present

## 2021-02-13 DIAGNOSIS — K5900 Constipation, unspecified: Secondary | ICD-10-CM | POA: Diagnosis not present

## 2021-02-13 DIAGNOSIS — I110 Hypertensive heart disease with heart failure: Secondary | ICD-10-CM | POA: Diagnosis not present

## 2021-02-13 DIAGNOSIS — E785 Hyperlipidemia, unspecified: Secondary | ICD-10-CM | POA: Diagnosis not present

## 2021-02-13 DIAGNOSIS — D649 Anemia, unspecified: Secondary | ICD-10-CM | POA: Diagnosis not present

## 2021-02-13 DIAGNOSIS — I509 Heart failure, unspecified: Secondary | ICD-10-CM | POA: Diagnosis not present

## 2021-02-13 NOTE — Assessment & Plan Note (Signed)
Well controlled, no changes to meds. Encouraged heart healthy diet such as the DASH diet and exercise as tolerated.  °

## 2021-02-13 NOTE — Progress Notes (Signed)
MyChart Video Visit    Virtual Visit via Video Note   This visit type was conducted due to national recommendations for restrictions regarding the COVID-19 Pandemic (e.g. social distancing) in an effort to limit this patient's exposure and mitigate transmission in our community. This patient is at least at moderate risk for complications without adequate follow up. This format is felt to be most appropriate for this patient at this time. Physical exam was limited by quality of the video and audio technology used for the visit. S Chism, CMA was able to get the patient set up on a video visit.  Patient location: home Patient, her daughter and provider in visit Provider location: Office  I discussed the limitations of evaluation and management by telemedicine and the availability of in person appointments. The patient expressed understanding and agreed to proceed.  Visit Date: 02/13/2021  Today's healthcare provider: Penni Homans, MD     Subjective:    Patient ID: Gina Reilly, female    DOB: 10-Feb-1924, 87 y.o.   MRN: [id number]  Chief Complaint  Patient presents with  . Follow-up  . Hyperglycemia  . vitamin D deficiency    HPI Patient is in today for follow up on chronic medical concerns. Her breathing is stable and doing well since she became essentially bedridden. No chest pain or palpitations. No recent febrile illness or hospitalizations. She did not sleep well last night due to a weather front moving in and making her right hip hurt. This happens when the weather changes. Denies CP/palp/SOB/HA/congestion/fevers/GI or GU c/o. Taking meds as prescribed  Past Medical History:  Diagnosis Date  . 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 Anemia   . Apnea, sleep 04/24/2012  . Arthritis    osteo  . Bronchitis 12/25/2011  . Chicken pox as a child  . Chronic atrial  fibrillation ([place])    a. 07/2010 TEE/DCCV:  EF 60-65% mild MR.; b. Recurrent Afib--> Amiodarone;  Pradaxa (CHA2DS2VASc = 4);  c. Amio d/c'd 2/2 toxicity - now chronic rate-controlled afib.  . Constipation 11/22/2014  . Contusion of leg, left 01/16/2012  . GERD (gastroesophageal reflux disease)   . History of echocardiogram    a. 07/2015 Echo: EF 60-65%, Ao sclerosis, mild MR, mildly dil LA/RV/RA, mild Tr, PASP 79mmHg.  Marland Kitchen Hyperglycemia 01/10/2010   Qualifier: Diagnosis of  By: Niel Hummer MD, [place] Hyperlipidemia   . Hypertension   . Hyperthyroidism 02/09/2009   Qualifier: Diagnosis of  By: Niel Hummer MD, [place] Hypoxia 04/14/2012  . Intermittent claudication ([place]) 05/22/2012  . Measles as a child  . Medicare annual wellness visit, subsequent 11/27/2014   Follows with pulmonology, Dr Gwenette Greet Follow with cardiology, Dr Rance Muir with Dr Matthew Saras Does not participate in [facility], pap or colonoscopy screening at this time.  . Mumps as a child  . Obese   . Ovarian cyst   . Pelvic mass in female   . Peripheral neuropathy 02/26/2012  . Precancerous skin lesion   . Skin cancer    left temple  . Thyroid disease    hypothroidism  . Vitamin D deficiency     Past Surgical History:  Procedure Laterality Date  . ABDOMINAL HYSTERECTOMY    . CATARACT EXTRACTION     Dr. Satira Sark  . SKIN BIOPSY     left temple.  cancerous  . TOTAL HIP ARTHROPLASTY  2003   left    Family History  Problem Relation Age of Onset  . Stroke Mother   . Prostate cancer Father        prostate  . Hypertension Brother   . Cancer Maternal Grandmother   . Hypertension Brother   . Heart disease Brother   . Obesity Brother   . Hypertension Brother   . Hyperlipidemia Brother   . Heart disease Brother   . Kidney disease Brother   . Cancer Daughter        kidney- spine and blood stream  . Heart disease Son        valvular heart disease  . Prostate cancer Brother     Social History   Socioeconomic  History  . Marital status: Widowed    Spouse name: Not on file  . Number of children: 14  . Years of education: Not on file  . Highest education level: Not on file  Occupational History  . Occupation: retired    Fish farm manager: RETIRED  Tobacco Use  . Smoking status: Former Smoker    Packs/day: 0.30    Years: 5.00    Pack years: 1.50    Types: Cigarettes    Quit date: 10/28/1973    Years since quitting: 47.3  . Smokeless tobacco: Never Used  Substance and Sexual Activity  . Alcohol use: Yes    Comment: socially  . Drug use: No  . Sexual activity: Not on file  Other Topics Concern  . Not on file  Social History Narrative  . Not on file   Social Determinants of Health   Financial Resource Strain: Not on file  Food Insecurity: Not on file  Transportation Needs: Not on file  Physical Activity: Not on file  Stress: Not on file  Social Connections: Not on file  Intimate Partner Violence: Not on file    Outpatient Medications Prior to Visit  Medication Sig Dispense Refill  . acetaminophen (TYLENOL) 650 MG CR tablet Take 650 mg by mouth every 6 (six) hours as needed for pain.    . bisacodyl (DULCOLAX) 10 MG suppository Place 1 suppository (10 mg total) rectally daily as needed for moderate constipation. 12 suppository 0  . Cholecalciferol (VITAMIN D) 50 MCG (2000 UT) CAPS Take 2,000 Units by mouth daily.    Marland Kitchen diltiazem (CARDIZEM CD) 360 MG 24 hr capsule TAKE 1 CAPSULE DAILY (NEED TO MAKE APPOINTMENT WITH PROVIDER FOR FURTHER REFILLS) 30 capsule 0  . ELIQUIS 5 MG TABS tablet TAKE 1 TABLET TWICE A DAY (NEED TO SCHEDULE OFFICE VISIT FOR FURTHER REFILLS) 60 tablet 0  . furosemide (LASIX) 20 MG tablet Take 1 tablet (20 mg total) by mouth daily as needed for fluid or edema. 30 tablet 5  . LORazepam (ATIVAN) 0.5 MG tablet Take 0.5 mg by mouth every 4 (four) hours as needed.    Marland Kitchen losartan (COZAAR) 25 MG tablet TAKE AS INSTRUCTED BY YOUR PRESCRIBER 30 tablet 0  . Morphine Sulfate (MORPHINE  CONCENTRATE) 10 mg / 0.5 ml concentrated solution SMARTSIG:5 Milligram(s) By Mouth Every 4 Hours PRN    . nystatin (MYCOSTATIN/NYSTOP) powder Apply 1 application topically 2 (two) times daily. 15 g 0  . ondansetron (ZOFRAN) 4 MG tablet Take 1 tablet (4 mg total) by mouth every 6 (six) hours as needed for nausea. 20 tablet 0  . polyethylene glycol (MIRALAX / GLYCOLAX) 17 g packet Take 17 g by mouth daily as needed for  mild constipation. 14 each 0  . rOPINIRole (REQUIP XL) 4 MG 24 hr tablet Take 1 tablet (4 mg total) by mouth at bedtime. 30 tablet 0  . senna (SENOKOT) 8.6 MG TABS tablet Take 2 tablets (17.2 mg total) by mouth at bedtime. 120 tablet 0  . traMADol (ULTRAM) 50 MG tablet Take 1 tablet (50 mg total) by mouth every 8 (eight) hours as needed for moderate pain.    . vitamin B-12 100 MCG tablet Take 1 tablet (100 mcg total) by mouth daily. 30 tablet 0   No facility-administered medications prior to visit.    Allergies  Allergen Reactions  . Amiodarone Other (See Comments)    Amiodarone pulmonary toxicity.  . Aspercreme [Trolamine Salicylate] Other (See Comments)    Pt states it caused redness like it was burning her skin.    Review of Systems  Constitutional: Positive for malaise/fatigue. Negative for fever.  HENT: Positive for hearing loss. Negative for congestion, ear discharge and tinnitus.   Eyes: Negative for blurred vision.  Respiratory: Negative for shortness of breath.   Cardiovascular: Negative for chest pain, palpitations and leg swelling.  Gastrointestinal: Negative for abdominal pain, blood in stool and nausea.  Genitourinary: Negative for dysuria and frequency.  Musculoskeletal: Positive for back pain and joint pain. Negative for falls.  Skin: Negative for rash.  Neurological: Negative for dizziness, loss of consciousness and headaches.  Endo/Heme/Allergies: Negative for environmental allergies.  Psychiatric/Behavioral: Negative for depression. The patient is not  nervous/anxious.        Objective:    Physical Exam Constitutional:      Appearance: Normal appearance. She is not ill-appearing.  HENT:     [place]: Normocephalic and atraumatic.     Right Ear: External ear normal.     Left Ear: External ear normal.     Nose: Nose normal.  Eyes:     General:        Right eye: No discharge.        Left eye: No discharge.  Pulmonary:     Effort: Pulmonary effort is normal.  Neurological:     Mental Status: She is alert and oriented to person, place, and time.  Psychiatric:        Behavior: Behavior normal.     BP 117/79   Pulse 70   Temp 98.3 F (36.8 C)   LMP 12/25/2011   SpO2 94% Comment: RA Wt Readings from Last 3 Encounters:  03/16/20 248 lb (112.5 kg)  12/02/19 230 lb (104.3 kg)  10/28/19 230 lb (104.3 kg)    Diabetic Foot Exam - Simple   No data filed    Lab Results  Component Value Date   WBC 8.1 03/15/2020   HGB 13.1 03/15/2020   HCT 41.6 03/15/2020   PLT 211 03/15/2020   GLUCOSE 129 (H) 03/15/2020   CHOL 151 12/02/2019   TRIG 128.0 12/02/2019   HDL 61.30 12/02/2019   LDLDIRECT 128.4 02/09/2009   LDLCALC 64 12/02/2019   ALT 18 03/12/2020   AST 20 03/12/2020   NA 136 03/15/2020   K 4.7 03/15/2020   CL 102 03/15/2020   CREATININE 0.72 03/15/2020   BUN 11 03/15/2020   CO2 25 03/15/2020   TSH 1.991 03/11/2020   INR 1.32 08/17/2010   HGBA1C 6.2 (H) 03/13/2020    Lab Results  Component Value Date   TSH 1.991 03/11/2020   Lab Results  Component Value Date   WBC 8.1 03/15/2020   HGB 13.1 03/15/2020  HCT 41.6 03/15/2020   MCV 107.8 (H) 03/15/2020   PLT 211 03/15/2020   Lab Results  Component Value Date   NA 136 03/15/2020   K 4.7 03/15/2020   CO2 25 03/15/2020   GLUCOSE 129 (H) 03/15/2020   BUN 11 03/15/2020   CREATININE 0.72 03/15/2020   BILITOT 1.1 03/12/2020   ALKPHOS 46 03/12/2020   AST 20 03/12/2020   ALT 18 03/12/2020   PROT 5.8 (L) 03/12/2020   ALBUMIN 3.4 (L) 03/15/2020   CALCIUM 9.3  03/15/2020   ANIONGAP 9 03/15/2020   GFR 63.14 12/02/2019   Lab Results  Component Value Date   CHOL 151 12/02/2019   Lab Results  Component Value Date   HDL 61.30 12/02/2019   Lab Results  Component Value Date   LDLCALC 64 12/02/2019   Lab Results  Component Value Date   TRIG 128.0 12/02/2019   Lab Results  Component Value Date   CHOLHDL 2 12/02/2019   Lab Results  Component Value Date   HGBA1C 6.2 (H) 03/13/2020       Assessment & Plan:   Problem List Items Addressed This Visit    Hyperthyroidism    They are choosing to hold off on labwork until next visit.       Vitamin D deficiency    Continue to supplement, discussed possibly doing blood work soon but she is doing well and essentially bed bound with good in home and family support. So will hold off on blood work for now.       Arthritis    She is managing her daily pain by alternating Tramadol and Tylenol Tramadol is 100 mg tid and Tylenol 650 is qid and except when the weather changes her pain is manageable. Her right hip really hurts when a new weather front moves in. She is encouraged to try applying topical creams when she knows a front is coming, such as CBD cream which she notes gives her some relief.       Hypertension    Well controlled, no changes to meds. Encouraged heart healthy diet such as the DASH diet and exercise as tolerated.       Constipation    Well controlled with Miralax daily, can consider adding Benefiber daily as well          I am having Gina Reilly maintain her Vitamin D, furosemide, rOPINIRole, bisacodyl, nystatin, ondansetron, polyethylene glycol, senna, cyanocobalamin, acetaminophen, LORazepam, morphine CONCENTRATE, traMADol, losartan, diltiazem, and Eliquis.  No orders of the defined types were placed in this encounter.   I discussed the assessment and treatment plan with the patient. The patient was provided an opportunity to ask questions and all were answered. The  patient agreed with the plan and demonstrated an understanding of the instructions.   The patient was advised to call back or seek an in-person evaluation if the symptoms worsen or if the condition fails to improve as anticipated.  I provided 26 minutes of face-to-face time during this encounter.   Penni Homans, MD [facility] [phone] (phone) [phone] (fax)  [place]

## 2021-02-13 NOTE — Assessment & Plan Note (Signed)
She is managing her daily pain by alternating Tramadol and Tylenol Tramadol is 100 mg tid and Tylenol 650 is qid and except when the weather changes her pain is manageable. Her right hip really hurts when a new weather front moves in. She is encouraged to try applying topical creams when she knows a front is coming, such as CBD cream which she notes gives her some relief.

## 2021-02-13 NOTE — Assessment & Plan Note (Signed)
They are choosing to hold off on labwork until next visit.

## 2021-02-13 NOTE — Assessment & Plan Note (Signed)
Continue to supplement, discussed possibly doing blood work soon but she is doing well and essentially bed bound with good in home and family support. So will hold off on blood work for now.

## 2021-02-13 NOTE — Assessment & Plan Note (Signed)
Well controlled with Miralax daily, can consider adding Benefiber daily as well

## 2021-02-20 DIAGNOSIS — I110 Hypertensive heart disease with heart failure: Secondary | ICD-10-CM | POA: Diagnosis not present

## 2021-02-20 DIAGNOSIS — E785 Hyperlipidemia, unspecified: Secondary | ICD-10-CM | POA: Diagnosis not present

## 2021-02-20 DIAGNOSIS — G9009 Other idiopathic peripheral autonomic neuropathy: Secondary | ICD-10-CM | POA: Diagnosis not present

## 2021-02-20 DIAGNOSIS — I509 Heart failure, unspecified: Secondary | ICD-10-CM | POA: Diagnosis not present

## 2021-02-20 DIAGNOSIS — E039 Hypothyroidism, unspecified: Secondary | ICD-10-CM | POA: Diagnosis not present

## 2021-02-20 DIAGNOSIS — D649 Anemia, unspecified: Secondary | ICD-10-CM | POA: Diagnosis not present

## 2021-02-23 DIAGNOSIS — I509 Heart failure, unspecified: Secondary | ICD-10-CM | POA: Diagnosis not present

## 2021-02-23 DIAGNOSIS — G9009 Other idiopathic peripheral autonomic neuropathy: Secondary | ICD-10-CM | POA: Diagnosis not present

## 2021-02-23 DIAGNOSIS — D649 Anemia, unspecified: Secondary | ICD-10-CM | POA: Diagnosis not present

## 2021-02-23 DIAGNOSIS — I110 Hypertensive heart disease with heart failure: Secondary | ICD-10-CM | POA: Diagnosis not present

## 2021-02-23 DIAGNOSIS — E039 Hypothyroidism, unspecified: Secondary | ICD-10-CM | POA: Diagnosis not present

## 2021-02-23 DIAGNOSIS — E785 Hyperlipidemia, unspecified: Secondary | ICD-10-CM | POA: Diagnosis not present

## 2021-02-25 DIAGNOSIS — D649 Anemia, unspecified: Secondary | ICD-10-CM | POA: Diagnosis not present

## 2021-02-25 DIAGNOSIS — M1990 Unspecified osteoarthritis, unspecified site: Secondary | ICD-10-CM | POA: Diagnosis not present

## 2021-02-25 DIAGNOSIS — Z515 Encounter for palliative care: Secondary | ICD-10-CM | POA: Diagnosis not present

## 2021-02-25 DIAGNOSIS — I110 Hypertensive heart disease with heart failure: Secondary | ICD-10-CM | POA: Diagnosis not present

## 2021-02-25 DIAGNOSIS — G9009 Other idiopathic peripheral autonomic neuropathy: Secondary | ICD-10-CM | POA: Diagnosis not present

## 2021-02-25 DIAGNOSIS — I509 Heart failure, unspecified: Secondary | ICD-10-CM | POA: Diagnosis not present

## 2021-02-25 DIAGNOSIS — E785 Hyperlipidemia, unspecified: Secondary | ICD-10-CM | POA: Diagnosis not present

## 2021-02-25 DIAGNOSIS — E039 Hypothyroidism, unspecified: Secondary | ICD-10-CM | POA: Diagnosis not present

## 2021-02-25 DIAGNOSIS — I4891 Unspecified atrial fibrillation: Secondary | ICD-10-CM | POA: Diagnosis not present

## 2021-03-01 DIAGNOSIS — I110 Hypertensive heart disease with heart failure: Secondary | ICD-10-CM | POA: Diagnosis not present

## 2021-03-01 DIAGNOSIS — I509 Heart failure, unspecified: Secondary | ICD-10-CM | POA: Diagnosis not present

## 2021-03-01 DIAGNOSIS — G9009 Other idiopathic peripheral autonomic neuropathy: Secondary | ICD-10-CM | POA: Diagnosis not present

## 2021-03-01 DIAGNOSIS — D649 Anemia, unspecified: Secondary | ICD-10-CM | POA: Diagnosis not present

## 2021-03-01 DIAGNOSIS — E039 Hypothyroidism, unspecified: Secondary | ICD-10-CM | POA: Diagnosis not present

## 2021-03-01 DIAGNOSIS — E785 Hyperlipidemia, unspecified: Secondary | ICD-10-CM | POA: Diagnosis not present

## 2021-03-06 DIAGNOSIS — G9009 Other idiopathic peripheral autonomic neuropathy: Secondary | ICD-10-CM | POA: Diagnosis not present

## 2021-03-06 DIAGNOSIS — E039 Hypothyroidism, unspecified: Secondary | ICD-10-CM | POA: Diagnosis not present

## 2021-03-06 DIAGNOSIS — E785 Hyperlipidemia, unspecified: Secondary | ICD-10-CM | POA: Diagnosis not present

## 2021-03-06 DIAGNOSIS — I509 Heart failure, unspecified: Secondary | ICD-10-CM | POA: Diagnosis not present

## 2021-03-06 DIAGNOSIS — D649 Anemia, unspecified: Secondary | ICD-10-CM | POA: Diagnosis not present

## 2021-03-06 DIAGNOSIS — I110 Hypertensive heart disease with heart failure: Secondary | ICD-10-CM | POA: Diagnosis not present

## 2021-03-08 ENCOUNTER — Telehealth: Payer: TRICARE For Life (TFL) | Admitting: Family Medicine

## 2021-03-12 DIAGNOSIS — D649 Anemia, unspecified: Secondary | ICD-10-CM | POA: Diagnosis not present

## 2021-03-12 DIAGNOSIS — E785 Hyperlipidemia, unspecified: Secondary | ICD-10-CM | POA: Diagnosis not present

## 2021-03-12 DIAGNOSIS — I509 Heart failure, unspecified: Secondary | ICD-10-CM | POA: Diagnosis not present

## 2021-03-12 DIAGNOSIS — I110 Hypertensive heart disease with heart failure: Secondary | ICD-10-CM | POA: Diagnosis not present

## 2021-03-12 DIAGNOSIS — G9009 Other idiopathic peripheral autonomic neuropathy: Secondary | ICD-10-CM | POA: Diagnosis not present

## 2021-03-12 DIAGNOSIS — E039 Hypothyroidism, unspecified: Secondary | ICD-10-CM | POA: Diagnosis not present

## 2021-03-14 DIAGNOSIS — I110 Hypertensive heart disease with heart failure: Secondary | ICD-10-CM | POA: Diagnosis not present

## 2021-03-14 DIAGNOSIS — I509 Heart failure, unspecified: Secondary | ICD-10-CM | POA: Diagnosis not present

## 2021-03-14 DIAGNOSIS — E039 Hypothyroidism, unspecified: Secondary | ICD-10-CM | POA: Diagnosis not present

## 2021-03-14 DIAGNOSIS — E785 Hyperlipidemia, unspecified: Secondary | ICD-10-CM | POA: Diagnosis not present

## 2021-03-14 DIAGNOSIS — D649 Anemia, unspecified: Secondary | ICD-10-CM | POA: Diagnosis not present

## 2021-03-14 DIAGNOSIS — G9009 Other idiopathic peripheral autonomic neuropathy: Secondary | ICD-10-CM | POA: Diagnosis not present

## 2021-03-15 ENCOUNTER — Telehealth: Payer: Self-pay | Admitting: Cardiology

## 2021-03-15 NOTE — Telephone Encounter (Signed)
[facility] phone [facility] [phone]

## 2021-03-15 NOTE — Telephone Encounter (Signed)
*  STAT* If patient is at the pharmacy, call can be transferred to refill team.   1. Which medications need to be refilled? (please list name of each medication and dose if known) Losartan   2. Which pharmacy/location (including street and city if local pharmacy) is [facility]   3. Do they need a 30 day or 90 day supply? Patient need 90

## 2021-03-19 ENCOUNTER — Telehealth: Payer: Self-pay | Admitting: Cardiology

## 2021-03-19 MED ORDER — LOSARTAN POTASSIUM 25 MG PO TABS: ORAL_TABLET | ORAL | 0 refills | Status: DC

## 2021-03-19 NOTE — Telephone Encounter (Signed)
Pt is calling back in about medication refill [facility] still has not received a script.

## 2021-03-19 NOTE — Telephone Encounter (Signed)
Patient calling the office for samples of medication:   1.  What medication and dosage are you requesting samples for? LOSARTAN   2.  Are you currently out of this medication? NO       PT IS NEEDING A SMALL SUPPLY OF RX LOSARTAN JUST IN CASE [facility] IS UNABLE TO DELIVER RX IN A TIMELY MANNER BECAUSE OF THE HOLIDAY, PT CURRENTLY HAS 4 PILLS

## 2021-03-19 NOTE — Addendum Note (Signed)
Addended by: Alvin Critchley on: 03/19/2021 02:51 PM   Modules accepted: Orders

## 2021-03-20 DIAGNOSIS — E785 Hyperlipidemia, unspecified: Secondary | ICD-10-CM | POA: Diagnosis not present

## 2021-03-20 DIAGNOSIS — G9009 Other idiopathic peripheral autonomic neuropathy: Secondary | ICD-10-CM | POA: Diagnosis not present

## 2021-03-20 DIAGNOSIS — I509 Heart failure, unspecified: Secondary | ICD-10-CM | POA: Diagnosis not present

## 2021-03-20 DIAGNOSIS — E039 Hypothyroidism, unspecified: Secondary | ICD-10-CM | POA: Diagnosis not present

## 2021-03-20 DIAGNOSIS — D649 Anemia, unspecified: Secondary | ICD-10-CM | POA: Diagnosis not present

## 2021-03-20 DIAGNOSIS — I110 Hypertensive heart disease with heart failure: Secondary | ICD-10-CM | POA: Diagnosis not present

## 2021-03-20 MED ORDER — LOSARTAN POTASSIUM 25 MG PO TABS: ORAL_TABLET | ORAL | 0 refills | Status: DC

## 2021-03-23 DIAGNOSIS — I509 Heart failure, unspecified: Secondary | ICD-10-CM | POA: Diagnosis not present

## 2021-03-23 DIAGNOSIS — G9009 Other idiopathic peripheral autonomic neuropathy: Secondary | ICD-10-CM | POA: Diagnosis not present

## 2021-03-23 DIAGNOSIS — E785 Hyperlipidemia, unspecified: Secondary | ICD-10-CM | POA: Diagnosis not present

## 2021-03-23 DIAGNOSIS — D649 Anemia, unspecified: Secondary | ICD-10-CM | POA: Diagnosis not present

## 2021-03-23 DIAGNOSIS — I110 Hypertensive heart disease with heart failure: Secondary | ICD-10-CM | POA: Diagnosis not present

## 2021-03-23 DIAGNOSIS — E039 Hypothyroidism, unspecified: Secondary | ICD-10-CM | POA: Diagnosis not present

## 2021-03-27 DIAGNOSIS — I509 Heart failure, unspecified: Secondary | ICD-10-CM | POA: Diagnosis not present

## 2021-03-27 DIAGNOSIS — E039 Hypothyroidism, unspecified: Secondary | ICD-10-CM | POA: Diagnosis not present

## 2021-03-27 DIAGNOSIS — G9009 Other idiopathic peripheral autonomic neuropathy: Secondary | ICD-10-CM | POA: Diagnosis not present

## 2021-03-27 DIAGNOSIS — E785 Hyperlipidemia, unspecified: Secondary | ICD-10-CM | POA: Diagnosis not present

## 2021-03-27 DIAGNOSIS — D649 Anemia, unspecified: Secondary | ICD-10-CM | POA: Diagnosis not present

## 2021-03-27 DIAGNOSIS — I110 Hypertensive heart disease with heart failure: Secondary | ICD-10-CM | POA: Diagnosis not present

## 2021-03-27 MED ORDER — DILTIAZEM HCL ER COATED BEADS 360 MG PO CP24: ORAL_CAPSULE | ORAL | 3 refills | Status: DC

## 2021-03-28 DIAGNOSIS — E785 Hyperlipidemia, unspecified: Secondary | ICD-10-CM | POA: Diagnosis not present

## 2021-03-28 DIAGNOSIS — Z515 Encounter for palliative care: Secondary | ICD-10-CM | POA: Diagnosis not present

## 2021-03-28 DIAGNOSIS — I110 Hypertensive heart disease with heart failure: Secondary | ICD-10-CM | POA: Diagnosis not present

## 2021-03-28 DIAGNOSIS — M1990 Unspecified osteoarthritis, unspecified site: Secondary | ICD-10-CM | POA: Diagnosis not present

## 2021-03-28 DIAGNOSIS — D649 Anemia, unspecified: Secondary | ICD-10-CM | POA: Diagnosis not present

## 2021-03-28 DIAGNOSIS — I509 Heart failure, unspecified: Secondary | ICD-10-CM | POA: Diagnosis not present

## 2021-03-28 DIAGNOSIS — E039 Hypothyroidism, unspecified: Secondary | ICD-10-CM | POA: Diagnosis not present

## 2021-03-28 DIAGNOSIS — G9009 Other idiopathic peripheral autonomic neuropathy: Secondary | ICD-10-CM | POA: Diagnosis not present

## 2021-03-28 DIAGNOSIS — I4891 Unspecified atrial fibrillation: Secondary | ICD-10-CM | POA: Diagnosis not present

## 2021-04-03 DIAGNOSIS — I110 Hypertensive heart disease with heart failure: Secondary | ICD-10-CM | POA: Diagnosis not present

## 2021-04-03 DIAGNOSIS — G9009 Other idiopathic peripheral autonomic neuropathy: Secondary | ICD-10-CM | POA: Diagnosis not present

## 2021-04-03 DIAGNOSIS — E039 Hypothyroidism, unspecified: Secondary | ICD-10-CM | POA: Diagnosis not present

## 2021-04-03 DIAGNOSIS — I509 Heart failure, unspecified: Secondary | ICD-10-CM | POA: Diagnosis not present

## 2021-04-03 DIAGNOSIS — D649 Anemia, unspecified: Secondary | ICD-10-CM | POA: Diagnosis not present

## 2021-04-03 DIAGNOSIS — E785 Hyperlipidemia, unspecified: Secondary | ICD-10-CM | POA: Diagnosis not present

## 2021-04-09 DIAGNOSIS — E039 Hypothyroidism, unspecified: Secondary | ICD-10-CM | POA: Diagnosis not present

## 2021-04-09 DIAGNOSIS — D649 Anemia, unspecified: Secondary | ICD-10-CM | POA: Diagnosis not present

## 2021-04-09 DIAGNOSIS — G9009 Other idiopathic peripheral autonomic neuropathy: Secondary | ICD-10-CM | POA: Diagnosis not present

## 2021-04-09 DIAGNOSIS — E785 Hyperlipidemia, unspecified: Secondary | ICD-10-CM | POA: Diagnosis not present

## 2021-04-09 DIAGNOSIS — I509 Heart failure, unspecified: Secondary | ICD-10-CM | POA: Diagnosis not present

## 2021-04-09 DIAGNOSIS — I110 Hypertensive heart disease with heart failure: Secondary | ICD-10-CM | POA: Diagnosis not present

## 2021-04-10 DIAGNOSIS — D649 Anemia, unspecified: Secondary | ICD-10-CM | POA: Diagnosis not present

## 2021-04-10 DIAGNOSIS — I110 Hypertensive heart disease with heart failure: Secondary | ICD-10-CM | POA: Diagnosis not present

## 2021-04-10 DIAGNOSIS — E785 Hyperlipidemia, unspecified: Secondary | ICD-10-CM | POA: Diagnosis not present

## 2021-04-10 DIAGNOSIS — E039 Hypothyroidism, unspecified: Secondary | ICD-10-CM | POA: Diagnosis not present

## 2021-04-10 DIAGNOSIS — G9009 Other idiopathic peripheral autonomic neuropathy: Secondary | ICD-10-CM | POA: Diagnosis not present

## 2021-04-10 DIAGNOSIS — I509 Heart failure, unspecified: Secondary | ICD-10-CM | POA: Diagnosis not present

## 2021-04-11 DIAGNOSIS — I110 Hypertensive heart disease with heart failure: Secondary | ICD-10-CM | POA: Diagnosis not present

## 2021-04-11 DIAGNOSIS — I509 Heart failure, unspecified: Secondary | ICD-10-CM | POA: Diagnosis not present

## 2021-04-11 DIAGNOSIS — E039 Hypothyroidism, unspecified: Secondary | ICD-10-CM | POA: Diagnosis not present

## 2021-04-11 DIAGNOSIS — G9009 Other idiopathic peripheral autonomic neuropathy: Secondary | ICD-10-CM | POA: Diagnosis not present

## 2021-04-11 DIAGNOSIS — E785 Hyperlipidemia, unspecified: Secondary | ICD-10-CM | POA: Diagnosis not present

## 2021-04-11 DIAGNOSIS — D649 Anemia, unspecified: Secondary | ICD-10-CM | POA: Diagnosis not present

## 2021-04-17 ENCOUNTER — Encounter: Payer: Self-pay | Admitting: Family Medicine

## 2021-04-17 DIAGNOSIS — I110 Hypertensive heart disease with heart failure: Secondary | ICD-10-CM | POA: Diagnosis not present

## 2021-04-17 DIAGNOSIS — I509 Heart failure, unspecified: Secondary | ICD-10-CM | POA: Diagnosis not present

## 2021-04-17 DIAGNOSIS — D649 Anemia, unspecified: Secondary | ICD-10-CM | POA: Diagnosis not present

## 2021-04-17 DIAGNOSIS — E785 Hyperlipidemia, unspecified: Secondary | ICD-10-CM | POA: Diagnosis not present

## 2021-04-17 DIAGNOSIS — E039 Hypothyroidism, unspecified: Secondary | ICD-10-CM | POA: Diagnosis not present

## 2021-04-17 DIAGNOSIS — G9009 Other idiopathic peripheral autonomic neuropathy: Secondary | ICD-10-CM | POA: Diagnosis not present

## 2021-04-19 NOTE — Telephone Encounter (Signed)
Patient scheduled for 05/17/21 at 320pm

## 2021-04-24 DIAGNOSIS — E039 Hypothyroidism, unspecified: Secondary | ICD-10-CM | POA: Diagnosis not present

## 2021-04-24 DIAGNOSIS — I110 Hypertensive heart disease with heart failure: Secondary | ICD-10-CM | POA: Diagnosis not present

## 2021-04-24 DIAGNOSIS — I509 Heart failure, unspecified: Secondary | ICD-10-CM | POA: Diagnosis not present

## 2021-04-24 DIAGNOSIS — G9009 Other idiopathic peripheral autonomic neuropathy: Secondary | ICD-10-CM | POA: Diagnosis not present

## 2021-04-24 DIAGNOSIS — D649 Anemia, unspecified: Secondary | ICD-10-CM | POA: Diagnosis not present

## 2021-04-24 DIAGNOSIS — E785 Hyperlipidemia, unspecified: Secondary | ICD-10-CM | POA: Diagnosis not present

## 2021-04-25 NOTE — Telephone Encounter (Signed)
Pt's daughter states that she will keep appointment

## 2021-04-27 DIAGNOSIS — M1990 Unspecified osteoarthritis, unspecified site: Secondary | ICD-10-CM | POA: Diagnosis not present

## 2021-04-27 DIAGNOSIS — I509 Heart failure, unspecified: Secondary | ICD-10-CM | POA: Diagnosis not present

## 2021-04-27 DIAGNOSIS — G9009 Other idiopathic peripheral autonomic neuropathy: Secondary | ICD-10-CM | POA: Diagnosis not present

## 2021-04-27 DIAGNOSIS — I110 Hypertensive heart disease with heart failure: Secondary | ICD-10-CM | POA: Diagnosis not present

## 2021-04-27 DIAGNOSIS — E039 Hypothyroidism, unspecified: Secondary | ICD-10-CM | POA: Diagnosis not present

## 2021-04-27 DIAGNOSIS — D649 Anemia, unspecified: Secondary | ICD-10-CM | POA: Diagnosis not present

## 2021-04-27 DIAGNOSIS — Z515 Encounter for palliative care: Secondary | ICD-10-CM | POA: Diagnosis not present

## 2021-04-27 DIAGNOSIS — I4891 Unspecified atrial fibrillation: Secondary | ICD-10-CM | POA: Diagnosis not present

## 2021-04-27 DIAGNOSIS — E785 Hyperlipidemia, unspecified: Secondary | ICD-10-CM | POA: Diagnosis not present

## 2021-05-01 DIAGNOSIS — E039 Hypothyroidism, unspecified: Secondary | ICD-10-CM | POA: Diagnosis not present

## 2021-05-01 DIAGNOSIS — G9009 Other idiopathic peripheral autonomic neuropathy: Secondary | ICD-10-CM | POA: Diagnosis not present

## 2021-05-01 DIAGNOSIS — D649 Anemia, unspecified: Secondary | ICD-10-CM | POA: Diagnosis not present

## 2021-05-01 DIAGNOSIS — I509 Heart failure, unspecified: Secondary | ICD-10-CM | POA: Diagnosis not present

## 2021-05-01 DIAGNOSIS — I110 Hypertensive heart disease with heart failure: Secondary | ICD-10-CM | POA: Diagnosis not present

## 2021-05-01 DIAGNOSIS — E785 Hyperlipidemia, unspecified: Secondary | ICD-10-CM | POA: Diagnosis not present

## 2021-05-03 DIAGNOSIS — E039 Hypothyroidism, unspecified: Secondary | ICD-10-CM | POA: Diagnosis not present

## 2021-05-03 DIAGNOSIS — E785 Hyperlipidemia, unspecified: Secondary | ICD-10-CM | POA: Diagnosis not present

## 2021-05-03 DIAGNOSIS — G9009 Other idiopathic peripheral autonomic neuropathy: Secondary | ICD-10-CM | POA: Diagnosis not present

## 2021-05-03 DIAGNOSIS — I110 Hypertensive heart disease with heart failure: Secondary | ICD-10-CM | POA: Diagnosis not present

## 2021-05-03 DIAGNOSIS — D649 Anemia, unspecified: Secondary | ICD-10-CM | POA: Diagnosis not present

## 2021-05-03 DIAGNOSIS — I509 Heart failure, unspecified: Secondary | ICD-10-CM | POA: Diagnosis not present

## 2021-05-08 DIAGNOSIS — E039 Hypothyroidism, unspecified: Secondary | ICD-10-CM | POA: Diagnosis not present

## 2021-05-08 DIAGNOSIS — I509 Heart failure, unspecified: Secondary | ICD-10-CM | POA: Diagnosis not present

## 2021-05-08 DIAGNOSIS — G9009 Other idiopathic peripheral autonomic neuropathy: Secondary | ICD-10-CM | POA: Diagnosis not present

## 2021-05-08 DIAGNOSIS — E785 Hyperlipidemia, unspecified: Secondary | ICD-10-CM | POA: Diagnosis not present

## 2021-05-08 DIAGNOSIS — I110 Hypertensive heart disease with heart failure: Secondary | ICD-10-CM | POA: Diagnosis not present

## 2021-05-08 DIAGNOSIS — D649 Anemia, unspecified: Secondary | ICD-10-CM | POA: Diagnosis not present

## 2021-05-09 DIAGNOSIS — E039 Hypothyroidism, unspecified: Secondary | ICD-10-CM | POA: Diagnosis not present

## 2021-05-09 DIAGNOSIS — I110 Hypertensive heart disease with heart failure: Secondary | ICD-10-CM | POA: Diagnosis not present

## 2021-05-09 DIAGNOSIS — E785 Hyperlipidemia, unspecified: Secondary | ICD-10-CM | POA: Diagnosis not present

## 2021-05-09 DIAGNOSIS — G9009 Other idiopathic peripheral autonomic neuropathy: Secondary | ICD-10-CM | POA: Diagnosis not present

## 2021-05-09 DIAGNOSIS — I509 Heart failure, unspecified: Secondary | ICD-10-CM | POA: Diagnosis not present

## 2021-05-09 DIAGNOSIS — D649 Anemia, unspecified: Secondary | ICD-10-CM | POA: Diagnosis not present

## 2021-05-16 DIAGNOSIS — G9009 Other idiopathic peripheral autonomic neuropathy: Secondary | ICD-10-CM | POA: Diagnosis not present

## 2021-05-16 DIAGNOSIS — I110 Hypertensive heart disease with heart failure: Secondary | ICD-10-CM | POA: Diagnosis not present

## 2021-05-16 DIAGNOSIS — E785 Hyperlipidemia, unspecified: Secondary | ICD-10-CM | POA: Diagnosis not present

## 2021-05-16 DIAGNOSIS — E039 Hypothyroidism, unspecified: Secondary | ICD-10-CM | POA: Diagnosis not present

## 2021-05-16 DIAGNOSIS — D649 Anemia, unspecified: Secondary | ICD-10-CM | POA: Diagnosis not present

## 2021-05-16 DIAGNOSIS — I509 Heart failure, unspecified: Secondary | ICD-10-CM | POA: Diagnosis not present

## 2021-05-17 ENCOUNTER — Encounter: Payer: Self-pay | Admitting: Family Medicine

## 2021-05-17 ENCOUNTER — Telehealth (INDEPENDENT_AMBULATORY_CARE_PROVIDER_SITE_OTHER): Payer: Medicare Other | Admitting: Family Medicine

## 2021-05-17 ENCOUNTER — Other Ambulatory Visit: Payer: Self-pay

## 2021-05-17 DIAGNOSIS — I5032 Chronic diastolic (congestive) heart failure: Secondary | ICD-10-CM

## 2021-05-17 DIAGNOSIS — I509 Heart failure, unspecified: Secondary | ICD-10-CM | POA: Diagnosis not present

## 2021-05-17 DIAGNOSIS — K5900 Constipation, unspecified: Secondary | ICD-10-CM | POA: Diagnosis not present

## 2021-05-17 DIAGNOSIS — I110 Hypertensive heart disease with heart failure: Secondary | ICD-10-CM | POA: Diagnosis not present

## 2021-05-17 DIAGNOSIS — E039 Hypothyroidism, unspecified: Secondary | ICD-10-CM | POA: Diagnosis not present

## 2021-05-17 DIAGNOSIS — M1990 Unspecified osteoarthritis, unspecified site: Secondary | ICD-10-CM | POA: Diagnosis not present

## 2021-05-17 DIAGNOSIS — D649 Anemia, unspecified: Secondary | ICD-10-CM | POA: Diagnosis not present

## 2021-05-17 DIAGNOSIS — E785 Hyperlipidemia, unspecified: Secondary | ICD-10-CM | POA: Diagnosis not present

## 2021-05-17 DIAGNOSIS — G9009 Other idiopathic peripheral autonomic neuropathy: Secondary | ICD-10-CM | POA: Diagnosis not present

## 2021-05-20 NOTE — Assessment & Plan Note (Signed)
Patient is now fully under Hospice care using their doctor but the family has chosen to continue intermittent visits with [place] as they feel it does her a great deal of good and the patient appreciates them.

## 2021-05-20 NOTE — Assessment & Plan Note (Signed)
Doing well on current regimen and no new concerns.

## 2021-05-20 NOTE — Progress Notes (Signed)
MyChart Video Visit    Virtual Visit via Video Note   This visit type was conducted due to national recommendations for restrictions regarding the COVID-19 Pandemic (e.g. social distancing) in an effort to limit this patient's exposure and mitigate transmission in our community. This patient is at least at moderate risk for complications without adequate follow up. This format is felt to be most appropriate for this patient at this time. Physical exam was limited by quality of the video and audio technology used for the visit. [name], CMA was able to get the patient set up on a video visit.  Patient location: home Patient and provider in visit Provider location: Office  I discussed the limitations of evaluation and management by telemedicine and the availability of in person appointments. The patient expressed understanding and agreed to proceed.  Visit Date: 05/17/2021  Today's healthcare provider: Penni Homans, MD     Subjective:    Patient ID: Gina Reilly, female    DOB: 15-Oct-1924, 87 y.o.   MRN: [id number]  Chief Complaint  Patient presents with   Follow-up    Hospice now picking up all medications including most OTCs    HPI Patient is in today for follow up on chronic medical concerns. She is doing fairly well. No recent febrile illness or hospitalizations. As she continues to decline over time they have switched her to full Hospice care but her family has decided to pay for intermittent virtual visits with [place] as they feel it does the patient good. Her constipation and pain are currently well managed. No new complaints. She is voiding and eating well.   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 07/2010 TEE/DCCV:  EF 60-65% mild MR.; b. Recurrent Afib--> Amiodarone;  Pradaxa (CHA2DS2VASc = 4);  c. Amio d/c'd 2/2 toxicity - now chronic rate-controlled afib.   Constipation 11/22/2014   Contusion of leg, left 01/16/2012   GERD (gastroesophageal reflux disease)    History of echocardiogram    a. 07/2015 Echo: EF 60-65%, Ao sclerosis, mild MR, mildly dil LA/RV/RA, mild Tr, PASP 37mHg.   Hyperglycemia 01/10/2010   Qualifier: Diagnosis of  By: SNiel HummerMD, WLorinda Creed   Hyperlipidemia    Hypertension    Hyperthyroidism 02/09/2009   Qualifier: Diagnosis of  By: SNiel HummerMD, WLorinda Creed   Hypoxia 04/14/2012   [place]ent [place]audication ([place] 05/22/2012   Measles as a child   Medicare annual wellness visit, subsequent 11/27/2014   Follows with pulmonology, Dr CGwenette GreetFollow with cardiology, Dr CRance Muirwith Dr HMatthew SarasDoes not participate in [facility] or [facility] at this time.   Mumps as a child   Obese    Ovarian cyst    Pelvic mass in female    Peripheral neuropathy 02/26/2012   Precancerous skin lesion    Skin cancer    left temple   Thyroid disease    hypothroidism   Vitamin D deficiency     Past Surgical History:  Procedure Laterality Date   ABDOMINAL HYSTERECTOMY     CATARACT EXTRACTION     Dr. TSatira Sark  SKIN BIOPSY     left temple. cancerous   TOTAL HIP ARTHROPLASTY  2003   left    Family History  Problem Relation Age of Onset   Stroke Mother    Prostate cancer Father        prostate   Hypertension Brother    Cancer Maternal Grandmother    Hypertension Brother    Heart disease Brother    Obesity Brother    Hypertension Brother    Hyperlipidemia Brother    Heart disease Brother    Kidney disease Brother    Cancer Daughter        kidney- spine and blood stream   Heart disease Son        valvular heart disease   Prostate cancer Brother     Social History   Socioeconomic History   Marital status: Widowed     Spouse name: Not on file   Number of children: 9   Years of education: Not on file   Highest education level: Not on file  Occupational History   Occupation: retired    Fish farm manager: RETIRED  Tobacco Use   Smoking status: Former    Packs/day: 0.30    Years: 5.00    Pack years: 1.50    Types: Cigarettes    Quit date: 10/28/1973    Years since quitting: 47.5   Smokeless tobacco: Never  Substance and Sexual Activity   Alcohol use: Yes    Comment: socially   Drug use: No   Sexual activity: Not on file  Other Topics Concern   Not on file  Social History Narrative   Not on file   Social Determinants of Health   Financial Resource Strain: Not on file  Food Insecurity: Not on file  Transportation Needs: Not on file  Physical Activity: Not on file  Stress: Not on file  Social Connections: Not on file  Intimate Partner Violence: Not on file    Outpatient Medications Prior to Visit  Medication Sig Dispense Refill   acetaminophen (TYLENOL) 650 MG CR tablet Take 650 mg by mouth every 6 (six) hours as needed for pain.     bisacodyl (DULCOLAX) 10 MG suppository Place 1 suppository (10 mg total) rectally daily as needed for moderate constipation. 12 suppository 0   Cholecalciferol (VITAMIN D) 50 MCG (2000 UT) CAPS Take 2,000 Units by mouth daily.     diltiazem (CARDIZEM CD) 360 MG 24 hr capsule TAKE 1 CAPSULE DAILY 90 capsule 3   ELIQUIS 5 MG TABS tablet TAKE 1 TABLET TWICE A DAY (NEED TO SCHEDULE OFFICE VISIT FOR FURTHER REFILLS) 60 tablet 0   furosemide (LASIX) 20 MG tablet Take 1 tablet (20 mg total) by mouth daily as needed for fluid or edema. 30 tablet 5   LORazepam (ATIVAN) 0.5 MG tablet Take 0.5 mg by mouth every 4 (four) hours as needed.     losartan (COZAAR) 25 MG tablet TAKE AS INSTRUCTED BY YOUR PRESCRIBER 30 tablet 0   Morphine Sulfate (MORPHINE CONCENTRATE) 10 mg / 0.5 ml concentrated solution SMARTSIG:5 Milligram(s) By Mouth Every 4 Hours PRN     nystatin (MYCOSTATIN/NYSTOP)  powder Apply 1 application topically 2 (two) times daily. 15 g 0   ondansetron (ZOFRAN) 4 MG tablet Take 1 tablet (4 mg total) by mouth every 6 (six) hours as needed for nausea. 20 tablet 0   polyethylene glycol (MIRALAX / GLYCOLAX) 17 g packet Take 17 g by mouth daily as needed for mild constipation. [place]  each 0   rOPINIRole (REQUIP XL) 4 MG 24 hr tablet Take 1 tablet (4 mg total) by mouth at bedtime. 30 tablet 0   senna (SENOKOT) 8.6 MG TABS tablet Take 2 tablets (17.2 mg total) by mouth at bedtime. 120 tablet 0   traMADol (ULTRAM) 50 MG tablet Take 1 tablet (50 mg total) by mouth every 8 (eight) hours as needed for moderate pain.     vitamin B-12 100 MCG tablet Take 1 tablet (100 mcg total) by mouth daily. 30 tablet 0   No facility-administered medications prior to visit.    Allergies  Allergen Reactions   Amiodarone Other (See Comments)    Amiodarone pulmonary toxicity.   Aspercreme [Trolamine Salicylate] Other (See Comments)    Pt states it caused redness like it was burning her skin.    Review of Systems  Constitutional:  Positive for malaise/fatigue. Negative for fever.  HENT:  Negative for congestion.   Eyes:  Negative for blurred vision.  Respiratory:  Positive for shortness of breath.   Cardiovascular:  Negative for chest pain, palpitations and leg swelling.  Gastrointestinal:  Negative for abdominal pain, blood in stool and nausea.  Genitourinary:  Negative for dysuria and frequency.  Musculoskeletal:  Negative for falls.  Skin:  Negative for rash.  Neurological:  Positive for weakness. Negative for dizziness, loss of consciousness and headaches.  Endo/Heme/Allergies:  Negative for environmental allergies.  Psychiatric/Behavioral:  Negative for depression. The patient is not nervous/anxious.       Objective:    Physical Exam Constitutional:      General: She is not in acute distress.    Appearance: Normal appearance. She is not ill-appearing or toxic-appearing.  HENT:      [place]: Normocephalic and atraumatic.     Right Ear: External ear normal.     Left Ear: External ear normal.     Nose: Nose normal.  Eyes:     General:        Right eye: No discharge.        Left eye: No discharge.  Pulmonary:     Effort: Pulmonary effort is normal.  Skin:    Findings: No rash.  Neurological:     Mental Status: She is alert and oriented to person, place, and time.  Psychiatric:        Behavior: Behavior normal.    BP (!) 113/58   Pulse 65   Temp (!) 97.1 F (36.2 C)   LMP 12/25/2011   SpO2 93%  Wt Readings from Last 3 Encounters:  03/16/20 248 lb (112.5 kg)  12/02/19 230 lb (104.3 kg)  10/28/19 230 lb (104.3 kg)    Diabetic Foot Exam - Simple   No data filed    Lab Results  Component Value Date   WBC 8.1 03/15/2020   HGB 13.1 03/15/2020   HCT 41.6 03/15/2020   PLT 211 03/15/2020   GLUCOSE 129 (H) 03/15/2020   CHOL 151 12/02/2019   TRIG 128.0 12/02/2019   HDL 61.30 12/02/2019   LDLDIRECT 128.4 02/09/2009   LDLCALC 64 12/02/2019   ALT 18 03/12/2020   AST 20 03/12/2020   NA 136 03/15/2020   K 4.7 03/15/2020   CL 102 03/15/2020   CREATININE 0.72 03/15/2020   BUN 11 03/15/2020   CO2 25 03/15/2020   TSH 1.991 03/11/2020   INR 1.32 08/17/2010   HGBA1C 6.2 (H) 03/13/2020    Lab Results  Component Value Date   TSH 1.991 03/11/2020  Lab Results  Component Value Date   WBC 8.1 03/15/2020   HGB 13.1 03/15/2020   HCT 41.6 03/15/2020   MCV 107.8 (H) 03/15/2020   PLT 211 03/15/2020   Lab Results  Component Value Date   NA 136 03/15/2020   K 4.7 03/15/2020   CO2 25 03/15/2020   GLUCOSE 129 (H) 03/15/2020   BUN 11 03/15/2020   CREATININE 0.72 03/15/2020   BILITOT 1.1 03/12/2020   ALKPHOS 46 03/12/2020   AST 20 03/12/2020   ALT 18 03/12/2020   PROT 5.8 (L) 03/12/2020   ALBUMIN 3.4 (L) 03/15/2020   CALCIUM 9.3 03/15/2020   ANIONGAP 9 03/15/2020   GFR 63.14 12/02/2019   Lab Results  Component Value Date   CHOL 151 12/02/2019    Lab Results  Component Value Date   HDL 61.30 12/02/2019   Lab Results  Component Value Date   LDLCALC 64 12/02/2019   Lab Results  Component Value Date   TRIG 128.0 12/02/2019   Lab Results  Component Value Date   CHOLHDL 2 12/02/2019   Lab Results  Component Value Date   HGBA1C 6.2 (H) 03/13/2020       Assessment & Plan:   Problem List Items Addressed This Visit     Arthritis    After several months of adjusting her meds they have found an adequate regimen that manages her pain.        Chronic diastolic heart failure ([place])    Patient is now fully under Hospice care using their doctor but the family has chosen to continue intermittent visits with [place] as they feel it does her a great deal of good and the patient appreciates them.        Constipation    Doing well on current regimen and no new concerns.         I am having Isidra N. Heikes maintain her Vitamin D, furosemide, rOPINIRole, bisacodyl, nystatin, ondansetron, polyethylene glycol, senna, cyanocobalamin, acetaminophen, LORazepam, morphine CONCENTRATE, traMADol, Eliquis, losartan, and diltiazem.  No orders of the defined types were placed in this encounter.   I discussed the assessment and treatment plan with the patient. The patient was provided an opportunity to ask questions and all were answered. The patient agreed with the plan and demonstrated an understanding of the instructions.   The patient was advised to call back or seek an in-person evaluation if the symptoms worsen or if the condition fails to improve as anticipated.  I provided 15 minutes of face-to-face time during this encounter.   Penni Homans, MD [facility] at [facility] [phone] (phone) [phone] (fax)  [place]

## 2021-05-20 NOTE — Assessment & Plan Note (Signed)
After several months of adjusting her meds they have found an adequate regimen that manages her pain.

## 2021-05-21 DIAGNOSIS — E785 Hyperlipidemia, unspecified: Secondary | ICD-10-CM | POA: Diagnosis not present

## 2021-05-21 DIAGNOSIS — G9009 Other idiopathic peripheral autonomic neuropathy: Secondary | ICD-10-CM | POA: Diagnosis not present

## 2021-05-21 DIAGNOSIS — I509 Heart failure, unspecified: Secondary | ICD-10-CM | POA: Diagnosis not present

## 2021-05-21 DIAGNOSIS — E039 Hypothyroidism, unspecified: Secondary | ICD-10-CM | POA: Diagnosis not present

## 2021-05-21 DIAGNOSIS — D649 Anemia, unspecified: Secondary | ICD-10-CM | POA: Diagnosis not present

## 2021-05-21 DIAGNOSIS — I110 Hypertensive heart disease with heart failure: Secondary | ICD-10-CM | POA: Diagnosis not present

## 2021-05-24 ENCOUNTER — Other Ambulatory Visit: Payer: Self-pay | Admitting: Family Medicine

## 2021-05-24 DIAGNOSIS — I509 Heart failure, unspecified: Secondary | ICD-10-CM | POA: Diagnosis not present

## 2021-05-24 DIAGNOSIS — E785 Hyperlipidemia, unspecified: Secondary | ICD-10-CM | POA: Diagnosis not present

## 2021-05-24 DIAGNOSIS — D649 Anemia, unspecified: Secondary | ICD-10-CM | POA: Diagnosis not present

## 2021-05-24 DIAGNOSIS — G9009 Other idiopathic peripheral autonomic neuropathy: Secondary | ICD-10-CM | POA: Diagnosis not present

## 2021-05-24 DIAGNOSIS — E039 Hypothyroidism, unspecified: Secondary | ICD-10-CM | POA: Diagnosis not present

## 2021-05-24 DIAGNOSIS — I110 Hypertensive heart disease with heart failure: Secondary | ICD-10-CM | POA: Diagnosis not present

## 2021-05-25 ENCOUNTER — Encounter: Payer: Self-pay | Admitting: Family Medicine

## 2021-05-25 ENCOUNTER — Other Ambulatory Visit: Payer: Self-pay | Admitting: Family Medicine

## 2021-05-25 MED ORDER — ROPINIROLE HCL ER 4 MG PO TB24: 4.0000 mg | ORAL_TABLET | Freq: Every day | ORAL | 1 refills | Status: DC

## 2021-05-28 DIAGNOSIS — E785 Hyperlipidemia, unspecified: Secondary | ICD-10-CM | POA: Diagnosis not present

## 2021-05-28 DIAGNOSIS — I4891 Unspecified atrial fibrillation: Secondary | ICD-10-CM | POA: Diagnosis not present

## 2021-05-28 DIAGNOSIS — I110 Hypertensive heart disease with heart failure: Secondary | ICD-10-CM | POA: Diagnosis not present

## 2021-05-28 DIAGNOSIS — I509 Heart failure, unspecified: Secondary | ICD-10-CM | POA: Diagnosis not present

## 2021-05-28 DIAGNOSIS — G9009 Other idiopathic peripheral autonomic neuropathy: Secondary | ICD-10-CM | POA: Diagnosis not present

## 2021-05-28 DIAGNOSIS — E039 Hypothyroidism, unspecified: Secondary | ICD-10-CM | POA: Diagnosis not present

## 2021-05-28 DIAGNOSIS — D649 Anemia, unspecified: Secondary | ICD-10-CM | POA: Diagnosis not present

## 2021-05-28 DIAGNOSIS — M1990 Unspecified osteoarthritis, unspecified site: Secondary | ICD-10-CM | POA: Diagnosis not present

## 2021-05-28 DIAGNOSIS — Z515 Encounter for palliative care: Secondary | ICD-10-CM | POA: Diagnosis not present

## 2021-06-07 ENCOUNTER — Telehealth: Payer: Self-pay

## 2021-06-07 DIAGNOSIS — M1990 Unspecified osteoarthritis, unspecified site: Secondary | ICD-10-CM

## 2021-06-07 DIAGNOSIS — I10 Essential (primary) hypertension: Secondary | ICD-10-CM

## 2021-06-07 DIAGNOSIS — M158 Other polyosteoarthritis: Secondary | ICD-10-CM

## 2021-06-07 DIAGNOSIS — I5032 Chronic diastolic (congestive) heart failure: Secondary | ICD-10-CM

## 2021-06-07 DIAGNOSIS — R2681 Unsteadiness on feet: Secondary | ICD-10-CM

## 2021-06-07 DIAGNOSIS — W19XXXA Unspecified fall, initial encounter: Secondary | ICD-10-CM

## 2021-06-07 DIAGNOSIS — R531 Weakness: Secondary | ICD-10-CM

## 2021-06-07 NOTE — Telephone Encounter (Signed)
Gina Reilly from [employer] call back number [phone] called.  She reports patient was discharged from hospice due to no decline in health in 6 months.  Now patient needs pcp to re order her medications for her. Patient is now on Tramadol 100 mg tid (this was increased by hospice) she only has enough medication until Sunday. [facility] [address]	[place].  She also need losartan and senna [facility].  Patient is bed bound, if ov needed it will need to be virtual.

## 2021-06-08 ENCOUNTER — Other Ambulatory Visit: Payer: Self-pay

## 2021-06-08 MED ORDER — SENNA 8.6 MG PO TABS: 2.0000 | ORAL_TABLET | Freq: Every day | ORAL | 0 refills | Status: DC

## 2021-06-08 MED ORDER — TRAMADOL HCL ER 100 MG PO TB24: 100.0000 mg | ORAL_TABLET | Freq: Three times a day (TID) | ORAL | 0 refills | Status: DC

## 2021-06-08 MED ORDER — LOSARTAN POTASSIUM 25 MG PO TABS: 25.0000 mg | ORAL_TABLET | Freq: Every day | ORAL | 0 refills | Status: DC

## 2021-06-08 NOTE — Telephone Encounter (Signed)
Spoke with Chaela from [facility] and advised that medications have been sent in.  She stated that Gina Reilly will need to start to fill medications again since she has been release from hospice care.

## 2021-06-12 ENCOUNTER — Other Ambulatory Visit: Payer: Self-pay | Admitting: *Deleted

## 2021-06-12 MED ORDER — TRAMADOL HCL 50 MG PO TABS: 100.0000 mg | ORAL_TABLET | Freq: Three times a day (TID) | ORAL | 0 refills | Status: DC | PRN

## 2021-06-19 DIAGNOSIS — E669 Obesity, unspecified: Secondary | ICD-10-CM | POA: Diagnosis not present

## 2021-06-19 DIAGNOSIS — Z6838 Body mass index (BMI) 38.0-38.9, adult: Secondary | ICD-10-CM | POA: Diagnosis not present

## 2021-06-19 DIAGNOSIS — Z993 Dependence on wheelchair: Secondary | ICD-10-CM | POA: Diagnosis not present

## 2021-06-19 DIAGNOSIS — D649 Anemia, unspecified: Secondary | ICD-10-CM | POA: Diagnosis not present

## 2021-06-19 DIAGNOSIS — E039 Hypothyroidism, unspecified: Secondary | ICD-10-CM | POA: Diagnosis not present

## 2021-06-19 DIAGNOSIS — I110 Hypertensive heart disease with heart failure: Secondary | ICD-10-CM | POA: Diagnosis not present

## 2021-06-19 DIAGNOSIS — N83209 Unspecified ovarian cyst, unspecified side: Secondary | ICD-10-CM | POA: Diagnosis not present

## 2021-06-19 DIAGNOSIS — R1900 Intra-abdominal and pelvic swelling, mass and lump, unspecified site: Secondary | ICD-10-CM | POA: Diagnosis not present

## 2021-06-19 DIAGNOSIS — E559 Vitamin D deficiency, unspecified: Secondary | ICD-10-CM | POA: Diagnosis not present

## 2021-06-19 DIAGNOSIS — M158 Other polyosteoarthritis: Secondary | ICD-10-CM | POA: Diagnosis not present

## 2021-06-19 DIAGNOSIS — K5900 Constipation, unspecified: Secondary | ICD-10-CM | POA: Diagnosis not present

## 2021-06-19 DIAGNOSIS — G4730 Sleep apnea, unspecified: Secondary | ICD-10-CM | POA: Diagnosis not present

## 2021-06-19 DIAGNOSIS — G629 Polyneuropathy, unspecified: Secondary | ICD-10-CM | POA: Diagnosis not present

## 2021-06-19 DIAGNOSIS — L989 Disorder of the skin and subcutaneous tissue, unspecified: Secondary | ICD-10-CM | POA: Diagnosis not present

## 2021-06-19 DIAGNOSIS — Z87891 Personal history of nicotine dependence: Secondary | ICD-10-CM | POA: Diagnosis not present

## 2021-06-19 DIAGNOSIS — I739 Peripheral vascular disease, unspecified: Secondary | ICD-10-CM | POA: Diagnosis not present

## 2021-06-19 DIAGNOSIS — K219 Gastro-esophageal reflux disease without esophagitis: Secondary | ICD-10-CM | POA: Diagnosis not present

## 2021-06-19 DIAGNOSIS — Z85828 Personal history of other malignant neoplasm of skin: Secondary | ICD-10-CM | POA: Diagnosis not present

## 2021-06-19 DIAGNOSIS — E785 Hyperlipidemia, unspecified: Secondary | ICD-10-CM | POA: Diagnosis not present

## 2021-06-19 DIAGNOSIS — Z7901 Long term (current) use of anticoagulants: Secondary | ICD-10-CM | POA: Diagnosis not present

## 2021-06-19 DIAGNOSIS — I482 Chronic atrial fibrillation, unspecified: Secondary | ICD-10-CM | POA: Diagnosis not present

## 2021-06-19 DIAGNOSIS — E0590 Thyrotoxicosis, unspecified without thyrotoxic crisis or storm: Secondary | ICD-10-CM | POA: Diagnosis not present

## 2021-06-19 DIAGNOSIS — I5032 Chronic diastolic (congestive) heart failure: Secondary | ICD-10-CM | POA: Diagnosis not present

## 2021-06-20 DIAGNOSIS — I482 Chronic atrial fibrillation, unspecified: Secondary | ICD-10-CM | POA: Diagnosis not present

## 2021-06-20 DIAGNOSIS — I5032 Chronic diastolic (congestive) heart failure: Secondary | ICD-10-CM | POA: Diagnosis not present

## 2021-06-20 DIAGNOSIS — I110 Hypertensive heart disease with heart failure: Secondary | ICD-10-CM | POA: Diagnosis not present

## 2021-06-20 DIAGNOSIS — M158 Other polyosteoarthritis: Secondary | ICD-10-CM | POA: Diagnosis not present

## 2021-06-20 DIAGNOSIS — D649 Anemia, unspecified: Secondary | ICD-10-CM | POA: Diagnosis not present

## 2021-06-20 DIAGNOSIS — G629 Polyneuropathy, unspecified: Secondary | ICD-10-CM | POA: Diagnosis not present

## 2021-06-22 ENCOUNTER — Encounter: Payer: Self-pay | Admitting: Family Medicine

## 2021-06-22 ENCOUNTER — Telehealth: Payer: TRICARE For Life (TFL) | Admitting: Cardiology

## 2021-06-25 DIAGNOSIS — I482 Chronic atrial fibrillation, unspecified: Secondary | ICD-10-CM | POA: Diagnosis not present

## 2021-06-25 DIAGNOSIS — D649 Anemia, unspecified: Secondary | ICD-10-CM | POA: Diagnosis not present

## 2021-06-25 DIAGNOSIS — I5032 Chronic diastolic (congestive) heart failure: Secondary | ICD-10-CM | POA: Diagnosis not present

## 2021-06-25 DIAGNOSIS — I110 Hypertensive heart disease with heart failure: Secondary | ICD-10-CM | POA: Diagnosis not present

## 2021-06-25 DIAGNOSIS — G629 Polyneuropathy, unspecified: Secondary | ICD-10-CM | POA: Diagnosis not present

## 2021-06-25 DIAGNOSIS — M158 Other polyosteoarthritis: Secondary | ICD-10-CM | POA: Diagnosis not present

## 2021-06-27 ENCOUNTER — Other Ambulatory Visit: Payer: Self-pay | Admitting: Family Medicine

## 2021-06-27 DIAGNOSIS — D649 Anemia, unspecified: Secondary | ICD-10-CM | POA: Diagnosis not present

## 2021-06-27 DIAGNOSIS — G629 Polyneuropathy, unspecified: Secondary | ICD-10-CM | POA: Diagnosis not present

## 2021-06-27 DIAGNOSIS — I5032 Chronic diastolic (congestive) heart failure: Secondary | ICD-10-CM | POA: Diagnosis not present

## 2021-06-27 DIAGNOSIS — I110 Hypertensive heart disease with heart failure: Secondary | ICD-10-CM | POA: Diagnosis not present

## 2021-06-27 DIAGNOSIS — M158 Other polyosteoarthritis: Secondary | ICD-10-CM | POA: Diagnosis not present

## 2021-06-27 DIAGNOSIS — I482 Chronic atrial fibrillation, unspecified: Secondary | ICD-10-CM | POA: Diagnosis not present

## 2021-06-27 MED ORDER — TRAMADOL HCL 50 MG PO TABS: 100.0000 mg | ORAL_TABLET | Freq: Three times a day (TID) | ORAL | 0 refills | Status: DC

## 2021-06-28 ENCOUNTER — Other Ambulatory Visit: Payer: Self-pay | Admitting: Family Medicine

## 2021-06-28 ENCOUNTER — Telehealth: Payer: Self-pay

## 2021-06-28 DIAGNOSIS — D649 Anemia, unspecified: Secondary | ICD-10-CM | POA: Diagnosis not present

## 2021-06-28 DIAGNOSIS — I110 Hypertensive heart disease with heart failure: Secondary | ICD-10-CM | POA: Diagnosis not present

## 2021-06-28 DIAGNOSIS — I482 Chronic atrial fibrillation, unspecified: Secondary | ICD-10-CM | POA: Diagnosis not present

## 2021-06-28 DIAGNOSIS — M158 Other polyosteoarthritis: Secondary | ICD-10-CM | POA: Diagnosis not present

## 2021-06-28 DIAGNOSIS — I5032 Chronic diastolic (congestive) heart failure: Secondary | ICD-10-CM | POA: Diagnosis not present

## 2021-06-28 DIAGNOSIS — G629 Polyneuropathy, unspecified: Secondary | ICD-10-CM | POA: Diagnosis not present

## 2021-06-28 MED ORDER — TRAMADOL HCL 50 MG PO TABS: 100.0000 mg | ORAL_TABLET | Freq: Three times a day (TID) | ORAL | 0 refills | Status: DC

## 2021-06-28 MED ORDER — APIXABAN 5 MG PO TABS: ORAL_TABLET | ORAL | 5 refills | Status: DC

## 2021-06-28 NOTE — Telephone Encounter (Signed)
Patient's care giver called inquiring about a prescription for Eli[facility] Per patient's family Dr. Charlett Blake was going to take over rx and send to pharmacy.

## 2021-07-03 DIAGNOSIS — G629 Polyneuropathy, unspecified: Secondary | ICD-10-CM | POA: Diagnosis not present

## 2021-07-03 DIAGNOSIS — M158 Other polyosteoarthritis: Secondary | ICD-10-CM | POA: Diagnosis not present

## 2021-07-03 DIAGNOSIS — I482 Chronic atrial fibrillation, unspecified: Secondary | ICD-10-CM | POA: Diagnosis not present

## 2021-07-03 DIAGNOSIS — I110 Hypertensive heart disease with heart failure: Secondary | ICD-10-CM | POA: Diagnosis not present

## 2021-07-03 DIAGNOSIS — I5032 Chronic diastolic (congestive) heart failure: Secondary | ICD-10-CM | POA: Diagnosis not present

## 2021-07-03 DIAGNOSIS — D649 Anemia, unspecified: Secondary | ICD-10-CM | POA: Diagnosis not present

## 2021-07-04 DIAGNOSIS — M158 Other polyosteoarthritis: Secondary | ICD-10-CM | POA: Diagnosis not present

## 2021-07-04 DIAGNOSIS — I5032 Chronic diastolic (congestive) heart failure: Secondary | ICD-10-CM | POA: Diagnosis not present

## 2021-07-04 DIAGNOSIS — I110 Hypertensive heart disease with heart failure: Secondary | ICD-10-CM | POA: Diagnosis not present

## 2021-07-04 DIAGNOSIS — D649 Anemia, unspecified: Secondary | ICD-10-CM | POA: Diagnosis not present

## 2021-07-04 DIAGNOSIS — I482 Chronic atrial fibrillation, unspecified: Secondary | ICD-10-CM | POA: Diagnosis not present

## 2021-07-04 DIAGNOSIS — G629 Polyneuropathy, unspecified: Secondary | ICD-10-CM | POA: Diagnosis not present

## 2021-07-05 ENCOUNTER — Telehealth: Payer: Self-pay | Admitting: Family Medicine

## 2021-07-05 DIAGNOSIS — I5032 Chronic diastolic (congestive) heart failure: Secondary | ICD-10-CM | POA: Diagnosis not present

## 2021-07-05 DIAGNOSIS — D649 Anemia, unspecified: Secondary | ICD-10-CM | POA: Diagnosis not present

## 2021-07-05 DIAGNOSIS — I482 Chronic atrial fibrillation, unspecified: Secondary | ICD-10-CM | POA: Diagnosis not present

## 2021-07-05 DIAGNOSIS — M158 Other polyosteoarthritis: Secondary | ICD-10-CM | POA: Diagnosis not present

## 2021-07-05 DIAGNOSIS — G629 Polyneuropathy, unspecified: Secondary | ICD-10-CM | POA: Diagnosis not present

## 2021-07-05 DIAGNOSIS — I110 Hypertensive heart disease with heart failure: Secondary | ICD-10-CM | POA: Diagnosis not present

## 2021-07-05 NOTE — Telephone Encounter (Signed)
Radovan the OT for [facility] called to get verbal orders to continue occupational therapy with the Patient. He would like 1 week 7, including this week. A voicemail can be left on his phone [phone].

## 2021-07-05 NOTE — Telephone Encounter (Signed)
Verbal given 

## 2021-07-06 ENCOUNTER — Other Ambulatory Visit: Payer: Self-pay | Admitting: Family Medicine

## 2021-07-09 ENCOUNTER — Telehealth: Payer: Self-pay | Admitting: *Deleted

## 2021-07-09 DIAGNOSIS — D649 Anemia, unspecified: Secondary | ICD-10-CM | POA: Diagnosis not present

## 2021-07-09 DIAGNOSIS — I5032 Chronic diastolic (congestive) heart failure: Secondary | ICD-10-CM | POA: Diagnosis not present

## 2021-07-09 DIAGNOSIS — I482 Chronic atrial fibrillation, unspecified: Secondary | ICD-10-CM | POA: Diagnosis not present

## 2021-07-09 DIAGNOSIS — M158 Other polyosteoarthritis: Secondary | ICD-10-CM | POA: Diagnosis not present

## 2021-07-09 DIAGNOSIS — G629 Polyneuropathy, unspecified: Secondary | ICD-10-CM | POA: Diagnosis not present

## 2021-07-09 DIAGNOSIS — I110 Hypertensive heart disease with heart failure: Secondary | ICD-10-CM | POA: Diagnosis not present

## 2021-07-09 NOTE — Telephone Encounter (Signed)
Need to see what ins card (we have 2 on her rx benefits).  Tried to call [facility], was on hold 36mn and had to hang up.  Will try again tomorrow.

## 2021-07-10 DIAGNOSIS — I5032 Chronic diastolic (congestive) heart failure: Secondary | ICD-10-CM | POA: Diagnosis not present

## 2021-07-10 DIAGNOSIS — G629 Polyneuropathy, unspecified: Secondary | ICD-10-CM | POA: Diagnosis not present

## 2021-07-10 DIAGNOSIS — I110 Hypertensive heart disease with heart failure: Secondary | ICD-10-CM | POA: Diagnosis not present

## 2021-07-10 DIAGNOSIS — I482 Chronic atrial fibrillation, unspecified: Secondary | ICD-10-CM | POA: Diagnosis not present

## 2021-07-10 DIAGNOSIS — M158 Other polyosteoarthritis: Secondary | ICD-10-CM | POA: Diagnosis not present

## 2021-07-10 DIAGNOSIS — D649 Anemia, unspecified: Secondary | ICD-10-CM | POA: Diagnosis not present

## 2021-07-10 NOTE — Telephone Encounter (Signed)
Spoke with Gina Reilly at [facility] and she stated that the prescription will need prior auth for the med and prior auth for the quantity.  I asked about next fill for rx being 07/12/21 and she stated that rx should go thru for the 180 tablets at that time.  New rx will be sent to pharmacy on 07/12/21 for 180 tablets.  Prior auth for medication: Case ID: [id number] 06/10/21-07/10/22 Prior auth for qty: Case ID: [id number] 06/10/21-07/10/22  Spoke with pharmacy and next fill per tech and pharmacist is 07/12/21.  They will put a note to fill for 180tabs for that day.  Patient daughter Gina Reilly notified about all above.

## 2021-07-10 NOTE — Telephone Encounter (Signed)
Spoke with daughter Kennyth Lose.  Advised her that since they picked up rx yesterday for Monday we will send in new rx and call pharmacy before they pickup rx to see what we can do about the 7 day limit.  As insurance should cover medication at a 180tabs.

## 2021-07-10 NOTE — Telephone Encounter (Signed)
Got ins info from pharmacy.  Prior auth started via cover my meds.  Awaiting determination.  Key: [id number]

## 2021-07-10 NOTE — Telephone Encounter (Signed)
Drug is covered by current benefit plan. No further PA activity needed

## 2021-07-10 NOTE — Telephone Encounter (Signed)
Called pharmacy and advised them that prior auth is not needed for 180 tabs.  Patient has already picked up medication yesterday.    Called daughter Kennyth Lose and advised that for her next refill that the pharmacy will need to call us immediately for rejection to authorize or help with rejection.  Daughter will go by and speak with store Freight forwarder as she works for [employer] in another department.  I advised her to speak with the pharmacy manager and advise about her situation and see if they can re-run rx for the 180 from yesterday and then call us.  If they do not call we will send in new rx on 07/16/21 for the 180 and see what happens or just have them refill the 138 that is left on the rx.

## 2021-07-16 ENCOUNTER — Other Ambulatory Visit: Payer: Self-pay | Admitting: *Deleted

## 2021-07-16 DIAGNOSIS — D649 Anemia, unspecified: Secondary | ICD-10-CM | POA: Diagnosis not present

## 2021-07-16 DIAGNOSIS — M158 Other polyosteoarthritis: Secondary | ICD-10-CM | POA: Diagnosis not present

## 2021-07-16 DIAGNOSIS — I5032 Chronic diastolic (congestive) heart failure: Secondary | ICD-10-CM | POA: Diagnosis not present

## 2021-07-16 DIAGNOSIS — I482 Chronic atrial fibrillation, unspecified: Secondary | ICD-10-CM | POA: Diagnosis not present

## 2021-07-16 DIAGNOSIS — I110 Hypertensive heart disease with heart failure: Secondary | ICD-10-CM | POA: Diagnosis not present

## 2021-07-16 DIAGNOSIS — G629 Polyneuropathy, unspecified: Secondary | ICD-10-CM | POA: Diagnosis not present

## 2021-07-16 MED ORDER — APIXABAN 5 MG PO TABS: ORAL_TABLET | ORAL | 1 refills | Status: DC

## 2021-07-19 DIAGNOSIS — M158 Other polyosteoarthritis: Secondary | ICD-10-CM | POA: Diagnosis not present

## 2021-07-19 DIAGNOSIS — I482 Chronic atrial fibrillation, unspecified: Secondary | ICD-10-CM | POA: Diagnosis not present

## 2021-07-19 DIAGNOSIS — Z87891 Personal history of nicotine dependence: Secondary | ICD-10-CM | POA: Diagnosis not present

## 2021-07-19 DIAGNOSIS — E039 Hypothyroidism, unspecified: Secondary | ICD-10-CM | POA: Diagnosis not present

## 2021-07-19 DIAGNOSIS — D649 Anemia, unspecified: Secondary | ICD-10-CM | POA: Diagnosis not present

## 2021-07-19 DIAGNOSIS — G4730 Sleep apnea, unspecified: Secondary | ICD-10-CM | POA: Diagnosis not present

## 2021-07-19 DIAGNOSIS — Z85828 Personal history of other malignant neoplasm of skin: Secondary | ICD-10-CM | POA: Diagnosis not present

## 2021-07-19 DIAGNOSIS — L989 Disorder of the skin and subcutaneous tissue, unspecified: Secondary | ICD-10-CM | POA: Diagnosis not present

## 2021-07-19 DIAGNOSIS — E785 Hyperlipidemia, unspecified: Secondary | ICD-10-CM | POA: Diagnosis not present

## 2021-07-19 DIAGNOSIS — K5900 Constipation, unspecified: Secondary | ICD-10-CM | POA: Diagnosis not present

## 2021-07-19 DIAGNOSIS — I5032 Chronic diastolic (congestive) heart failure: Secondary | ICD-10-CM | POA: Diagnosis not present

## 2021-07-19 DIAGNOSIS — Z7901 Long term (current) use of anticoagulants: Secondary | ICD-10-CM | POA: Diagnosis not present

## 2021-07-19 DIAGNOSIS — E0590 Thyrotoxicosis, unspecified without thyrotoxic crisis or storm: Secondary | ICD-10-CM | POA: Diagnosis not present

## 2021-07-19 DIAGNOSIS — I739 Peripheral vascular disease, unspecified: Secondary | ICD-10-CM | POA: Diagnosis not present

## 2021-07-19 DIAGNOSIS — E669 Obesity, unspecified: Secondary | ICD-10-CM | POA: Diagnosis not present

## 2021-07-19 DIAGNOSIS — K219 Gastro-esophageal reflux disease without esophagitis: Secondary | ICD-10-CM | POA: Diagnosis not present

## 2021-07-19 DIAGNOSIS — N83209 Unspecified ovarian cyst, unspecified side: Secondary | ICD-10-CM | POA: Diagnosis not present

## 2021-07-19 DIAGNOSIS — I110 Hypertensive heart disease with heart failure: Secondary | ICD-10-CM | POA: Diagnosis not present

## 2021-07-19 DIAGNOSIS — Z993 Dependence on wheelchair: Secondary | ICD-10-CM | POA: Diagnosis not present

## 2021-07-19 DIAGNOSIS — E559 Vitamin D deficiency, unspecified: Secondary | ICD-10-CM | POA: Diagnosis not present

## 2021-07-19 DIAGNOSIS — R1900 Intra-abdominal and pelvic swelling, mass and lump, unspecified site: Secondary | ICD-10-CM | POA: Diagnosis not present

## 2021-07-19 DIAGNOSIS — Z6838 Body mass index (BMI) 38.0-38.9, adult: Secondary | ICD-10-CM | POA: Diagnosis not present

## 2021-07-19 DIAGNOSIS — G629 Polyneuropathy, unspecified: Secondary | ICD-10-CM | POA: Diagnosis not present

## 2021-07-24 ENCOUNTER — Other Ambulatory Visit: Payer: Self-pay | Admitting: Family Medicine

## 2021-07-25 DIAGNOSIS — G629 Polyneuropathy, unspecified: Secondary | ICD-10-CM | POA: Diagnosis not present

## 2021-07-25 DIAGNOSIS — I5032 Chronic diastolic (congestive) heart failure: Secondary | ICD-10-CM | POA: Diagnosis not present

## 2021-07-25 DIAGNOSIS — I482 Chronic atrial fibrillation, unspecified: Secondary | ICD-10-CM | POA: Diagnosis not present

## 2021-07-25 DIAGNOSIS — M158 Other polyosteoarthritis: Secondary | ICD-10-CM | POA: Diagnosis not present

## 2021-07-25 DIAGNOSIS — I110 Hypertensive heart disease with heart failure: Secondary | ICD-10-CM | POA: Diagnosis not present

## 2021-07-25 DIAGNOSIS — D649 Anemia, unspecified: Secondary | ICD-10-CM | POA: Diagnosis not present

## 2021-07-26 DIAGNOSIS — D649 Anemia, unspecified: Secondary | ICD-10-CM | POA: Diagnosis not present

## 2021-07-26 DIAGNOSIS — I482 Chronic atrial fibrillation, unspecified: Secondary | ICD-10-CM | POA: Diagnosis not present

## 2021-07-26 DIAGNOSIS — M158 Other polyosteoarthritis: Secondary | ICD-10-CM | POA: Diagnosis not present

## 2021-07-26 DIAGNOSIS — G629 Polyneuropathy, unspecified: Secondary | ICD-10-CM | POA: Diagnosis not present

## 2021-07-26 DIAGNOSIS — I5032 Chronic diastolic (congestive) heart failure: Secondary | ICD-10-CM | POA: Diagnosis not present

## 2021-07-26 DIAGNOSIS — I110 Hypertensive heart disease with heart failure: Secondary | ICD-10-CM | POA: Diagnosis not present

## 2021-08-01 ENCOUNTER — Encounter: Payer: Self-pay | Admitting: Family Medicine

## 2021-08-01 DIAGNOSIS — I482 Chronic atrial fibrillation, unspecified: Secondary | ICD-10-CM | POA: Diagnosis not present

## 2021-08-01 DIAGNOSIS — I110 Hypertensive heart disease with heart failure: Secondary | ICD-10-CM | POA: Diagnosis not present

## 2021-08-01 DIAGNOSIS — M158 Other polyosteoarthritis: Secondary | ICD-10-CM | POA: Diagnosis not present

## 2021-08-01 DIAGNOSIS — I5032 Chronic diastolic (congestive) heart failure: Secondary | ICD-10-CM | POA: Diagnosis not present

## 2021-08-01 DIAGNOSIS — G629 Polyneuropathy, unspecified: Secondary | ICD-10-CM | POA: Diagnosis not present

## 2021-08-01 DIAGNOSIS — D649 Anemia, unspecified: Secondary | ICD-10-CM | POA: Diagnosis not present

## 2021-08-02 ENCOUNTER — Telehealth (INDEPENDENT_AMBULATORY_CARE_PROVIDER_SITE_OTHER): Payer: Medicare Other | Admitting: Family Medicine

## 2021-08-02 ENCOUNTER — Other Ambulatory Visit: Payer: Self-pay | Admitting: Family Medicine

## 2021-08-02 ENCOUNTER — Other Ambulatory Visit: Payer: Self-pay

## 2021-08-02 DIAGNOSIS — I5032 Chronic diastolic (congestive) heart failure: Secondary | ICD-10-CM | POA: Diagnosis not present

## 2021-08-02 DIAGNOSIS — E559 Vitamin D deficiency, unspecified: Secondary | ICD-10-CM

## 2021-08-02 DIAGNOSIS — R2681 Unsteadiness on feet: Secondary | ICD-10-CM | POA: Diagnosis not present

## 2021-08-02 DIAGNOSIS — I4811 Longstanding persistent atrial fibrillation: Secondary | ICD-10-CM | POA: Diagnosis not present

## 2021-08-02 DIAGNOSIS — R739 Hyperglycemia, unspecified: Secondary | ICD-10-CM | POA: Diagnosis not present

## 2021-08-02 MED ORDER — TRAMADOL HCL 50 MG PO TABS: 100.0000 mg | ORAL_TABLET | Freq: Three times a day (TID) | ORAL | 0 refills | Status: DC

## 2021-08-02 MED ORDER — ACETAMINOPHEN 325 MG PO TABS: 650.0000 mg | ORAL_TABLET | Freq: Four times a day (QID) | ORAL | 5 refills | Status: AC | PRN

## 2021-08-02 NOTE — Assessment & Plan Note (Signed)
supplement

## 2021-08-02 NOTE — Patient Instructions (Signed)
  Paxlovid and Molnupiravir are the new COVID medication we can give you if you get COVID so make sure you test if you have symptoms because we have to treat by day 5 of symptoms for it to be effective. If you are positive let us know so we can treat. If a home test is negative and your symptoms are persistent get a PCR test. Can check testing locations at [facility]com If you are positive we will make an appointment with [place] and we will send in Paxlovid or Molnupiravir if you would like it. Check with your pharmacy before we meet to confirm they have it in stock, if they do not then we can get the prescription at the [facility]

## 2021-08-02 NOTE — Assessment & Plan Note (Addendum)
Essentially bed bound and doing some physical therapy at home and she is getting a little stronger and they are hoping to be able to transfer to a chair and possibly more.

## 2021-08-02 NOTE — Progress Notes (Signed)
MyChart Video Visit    Virtual Visit via Video Note   This visit type was conducted due to national recommendations for restrictions regarding the COVID-19 Pandemic (e.g. social distancing) in an effort to limit this patient's exposure and mitigate transmission in our community. This patient is at least at moderate risk for complications without adequate follow up. This format is felt to be most appropriate for this patient at this time. Physical exam was limited by quality of the video and audio technology used for the visit. S. Chism, CMA was able to get the patient set up on a video visit.  Patient location: home Patient and provider in visit Provider location: Office  I discussed the limitations of evaluation and management by telemedicine and the availability of in person appointments. The patient expressed understanding and agreed to proceed.  Visit Date: 08/05/21  Today's healthcare provider: Penni Homans, MD     Subjective:    Patient ID: Gina Reilly, female    DOB: 12-16-1923, 87 y.o.   MRN: [id number]  No chief complaint on file.   HPI Patient is in today for a virtual visit for follow up on AFIB and HTN. She is doing well and has no febrile illnesses or recent ER visits to report. She is currently bed bound, but has regular PT sessions at her place. She alternates between tylenol and tramadol and uses topical rubs to manage her pain. She is eating well and feels good. Denies CP/palp/SOB/HA/congestion/fevers/GI or GU c/o. Taking meds as prescribed.   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 07/2010 TEE/DCCV:  EF 60-65% mild MR.; b. Recurrent Afib--> Amiodarone;  Pradaxa  (CHA2DS2VASc = 4);  c. Amio d/c'd 2/2 toxicity - now chronic rate-controlled afib.   Constipation 11/22/2014   Contusion of leg, left 01/16/2012   GERD (gastroesophageal reflux disease)    History of echocardiogram    a. 07/2015 Echo: EF 60-65%, Ao sclerosis, mild MR, mildly dil LA/RV/RA, mild Tr, PASP 27mmHg.   Hyperglycemia 01/10/2010   Qualifier: Diagnosis of  By: Niel Hummer MD, Lorinda Creed    Hyperlipidemia    Hypertension    Hyperthyroidism 02/09/2009   Qualifier: Diagnosis of  By: Niel Hummer MD, Lorinda Creed    Hypoxia 04/14/2012   Intermittent claudication ([place]) 05/22/2012   Measles as a child   Medicare annual wellness visit, subsequent 11/27/2014   Follows with pulmonology, Dr Gwenette Greet Follow with cardiology, Dr Rance Muir with Dr Matthew Saras Does not participate in [facility], pap or colonoscopy screening at this time.   Mumps as a child   Obese    Ovarian cyst    Pelvic mass in female    Peripheral neuropathy 02/26/2012   Precancerous skin lesion    Skin cancer    left temple   Thyroid disease    hypothroidism   Vitamin D deficiency     Past Surgical History:  Procedure Laterality Date   ABDOMINAL HYSTERECTOMY     CATARACT EXTRACTION     Dr. Satira Sark   SKIN BIOPSY     left temple. cancerous   TOTAL HIP ARTHROPLASTY  2003  left    Family History  Problem Relation Age of Onset   Stroke Mother    Prostate cancer Father        prostate   Hypertension Brother    Cancer Maternal Grandmother    Hypertension Brother    Heart disease Brother    Obesity Brother    Hypertension Brother    Hyperlipidemia Brother    Heart disease Brother    Kidney disease Brother    Cancer Daughter        kidney- spine and blood stream   Heart disease Son        valvular heart disease   Prostate cancer Brother     Social History   Socioeconomic History   Marital status: Widowed    Spouse name: Not on file   Number of children: 9   Years of education: Not on file   Highest education level:  Not on file  Occupational History   Occupation: retired    Fish farm manager: RETIRED  Tobacco Use   Smoking status: Former    Packs/day: 0.30    Years: 5.00    Pack years: 1.50    Types: Cigarettes    Quit date: 10/28/1973    Years since quitting: 47.8   Smokeless tobacco: Never  Substance and Sexual Activity   Alcohol use: Yes    Comment: socially   Drug use: No   Sexual activity: Not on file  Other Topics Concern   Not on file  Social History Narrative   Not on file   Social Determinants of Health   Financial Resource Strain: Not on file  Food Insecurity: Not on file  Transportation Needs: Not on file  Physical Activity: Not on file  Stress: Not on file  Social Connections: Not on file  Intimate Partner Violence: Not on file    Outpatient Medications Prior to Visit  Medication Sig Dispense Refill   apixaban (ELIQUIS) 5 MG TABS tablet TAKE 1 TABLET TWICE A DAY 180 tablet 1   bisacodyl (DULCOLAX) 10 MG suppository Place 1 suppository (10 mg total) rectally daily as needed for moderate constipation. 12 suppository 0   Cholecalciferol (VITAMIN D) 50 MCG (2000 UT) CAPS Take 2,000 Units by mouth daily.     diltiazem (CARDIZEM CD) 360 MG 24 hr capsule TAKE 1 CAPSULE DAILY 90 capsule 3   furosemide (LASIX) 20 MG tablet Take 1 tablet (20 mg total) by mouth daily as needed for fluid or edema. 30 tablet 5   LORazepam (ATIVAN) 0.5 MG tablet Take 0.5 mg by mouth every 4 (four) hours as needed.     Morphine Sulfate (MORPHINE CONCENTRATE) 10 mg / 0.5 ml concentrated solution SMARTSIG:5 Milligram(s) By Mouth Every 4 Hours PRN     nystatin (MYCOSTATIN/NYSTOP) powder Apply 1 application topically 2 (two) times daily. 15 g 0   ondansetron (ZOFRAN) 4 MG tablet Take 1 tablet (4 mg total) by mouth every 6 (six) hours as needed for nausea. 20 tablet 0   polyethylene glycol (MIRALAX / GLYCOLAX) 17 g packet Take 17 g by mouth daily as needed for mild constipation. 14 each 0   rOPINIRole (REQUIP XL) 4 MG  24 hr tablet Take 1 tablet (4 mg total) by mouth at bedtime. 90 tablet 1   senna (SENOKOT) 8.6 MG TABS tablet TAKE 2 TABLETS(17.2 MG) BY MOUTH AT BEDTIME 60 tablet 2   vitamin B-12 100 MCG tablet Take 1 tablet (100 mcg total) by mouth daily. 30 tablet 0  acetaminophen (TYLENOL) 650 MG CR tablet Take 650 mg by mouth every 6 (six) hours as needed for pain.     losartan (COZAAR) 25 MG tablet TAKE 1 TABLET(25 MG) BY MOUTH DAILY 30 tablet 0   traMADol (ULTRAM) 50 MG tablet Take 2 tablets (100 mg total) by mouth every 8 (eight) hours. 180 tablet 0   No facility-administered medications prior to visit.    Allergies  Allergen Reactions   Amiodarone Other (See Comments)    Amiodarone pulmonary toxicity.   Aspercreme [Trolamine Salicylate] Other (See Comments)    Pt states it caused redness like it was burning her skin.    Review of Systems  Constitutional:  Negative for chills, fever and malaise/fatigue.  HENT:  Negative for congestion, sinus pain and sore throat.   Eyes:  Negative for blurred vision.  Respiratory:  Negative for cough and shortness of breath.   Cardiovascular:  Negative for chest pain, palpitations and leg swelling.  Gastrointestinal:  Negative for blood in stool, diarrhea, nausea and vomiting.  Genitourinary:  Negative for flank pain and frequency.  Musculoskeletal:  Negative for back pain.  Skin:  Negative for rash.  Neurological:  Negative for headaches.      Objective:    Physical Exam Constitutional:      Appearance: Normal appearance.  HENT:     Mcniel: Normocephalic and atraumatic.     Right Ear: External ear normal.     Left Ear: External ear normal.  Pulmonary:     Effort: Pulmonary effort is normal.  Musculoskeletal:        General: Normal range of motion.     Cervical back: Normal range of motion.  Skin:    General: Skin is dry.  Neurological:     Mental Status: She is alert and oriented to person, place, and time.  Psychiatric:        Behavior:  Behavior normal.    BP 121/63   Pulse 64   Temp (!) 97.5 F (36.4 C)   LMP 12/25/2011   SpO2 92%  Wt Readings from Last 3 Encounters:  03/16/20 248 lb (112.5 kg)  12/02/19 230 lb (104.3 kg)  10/28/19 230 lb (104.3 kg)    Diabetic Foot Exam - Simple   No data filed    Lab Results  Component Value Date   WBC 8.1 03/15/2020   HGB 13.1 03/15/2020   HCT 41.6 03/15/2020   PLT 211 03/15/2020   GLUCOSE 129 (H) 03/15/2020   CHOL 151 12/02/2019   TRIG 128.0 12/02/2019   HDL 61.30 12/02/2019   LDLDIRECT 128.4 02/09/2009   LDLCALC 64 12/02/2019   ALT 18 03/12/2020   AST 20 03/12/2020   NA 136 03/15/2020   K 4.7 03/15/2020   CL 102 03/15/2020   CREATININE 0.72 03/15/2020   BUN 11 03/15/2020   CO2 25 03/15/2020   TSH 1.991 03/11/2020   INR 1.32 08/17/2010   HGBA1C 6.2 (H) 03/13/2020    Lab Results  Component Value Date   TSH 1.991 03/11/2020   Lab Results  Component Value Date   WBC 8.1 03/15/2020   HGB 13.1 03/15/2020   HCT 41.6 03/15/2020   MCV 107.8 (H) 03/15/2020   PLT 211 03/15/2020   Lab Results  Component Value Date   NA 136 03/15/2020   K 4.7 03/15/2020   CO2 25 03/15/2020   GLUCOSE 129 (H) 03/15/2020   BUN 11 03/15/2020   CREATININE 0.72 03/15/2020   BILITOT 1.1 03/12/2020  ALKPHOS 46 03/12/2020   AST 20 03/12/2020   ALT 18 03/12/2020   PROT 5.8 (L) 03/12/2020   ALBUMIN 3.4 (L) 03/15/2020   CALCIUM 9.3 03/15/2020   ANIONGAP 9 03/15/2020   GFR 63.14 12/02/2019   Lab Results  Component Value Date   CHOL 151 12/02/2019   Lab Results  Component Value Date   HDL 61.30 12/02/2019   Lab Results  Component Value Date   LDLCALC 64 12/02/2019   Lab Results  Component Value Date   TRIG 128.0 12/02/2019   Lab Results  Component Value Date   CHOLHDL 2 12/02/2019   Lab Results  Component Value Date   HGBA1C 6.2 (H) 03/13/2020       Assessment & Plan:   Problem List Items Addressed This Visit     Hyperglycemia    minimize simple  carbs. Increase exercise as tolerated.      Vitamin D deficiency    Continue low dose supplementation      ATRIAL FIBRILLATION    Family monitors rate controlled. Patient is essentially home and bed bound.       Chronic diastolic heart failure ([place])    She is essentially bed bound and being cared for by her family. She is doing well      Unsteady gait    Essentially bed bound and doing some physical therapy at home and she is getting a little stronger and they are hoping to be able to transfer to a chair and possibly more.       Hypomagnesemia    supplement         Meds ordered this encounter  Medications   traMADol (ULTRAM) 50 MG tablet    Sig: Take 2 tablets (100 mg total) by mouth every 8 (eight) hours.    Dispense:  180 tablet    Refill:  0   acetaminophen (TYLENOL) 325 MG tablet    Sig: Take 2 tablets (650 mg total) by mouth every 6 (six) hours as needed.    Dispense:  720 tablet    Refill:  5    I discussed the assessment and treatment plan with the patient. The patient was provided an opportunity to ask questions and all were answered. The patient agreed with the plan and demonstrated an understanding of the instructions.   The patient was advised to call back or seek an in-person evaluation if the symptoms worsen or if the condition fails to improve as anticipated.  I provided 20 minutes of face-to-face time during this encounter.   Penni Homans, MD [facility] [phone] (phone) [phone] (fax)  Baldwin, Suezanne Jacquet, acting as a scribe for Penni Homans, MD, have documented all relevent documentation on behalf of Penni Homans, MD, as directed by Penni Homans, MD while in the presence of Penni Homans, MD. DO:08/05/21.  I, Mosie Lukes, MD personally performed the services described in this documentation. All medical record entries made by the scribe were at my direction and in my  presence. I have reviewed the chart and agree that the record reflects my personal performance and is accurate and complete

## 2021-08-05 NOTE — Assessment & Plan Note (Signed)
minimize simple carbs. Increase exercise as tolerated.  

## 2021-08-05 NOTE — Assessment & Plan Note (Signed)
Continue low dose supplementation

## 2021-08-05 NOTE — Assessment & Plan Note (Signed)
Family monitors rate controlled. Patient is essentially home and bed bound.

## 2021-08-05 NOTE — Assessment & Plan Note (Signed)
She is essentially bed bound and being cared for by her family. She is doing well

## 2021-08-06 DIAGNOSIS — M158 Other polyosteoarthritis: Secondary | ICD-10-CM | POA: Diagnosis not present

## 2021-08-06 DIAGNOSIS — D649 Anemia, unspecified: Secondary | ICD-10-CM | POA: Diagnosis not present

## 2021-08-06 DIAGNOSIS — I482 Chronic atrial fibrillation, unspecified: Secondary | ICD-10-CM | POA: Diagnosis not present

## 2021-08-06 DIAGNOSIS — I5032 Chronic diastolic (congestive) heart failure: Secondary | ICD-10-CM | POA: Diagnosis not present

## 2021-08-06 DIAGNOSIS — I110 Hypertensive heart disease with heart failure: Secondary | ICD-10-CM | POA: Diagnosis not present

## 2021-08-06 DIAGNOSIS — G629 Polyneuropathy, unspecified: Secondary | ICD-10-CM | POA: Diagnosis not present

## 2021-08-06 DIAGNOSIS — Z23 Encounter for immunization: Secondary | ICD-10-CM | POA: Diagnosis not present

## 2021-08-08 DIAGNOSIS — I5032 Chronic diastolic (congestive) heart failure: Secondary | ICD-10-CM | POA: Diagnosis not present

## 2021-08-08 DIAGNOSIS — D649 Anemia, unspecified: Secondary | ICD-10-CM | POA: Diagnosis not present

## 2021-08-08 DIAGNOSIS — I110 Hypertensive heart disease with heart failure: Secondary | ICD-10-CM | POA: Diagnosis not present

## 2021-08-08 DIAGNOSIS — G629 Polyneuropathy, unspecified: Secondary | ICD-10-CM | POA: Diagnosis not present

## 2021-08-08 DIAGNOSIS — I482 Chronic atrial fibrillation, unspecified: Secondary | ICD-10-CM | POA: Diagnosis not present

## 2021-08-08 DIAGNOSIS — M158 Other polyosteoarthritis: Secondary | ICD-10-CM | POA: Diagnosis not present

## 2021-08-15 ENCOUNTER — Other Ambulatory Visit: Payer: Self-pay | Admitting: Family Medicine

## 2021-08-16 ENCOUNTER — Telehealth: Payer: Self-pay | Admitting: Family Medicine

## 2021-08-16 DIAGNOSIS — G629 Polyneuropathy, unspecified: Secondary | ICD-10-CM | POA: Diagnosis not present

## 2021-08-16 DIAGNOSIS — M158 Other polyosteoarthritis: Secondary | ICD-10-CM | POA: Diagnosis not present

## 2021-08-16 DIAGNOSIS — I110 Hypertensive heart disease with heart failure: Secondary | ICD-10-CM | POA: Diagnosis not present

## 2021-08-16 DIAGNOSIS — I5032 Chronic diastolic (congestive) heart failure: Secondary | ICD-10-CM | POA: Diagnosis not present

## 2021-08-16 DIAGNOSIS — I482 Chronic atrial fibrillation, unspecified: Secondary | ICD-10-CM | POA: Diagnosis not present

## 2021-08-16 DIAGNOSIS — D649 Anemia, unspecified: Secondary | ICD-10-CM | POA: Diagnosis not present

## 2021-08-16 NOTE — Telephone Encounter (Signed)
Tracy: Radovan: [phone]  Pt will be discharged from OT 10/20 due to being at [facility]

## 2021-08-18 DIAGNOSIS — G4730 Sleep apnea, unspecified: Secondary | ICD-10-CM | POA: Diagnosis not present

## 2021-08-18 DIAGNOSIS — E559 Vitamin D deficiency, unspecified: Secondary | ICD-10-CM | POA: Diagnosis not present

## 2021-08-18 DIAGNOSIS — I110 Hypertensive heart disease with heart failure: Secondary | ICD-10-CM | POA: Diagnosis not present

## 2021-08-18 DIAGNOSIS — L989 Disorder of the skin and subcutaneous tissue, unspecified: Secondary | ICD-10-CM | POA: Diagnosis not present

## 2021-08-18 DIAGNOSIS — E7800 Pure hypercholesterolemia, unspecified: Secondary | ICD-10-CM | POA: Diagnosis not present

## 2021-08-18 DIAGNOSIS — Z7901 Long term (current) use of anticoagulants: Secondary | ICD-10-CM | POA: Diagnosis not present

## 2021-08-18 DIAGNOSIS — E039 Hypothyroidism, unspecified: Secondary | ICD-10-CM | POA: Diagnosis not present

## 2021-08-18 DIAGNOSIS — G629 Polyneuropathy, unspecified: Secondary | ICD-10-CM | POA: Diagnosis not present

## 2021-08-18 DIAGNOSIS — Z6838 Body mass index (BMI) 38.0-38.9, adult: Secondary | ICD-10-CM | POA: Diagnosis not present

## 2021-08-18 DIAGNOSIS — E669 Obesity, unspecified: Secondary | ICD-10-CM | POA: Diagnosis not present

## 2021-08-18 DIAGNOSIS — I739 Peripheral vascular disease, unspecified: Secondary | ICD-10-CM | POA: Diagnosis not present

## 2021-08-18 DIAGNOSIS — I5032 Chronic diastolic (congestive) heart failure: Secondary | ICD-10-CM | POA: Diagnosis not present

## 2021-08-18 DIAGNOSIS — I482 Chronic atrial fibrillation, unspecified: Secondary | ICD-10-CM | POA: Diagnosis not present

## 2021-08-18 DIAGNOSIS — Z85828 Personal history of other malignant neoplasm of skin: Secondary | ICD-10-CM | POA: Diagnosis not present

## 2021-08-18 DIAGNOSIS — D649 Anemia, unspecified: Secondary | ICD-10-CM | POA: Diagnosis not present

## 2021-08-18 DIAGNOSIS — K5900 Constipation, unspecified: Secondary | ICD-10-CM | POA: Diagnosis not present

## 2021-08-18 DIAGNOSIS — Z79891 Long term (current) use of opiate analgesic: Secondary | ICD-10-CM | POA: Diagnosis not present

## 2021-08-18 DIAGNOSIS — Z993 Dependence on wheelchair: Secondary | ICD-10-CM | POA: Diagnosis not present

## 2021-08-18 DIAGNOSIS — K219 Gastro-esophageal reflux disease without esophagitis: Secondary | ICD-10-CM | POA: Diagnosis not present

## 2021-08-18 DIAGNOSIS — M158 Other polyosteoarthritis: Secondary | ICD-10-CM | POA: Diagnosis not present

## 2021-08-18 DIAGNOSIS — E0590 Thyrotoxicosis, unspecified without thyrotoxic crisis or storm: Secondary | ICD-10-CM | POA: Diagnosis not present

## 2021-08-18 DIAGNOSIS — Z87891 Personal history of nicotine dependence: Secondary | ICD-10-CM | POA: Diagnosis not present

## 2021-08-31 DIAGNOSIS — G629 Polyneuropathy, unspecified: Secondary | ICD-10-CM | POA: Diagnosis not present

## 2021-08-31 DIAGNOSIS — I5032 Chronic diastolic (congestive) heart failure: Secondary | ICD-10-CM | POA: Diagnosis not present

## 2021-08-31 DIAGNOSIS — I110 Hypertensive heart disease with heart failure: Secondary | ICD-10-CM | POA: Diagnosis not present

## 2021-08-31 DIAGNOSIS — D649 Anemia, unspecified: Secondary | ICD-10-CM | POA: Diagnosis not present

## 2021-08-31 DIAGNOSIS — I482 Chronic atrial fibrillation, unspecified: Secondary | ICD-10-CM | POA: Diagnosis not present

## 2021-08-31 DIAGNOSIS — M158 Other polyosteoarthritis: Secondary | ICD-10-CM | POA: Diagnosis not present

## 2021-09-01 ENCOUNTER — Encounter: Payer: Self-pay | Admitting: Family Medicine

## 2021-09-01 ENCOUNTER — Other Ambulatory Visit: Payer: Self-pay | Admitting: Family Medicine

## 2021-09-03 NOTE — Telephone Encounter (Signed)
Requesting: tramadol Contract: none UDS: none Last Visit: 08/02/21 Next Visit: 09/18/21 Last Refill:08/02/21  Please Advise

## 2021-09-04 DIAGNOSIS — G629 Polyneuropathy, unspecified: Secondary | ICD-10-CM | POA: Diagnosis not present

## 2021-09-04 DIAGNOSIS — M158 Other polyosteoarthritis: Secondary | ICD-10-CM | POA: Diagnosis not present

## 2021-09-04 DIAGNOSIS — I5032 Chronic diastolic (congestive) heart failure: Secondary | ICD-10-CM | POA: Diagnosis not present

## 2021-09-04 DIAGNOSIS — D649 Anemia, unspecified: Secondary | ICD-10-CM | POA: Diagnosis not present

## 2021-09-04 DIAGNOSIS — I110 Hypertensive heart disease with heart failure: Secondary | ICD-10-CM | POA: Diagnosis not present

## 2021-09-04 DIAGNOSIS — I482 Chronic atrial fibrillation, unspecified: Secondary | ICD-10-CM | POA: Diagnosis not present

## 2021-09-06 DIAGNOSIS — D649 Anemia, unspecified: Secondary | ICD-10-CM | POA: Diagnosis not present

## 2021-09-06 DIAGNOSIS — I5032 Chronic diastolic (congestive) heart failure: Secondary | ICD-10-CM | POA: Diagnosis not present

## 2021-09-06 DIAGNOSIS — I482 Chronic atrial fibrillation, unspecified: Secondary | ICD-10-CM | POA: Diagnosis not present

## 2021-09-06 DIAGNOSIS — I110 Hypertensive heart disease with heart failure: Secondary | ICD-10-CM | POA: Diagnosis not present

## 2021-09-06 DIAGNOSIS — M158 Other polyosteoarthritis: Secondary | ICD-10-CM | POA: Diagnosis not present

## 2021-09-06 DIAGNOSIS — G629 Polyneuropathy, unspecified: Secondary | ICD-10-CM | POA: Diagnosis not present

## 2021-09-12 DIAGNOSIS — I5032 Chronic diastolic (congestive) heart failure: Secondary | ICD-10-CM | POA: Diagnosis not present

## 2021-09-12 DIAGNOSIS — D649 Anemia, unspecified: Secondary | ICD-10-CM | POA: Diagnosis not present

## 2021-09-12 DIAGNOSIS — G629 Polyneuropathy, unspecified: Secondary | ICD-10-CM | POA: Diagnosis not present

## 2021-09-12 DIAGNOSIS — I110 Hypertensive heart disease with heart failure: Secondary | ICD-10-CM | POA: Diagnosis not present

## 2021-09-12 DIAGNOSIS — I482 Chronic atrial fibrillation, unspecified: Secondary | ICD-10-CM | POA: Diagnosis not present

## 2021-09-12 DIAGNOSIS — M158 Other polyosteoarthritis: Secondary | ICD-10-CM | POA: Diagnosis not present

## 2021-09-13 ENCOUNTER — Encounter: Payer: Self-pay | Admitting: Family Medicine

## 2021-09-17 DIAGNOSIS — I110 Hypertensive heart disease with heart failure: Secondary | ICD-10-CM | POA: Diagnosis not present

## 2021-09-17 DIAGNOSIS — Z993 Dependence on wheelchair: Secondary | ICD-10-CM | POA: Diagnosis not present

## 2021-09-17 DIAGNOSIS — I5032 Chronic diastolic (congestive) heart failure: Secondary | ICD-10-CM | POA: Diagnosis not present

## 2021-09-17 DIAGNOSIS — K5900 Constipation, unspecified: Secondary | ICD-10-CM | POA: Diagnosis not present

## 2021-09-17 DIAGNOSIS — Z87891 Personal history of nicotine dependence: Secondary | ICD-10-CM | POA: Diagnosis not present

## 2021-09-17 DIAGNOSIS — K219 Gastro-esophageal reflux disease without esophagitis: Secondary | ICD-10-CM | POA: Diagnosis not present

## 2021-09-17 DIAGNOSIS — E559 Vitamin D deficiency, unspecified: Secondary | ICD-10-CM | POA: Diagnosis not present

## 2021-09-17 DIAGNOSIS — E039 Hypothyroidism, unspecified: Secondary | ICD-10-CM | POA: Diagnosis not present

## 2021-09-17 DIAGNOSIS — Z85828 Personal history of other malignant neoplasm of skin: Secondary | ICD-10-CM | POA: Diagnosis not present

## 2021-09-17 DIAGNOSIS — I482 Chronic atrial fibrillation, unspecified: Secondary | ICD-10-CM | POA: Diagnosis not present

## 2021-09-17 DIAGNOSIS — I739 Peripheral vascular disease, unspecified: Secondary | ICD-10-CM | POA: Diagnosis not present

## 2021-09-17 DIAGNOSIS — E0590 Thyrotoxicosis, unspecified without thyrotoxic crisis or storm: Secondary | ICD-10-CM | POA: Diagnosis not present

## 2021-09-17 DIAGNOSIS — E7800 Pure hypercholesterolemia, unspecified: Secondary | ICD-10-CM | POA: Diagnosis not present

## 2021-09-17 DIAGNOSIS — D649 Anemia, unspecified: Secondary | ICD-10-CM | POA: Diagnosis not present

## 2021-09-17 DIAGNOSIS — G4730 Sleep apnea, unspecified: Secondary | ICD-10-CM | POA: Diagnosis not present

## 2021-09-17 DIAGNOSIS — L989 Disorder of the skin and subcutaneous tissue, unspecified: Secondary | ICD-10-CM | POA: Diagnosis not present

## 2021-09-17 DIAGNOSIS — M158 Other polyosteoarthritis: Secondary | ICD-10-CM | POA: Diagnosis not present

## 2021-09-17 DIAGNOSIS — Z79891 Long term (current) use of opiate analgesic: Secondary | ICD-10-CM | POA: Diagnosis not present

## 2021-09-17 DIAGNOSIS — Z7901 Long term (current) use of anticoagulants: Secondary | ICD-10-CM | POA: Diagnosis not present

## 2021-09-17 DIAGNOSIS — E669 Obesity, unspecified: Secondary | ICD-10-CM | POA: Diagnosis not present

## 2021-09-17 DIAGNOSIS — G629 Polyneuropathy, unspecified: Secondary | ICD-10-CM | POA: Diagnosis not present

## 2021-09-17 DIAGNOSIS — Z6838 Body mass index (BMI) 38.0-38.9, adult: Secondary | ICD-10-CM | POA: Diagnosis not present

## 2021-09-17 NOTE — Telephone Encounter (Signed)
Pt would like to keep appointment in December

## 2021-09-18 ENCOUNTER — Telehealth: Payer: Medicare Other | Admitting: Family Medicine

## 2021-09-21 DIAGNOSIS — I482 Chronic atrial fibrillation, unspecified: Secondary | ICD-10-CM | POA: Diagnosis not present

## 2021-09-21 DIAGNOSIS — D649 Anemia, unspecified: Secondary | ICD-10-CM | POA: Diagnosis not present

## 2021-09-21 DIAGNOSIS — G629 Polyneuropathy, unspecified: Secondary | ICD-10-CM | POA: Diagnosis not present

## 2021-09-21 DIAGNOSIS — I5032 Chronic diastolic (congestive) heart failure: Secondary | ICD-10-CM | POA: Diagnosis not present

## 2021-09-21 DIAGNOSIS — I110 Hypertensive heart disease with heart failure: Secondary | ICD-10-CM | POA: Diagnosis not present

## 2021-09-21 DIAGNOSIS — M158 Other polyosteoarthritis: Secondary | ICD-10-CM | POA: Diagnosis not present

## 2021-09-26 ENCOUNTER — Other Ambulatory Visit: Payer: Self-pay | Admitting: Family Medicine

## 2021-09-26 DIAGNOSIS — I110 Hypertensive heart disease with heart failure: Secondary | ICD-10-CM | POA: Diagnosis not present

## 2021-09-26 DIAGNOSIS — I482 Chronic atrial fibrillation, unspecified: Secondary | ICD-10-CM | POA: Diagnosis not present

## 2021-09-26 DIAGNOSIS — D649 Anemia, unspecified: Secondary | ICD-10-CM | POA: Diagnosis not present

## 2021-09-26 DIAGNOSIS — I5032 Chronic diastolic (congestive) heart failure: Secondary | ICD-10-CM | POA: Diagnosis not present

## 2021-09-26 DIAGNOSIS — M158 Other polyosteoarthritis: Secondary | ICD-10-CM | POA: Diagnosis not present

## 2021-09-26 DIAGNOSIS — G629 Polyneuropathy, unspecified: Secondary | ICD-10-CM | POA: Diagnosis not present

## 2021-09-27 NOTE — Telephone Encounter (Signed)
Can fill in [facility] absence?  Requesting:tramadol Contract: none UDS: none Last Visit: 08/02/21 Next Visit: 10/09/21 Last Refill: 09/03/21  Please Advise

## 2021-10-02 ENCOUNTER — Encounter: Payer: Self-pay | Admitting: Family Medicine

## 2021-10-02 DIAGNOSIS — I110 Hypertensive heart disease with heart failure: Secondary | ICD-10-CM | POA: Diagnosis not present

## 2021-10-02 DIAGNOSIS — I482 Chronic atrial fibrillation, unspecified: Secondary | ICD-10-CM | POA: Diagnosis not present

## 2021-10-02 DIAGNOSIS — M158 Other polyosteoarthritis: Secondary | ICD-10-CM | POA: Diagnosis not present

## 2021-10-02 DIAGNOSIS — G629 Polyneuropathy, unspecified: Secondary | ICD-10-CM | POA: Diagnosis not present

## 2021-10-02 DIAGNOSIS — D649 Anemia, unspecified: Secondary | ICD-10-CM | POA: Diagnosis not present

## 2021-10-02 DIAGNOSIS — I5032 Chronic diastolic (congestive) heart failure: Secondary | ICD-10-CM | POA: Diagnosis not present

## 2021-10-03 ENCOUNTER — Encounter: Payer: Self-pay | Admitting: Family Medicine

## 2021-10-09 ENCOUNTER — Telehealth: Payer: Medicare Other | Admitting: Family Medicine

## 2021-10-15 ENCOUNTER — Other Ambulatory Visit: Payer: Self-pay | Admitting: Family Medicine

## 2021-10-24 ENCOUNTER — Other Ambulatory Visit: Payer: Self-pay | Admitting: Family Medicine

## 2021-10-29 ENCOUNTER — Other Ambulatory Visit: Payer: Self-pay | Admitting: Family Medicine

## 2021-10-31 ENCOUNTER — Other Ambulatory Visit: Payer: Self-pay | Admitting: Family Medicine

## 2021-11-28 ENCOUNTER — Other Ambulatory Visit: Payer: Self-pay | Admitting: Family Medicine

## 2021-11-28 NOTE — Telephone Encounter (Signed)
Requesting: tramadol 50mg   Contract: None UDS: 11/25/2014 Last Visit: 08/02/2021 Next Visit: 01/17/2022 Last Refill: 11/01/2021 #180 and 0RF Pt sig: 2 tab q8h prn  Please Advise

## 2021-12-25 ENCOUNTER — Other Ambulatory Visit: Payer: Self-pay | Admitting: Family Medicine

## 2021-12-26 ENCOUNTER — Other Ambulatory Visit: Payer: Self-pay | Admitting: Family Medicine

## 2021-12-26 ENCOUNTER — Encounter: Payer: Self-pay | Admitting: Family Medicine

## 2021-12-27 NOTE — Telephone Encounter (Signed)
Requesting: tramadol ?Contract: none ?UDS: none ?Last Visit: 08/02/21 ?Next Visit: 01/17/22 ?Last Refill: 11/28/21 ? ?Please Advise ? ?

## 2022-01-09 NOTE — Progress Notes (Addendum)
? ?Subjective:  ? Gina Reilly is a 88 y.o. female who presents for Medicare Annual (Subsequent) preventive examination. ? ?I connected with  Gina Reilly on 01/11/22 by a audio enabled telemedicine application and verified that I am speaking with the correct person using two identifiers. ? ?Patient Location: Home ? ?Provider Location: Office/Clinic ? ?I discussed the limitations of evaluation and management by telemedicine. The patient expressed understanding and agreed to proceed.  ? ? ?Review of Systems    ? ?Cardiac Risk Factors include: advanced age (>80mn, >>67women);hypertension;dyslipidemia ? ?   ?Objective:  ?  ?There were no vitals filed for this visit. ?There is no height or weight on file to calculate BMI. ? ?Advanced Directives 01/11/2022 03/11/2020 09/09/2017 09/05/2016 09/25/2014  ?Does Patient Have a Medical Advance Directive? Yes Yes Yes Yes No  ?Type of [place] of [place] DNR (pink MOST or yellow form);Living will [place]Living will [place]Living will [place] will -  ?Does patient want to make changes to medical advance directive? - No - Patient declined - No - Patient declined -  ?Copy of [place] Chart? Yes - validated most recent copy scanned in chart (See row information) No - copy requested Yes Yes -  ? ? ?Current Medications (verified) ?Outpatient Encounter Medications as of 01/11/2022  ?Medication Sig  ? acetaminophen (TYLENOL) 325 MG tablet Take 2 tablets (650 mg total) by mouth every 6 (six) hours as needed.  ? bisacodyl (DULCOLAX) 10 MG suppository Place 1 suppository (10 mg total) rectally daily as needed for moderate constipation.  ? Cholecalciferol (VITAMIN D) 50 MCG (2000 UT) CAPS Take 2,000 Units by mouth daily.  ? diltiazem (CARDIZEM CD) 360 MG 24 hr capsule TAKE 1 CAPSULE DAILY  ? ELIQUIS 5 MG TABS tablet TAKE 1 TABLET TWICE A DAY  ? furosemide  (LASIX) 20 MG tablet Take 1 tablet (20 mg total) by mouth daily as needed for fluid or edema.  ? LORazepam (ATIVAN) 0.5 MG tablet Take 0.5 mg by mouth every 4 (four) hours as needed.  ? losartan (COZAAR) 25 MG tablet TAKE 1 TABLET DAILY  ? nystatin (MYCOSTATIN/NYSTOP) powder Apply 1 application topically 2 (two) times daily.  ? ondansetron (ZOFRAN) 4 MG tablet Take 1 tablet (4 mg total) by mouth every 6 (six) hours as needed for nausea.  ? polyethylene glycol (MIRALAX / GLYCOLAX) 17 g packet Take 17 g by mouth daily as needed for mild constipation.  ? rOPINIRole (REQUIP XL) 4 MG 24 hr tablet TAKE 1 TABLET AT BEDTIME  ? senna (SENOKOT) 8.6 MG TABS tablet TAKE 2 TABLETS(17.2 MG) BY MOUTH AT BEDTIME  ? traMADol (ULTRAM) 50 MG tablet TAKE 2 TABLETS(100 MG) BY MOUTH EVERY 8 HOURS  ? vitamin B-12 100 MCG tablet Take 1 tablet (100 mcg total) by mouth daily.  ? Morphine Sulfate (MORPHINE CONCENTRATE) 10 mg / 0.5 ml concentrated solution SMARTSIG:5 Milligram(s) By Mouth Every 4 Hours PRN  ? ?No facility-administered encounter medications on file as of 01/11/2022.  ? ? ?Allergies (verified) ?Amiodarone and Aspercreme [trolamine salicylate]  ? ?History: ?Past Medical History:  ?Diagnosis Date  ? 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 Evaluated in pulmonary clinic/ [facility]   - d/c amiodarone 05/18/12 > desat with exercise resolved 06/16/2012    ? Anemia   ? Apnea, sleep 04/24/2012  ? Arthritis   ? osteo  ? Bronchitis 12/25/2011  ? Chicken pox  as a child  ? Chronic atrial fibrillation ([facility])   ? a. 07/2010 TEE/DCCV:  EF 60-65% mild MR.; b. Recurrent Afib--> Amiodarone;  Pradaxa (CHA2DS2VASc = 4);  c. Amio d/c'd 2/2 toxicity - now chronic rate-controlled afib.  ? Constipation 11/22/2014  ? Contusion of leg, left 01/16/2012  ? GERD (gastroesophageal reflux disease)   ? History of echocardiogram   ? a. 07/2015 Echo: EF 60-65%, Ao sclerosis, mild MR, mildly dil LA/RV/RA, mild Tr, PASP 45mHg.  ? Hyperglycemia 01/10/2010  ? Qualifier:  Diagnosis of  By: SNiel HummerMD, WLorinda Creed  ? Hyperlipidemia   ? Hypertension   ? Hyperthyroidism 02/09/2009  ? Qualifier: Diagnosis of  By: SNiel HummerMD, WLorinda Creed  ? Hypoxia 04/14/2012  ? In[place]audication ([place] 05/22/2012  ? Measles as a child  ? Medicare annual wellness visit, subsequent 11/27/2014  ? Follows with pulmonology, Dr CGwenette GreetFollow with cardiology, Dr CRance Muirwith Dr HMatthew SarasDoes not [facility] at this time.  ? Mumps as a child  ? Obese   ? Ovarian cyst   ? Pelvic mass in female   ? Peripheral neuropathy 02/26/2012  ? Precancerous skin lesion   ? Skin cancer   ? left temple  ? Thyroid disease   ? hypothroidism  ? Vitamin D deficiency   ? ?Past Surgical History:  ?Procedure Laterality Date  ? ABDOMINAL HYSTERECTOMY    ? CATARACT EXTRACTION    ? Dr. TSatira Sark ? SKIN BIOPSY    ? left temple. cancerous  ? TOTAL HIP ARTHROPLASTY  2003  ? left  ? ?Family History  ?Problem Relation Age of Onset  ? Stroke Mother   ? Prostate cancer Father   ?     prostate  ? Hypertension Brother   ? Cancer Maternal Grandmother   ? Hypertension Brother   ? Heart disease Brother   ? Obesity Brother   ? Hypertension Brother   ? Hyperlipidemia Brother   ? Heart disease Brother   ? Kidney disease Brother   ? Cancer Daughter   ?     kidney- spine and blood stream  ? Heart disease Son   ?     valvular heart disease  ? Prostate cancer Brother   ? ?Social History  ? ?Socioeconomic History  ? Marital status: Widowed  ?  Spouse name: Not on file  ? Number of children: 9  ? Years of education: Not on file  ? Highest education level: Not on file  ?Occupational History  ? Occupation: retired  ?  Employer: RETIRED  ?Tobacco Use  ? Smoking status: Former  ?  Packs/day: 0.30  ?  Years: 5.00  ?  Pack years: 1.50  ?  Types: Cigarettes  ?  Quit date: 10/28/1973  ?  Years since quitting: 48.2  ? Smokeless tobacco: Never  ?Substance and Sexual Activity  ? Alcohol use: Yes  ?  Comment: socially  ?  Drug use: No  ? Sexual activity: Not on file  ?Other Topics Concern  ? Not on file  ?Social History Narrative  ? Not on file  ? ?Social Determinants of Health  ? ?Financial Resource Strain: Not on file  ?Food Insecurity: Not on file  ?Transportation Needs: Not on file  ?Physical Activity: Not on file  ?Stress: Not on file  ?Social Connections: Not on file  ? ? ?Tobacco Counseling ?Counseling given: Not Answered ? ? ?Clinical Intake: ? ?Pre-visit preparation  completed: Yes ? ?Pain : No/denies pain ? ?  ? ?Nutritional Risks: None ?Diabetes: No ? ?How often do you need to have someone help you when you read instructions, pamphlets, or other written materials from your doctor or pharmacy?: 1 - Never ? ?Diabetic?No ? ?Interpreter Needed?: No ? ?Information entered by :: Leighann Amadon ? ? ?Activities of Daily Living ?In your present state of health, do you have any difficulty performing the following activities: 01/11/2022  ?Hearing? N  ?Vision? N  ?Difficulty concentrating or making decisions? N  ?Walking or climbing stairs? Y  ?Dressing or bathing? Y  ?Doing errands, shopping? Y  ?Preparing Food and eating ? Y  ?Using the Toilet? Y  ?In the past six months, have you accidently leaked urine? Y  ?Do you have problems with loss of bowel control? Y  ?Managing your Medications? N  ?Managing your Finances? Y  ?Housekeeping or managing your Housekeeping? Y  ?Some recent data might be hidden  ? ? ?Patient Care Team: ?Mosie Lukes, MD as PCP - General (Family Medicine) ?Ralene Bathe, MD as Consulting Physician (Ophthalmology) ?Lelon Perla, MD as Consulting Physician (Cardiology) ? ?Indicate any recent Medical Services you may have received from other than [facility] providers in the past year (date may be approximate). ? ?   ?Assessment:  ? This is a routine wellness examination for [place]. ? ?Hearing/Vision screen ?No results found. ? ?Dietary issues and exercise activities discussed: ?Current Exercise Habits: The  patient does not participate in regular exercise at present, Exercise limited by: None identified ? ? Goals Addressed   ? ?  ?  ?  ?  ? This Visit's Progress  ?  Increase physical activity   On track  ?  Begin chair

## 2022-01-11 ENCOUNTER — Ambulatory Visit (INDEPENDENT_AMBULATORY_CARE_PROVIDER_SITE_OTHER): Payer: Medicare Other

## 2022-01-11 ENCOUNTER — Encounter: Payer: Self-pay | Admitting: Family Medicine

## 2022-01-11 DIAGNOSIS — Z0000 Encounter for general adult medical examination without abnormal findings: Secondary | ICD-10-CM | POA: Diagnosis not present

## 2022-01-11 NOTE — Patient Instructions (Signed)
Gina Reilly , ?Thank you for taking time to come for your Medicare Wellness Visit. I appreciate your ongoing commitment to your health goals. Please review the following plan we discussed and let me know if I can assist you in the future.  ? ?Screening recommendations/referrals: ?Colonoscopy: no longer needed ?Mammogram: declined, bed written ?Bone Density: declined, bed written ?Recommended yearly ophthalmology/optometry visit for glaucoma screening and checkup ?Recommended yearly dental visit for hygiene and checkup ? ?Vaccinations: ?Influenza vaccine: declined, bed written ?Pneumococcal vaccine: declined, bed written ?Tdap vaccine: declined, bed written ?Shingles vaccine:  declined, bed written ?Covid-19:declined, bed written ? ?Advanced directives: yes. On file ? ?Conditions/risks identified: see problem list ? ?Next appointment: Follow up in one year for your annual wellness visit  ? ? ?Preventive Care 17 Years and Older, Female ?Preventive care refers to lifestyle choices and visits with your health care provider that can promote health and wellness. ?What does preventive care include? ?A yearly physical exam. This is also called an annual well check. ?Dental exams once or twice a year. ?Routine eye exams. Ask your health care provider how often you should have your eyes checked. ?Personal lifestyle choices, including: ?Daily care of your teeth and gums. ?Regular physical activity. ?Eating a healthy diet. ?Avoiding tobacco and drug use. ?Limiting alcohol use. ?Practicing safe sex. ?Taking low-dose aspirin every day. ?Taking vitamin and mineral supplements as recommended by your health care provider. ?What happens during an annual well check? ?The services and screenings done by your health care provider during your annual well check will depend on your age, overall health, lifestyle risk factors, and family history of disease. ?Counseling  ?Your health care provider may ask you questions about your: ?Alcohol  use. ?Tobacco use. ?Drug use. ?Emotional well-being. ?Home and relationship well-being. ?Sexual activity. ?Eating habits. ?History of falls. ?Memory and ability to understand (cognition). ?Work and work Statistician. ?Reproductive health. ?Screening  ?You may have the following tests or measurements: ?Height, weight, and BMI. ?Blood pressure. ?Lipid and cholesterol levels. These may be checked every 5 years, or more frequently if you are over 62 years old. ?Skin check. ?Lung cancer screening. You may have this screening every year starting at age 92 if you have a 30-pack-year history of smoking and currently smoke or have quit within the past 15 years. ?Fecal occult blood test (FOBT) of the stool. You may have this test every year starting at age 76. ?Flexible sigmoidoscopy or colonoscopy. You may have a sigmoidoscopy every 5 years or a colonoscopy every 10 years starting at age 90. ?Hepatitis C blood test. ?Hepatitis B blood test. ?Sexually transmitted disease (STD) testing. ?Diabetes screening. This is done by checking your blood sugar (glucose) after you have not eaten for a while (fasting). You may have this done every 1-3 years. ?Bone density scan. This is done to screen for osteoporosis. You may have this done starting at age 35. ?Mammogram. This may be done every 1-2 years. Talk to your health care provider about how often you should have regular mammograms. ?Talk with your health care provider about your test results, treatment options, and if necessary, the need for more tests. ?Vaccines  ?Your health care provider may recommend certain vaccines, such as: ?Influenza vaccine. This is recommended every year. ?Tetanus, diphtheria, and acellular pertussis (Tdap, Td) vaccine. You may need a Td booster every 10 years. ?Zoster vaccine. You may need this after age 32. ?Pneumococcal 13-valent conjugate (PCV13) vaccine. One dose is recommended after age 73. ?Pneumococcal polysaccharide (PPSV23) vaccine.  One dose is  recommended after age 71. ?Talk to your health care provider about which screenings and vaccines you need and how often you need them. ?This information is not intended to replace advice given to you by your health care provider. Make sure you discuss any questions you have with your health care provider. ?Document Released: 11/10/2015 Document Revised: 07/03/2016 Document Reviewed: 08/15/2015 ?[employer]vier Interactive Patient Education ? 2017 [employer]. ? ?Fall Prevention in the Home ?Falls can cause injuries. They can happen to people of all ages. There are many things you can do to make your home safe and to help prevent falls. ?What can I do on the outside of my home? ?Regularly fix the edges of walkways and driveways and fix any cracks. ?Remove anything that might make you trip as you walk through a door, such as a raised step or threshold. ?Trim any bushes or trees on the path to your home. ?Use bright outdoor lighting. ?Clear any walking paths of anything that might make someone trip, such as rocks or tools. ?Regularly check to see if handrails are loose or broken. Make sure that both sides of any steps have handrails. ?Any raised decks and porches should have guardrails on the edges. ?Have any leaves, snow, or ice cleared regularly. ?Use sand or salt on walking paths during winter. ?Clean up any spills in your garage right away. This includes oil or grease spills. ?What can I do in the bathroom? ?Use night lights. ?Install grab bars by the toilet and in the tub and shower. Do not use towel bars as grab bars. ?Use non-skid mats or decals in the tub or shower. ?If you need to sit down in the shower, use a plastic, non-slip stool. ?Keep the floor dry. Clean up any water that spills on the floor as soon as it happens. ?Remove soap buildup in the tub or shower regularly. ?Attach bath mats securely with double-sided non-slip rug tape. ?Do not have throw rugs and other things on the floor that can make you  trip. ?What can I do in the bedroom? ?Use night lights. ?Make sure that you have a light by your bed that is easy to reach. ?Do not use any sheets or blankets that are too big for your bed. They should not hang down onto the floor. ?Have a firm chair that has side arms. You can use this for support while you get dressed. ?Do not have throw rugs and other things on the floor that can make you trip. ?What can I do in the kitchen? ?Clean up any spills right away. ?Avoid walking on wet floors. ?Keep items that you use a lot in easy-to-reach places. ?If you need to reach something above you, use a strong step stool that has a grab bar. ?Keep electrical cords out of the way. ?Do not use floor polish or wax that makes floors slippery. If you must use wax, use non-skid floor wax. ?Do not have throw rugs and other things on the floor that can make you trip. ?What can I do with my stairs? ?Do not leave any items on the stairs. ?Make sure that there are handrails on both sides of the stairs and use them. Fix handrails that are broken or loose. Make sure that handrails are as long as the stairways. ?Check any carpeting to make sure that it is firmly attached to the stairs. Fix any carpet that is loose or worn. ?Avoid having throw rugs at the top or bottom of  the stairs. If you do have throw rugs, attach them to the floor with carpet tape. ?Make sure that you have a light switch at the top of the stairs and the bottom of the stairs. If you do not have them, ask someone to add them for you. ?What else can I do to help prevent falls? ?Wear shoes that: ?Do not have high heels. ?Have rubber bottoms. ?Are comfortable and fit you well. ?Are closed at the toe. Do not wear sandals. ?If you use a stepladder: ?Make sure that it is fully opened. Do not climb a closed stepladder. ?Make sure that both sides of the stepladder are locked into place. ?Ask someone to hold it for you, if possible. ?Clearly mark and make sure that you can  see: ?Any grab bars or handrails. ?First and last steps. ?Where the edge of each step is. ?Use tools that help you move around (mobility aids) if they are needed. These include: ?Canes. ?Walkers. ?Scooters. ?Crutches. ?Turn

## 2022-01-17 ENCOUNTER — Telehealth: Payer: Medicare Other | Admitting: Family Medicine

## 2022-01-23 ENCOUNTER — Other Ambulatory Visit: Payer: Self-pay | Admitting: Family Medicine

## 2022-01-24 NOTE — Telephone Encounter (Signed)
Requesting: tramadol '50mg'$   ?Contract: None ?UDS: 11/25/2014 ?Last Visit: 08/02/21 ?Next Visit: None ?Last Refill: 12/27/21 #180 and 0RF ?Pt sig: 2 tab q8h prn ? ?Please Advise ? ?

## 2022-02-21 ENCOUNTER — Other Ambulatory Visit: Payer: Self-pay | Admitting: Family Medicine

## 2022-02-21 NOTE — Telephone Encounter (Signed)
Requesting: tramadol ?Contract: none ?UDS: none ?Last Visit:08/02/21 ?Next Visit: none ?Last Refill: 01/24/22 ? ?Please Advise ? ?

## 2022-03-04 ENCOUNTER — Other Ambulatory Visit: Payer: Self-pay | Admitting: Cardiology

## 2022-03-28 ENCOUNTER — Other Ambulatory Visit: Payer: Self-pay

## 2022-03-28 ENCOUNTER — Other Ambulatory Visit: Payer: Self-pay | Admitting: Family Medicine

## 2022-03-28 MED ORDER — TRAMADOL HCL 50 MG PO TABS: ORAL_TABLET | ORAL | 0 refills | Status: DC

## 2022-03-28 NOTE — Progress Notes (Unsigned)
Requesting: tramadol 50 MG Contract: none  UDS: none Last Visit: 08/02/21 Next Visit: none Last Refill: 02/21/22  Please Advise

## 2022-04-25 ENCOUNTER — Other Ambulatory Visit: Payer: Self-pay

## 2022-04-25 ENCOUNTER — Other Ambulatory Visit: Payer: Self-pay | Admitting: Family Medicine

## 2022-04-25 MED ORDER — SENNA 8.6 MG PO TABS: ORAL_TABLET | ORAL | 2 refills | Status: AC

## 2022-04-25 MED ORDER — TRAMADOL HCL 50 MG PO TABS
ORAL_TABLET | ORAL | 0 refills | Status: DC
Start: 2022-04-25 — End: 2022-05-23

## 2022-04-25 NOTE — Progress Notes (Unsigned)
Requesting: tramadol '50MG'$  Contract: none UDS: none Last Visit: 08/02/21 Next Visit: none Last Refill: 03/28/22  Please Advise

## 2022-05-20 ENCOUNTER — Encounter: Payer: Self-pay | Admitting: Family Medicine

## 2022-05-22 ENCOUNTER — Encounter: Payer: Self-pay | Admitting: Family Medicine

## 2022-05-22 DIAGNOSIS — R627 Adult failure to thrive: Secondary | ICD-10-CM

## 2022-05-22 DIAGNOSIS — I509 Heart failure, unspecified: Secondary | ICD-10-CM

## 2022-05-23 ENCOUNTER — Other Ambulatory Visit: Payer: Self-pay | Admitting: Family Medicine

## 2022-05-23 ENCOUNTER — Other Ambulatory Visit: Payer: Self-pay

## 2022-05-23 DIAGNOSIS — I509 Heart failure, unspecified: Secondary | ICD-10-CM | POA: Diagnosis not present

## 2022-05-23 DIAGNOSIS — N139 Obstructive and reflux uropathy, unspecified: Secondary | ICD-10-CM | POA: Diagnosis not present

## 2022-05-23 DIAGNOSIS — Z515 Encounter for palliative care: Secondary | ICD-10-CM | POA: Diagnosis not present

## 2022-05-23 DIAGNOSIS — R32 Unspecified urinary incontinence: Secondary | ICD-10-CM | POA: Diagnosis not present

## 2022-05-23 DIAGNOSIS — E0500 Thyrotoxicosis with diffuse goiter without thyrotoxic crisis or storm: Secondary | ICD-10-CM | POA: Diagnosis not present

## 2022-05-23 DIAGNOSIS — I10 Essential (primary) hypertension: Secondary | ICD-10-CM | POA: Diagnosis not present

## 2022-05-23 DIAGNOSIS — I480 Paroxysmal atrial fibrillation: Secondary | ICD-10-CM | POA: Diagnosis not present

## 2022-05-23 MED ORDER — TRAMADOL HCL 50 MG PO TABS: ORAL_TABLET | ORAL | 0 refills | Status: AC

## 2022-05-23 NOTE — Progress Notes (Unsigned)
Requesting: tramadol '50mg'$  Contract: none UDS: none Last Visit: 08/02/21 Next Visit: n/a Last Refill: 04/25/22  Please Advise

## 2022-05-24 DIAGNOSIS — R32 Unspecified urinary incontinence: Secondary | ICD-10-CM | POA: Diagnosis not present

## 2022-05-24 DIAGNOSIS — N139 Obstructive and reflux uropathy, unspecified: Secondary | ICD-10-CM | POA: Diagnosis not present

## 2022-05-24 DIAGNOSIS — I509 Heart failure, unspecified: Secondary | ICD-10-CM | POA: Diagnosis not present

## 2022-05-24 DIAGNOSIS — I10 Essential (primary) hypertension: Secondary | ICD-10-CM | POA: Diagnosis not present

## 2022-05-24 DIAGNOSIS — I480 Paroxysmal atrial fibrillation: Secondary | ICD-10-CM | POA: Diagnosis not present

## 2022-05-28 DIAGNOSIS — Z515 Encounter for palliative care: Secondary | ICD-10-CM | POA: Diagnosis not present

## 2022-05-28 DIAGNOSIS — I10 Essential (primary) hypertension: Secondary | ICD-10-CM | POA: Diagnosis not present

## 2022-05-28 DIAGNOSIS — R32 Unspecified urinary incontinence: Secondary | ICD-10-CM | POA: Diagnosis not present

## 2022-05-28 DIAGNOSIS — N139 Obstructive and reflux uropathy, unspecified: Secondary | ICD-10-CM | POA: Diagnosis not present

## 2022-05-28 DIAGNOSIS — I509 Heart failure, unspecified: Secondary | ICD-10-CM | POA: Diagnosis not present

## 2022-05-28 DIAGNOSIS — I480 Paroxysmal atrial fibrillation: Secondary | ICD-10-CM | POA: Diagnosis not present

## 2022-05-28 DIAGNOSIS — E0500 Thyrotoxicosis with diffuse goiter without thyrotoxic crisis or storm: Secondary | ICD-10-CM | POA: Diagnosis not present

## 2022-05-29 DIAGNOSIS — I509 Heart failure, unspecified: Secondary | ICD-10-CM | POA: Diagnosis not present

## 2022-05-29 DIAGNOSIS — I480 Paroxysmal atrial fibrillation: Secondary | ICD-10-CM | POA: Diagnosis not present

## 2022-05-29 DIAGNOSIS — N139 Obstructive and reflux uropathy, unspecified: Secondary | ICD-10-CM | POA: Diagnosis not present

## 2022-05-29 DIAGNOSIS — I10 Essential (primary) hypertension: Secondary | ICD-10-CM | POA: Diagnosis not present

## 2022-05-29 DIAGNOSIS — R32 Unspecified urinary incontinence: Secondary | ICD-10-CM | POA: Diagnosis not present

## 2022-06-03 DIAGNOSIS — I10 Essential (primary) hypertension: Secondary | ICD-10-CM | POA: Diagnosis not present

## 2022-06-03 DIAGNOSIS — R32 Unspecified urinary incontinence: Secondary | ICD-10-CM | POA: Diagnosis not present

## 2022-06-03 DIAGNOSIS — N139 Obstructive and reflux uropathy, unspecified: Secondary | ICD-10-CM | POA: Diagnosis not present

## 2022-06-03 DIAGNOSIS — I480 Paroxysmal atrial fibrillation: Secondary | ICD-10-CM | POA: Diagnosis not present

## 2022-06-03 DIAGNOSIS — I509 Heart failure, unspecified: Secondary | ICD-10-CM | POA: Diagnosis not present

## 2022-06-07 DIAGNOSIS — I480 Paroxysmal atrial fibrillation: Secondary | ICD-10-CM | POA: Diagnosis not present

## 2022-06-07 DIAGNOSIS — R32 Unspecified urinary incontinence: Secondary | ICD-10-CM | POA: Diagnosis not present

## 2022-06-07 DIAGNOSIS — I10 Essential (primary) hypertension: Secondary | ICD-10-CM | POA: Diagnosis not present

## 2022-06-07 DIAGNOSIS — I509 Heart failure, unspecified: Secondary | ICD-10-CM | POA: Diagnosis not present

## 2022-06-07 DIAGNOSIS — N139 Obstructive and reflux uropathy, unspecified: Secondary | ICD-10-CM | POA: Diagnosis not present

## 2022-06-10 DIAGNOSIS — I10 Essential (primary) hypertension: Secondary | ICD-10-CM | POA: Diagnosis not present

## 2022-06-10 DIAGNOSIS — N139 Obstructive and reflux uropathy, unspecified: Secondary | ICD-10-CM | POA: Diagnosis not present

## 2022-06-10 DIAGNOSIS — R32 Unspecified urinary incontinence: Secondary | ICD-10-CM | POA: Diagnosis not present

## 2022-06-10 DIAGNOSIS — I480 Paroxysmal atrial fibrillation: Secondary | ICD-10-CM | POA: Diagnosis not present

## 2022-06-10 DIAGNOSIS — I509 Heart failure, unspecified: Secondary | ICD-10-CM | POA: Diagnosis not present

## 2022-06-12 DIAGNOSIS — I509 Heart failure, unspecified: Secondary | ICD-10-CM | POA: Diagnosis not present

## 2022-06-12 DIAGNOSIS — R32 Unspecified urinary incontinence: Secondary | ICD-10-CM | POA: Diagnosis not present

## 2022-06-12 DIAGNOSIS — I10 Essential (primary) hypertension: Secondary | ICD-10-CM | POA: Diagnosis not present

## 2022-06-12 DIAGNOSIS — I480 Paroxysmal atrial fibrillation: Secondary | ICD-10-CM | POA: Diagnosis not present

## 2022-06-12 DIAGNOSIS — N139 Obstructive and reflux uropathy, unspecified: Secondary | ICD-10-CM | POA: Diagnosis not present

## 2022-06-28 DEATH — deceased
